# Patient Record
Sex: Female | Born: 1945 | Race: White | Hispanic: No | Marital: Married | Smoking: Never smoker
Health system: Southern US, Community
[De-identification: ages and names within clinical notes are randomized; demographics above are authoritative.]

## PROBLEM LIST (undated history)

## (undated) DIAGNOSIS — M51369 Other intervertebral disc degeneration, lumbar region without mention of lumbar back pain or lower extremity pain: Secondary | ICD-10-CM

## (undated) DIAGNOSIS — E7800 Pure hypercholesterolemia, unspecified: Secondary | ICD-10-CM

## (undated) DIAGNOSIS — Z973 Presence of spectacles and contact lenses: Secondary | ICD-10-CM

## (undated) DIAGNOSIS — E559 Vitamin D deficiency, unspecified: Secondary | ICD-10-CM

## (undated) DIAGNOSIS — K219 Gastro-esophageal reflux disease without esophagitis: Secondary | ICD-10-CM

## (undated) DIAGNOSIS — N819 Female genital prolapse, unspecified: Secondary | ICD-10-CM

## (undated) DIAGNOSIS — M5136 Other intervertebral disc degeneration, lumbar region: Secondary | ICD-10-CM

## (undated) DIAGNOSIS — I639 Cerebral infarction, unspecified: Secondary | ICD-10-CM

## (undated) DIAGNOSIS — J302 Other seasonal allergic rhinitis: Secondary | ICD-10-CM

## (undated) DIAGNOSIS — I10 Essential (primary) hypertension: Secondary | ICD-10-CM

## (undated) DIAGNOSIS — I499 Cardiac arrhythmia, unspecified: Secondary | ICD-10-CM

## (undated) DIAGNOSIS — Z7901 Long term (current) use of anticoagulants: Secondary | ICD-10-CM

## (undated) DIAGNOSIS — R0600 Dyspnea, unspecified: Secondary | ICD-10-CM

## (undated) DIAGNOSIS — M1990 Unspecified osteoarthritis, unspecified site: Secondary | ICD-10-CM

## (undated) DIAGNOSIS — M8580 Other specified disorders of bone density and structure, unspecified site: Secondary | ICD-10-CM

## (undated) DIAGNOSIS — I8390 Asymptomatic varicose veins of unspecified lower extremity: Secondary | ICD-10-CM

## (undated) DIAGNOSIS — E785 Hyperlipidemia, unspecified: Secondary | ICD-10-CM

## (undated) DIAGNOSIS — O039 Complete or unspecified spontaneous abortion without complication: Secondary | ICD-10-CM

## (undated) DIAGNOSIS — Z960 Presence of urogenital implants: Secondary | ICD-10-CM

## (undated) HISTORY — DX: Cardiac arrhythmia, unspecified: I49.9

## (undated) HISTORY — PX: DILATION AND CURETTAGE OF UTERUS: SHX78

## (undated) HISTORY — PX: HERNIA REPAIR: SHX51

---

## 1996-09-13 HISTORY — PX: DILATION AND CURETTAGE OF UTERUS: SHX78

## 2001-04-27 ENCOUNTER — Ambulatory Visit (HOSPITAL_COMMUNITY): Admission: RE | Admit: 2001-04-27 | Discharge: 2001-04-27 | Payer: Self-pay | Admitting: Family Medicine

## 2001-04-27 ENCOUNTER — Encounter: Payer: Self-pay | Admitting: Family Medicine

## 2006-11-02 ENCOUNTER — Inpatient Hospital Stay (HOSPITAL_COMMUNITY): Admission: EM | Admit: 2006-11-02 | Discharge: 2006-11-05 | Payer: Self-pay | Admitting: Pediatrics

## 2006-11-02 HISTORY — PX: FEMORAL HERNIA REPAIR: SHX632

## 2007-02-03 ENCOUNTER — Ambulatory Visit (HOSPITAL_BASED_OUTPATIENT_CLINIC_OR_DEPARTMENT_OTHER): Admission: RE | Admit: 2007-02-03 | Discharge: 2007-02-03 | Payer: Self-pay | Admitting: *Deleted

## 2007-02-03 HISTORY — PX: INGUINAL HERNIA REPAIR: SUR1180

## 2011-01-26 NOTE — Op Note (Signed)
Hannah Downs, Hannah Downs           ACCOUNT NO.:  [id number]   MEDICAL RECORD NO.:  [id number]          PATIENT TYPE:  AMB   LOCATION:  DSC                          FACILITY:  [facility]   PHYSICIAN:  Alfonse Ras, MD   DATE OF BIRTH:  11-10-1945   DATE OF PROCEDURE:  02/03/2007  DATE OF DISCHARGE:  02/03/2007                               OPERATIVE REPORT   PREOPERATIVE DIAGNOSIS:  Recurrent left inguinal hernia status post  primary repair.   POSTOPERATIVE DIAGNOSIS:  Recurrent left inguinal hernia status post  primary repair.   PROCEDURE:  Inguinal hernia repair with mesh.   ANESTHESIA:  General.   SURGEON:  Alfonse Ras, MD.   DESCRIPTION:  The patient was taken to the operating room, placed in a  supine position.  After adequate general anesthesia was induced using  endotracheal tube, the abdomen was prepped and draped in normal sterile  fashion.  Using an oblique incision over the left inguinal canal, I  dissected down onto a very very attenuated external oblique fascia.  This was opened along its fibers.  A direct inguinal hernia defect was  identified.  Transversalis fascia was also incredibly attenuated and was  difficult to do a primary repair but after a relaxing incision I was  able to primarily close this.  The hernia sac was not removed but the  contents were replaced in the abdominal cavity.  Primary repair was  accomplished with interrupted 0 Surgilon sutures.  A piece of 3 x 6  atrium mesh was then tacked with a running 2-0 Prolene suture.  Of  significant note, the tissues were very very thin.  This was run from  medially superiorly along the transversalis muscle and fascia and  brought out over the internal ring and along the inguinal ligament.  Adequate hemostasis was assured.  The external oblique fascia was closed  with a running 3-0 Vicryl suture which was quite poor purchases with the  suture because of the attenuation.  Skin was then closed with  staples.  Marcaine was injected in the tissues and muscle.  A sterile dressing was  applied.  The patient tolerated the procedure well and went to PACU in  good condition.      Alfonse Ras, MD  Electronically Signed     KRE/MEDQ  D:  02/09/2007  T:  02/09/2007  Job:  [id number]

## 2011-01-29 NOTE — H&P (Signed)
Hannah Downs, CRANMORE           ACCOUNT NO.:  [id number]   MEDICAL RECORD NO.:  [id number]          PATIENT TYPE:  INP   LOCATION:  5743                         FACILITY:  [facility]   PHYSICIAN:  Alfonse Ras, MD   DATE OF BIRTH:  09-06-1946   DATE OF ADMISSION:  11/02/2006  DATE OF DISCHARGE:                              HISTORY & PHYSICAL   ADMISSION DIAGNOSIS:  Small bowel obstruction secondary to incarcerated  left inguinal hernia.   CHIEF COMPLAINT:  The patient has had nausea and vomiting for about 5  days.  Has had a known left inguinal hernia, which has not been treated.   HOSPITAL COURSE:  The patient presented to the emergency room and was  found to have a white count of 18,000.  She underwent a CT scan, which  showed dilated loops of small bowel as well as left inguinal hernia.  Under conscious sedation, I tried to reduce this and was unsuccessful.  For this reason, the patient is being admitted for operative  intervention, left inguinal hernia repair, and possible small bowel  resection.   PAST MEDICAL HISTORY:  Significant only for hypertension and the birth  of 40 pregnancy and the birth of 81 children.   PAST SURGICAL HISTORY:  None.   MEDICATIONS:  Include only lisinopril at an unknown dose.   PHYSICAL EXAMINATION:  GENERAL:  She is age appropriate, white female in  minimal distress.  ABDOMEN:  Her abdomen is minimally distended and nontender.  She does  have a left inguinal hernia, which is easily seen.  HEENT:  Benign.  Normocephalic, atraumatic.  Pupils equal, round, and  reactive to light.  NECK:  Supple, soft.  No thyromegaly or cervical adenopathy.  LUNGS:  Clear to auscultation and percussion.  HEART:  Regular rate and rhythm without murmurs, rubs, or gallops.  EXTREMITIES:  Show no clubbing, cyanosis, or edema.  VITAL SIGNS:  Temperature is 98.7, heart rate was 105, respiratory rate  was 16, and blood pressure was 142/70.   IMPRESSION:   Incarcerated left inguinal hernia, unable to reduce.   PLAN:  Left inguinal hernia repair with possible small bowel resection.      Alfonse Ras, MD  Electronically Signed     KRE/MEDQ  D:  11/02/2006  T:  11/03/2006  Job:  [phone]

## 2011-01-29 NOTE — Op Note (Signed)
Hannah Downs, DENARDO           ACCOUNT NO.:  [id number]   MEDICAL RECORD NO.:  [id number]          PATIENT TYPE:  EMS   LOCATION:  MAJO                         FACILITY:  [facility]   PHYSICIAN:  Alfonse Ras, MD   DATE OF BIRTH:  September 19, 1945   DATE OF PROCEDURE:  11/02/2006  DATE OF DISCHARGE:                               OPERATIVE REPORT   PREOPERATIVE DIAGNOSIS:  Incarcerated inguinal hernia.   POSTOPERATIVE DIAGNOSIS:  Incarcerated left femoral hernia.   PROCEDURE:  Inguinal exploration and the primary repair of femoral  hernia, reduction of small bowel obstruction.  R.   ANESTHESIA:  General.   DESCRIPTION:  The patient was taken to the operating room, placed in the  supine position.  After adequate general anesthesia was induced using  endotracheal tube the abdomen was prepped and draped in normal sterile  fashion including the left inguinal region.  An oblique incision was  made over the inguinal canal.  I dissected down to the external oblique  fascia which was opened.  A fairly large incarcerated hernia sac was  identified.  On further dissection, the defect was very small only  allowing about a fingerbreadth and it was just medial to the femoral  vein consistent with a femoral hernia.  After extending the fascial  incision laterally and superiorly, I was able to inspect the small bowel  after removing the hernia sac and it was viable.  I reduced this into  the abdominal cavity and it was held in place with a sponge stick.  Fascia was then reapproximated down to Cooper's ligament starting at the  femoral vein and going towards the pubic tubercle.  This closed the  space well.  Because of the cloudy fluid within the hernia sac, I opted  not to place mesh.  The subcutaneous tissue was closed with a running 3-  0 Vicryl.  Skin incision was closed with staples.  The patient tolerated  the procedure well, went to PACU in good condition.      Alfonse Ras, MD  Electronically Signed     KRE/MEDQ  D:  11/02/2006  T:  11/02/2006  Job:  [id number]

## 2011-01-29 NOTE — Discharge Summary (Signed)
Hannah Downs, Hannah Downs           ACCOUNT NO.:  [id number]   MEDICAL RECORD NO.:  [id number]          PATIENT TYPE:  INP   LOCATION:  5743                         FACILITY:  [facility]   PHYSICIAN:  Alfonse Ras, MD   DATE OF BIRTH:  02-22-1946   DATE OF ADMISSION:  11/01/2006  DATE OF DISCHARGE:  11/05/2006                               DISCHARGE SUMMARY   CHIEF COMPLAINT/REASON FOR ADMISSION:  Mrs. Sferrazza is a 61-year female  patient with nausea and vomiting for 5 days, known left inguinal hernia.  She presented to the ER because of pain, nausea and vomiting and was  found to have a white count of 18,000.  CT scan was done which  demonstrated dilated loops of small bowel and a left inguinal hernia.  There was an attempt made to reduce this hernia under conscious sedation  in the ER but was unsuccessful.  The patient subsequently was admitted  for incarcerated inguinal hernia with plans to take to the OR on day of  admission for repair.   HOSPITAL COURSE:  The patient was taken from the ER to the OR by Dr.  Colin Benton for small bowel obstruction secondary to incarcerated inguinal  hernia.  Postoperative diagnosis was the same but she was actually found  to have a femoral hernia.  She underwent primary repair.  Bowel was  viable.  No bowel resection anticipated.   Postoperative day #1, the patient had an NG tube in place.  White count  had normalized from 18,500 now to 8400, hemoglobin was stable.  BUN and  creatinine was stable.  The patient was hungry and wanted her NG tube  out.  Her abdomen was soft.  Bowel sounds were present.  Left groin  dressing was clean, dry and intact.  NG tube with bilious drainage.  Her  NG tube was discontinued.  Her Foley was discontinued.  The Rocephin was  discontinued.  She had also been placed on empiric Unasyn.  She was  started on clear liquid diet to advance as tolerated with plans to begin  changing her over from PCA to oral pain medicines when  tolerating a  diet.   By postoperative day #2, the patient was stable.  She was passing a lot  of flatus but no BMs.  She tolerated a clear liquid diet.  Incision was  unremarkable.  A urine culture had been obtained at admission and this  was positive for greater than 100,000 colonies of Proteus mirabilis  sensitive to ampicillin, Rocephin and Cipro.  This patient was continued  on Unasyn and Cipro was added for UTI.  Her diet was advanced and plans  were to probably discharge the patient home on November 05, 2006.  On  that day, the patient was stable.  Wound was clean, dry and intact.  She  was tolerating a diet and deemed appropriate for discharge home.   FINAL DISCHARGE DIAGNOSES:  1. Small bowel obstruction secondary to incarcerated femoral hernia.  2. Primary repair of hernia not involving bowel resection.  3. Proteus urinary tract infection, pansensitive.   DISCHARGE MEDICATIONS:  1. Vicodin 5/325  1-2 tablets every 4 hours as needed for pain.  2. Cipro 500 mg b.i.d. for 6 days.  3. Resume home medications lisinopril 20/12.5 daily.   DIET:  Heart-healthy.   WOUND CARE:  Pat staples dry.   ACTIVITY:  Increase activity slowly.  May walk up steps.  May shower.  No lifting for 6 weeks greater than 15 pounds.  No driving while taking  Vicodin.   FOLLOW-UP APPOINTMENT:  Need to follow up with Dr. Colin Benton on Thursday,  November 10, 2006 at 10:30 a.m. for staple removal on postoperative  visit.      Allison L. Rolene Course      Alfonse Ras, MD  Electronically Signed    ALE/MEDQ  D:  12/16/2006  T:  12/17/2006  Job:  [id number]

## 2012-01-31 DIAGNOSIS — I10 Essential (primary) hypertension: Secondary | ICD-10-CM | POA: Diagnosis not present

## 2012-01-31 DIAGNOSIS — Z1331 Encounter for screening for depression: Secondary | ICD-10-CM | POA: Diagnosis not present

## 2012-01-31 DIAGNOSIS — K219 Gastro-esophageal reflux disease without esophagitis: Secondary | ICD-10-CM | POA: Diagnosis not present

## 2012-01-31 DIAGNOSIS — J309 Allergic rhinitis, unspecified: Secondary | ICD-10-CM | POA: Diagnosis not present

## 2012-10-03 DIAGNOSIS — K219 Gastro-esophageal reflux disease without esophagitis: Secondary | ICD-10-CM | POA: Diagnosis not present

## 2012-10-03 DIAGNOSIS — E785 Hyperlipidemia, unspecified: Secondary | ICD-10-CM | POA: Diagnosis not present

## 2012-10-03 DIAGNOSIS — I10 Essential (primary) hypertension: Secondary | ICD-10-CM | POA: Diagnosis not present

## 2012-10-03 DIAGNOSIS — J309 Allergic rhinitis, unspecified: Secondary | ICD-10-CM | POA: Diagnosis not present

## 2012-10-03 DIAGNOSIS — R7301 Impaired fasting glucose: Secondary | ICD-10-CM | POA: Diagnosis not present

## 2013-03-15 DIAGNOSIS — IMO0002 Reserved for concepts with insufficient information to code with codable children: Secondary | ICD-10-CM | POA: Diagnosis not present

## 2013-03-15 DIAGNOSIS — K5289 Other specified noninfective gastroenteritis and colitis: Secondary | ICD-10-CM | POA: Diagnosis not present

## 2013-04-16 DIAGNOSIS — K219 Gastro-esophageal reflux disease without esophagitis: Secondary | ICD-10-CM | POA: Diagnosis not present

## 2013-04-16 DIAGNOSIS — I10 Essential (primary) hypertension: Secondary | ICD-10-CM | POA: Diagnosis not present

## 2013-04-16 DIAGNOSIS — R7301 Impaired fasting glucose: Secondary | ICD-10-CM | POA: Diagnosis not present

## 2013-04-16 DIAGNOSIS — Z1331 Encounter for screening for depression: Secondary | ICD-10-CM | POA: Diagnosis not present

## 2013-11-27 DIAGNOSIS — K219 Gastro-esophageal reflux disease without esophagitis: Secondary | ICD-10-CM | POA: Diagnosis not present

## 2013-11-27 DIAGNOSIS — R7309 Other abnormal glucose: Secondary | ICD-10-CM | POA: Diagnosis not present

## 2013-11-27 DIAGNOSIS — M5137 Other intervertebral disc degeneration, lumbosacral region: Secondary | ICD-10-CM | POA: Diagnosis not present

## 2013-11-27 DIAGNOSIS — M25559 Pain in unspecified hip: Secondary | ICD-10-CM | POA: Diagnosis not present

## 2013-11-27 DIAGNOSIS — J309 Allergic rhinitis, unspecified: Secondary | ICD-10-CM | POA: Diagnosis not present

## 2013-11-27 DIAGNOSIS — I10 Essential (primary) hypertension: Secondary | ICD-10-CM | POA: Diagnosis not present

## 2013-11-28 ENCOUNTER — Other Ambulatory Visit: Payer: Self-pay | Admitting: Family Medicine

## 2013-11-28 ENCOUNTER — Ambulatory Visit
Admission: RE | Admit: 2013-11-28 | Discharge: 2013-11-28 | Disposition: A | Discharge status: Home / Self Care | Payer: Medicare Other | Source: Ambulatory Visit | Attending: Family Medicine | Admitting: Family Medicine

## 2013-11-28 DIAGNOSIS — M25559 Pain in unspecified hip: Secondary | ICD-10-CM

## 2013-11-28 DIAGNOSIS — M1610 Unilateral primary osteoarthritis, unspecified hip: Secondary | ICD-10-CM | POA: Diagnosis not present

## 2013-11-28 DIAGNOSIS — M169 Osteoarthritis of hip, unspecified: Secondary | ICD-10-CM | POA: Diagnosis not present

## 2014-01-03 DIAGNOSIS — M169 Osteoarthritis of hip, unspecified: Secondary | ICD-10-CM | POA: Diagnosis not present

## 2014-01-03 DIAGNOSIS — M1610 Unilateral primary osteoarthritis, unspecified hip: Secondary | ICD-10-CM | POA: Diagnosis not present

## 2014-03-07 DIAGNOSIS — M1610 Unilateral primary osteoarthritis, unspecified hip: Secondary | ICD-10-CM | POA: Diagnosis not present

## 2014-05-02 DIAGNOSIS — E559 Vitamin D deficiency, unspecified: Secondary | ICD-10-CM | POA: Diagnosis not present

## 2014-05-02 DIAGNOSIS — Z79899 Other long term (current) drug therapy: Secondary | ICD-10-CM | POA: Diagnosis not present

## 2014-05-02 DIAGNOSIS — Z1331 Encounter for screening for depression: Secondary | ICD-10-CM | POA: Diagnosis not present

## 2014-05-02 DIAGNOSIS — Z01810 Encounter for preprocedural cardiovascular examination: Secondary | ICD-10-CM | POA: Diagnosis not present

## 2014-05-02 DIAGNOSIS — K219 Gastro-esophageal reflux disease without esophagitis: Secondary | ICD-10-CM | POA: Diagnosis not present

## 2014-05-02 DIAGNOSIS — Z23 Encounter for immunization: Secondary | ICD-10-CM | POA: Diagnosis not present

## 2014-05-02 DIAGNOSIS — M1610 Unilateral primary osteoarthritis, unspecified hip: Secondary | ICD-10-CM | POA: Diagnosis not present

## 2014-05-02 DIAGNOSIS — Z01818 Encounter for other preprocedural examination: Secondary | ICD-10-CM | POA: Diagnosis not present

## 2014-05-02 DIAGNOSIS — R7309 Other abnormal glucose: Secondary | ICD-10-CM | POA: Diagnosis not present

## 2014-05-02 DIAGNOSIS — M169 Osteoarthritis of hip, unspecified: Secondary | ICD-10-CM | POA: Diagnosis not present

## 2014-05-02 DIAGNOSIS — I10 Essential (primary) hypertension: Secondary | ICD-10-CM | POA: Diagnosis not present

## 2014-05-30 ENCOUNTER — Other Ambulatory Visit: Payer: Self-pay | Admitting: Orthopedic Surgery

## 2014-05-30 NOTE — Progress Notes (Signed)
Preoperative surgical orders have been place into the Epic hospital system for Hannah Downs on 05/30/2014, 1:49 PM  by Patrica Duel for surgery on 06/26/2014.  Preop Total Hip - Anterior Approach orders including Experel Injecion, IV Tylenol, and IV Decadron as long as there are no contraindications to the above medications. Avel Peace, PA-C

## 2014-06-13 ENCOUNTER — Encounter (HOSPITAL_COMMUNITY): Payer: Self-pay | Admitting: Pharmacy Technician

## 2014-06-18 ENCOUNTER — Ambulatory Visit (HOSPITAL_COMMUNITY)
Admission: RE | Admit: 2014-06-18 | Discharge: 2014-06-18 | Disposition: A | Discharge status: Home / Self Care | Payer: Medicare Other | Source: Ambulatory Visit | Attending: Orthopedic Surgery | Admitting: Orthopedic Surgery

## 2014-06-18 ENCOUNTER — Encounter (HOSPITAL_COMMUNITY): Payer: Self-pay

## 2014-06-18 ENCOUNTER — Encounter (HOSPITAL_COMMUNITY)
Admission: RE | Admit: 2014-06-18 | Discharge: 2014-06-18 | Disposition: A | Discharge status: Home / Self Care | Payer: Medicare Other | Source: Ambulatory Visit | Attending: Orthopedic Surgery | Admitting: Orthopedic Surgery

## 2014-06-18 DIAGNOSIS — M160 Bilateral primary osteoarthritis of hip: Secondary | ICD-10-CM | POA: Diagnosis not present

## 2014-06-18 DIAGNOSIS — M1611 Unilateral primary osteoarthritis, right hip: Secondary | ICD-10-CM | POA: Diagnosis not present

## 2014-06-18 DIAGNOSIS — Z01818 Encounter for other preprocedural examination: Secondary | ICD-10-CM | POA: Insufficient documentation

## 2014-06-18 DIAGNOSIS — I10 Essential (primary) hypertension: Secondary | ICD-10-CM | POA: Diagnosis not present

## 2014-06-18 HISTORY — DX: Asymptomatic varicose veins of unspecified lower extremity: I83.90

## 2014-06-18 HISTORY — DX: Unspecified osteoarthritis, unspecified site: M19.90

## 2014-06-18 HISTORY — DX: Other intervertebral disc degeneration, lumbar region without mention of lumbar back pain or lower extremity pain: M51.369

## 2014-06-18 HISTORY — DX: Other seasonal allergic rhinitis: J30.2

## 2014-06-18 HISTORY — DX: Pure hypercholesterolemia, unspecified: E78.00

## 2014-06-18 HISTORY — DX: Other specified disorders of bone density and structure, unspecified site: M85.80

## 2014-06-18 HISTORY — DX: Other intervertebral disc degeneration, lumbar region: M51.36

## 2014-06-18 HISTORY — DX: Gastro-esophageal reflux disease without esophagitis: K21.9

## 2014-06-18 HISTORY — DX: Essential (primary) hypertension: I10

## 2014-06-18 HISTORY — DX: Vitamin D deficiency, unspecified: E55.9

## 2014-06-18 HISTORY — DX: Complete or unspecified spontaneous abortion without complication: O03.9

## 2014-06-18 LAB — COMPREHENSIVE METABOLIC PANEL
ALK PHOS: 59 U/L (ref 39–117)
ALT: 12 U/L (ref 0–35)
ANION GAP: 14 (ref 5–15)
AST: 20 U/L (ref 0–37)
Albumin: 4.6 g/dL (ref 3.5–5.2)
BILIRUBIN TOTAL: 1.5 mg/dL — AB (ref 0.3–1.2)
BUN: 16 mg/dL (ref 6–23)
CHLORIDE: 91 meq/L — AB (ref 96–112)
CO2: 28 mEq/L (ref 19–32)
Calcium: 10.2 mg/dL (ref 8.4–10.5)
Creatinine, Ser: 0.5 mg/dL (ref 0.50–1.10)
GFR calc Af Amer: >90 mL/min (ref >90)
GFR calc non Af Amer: >90 mL/min (ref >90)
GLUCOSE: 101 mg/dL — AB (ref 70–99)
POTASSIUM: 3.8 meq/L (ref 3.7–5.3)
Sodium: 133 mEq/L — ABNORMAL LOW (ref 137–147)
Total Protein: 7.8 g/dL (ref 6.0–8.3)

## 2014-06-18 LAB — CBC
HCT: 39 % (ref 36.0–46.0)
HEMOGLOBIN: 13.5 g/dL (ref 12.0–15.0)
MCH: 30.3 pg (ref 26.0–34.0)
MCHC: 34.6 g/dL (ref 30.0–36.0)
MCV: 87.4 fL (ref 78.0–100.0)
Platelets: 303 10*3/uL (ref 150–400)
RBC: 4.46 MIL/uL (ref 3.87–5.11)
RDW: 12.1 % (ref 11.5–15.5)
WBC: 7.5 10*3/uL (ref 4.0–10.5)

## 2014-06-18 LAB — URINALYSIS, ROUTINE W REFLEX MICROSCOPIC
Bilirubin Urine: NEGATIVE
Glucose, UA: NEGATIVE mg/dL
HGB URINE DIPSTICK: NEGATIVE
Ketones, ur: 15 mg/dL — AB
Nitrite: POSITIVE — AB
Protein, ur: NEGATIVE mg/dL
SPECIFIC GRAVITY, URINE: 1.021 (ref 1.005–1.030)
UROBILINOGEN UA: 1 mg/dL (ref 0.0–1.0)
pH: 6 (ref 5.0–8.0)

## 2014-06-18 LAB — URINE MICROSCOPIC-ADD ON

## 2014-06-18 LAB — SURGICAL PCR SCREEN
MRSA, PCR: NEGATIVE
STAPHYLOCOCCUS AUREUS: POSITIVE — AB

## 2014-06-18 LAB — PROTIME-INR
INR: 1.07 (ref 0.00–1.49)
Prothrombin Time: 14 seconds (ref 11.6–15.2)

## 2014-06-18 LAB — APTT: aPTT: 32 seconds (ref 24–37)

## 2014-06-18 LAB — ABO/RH: ABO/RH(D): O POS

## 2014-06-18 NOTE — Progress Notes (Signed)
EKG on chart from 05/02/2014  Clearance note on chart 05/02/2014 per Dr Cliffton AstersWhite

## 2014-06-18 NOTE — Progress Notes (Signed)
CMP results per PAT visit on 06/18/2014 / epic sent to Dr Lequita HaltAluisio

## 2014-06-18 NOTE — Patient Instructions (Addendum)
20 Hannah Downs  06/18/2014   Your procedure is scheduled on:      06/26/2014  Report to [facility] Main Entrance and follow signs to  Short Stay Center arrive at 11:00 AM.  Call this number if you have problems the morning of surgery [phone] or Presurgical Testing [phone].   Remember:  Do not eat food After Midnight but may have clear liquids till 0730 am day of surgery then nothing by mouth.    Take these medicines the morning of surgery with A SIP OF WATER: Pantoprazole                               You may not have any metal on your body including hair pins and piercings  Do not wear jewelry, make-up, lotions, powders, or deodorant.  Do not shave body hair  48 hours(2 days) of CHG soap use.               Do not bring valuables to the hospital. [facility] IS NOT RESPONSIBLE FOR VALUABLES.  Contacts, dentures or bridgework may not be worn into surgery.  Leave suitcase in the car. After surgery it may be brought to your room.  For patients admitted to the hospital, checkout time is 11:00 AM the day of discharge.     Special Instructions: review fact sheets for MRSA information, Blood Transfusion fact sheet, Incentive Spirometry.  Remember: Type/Screen "Blue armsbands"- may not be removed once applied(would result in being retested AM of surgery, if removed). ________________________________________________________________________  [facility] - Preparing for Surgery Before surgery, you can play an important role.  Because skin is not sterile, your skin needs to be as free of germs as possible.  You can reduce the number of germs on your skin by washing with CHG (chlorahexidine gluconate) soap before surgery.  CHG is an antiseptic cleaner which kills germs and bonds with the skin to continue killing germs even after washing. Please DO NOT use if you have an allergy to CHG or antibacterial soaps.  If your skin becomes reddened/irritated stop using the CHG and  inform your nurse when you arrive at Short Stay. Do not shave (including legs and underarms) for at least 48 hours prior to the first CHG shower.  You may shave your face/neck. Please follow these instructions carefully:  1.  Shower with CHG Soap the night before surgery and the  morning of Surgery.  2.  If you choose to wash your hair, wash your hair first as usual with your  normal  shampoo.  3.  After you shampoo, rinse your hair and body thoroughly to remove the  shampoo.                           4.  Use CHG as you would any other liquid soap.  You can apply chg directly  to the skin and wash                       Gently with a scrungie or clean washcloth.  5.  Apply the CHG Soap to your body ONLY FROM THE NECK DOWN.   Do not use on face/ open                           Wound or open sores. Avoid contact  with eyes, ears mouth and genitals (private parts).                       Wash face,  Genitals (private parts) with your normal soap.             6.  Wash thoroughly, paying special attention to the area where your surgery  will be performed.  7.  Thoroughly rinse your body with warm water from the neck down.  8.  DO NOT shower/wash with your normal soap after using and rinsing off  the CHG Soap.                9.  Pat yourself dry with a clean towel.            10.  Wear clean pajamas.            11.  Place clean sheets on your bed the night of your first shower and do not  sleep with pets. Day of Surgery : Do not apply any lotions/deodorants the morning of surgery.  Please wear clean clothes to the hospital/surgery center.  FAILURE TO FOLLOW THESE INSTRUCTIONS MAY RESULT IN THE CANCELLATION OF YOUR SURGERY PATIENT SIGNATURE_________________________________  NURSE SIGNATURE__________________________________  ________________________________________________________________________    CLEAR LIQUID DIET   Foods Allowed                                                                      Foods Excluded  Coffee and tea, regular and decaf                             liquids that you cannot  Plain Jell-O in any flavor                                             see through such as: Fruit ices (not with fruit pulp)                                     milk, soups, orange juice  Iced Popsicles                                    All solid food Carbonated beverages, regular and diet                                    Cranberry, grape and apple juices Sports drinks like Gatorade Lightly seasoned clear broth or consume(fat free) Sugar, honey syrup  Sample Menu Breakfast                                Lunch  Supper Cranberry juice                    Beef broth                            Chicken broth Jell-O                                     Grape juice                           Apple juice Coffee or tea                        Jell-O                                      Popsicle                                                Coffee or tea                        Coffee or tea  _____________________________________________________________________    Incentive Spirometer  An incentive spirometer is a tool that can help keep your lungs clear and active. This tool measures how well you are filling your lungs with each breath. Taking long deep breaths may help reverse or decrease the chance of developing breathing (pulmonary) problems (especially infection) following:  A long period of time when you are unable to move or be active. BEFORE THE PROCEDURE   If the spirometer includes an indicator to show your best effort, your nurse or respiratory therapist will set it to a desired goal.  If possible, sit up straight or lean slightly forward. Try not to slouch.  Hold the incentive spirometer in an upright position. INSTRUCTIONS FOR USE  1. Sit on the edge of your bed if possible, or sit up as far as you can in bed or on a chair. 2. Hold the  incentive spirometer in an upright position. 3. Breathe out normally. 4. Place the mouthpiece in your mouth and seal your lips tightly around it. 5. Breathe in slowly and as deeply as possible, raising the piston or the ball toward the top of the column. 6. Hold your breath for 3-5 seconds or for as long as possible. Allow the piston or ball to fall to the bottom of the column. 7. Remove the mouthpiece from your mouth and breathe out normally. 8. Rest for a few seconds and repeat Steps 1 through 7 at least 10 times every 1-2 hours when you are awake. Take your time and take a few normal breaths between deep breaths. 9. The spirometer may include an indicator to show your best effort. Use the indicator as a goal to work toward during each repetition. 10. After each set of 10 deep breaths, practice coughing to be sure your lungs are clear. If you have an incision (the cut made at the time of surgery), support your incision when coughing by placing a pillow or rolled up towels firmly against  it. Once you are able to get out of bed, walk around indoors and cough well. You may stop using the incentive spirometer when instructed by your caregiver.  RISKS AND COMPLICATIONS  Take your time so you do not get dizzy or light-headed.  If you are in pain, you may need to take or ask for pain medication before doing incentive spirometry. It is harder to take a deep breath if you are having pain. AFTER USE  Rest and breathe slowly and easily.  It can be helpful to keep track of a log of your progress. Your caregiver can provide you with a simple table to help with this. If you are using the spirometer at home, follow these instructions: SEEK MEDICAL CARE IF:   You are having difficultly using the spirometer.  You have trouble using the spirometer as often as instructed.  Your pain medication is not giving enough relief while using the spirometer.  You develop fever of 100.5 F (38.1 C) or  higher. SEEK IMMEDIATE MEDICAL CARE IF:   You cough up bloody sputum that had not been present before.  You develop fever of 102 F (38.9 C) or greater.  You develop worsening pain at or near the incision site. MAKE SURE YOU:   Understand these instructions.  Will watch your condition.  Will get help right away if you are not doing well or get worse. Document Released: 01/10/2007 Document Revised: 11/22/2011 Document Reviewed: 03/13/2007 ExitCare Patient Information 2014 ExitCare, [place].   ________________________________________________________________________  WHAT IS A BLOOD TRANSFUSION? Blood Transfusion Information  A transfusion is the replacement of blood or some of its parts. Blood is made up of multiple cells which provide different functions.  Red blood cells carry oxygen and are used for blood loss replacement.  White blood cells fight against infection.  Platelets control bleeding.  Plasma helps clot blood.  Other blood products are available for specialized needs, such as hemophilia or other clotting disorders. BEFORE THE TRANSFUSION  Who gives blood for transfusions?   Healthy volunteers who are fully evaluated to make sure their blood is safe. This is blood bank blood. Transfusion therapy is the safest it has ever been in the practice of medicine. Before blood is taken from a donor, a complete history is taken to make sure that person has no history of diseases nor engages in risky social behavior (examples are intravenous drug use or sexual activity with multiple partners). The donor's travel history is screened to minimize risk of transmitting infections, such as malaria. The donated blood is tested for signs of infectious diseases, such as HIV and hepatitis. The blood is then tested to be sure it is compatible with you in order to minimize the chance of a transfusion reaction. If you or a relative donates blood, this is often done in anticipation of surgery  and is not appropriate for emergency situations. It takes many days to process the donated blood. RISKS AND COMPLICATIONS Although transfusion therapy is very safe and saves many lives, the main dangers of transfusion include:   Getting an infectious disease.  Developing a transfusion reaction. This is an allergic reaction to something in the blood you were given. Every precaution is taken to prevent this. The decision to have a blood transfusion has been considered carefully by your caregiver before blood is given. Blood is not given unless the benefits outweigh the risks. AFTER THE TRANSFUSION  Right after receiving a blood transfusion, you will usually feel much better and more  energetic. This is especially true if your red blood cells have gotten low (anemic). The transfusion raises the level of the red blood cells which carry oxygen, and this usually causes an energy increase.  The nurse administering the transfusion will monitor you carefully for complications. HOME CARE INSTRUCTIONS  No special instructions are needed after a transfusion. You may find your energy is better. Speak with your caregiver about any limitations on activity for underlying diseases you may have. SEEK MEDICAL CARE IF:   Your condition is not improving after your transfusion.  You develop redness or irritation at the intravenous (IV) site. SEEK IMMEDIATE MEDICAL CARE IF:  Any of the following symptoms occur over the next 12 hours:  Shaking chills.  You have a temperature by mouth above 102 F (38.9 C), not controlled by medicine.  Chest, back, or muscle pain.  People around you feel you are not acting correctly or are confused.  Shortness of breath or difficulty breathing.  Dizziness and fainting.  You get a rash or develop hives.  You have a decrease in urine output.  Your urine turns a dark color or changes to pink, red, or brown. Any of the following symptoms occur over the next 10  days:  You have a temperature by mouth above 102 F (38.9 C), not controlled by medicine.  Shortness of breath.  Weakness after normal activity.  The white part of the eye turns yellow (jaundice).  You have a decrease in the amount of urine or are urinating less often.  Your urine turns a dark color or changes to pink, red, or brown. Document Released: 08/27/2000 Document Revised: 11/22/2011 Document Reviewed: 04/15/2008 [facility] Patient Information 2014 [place], [place].  _______________________________________________________________________

## 2014-06-18 NOTE — Progress Notes (Signed)
Urinalysis and micro results per PAT visit on 06/18/2014 / epic sent to Dr Lequita HaltAluisio

## 2014-06-25 ENCOUNTER — Other Ambulatory Visit: Payer: Self-pay | Admitting: Surgical

## 2014-06-25 NOTE — H&P (Signed)
TOTAL HIP ADMISSION H&P  Patient is admitted for right total hip arthroplasty.  Subjective:  Chief Complaint: right hip pain  HPI: Hannah Downs, 68 y.o. female, has a history of pain and functional disability in the right hip(s) due to arthritis and patient has failed non-surgical conservative treatments for greater than 12 weeks to include NSAID's and/or analgesics, corticosteriod injections, flexibility and strengthening excercises, use of assistive devices and activity modification.  Onset of symptoms was gradual starting 2 years ago with gradually worsening course since that time.The patient noted no past surgery on the right hip(s).  Patient currently rates pain in the right hip at 8 out of 10 with activity. Patient has night pain, worsening of pain with activity and weight bearing, pain that interfers with activities of daily living, pain with passive range of motion and crepitus. 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 by imaging studies. This condition presents safety issues increasing the risk of falls.  There is no current active infection.   Past Medical History  Diagnosis Date  . Hypertension   . GERD (gastroesophageal reflux disease)   . Arthritis   . Varicose vein     right leg  . Miscarriage     times 3  . Osteopenia   . Vitamin D deficiency   . Lumbar degenerative disc disease   . Hypercholesterolemia   . Seasonal allergies     Past Surgical History  Procedure Laterality Date  . Dilation and curettage of uterus      hx of approx 26 years ago  . Hernia repair      hx of in left groin times 2    Current outpatient prescriptions: atenolol-chlorthalidone (TENORETIC) 50-25 MG per tablet, Take 1 tablet by mouth every morning., Disp: , Rfl: ;   cetirizine (ZYRTEC) 10 MG tablet, Take 10 mg by mouth at bedtime., Disp: , Rfl: ;   cholecalciferol (VITAMIN D) 1000  UNITS tablet, Take 2,000 Units by mouth 2 (two) times daily., Disp: , Rfl: ;   ibuprofen (ADVIL,MOTRIN) 200 MG tablet, Take 600 mg by mouth 3 (three) times daily., Disp: , Rfl:  montelukast (SINGULAIR) 10 MG tablet, Take 10 mg by mouth at bedtime., Disp: , Rfl: ;   pantoprazole (PROTONIX) 40 MG tablet, Take 40 mg by mouth 2 (two) times daily., Disp: , Rfl:   No Known Allergies  History  Substance Use Topics  . Smoking status: Never Smoker   . Smokeless tobacco: Never Used  . Alcohol Use: No      Review of Systems  Constitutional: Negative.   HENT: Negative.   Eyes: Negative.   Respiratory: Negative.   Cardiovascular: Negative.   Gastrointestinal: Negative.   Genitourinary: Negative.   Musculoskeletal: Positive for back pain, joint pain and myalgias. Negative for falls and neck pain.       Right hip pain  Skin: Negative.   Neurological: Negative.   Endo/Heme/Allergies: Negative.   Psychiatric/Behavioral: Negative.     Objective:  Physical Exam  Constitutional: She is oriented to person, place, and time. She appears well-developed and well-nourished. No distress.  HENT:  Head: Normocephalic and atraumatic.  Right Ear: External ear normal.  Left Ear: External ear normal.  Nose: Nose normal.  Mouth/Throat: Oropharynx is clear and moist.  Eyes: Conjunctivae and EOM are normal.  Neck: Normal range of motion. Neck supple.  Cardiovascular: Normal rate, regular rhythm, normal heart sounds and intact  distal pulses.   No murmur heard. Respiratory: Effort normal and breath sounds normal. No respiratory distress. She has no wheezes.  GI: Soft. Bowel sounds are normal.  Musculoskeletal:       Right hip: She exhibits decreased range of motion, decreased strength and crepitus.       Left hip: Normal.       Right knee: Normal.       Left knee: Normal.       Right lower leg: She exhibits no tenderness and no swelling.       Left lower leg: She exhibits no tenderness and no swelling.   Her right hip can be flexed to about 90 minimal internal rotation, to about 20 external rotation, and 20 abduction. She has no tenderness about the hip. Her left hip has normal range of motion. Knee exam is unremarkable. Significant antalgic gait.  Neurological: She is alert and oriented to person, place, and time. She has normal strength and normal reflexes. No sensory deficit.  Skin: No rash noted. She is not diaphoretic. No erythema.  Psychiatric: She has a normal mood and affect. Her behavior is normal.    Vitals  Weight: 125 lb Height: 62.5in Body Surface Area: 1.58 m Body Mass Index: 22.5 kg/m Pulse: 64 (Regular)  BP: 132/80 (Sitting, Left Arm, Standard)  Imaging Review Plain radiographs demonstrate severe degenerative joint disease of the right hip(s). The bone quality appears to be good for age and reported activity level.  Assessment/Plan:  End stage arthritis, right hip(s)  The patient history, physical examination, clinical judgement of the provider and imaging studies are consistent with end stage degenerative joint disease of the right hip(s) and total hip arthroplasty is deemed medically necessary. The treatment options including medical management, injection therapy, arthroscopy and arthroplasty were discussed at length. The risks and benefits of total hip arthroplasty were presented and reviewed. The risks due to aseptic loosening, infection, stiffness, dislocation/subluxation,  thromboembolic complications and other imponderables were discussed.  The patient acknowledged the explanation, agreed to proceed with the plan and consent was signed. Patient is being admitted for inpatient treatment for surgery, pain control, PT, OT, prophylactic antibiotics, VTE prophylaxis, progressive ambulation and ADL's and discharge planning.The patient is planning to be discharged home with home health services   PCP: Dr. Laurann Montanaynthia White  TXA IV  Patient request Algernon HuxleyGlen as  CRNA     Dimitri PedAmber Jowell Bossi, PA-C

## 2014-06-26 ENCOUNTER — Encounter (HOSPITAL_COMMUNITY)
Admission: RE | Disposition: A | Discharge status: Home Health | Payer: Self-pay | Source: Ambulatory Visit | Attending: Orthopedic Surgery

## 2014-06-26 ENCOUNTER — Encounter (HOSPITAL_COMMUNITY): Payer: Self-pay | Admitting: *Deleted

## 2014-06-26 ENCOUNTER — Inpatient Hospital Stay (HOSPITAL_COMMUNITY): Payer: Medicare Other

## 2014-06-26 ENCOUNTER — Inpatient Hospital Stay (HOSPITAL_COMMUNITY)
Admission: RE | Admit: 2014-06-26 | Discharge: 2014-06-27 | DRG: 470 | Disposition: A | Discharge status: Home Health | Payer: Medicare Other | Source: Ambulatory Visit | Attending: Orthopedic Surgery | Admitting: Orthopedic Surgery

## 2014-06-26 ENCOUNTER — Inpatient Hospital Stay (HOSPITAL_COMMUNITY): Payer: Medicare Other | Admitting: *Deleted

## 2014-06-26 ENCOUNTER — Encounter (HOSPITAL_COMMUNITY): Payer: Medicare Other | Admitting: *Deleted

## 2014-06-26 DIAGNOSIS — Z96641 Presence of right artificial hip joint: Secondary | ICD-10-CM | POA: Diagnosis not present

## 2014-06-26 DIAGNOSIS — E559 Vitamin D deficiency, unspecified: Secondary | ICD-10-CM | POA: Diagnosis present

## 2014-06-26 DIAGNOSIS — E780 Pure hypercholesterolemia: Secondary | ICD-10-CM | POA: Diagnosis present

## 2014-06-26 DIAGNOSIS — Z6822 Body mass index (BMI) 22.0-22.9, adult: Secondary | ICD-10-CM | POA: Diagnosis not present

## 2014-06-26 DIAGNOSIS — Z9889 Other specified postprocedural states: Secondary | ICD-10-CM | POA: Diagnosis not present

## 2014-06-26 DIAGNOSIS — I10 Essential (primary) hypertension: Secondary | ICD-10-CM | POA: Diagnosis present

## 2014-06-26 DIAGNOSIS — M1611 Unilateral primary osteoarthritis, right hip: Secondary | ICD-10-CM | POA: Diagnosis not present

## 2014-06-26 DIAGNOSIS — M247 Protrusio acetabuli: Secondary | ICD-10-CM | POA: Diagnosis present

## 2014-06-26 DIAGNOSIS — K219 Gastro-esophageal reflux disease without esophagitis: Secondary | ICD-10-CM | POA: Diagnosis present

## 2014-06-26 DIAGNOSIS — M1990 Unspecified osteoarthritis, unspecified site: Secondary | ICD-10-CM | POA: Diagnosis not present

## 2014-06-26 DIAGNOSIS — M25551 Pain in right hip: Secondary | ICD-10-CM | POA: Diagnosis not present

## 2014-06-26 DIAGNOSIS — M169 Osteoarthritis of hip, unspecified: Secondary | ICD-10-CM | POA: Diagnosis present

## 2014-06-26 HISTORY — PX: TOTAL HIP ARTHROPLASTY: SHX124

## 2014-06-26 LAB — TYPE AND SCREEN
ABO/RH(D): O POS
ANTIBODY SCREEN: NEGATIVE

## 2014-06-26 SURGERY — ARTHROPLASTY, HIP, TOTAL, ANTERIOR APPROACH
Anesthesia: Spinal | Site: Hip | Laterality: Right

## 2014-06-26 MED ORDER — PROPOFOL 10 MG/ML IV BOLUS
INTRAVENOUS | Status: DC | PRN
  Administered 2014-06-26: 10 mg via INTRAVENOUS
  Administered 2014-06-26: 20 mg via INTRAVENOUS
  Administered 2014-06-26: 40 mg via INTRAVENOUS
  Administered 2014-06-26 (×4): 20 mg via INTRAVENOUS

## 2014-06-26 MED ORDER — PROPOFOL 10 MG/ML IV BOLUS
INTRAVENOUS | Status: AC
  Filled 2014-06-26: qty 20

## 2014-06-26 MED ORDER — HYDROMORPHONE HCL 1 MG/ML IJ SOLN: 0.2500 mg | INTRAMUSCULAR | Status: DC | PRN

## 2014-06-26 MED ORDER — LACTATED RINGERS IV SOLN: INTRAVENOUS | Status: DC

## 2014-06-26 MED ORDER — ONDANSETRON HCL 4 MG/2ML IJ SOLN: 4.0000 mg | Freq: Four times a day (QID) | INTRAMUSCULAR | Status: DC | PRN

## 2014-06-26 MED ORDER — CEFAZOLIN SODIUM-DEXTROSE 2-3 GM-% IV SOLR
INTRAVENOUS | Status: AC
  Filled 2014-06-26: qty 50

## 2014-06-26 MED ORDER — SODIUM CHLORIDE 0.9 % IJ SOLN
INTRAMUSCULAR | Status: AC
  Filled 2014-06-26: qty 50

## 2014-06-26 MED ORDER — OXYCODONE HCL 5 MG PO TABS
5.0000 mg | ORAL_TABLET | ORAL | Status: DC | PRN
  Administered 2014-06-26: 5 mg via ORAL
  Filled 2014-06-26: qty 1

## 2014-06-26 MED ORDER — BISACODYL 10 MG RE SUPP: 10.0000 mg | Freq: Every day | RECTAL | Status: DC | PRN

## 2014-06-26 MED ORDER — BUPIVACAINE LIPOSOME 1.3 % IJ SUSP
20.0000 mL | Freq: Once | INTRAMUSCULAR | Status: DC
  Filled 2014-06-26: qty 20

## 2014-06-26 MED ORDER — ACETAMINOPHEN 650 MG RE SUPP: 650.0000 mg | Freq: Four times a day (QID) | RECTAL | Status: DC | PRN

## 2014-06-26 MED ORDER — POLYETHYLENE GLYCOL 3350 17 G PO PACK: 17.0000 g | PACK | Freq: Every day | ORAL | Status: DC | PRN

## 2014-06-26 MED ORDER — ATENOLOL 50 MG PO TABS
50.0000 mg | ORAL_TABLET | Freq: Every day | ORAL | Status: DC
  Administered 2014-06-27: 50 mg via ORAL
  Filled 2014-06-26 (×2): qty 1

## 2014-06-26 MED ORDER — LIDOCAINE HCL (CARDIAC) 20 MG/ML IV SOLN
INTRAVENOUS | Status: AC
  Filled 2014-06-26: qty 5

## 2014-06-26 MED ORDER — ACETAMINOPHEN 325 MG PO TABS: 650.0000 mg | ORAL_TABLET | Freq: Four times a day (QID) | ORAL | Status: DC | PRN

## 2014-06-26 MED ORDER — DEXAMETHASONE SODIUM PHOSPHATE 10 MG/ML IJ SOLN
INTRAMUSCULAR | Status: DC | PRN
  Administered 2014-06-26: 10 mg via INTRAVENOUS

## 2014-06-26 MED ORDER — METHOCARBAMOL 500 MG PO TABS
500.0000 mg | ORAL_TABLET | Freq: Four times a day (QID) | ORAL | Status: DC | PRN
  Administered 2014-06-27: 500 mg via ORAL
  Filled 2014-06-26: qty 1

## 2014-06-26 MED ORDER — DEXAMETHASONE SODIUM PHOSPHATE 10 MG/ML IJ SOLN
INTRAMUSCULAR | Status: AC
  Filled 2014-06-26: qty 1

## 2014-06-26 MED ORDER — CHLORTHALIDONE 25 MG PO TABS
25.0000 mg | ORAL_TABLET | Freq: Every day | ORAL | Status: DC
  Administered 2014-06-27: 25 mg via ORAL
  Filled 2014-06-26 (×2): qty 1

## 2014-06-26 MED ORDER — ACETAMINOPHEN 10 MG/ML IV SOLN
1000.0000 mg | Freq: Once | INTRAVENOUS | Status: AC
  Administered 2014-06-26: 1000 mg via INTRAVENOUS
  Filled 2014-06-26: qty 100

## 2014-06-26 MED ORDER — BUPIVACAINE LIPOSOME 1.3 % IJ SUSP
INTRAMUSCULAR | Status: DC | PRN
  Administered 2014-06-26: 20 mL

## 2014-06-26 MED ORDER — 0.9 % SODIUM CHLORIDE (POUR BTL) OPTIME
TOPICAL | Status: DC | PRN
  Administered 2014-06-26: 1000 mL

## 2014-06-26 MED ORDER — CEFAZOLIN SODIUM-DEXTROSE 2-3 GM-% IV SOLR
2.0000 g | INTRAVENOUS | Status: AC
  Administered 2014-06-26: 2 g via INTRAVENOUS

## 2014-06-26 MED ORDER — METOCLOPRAMIDE HCL 5 MG/ML IJ SOLN: 5.0000 mg | Freq: Three times a day (TID) | INTRAMUSCULAR | Status: DC | PRN

## 2014-06-26 MED ORDER — PROPOFOL INFUSION 10 MG/ML OPTIME
INTRAVENOUS | Status: DC | PRN
  Administered 2014-06-26: 75 ug/kg/min via INTRAVENOUS

## 2014-06-26 MED ORDER — PANTOPRAZOLE SODIUM 40 MG PO TBEC
40.0000 mg | DELAYED_RELEASE_TABLET | Freq: Two times a day (BID) | ORAL | Status: DC
  Administered 2014-06-26 – 2014-06-27 (×2): 40 mg via ORAL
  Filled 2014-06-26 (×3): qty 1

## 2014-06-26 MED ORDER — DEXAMETHASONE SODIUM PHOSPHATE 10 MG/ML IJ SOLN
10.0000 mg | Freq: Once | INTRAMUSCULAR | Status: AC
  Administered 2014-06-27: 10 mg via INTRAVENOUS
  Filled 2014-06-26: qty 1

## 2014-06-26 MED ORDER — LORATADINE 10 MG PO TABS
10.0000 mg | ORAL_TABLET | Freq: Every day | ORAL | Status: DC
  Administered 2014-06-26 – 2014-06-27 (×2): 10 mg via ORAL
  Filled 2014-06-26 (×2): qty 1

## 2014-06-26 MED ORDER — DEXAMETHASONE SODIUM PHOSPHATE 10 MG/ML IJ SOLN: 10.0000 mg | Freq: Once | INTRAMUSCULAR | Status: DC

## 2014-06-26 MED ORDER — ATENOLOL 50 MG PO TABS
50.0000 mg | ORAL_TABLET | Freq: Once | ORAL | Status: AC
  Administered 2014-06-26: 50 mg via ORAL
  Filled 2014-06-26: qty 1

## 2014-06-26 MED ORDER — METOCLOPRAMIDE HCL 10 MG PO TABS: 5.0000 mg | ORAL_TABLET | Freq: Three times a day (TID) | ORAL | Status: DC | PRN

## 2014-06-26 MED ORDER — BUPIVACAINE HCL (PF) 0.25 % IJ SOLN
INTRAMUSCULAR | Status: AC
  Filled 2014-06-26: qty 30

## 2014-06-26 MED ORDER — BUPIVACAINE HCL (PF) 0.5 % IJ SOLN
INTRAMUSCULAR | Status: DC | PRN
  Administered 2014-06-26: 3 mL

## 2014-06-26 MED ORDER — MEPERIDINE HCL 50 MG/ML IJ SOLN: 6.2500 mg | INTRAMUSCULAR | Status: DC | PRN

## 2014-06-26 MED ORDER — PROMETHAZINE HCL 25 MG/ML IJ SOLN: 6.2500 mg | INTRAMUSCULAR | Status: DC | PRN

## 2014-06-26 MED ORDER — TRANEXAMIC ACID 100 MG/ML IV SOLN
1000.0000 mg | INTRAVENOUS | Status: AC
  Administered 2014-06-26: 1000 mg via INTRAVENOUS
  Filled 2014-06-26: qty 10

## 2014-06-26 MED ORDER — FLEET ENEMA 7-19 GM/118ML RE ENEM: 1.0000 | ENEMA | Freq: Once | RECTAL | Status: AC | PRN

## 2014-06-26 MED ORDER — PHENOL 1.4 % MT LIQD: 1.0000 | OROMUCOSAL | Status: DC | PRN

## 2014-06-26 MED ORDER — ONDANSETRON HCL 4 MG PO TABS: 4.0000 mg | ORAL_TABLET | Freq: Four times a day (QID) | ORAL | Status: DC | PRN

## 2014-06-26 MED ORDER — ACETAMINOPHEN 500 MG PO TABS
1000.0000 mg | ORAL_TABLET | Freq: Four times a day (QID) | ORAL | Status: AC
  Administered 2014-06-26 – 2014-06-27 (×4): 1000 mg via ORAL
  Filled 2014-06-26 (×4): qty 2

## 2014-06-26 MED ORDER — DIPHENHYDRAMINE HCL 12.5 MG/5ML PO ELIX: 12.5000 mg | ORAL_SOLUTION | ORAL | Status: DC | PRN

## 2014-06-26 MED ORDER — DOCUSATE SODIUM 100 MG PO CAPS
100.0000 mg | ORAL_CAPSULE | Freq: Two times a day (BID) | ORAL | Status: DC
  Administered 2014-06-26: 100 mg via ORAL

## 2014-06-26 MED ORDER — ONDANSETRON HCL 4 MG/2ML IJ SOLN
INTRAMUSCULAR | Status: DC | PRN
  Administered 2014-06-26: 4 mg via INTRAVENOUS

## 2014-06-26 MED ORDER — POTASSIUM CHLORIDE IN NACL 20-0.9 MEQ/L-% IV SOLN
INTRAVENOUS | Status: DC
  Administered 2014-06-26: 19:00:00 via INTRAVENOUS
  Filled 2014-06-26 (×4): qty 1000

## 2014-06-26 MED ORDER — ATENOLOL-CHLORTHALIDONE 50-25 MG PO TABS: 1.0000 | ORAL_TABLET | ORAL | Status: DC

## 2014-06-26 MED ORDER — OXYCODONE HCL 5 MG PO TABS: 5.0000 mg | ORAL_TABLET | Freq: Once | ORAL | Status: DC | PRN

## 2014-06-26 MED ORDER — BUPIVACAINE HCL (PF) 0.25 % IJ SOLN
INTRAMUSCULAR | Status: DC | PRN
  Administered 2014-06-26: 20 mL

## 2014-06-26 MED ORDER — MORPHINE SULFATE 2 MG/ML IJ SOLN: 1.0000 mg | INTRAMUSCULAR | Status: DC | PRN

## 2014-06-26 MED ORDER — SODIUM CHLORIDE 0.9 % IJ SOLN
INTRAMUSCULAR | Status: DC | PRN
  Administered 2014-06-26: 30 mL

## 2014-06-26 MED ORDER — OXYCODONE HCL 5 MG/5ML PO SOLN: 5.0000 mg | Freq: Once | ORAL | Status: DC | PRN

## 2014-06-26 MED ORDER — KETOROLAC TROMETHAMINE 15 MG/ML IJ SOLN: 7.5000 mg | Freq: Four times a day (QID) | INTRAMUSCULAR | Status: AC | PRN

## 2014-06-26 MED ORDER — PHENYLEPHRINE HCL 10 MG/ML IJ SOLN
INTRAMUSCULAR | Status: DC | PRN
  Administered 2014-06-26 (×3): 40 ug via INTRAVENOUS

## 2014-06-26 MED ORDER — MONTELUKAST SODIUM 10 MG PO TABS
10.0000 mg | ORAL_TABLET | Freq: Every day | ORAL | Status: DC
  Administered 2014-06-26: 10 mg via ORAL
  Filled 2014-06-26 (×2): qty 1

## 2014-06-26 MED ORDER — PHENYLEPHRINE HCL 10 MG/ML IJ SOLN
10.0000 mg | INTRAMUSCULAR | Status: DC | PRN
  Administered 2014-06-26: 40 ug/min via INTRAVENOUS

## 2014-06-26 MED ORDER — CEFAZOLIN SODIUM-DEXTROSE 2-3 GM-% IV SOLR
2.0000 g | Freq: Four times a day (QID) | INTRAVENOUS | Status: AC
  Administered 2014-06-26 – 2014-06-27 (×2): 2 g via INTRAVENOUS
  Filled 2014-06-26 (×2): qty 50

## 2014-06-26 MED ORDER — METHOCARBAMOL 1000 MG/10ML IJ SOLN
500.0000 mg | Freq: Four times a day (QID) | INTRAVENOUS | Status: DC | PRN
  Filled 2014-06-26: qty 5

## 2014-06-26 MED ORDER — SODIUM CHLORIDE 0.9 % IV SOLN: INTRAVENOUS | Status: DC

## 2014-06-26 MED ORDER — TRAMADOL HCL 50 MG PO TABS: 50.0000 mg | ORAL_TABLET | Freq: Four times a day (QID) | ORAL | Status: DC | PRN

## 2014-06-26 MED ORDER — LACTATED RINGERS IV SOLN
INTRAVENOUS | Status: DC | PRN
  Administered 2014-06-26 (×2): via INTRAVENOUS

## 2014-06-26 MED ORDER — MIDAZOLAM HCL 2 MG/2ML IJ SOLN
INTRAMUSCULAR | Status: AC
  Filled 2014-06-26: qty 2

## 2014-06-26 MED ORDER — MIDAZOLAM HCL 5 MG/5ML IJ SOLN
INTRAMUSCULAR | Status: DC | PRN
  Administered 2014-06-26: 50 mg via INTRAVENOUS
  Administered 2014-06-26: 2 mg via INTRAVENOUS

## 2014-06-26 MED ORDER — FENTANYL CITRATE 0.05 MG/ML IJ SOLN
INTRAMUSCULAR | Status: AC
  Filled 2014-06-26: qty 2

## 2014-06-26 MED ORDER — MENTHOL 3 MG MT LOZG: 1.0000 | LOZENGE | OROMUCOSAL | Status: DC | PRN

## 2014-06-26 MED ORDER — CHLORHEXIDINE GLUCONATE 4 % EX LIQD: 60.0000 mL | Freq: Once | CUTANEOUS | Status: DC

## 2014-06-26 MED ORDER — RIVAROXABAN 10 MG PO TABS
10.0000 mg | ORAL_TABLET | Freq: Every day | ORAL | Status: DC
  Administered 2014-06-27: 10 mg via ORAL
  Filled 2014-06-26 (×2): qty 1

## 2014-06-26 SURGICAL SUPPLY — 40 items
BAG SPEC THK2 15X12 ZIP CLS (MISCELLANEOUS)
BAG ZIPLOCK 12X15 (MISCELLANEOUS) IMPLANT
BLADE EXTENDED COATED 6.5IN (ELECTRODE) ×3 IMPLANT
BLADE SAG 18X100X1.27 (BLADE) ×3 IMPLANT
CAPT HIP PF COP ×2 IMPLANT
CLOSURE WOUND 1/2 X4 (GAUZE/BANDAGES/DRESSINGS) ×1
COVER PERINEAL POST (MISCELLANEOUS) ×3 IMPLANT
DECANTER SPIKE VIAL GLASS SM (MISCELLANEOUS) ×3 IMPLANT
DRAPE C-ARM 42X120 X-RAY (DRAPES) ×3 IMPLANT
DRAPE STERI IOBAN 125X83 (DRAPES) ×3 IMPLANT
DRAPE U-SHAPE 47X51 STRL (DRAPES) ×9 IMPLANT
DRSG ADAPTIC 3X8 NADH LF (GAUZE/BANDAGES/DRESSINGS) ×3 IMPLANT
DRSG MEPILEX BORDER 4X4 (GAUZE/BANDAGES/DRESSINGS) ×3 IMPLANT
DRSG MEPILEX BORDER 4X8 (GAUZE/BANDAGES/DRESSINGS) ×3 IMPLANT
DURAPREP 26ML APPLICATOR (WOUND CARE) ×3 IMPLANT
ELECT REM PT RETURN 9FT ADLT (ELECTROSURGICAL) ×3
ELECTRODE REM PT RTRN 9FT ADLT (ELECTROSURGICAL) ×1 IMPLANT
EVACUATOR 1/8 PVC DRAIN (DRAIN) ×3 IMPLANT
FACESHIELD WRAPAROUND (MASK) ×12 IMPLANT
FACESHIELD WRAPAROUND OR TEAM (MASK) ×4 IMPLANT
GLOVE BIO SURGEON STRL SZ7.5 (GLOVE) ×3 IMPLANT
GLOVE BIO SURGEON STRL SZ8 (GLOVE) ×6 IMPLANT
GLOVE BIOGEL PI IND STRL 8 (GLOVE) ×2 IMPLANT
GLOVE BIOGEL PI INDICATOR 8 (GLOVE) ×4
GOWN STRL REUS W/TWL LRG LVL3 (GOWN DISPOSABLE) ×3 IMPLANT
GOWN STRL REUS W/TWL XL LVL3 (GOWN DISPOSABLE) ×3 IMPLANT
KIT BASIN OR (CUSTOM PROCEDURE TRAY) ×3 IMPLANT
NDL SAFETY ECLIPSE 18X1.5 (NEEDLE) ×2 IMPLANT
NEEDLE HYPO 18GX1.5 SHARP (NEEDLE) ×6
PACK TOTAL JOINT (CUSTOM PROCEDURE TRAY) ×3 IMPLANT
STRIP CLOSURE SKIN 1/2X4 (GAUZE/BANDAGES/DRESSINGS) ×2 IMPLANT
SUT ETHIBOND NAB CT1 #1 30IN (SUTURE) ×3 IMPLANT
SUT MNCRL AB 4-0 PS2 18 (SUTURE) ×3 IMPLANT
SUT VIC AB 2-0 CT1 27 (SUTURE) ×6
SUT VIC AB 2-0 CT1 TAPERPNT 27 (SUTURE) ×2 IMPLANT
SUT VLOC 180 0 24IN GS25 (SUTURE) ×3 IMPLANT
SYR 20CC LL (SYRINGE) ×3 IMPLANT
SYR 50ML LL SCALE MARK (SYRINGE) ×3 IMPLANT
TOWEL OR 17X26 10 PK STRL BLUE (TOWEL DISPOSABLE) ×3 IMPLANT
TRAY FOLEY CATH 14FRSI W/METER (CATHETERS) ×3 IMPLANT

## 2014-06-26 NOTE — Progress Notes (Signed)
Utilization review completed.  

## 2014-06-26 NOTE — Anesthesia Procedure Notes (Signed)
Spinal  Patient location during procedure: OR Start time: 06/26/2014 1:37 PM End time: 06/26/2014 1:40 PM Staffing CRNA/Resident: Anne Fu Performed by: resident/CRNA  Preanesthetic Checklist Completed: patient identified, site marked, surgical consent, pre-op evaluation, timeout performed, IV checked, risks and benefits discussed and monitors and equipment checked Spinal Block Patient position: sitting Prep: Betadine Patient monitoring: heart rate, continuous pulse ox and blood pressure Approach: right paramedian Location: L3-4 Injection technique: single-shot Needle Needle type: Spinocan  Needle gauge: 22 G Needle length: 9 cm Assessment Sensory level: T6 Additional Notes Expiration date of kit checked and confirmed. Patient tolerated procedure well, without complications. X 1 attempt with noted clear CSF return easy aspiration and administration.  Noted loss of motor and sensory on exam. Patient tolerated procedure well.

## 2014-06-26 NOTE — Transfer of Care (Signed)
Immediate Anesthesia Transfer of Care Note  Patient: Hannah Downs  Procedure(s) Performed: Procedure(s) (LRB): RIGHT TOTAL HIP ARTHROPLASTY ANTERIOR APPROACH WITH ACETABULAR AUTOGRAFT (Right)  Patient Location: PACU  Anesthesia Type: Spinal  Level of Consciousness: sedated, patient cooperative and responds to stimulation  Airway & Oxygen Therapy: Patient Spontanous Breathing and Patient connected to face mask oxgen  Post-op Assessment: Report given to PACU RN and Post -op Vital signs reviewed and stable  Post vital signs: Reviewed and stable T-11 ;evel on exam denied pain on assessment. Released stable V/S report given.   Complications: No apparent anesthesia complications 2

## 2014-06-26 NOTE — Op Note (Signed)
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   PROCEDURE: Right total hip arthroplasty, anterior approach with acetabular autograft.   SURGEON: Hannah GrossFrank Tagen Milby, MD   ASSISTANT: Hannah Peacerew Perkins, PA-C  ANESTHESIA:  Spinal  ESTIMATED BLOOD LOSS:-400 ml    DRAINS: Hemovac x1.   COMPLICATIONS: None   CONDITION: PACU - hemodynamically stable.   BRIEF CLINICAL NOTE: Hannah Downs is a 68 y.o. female who has advanced end-  stage arthritis of her Right  hip with progressively worsening pain and  dysfunction.The patient has failed nonoperative management and presents for  total hip arthroplasty.   PROCEDURE IN DETAIL: After successful administration of spinal  anesthetic, the traction boots for the [facility] bed were placed on both  feet and the [facility], boots placed into the leg  holders. The Right hip was then isolated from the perineum with plastic  drapes and prepped and draped in the usual sterile fashion. ASIS and  greater trochanter were marked and a oblique incision was made, starting  at about 1 cm lateral and 2 cm distal to the ASIS and coursing towards  the anterior cortex of the femur. The skin was cut with a 10 blade  through subcutaneous tissue to the level of the fascia overlying the  tensor fascia lata muscle. The fascia was then incised in line with the  incision at the junction of the anterior third and posterior 2/3rd. The  muscle was teased off the fascia and then the interval between the TFL  and the rectus was developed. The Hohmann retractor was then placed at  the top of the femoral neck over the capsule. The vessels overlying the  capsule were cauterized and the fat on top of the capsule was removed.  A Hohmann retractor was then placed anterior underneath the rectus  femoris to give  exposure to the entire anterior capsule. A T-shaped  capsulotomy was performed. The edges were tagged and the femoral head  was identified.       Osteophytes are removed off the superior acetabulum.  The femoral neck was then cut in situ with an oscillating saw. Traction  was then applied to the left lower extremity utilizing the [facility]  traction. The femoral head was then removed. Retractors were placed  around the acetabulum and then circumferential removal of the labrum was  performed. Osteophytes were also removed. Reaming starts at 45 mm to  medialize and  Increased in 2 mm increments to 55 mm. We reamed in  approximately 40 degrees of abduction, 20 degrees anteversion. I then used cancellous allograft from her femoral head to graft the protrusio defect A 56 mm  pinnacle acetabular shell was then impacted in anatomic position under  fluoroscopic guidance with excellent purchase. I placed 2  additional dome screws. A 36 mm neutral + 4 marathon liner was then  placed into the acetabular shell.       The femoral lift was then placed along the lateral aspect of the femur  just distal to the vastus ridge. The leg was  externally rotated and capsule  was stripped off the inferior aspect of the femoral neck down to the  level of the lesser trochanter, this was done with electrocautery. The femur was lifted after this was performed. The  leg was then placed and extended in  adducted position to essentially delivering the femur. We also removed the capsule superiorly and the  piriformis from the piriformis fossa to gain excellent exposure of the  proximal femur. Rongeur was used to remove some cancellous bone to get  into the lateral portion of the proximal femur for placement of the  initial starter reamer. The starter broaches was placed  the starter broach  and was shown to go down the center of the canal. Broaching  with the  Corail system was then performed starting at size 8, coursing  Up to  size 10. A size 10 had excellent torsional and rotational  and axial stability. The trial high offset neck was then placed  with a 36 + 1.5 trial head. The hip was then reduced. We confirmed that  the stem was in the canal both on AP and lateral x-rays. It also has excellent sizing. The hip was reduced with outstanding stability through full extension, full external rotation,  and then flexion in adduction internal rotation. AP pelvis was taken  and the leg lengths were measured and found to be exactly equal. Hip  was then dislocated again and the femoral head and neck removed. The  femoral broach was removed. Size 10 Corail stem with a high offset  neck was then impacted into the femur following native anteversion. Has  excellent purchase in the canal. Excellent torsional and rotational and  axial stability. It is confirmed to be in the canal on AP and lateral  fluoroscopic views. The 36 + 1.5 ceramic head was placed and the hip  reduced with outstanding stability. Again AP pelvis was taken and it  confirmed that the leg lengths were equal. The wound was then copiously  irrigated with saline solution and the capsule reattached and repaired  with Ethibond suture.  20 mL of Exparel mixed with 50 mL of saline then additional 20 ml of .25% Bupivicaine injected into the capsule and into the edge of the tensor fascia lata as well as subcutaneous tissue. The fascia overlying the tensor fascia lata was  then closed with a running #1 V-Loc. Subcu was closed with interrupted  2-0 Vicryl and subcuticular running 4-0 Monocryl. Incision was cleaned  and dried. Steri-Strips and a bulky sterile dressing applied. Hemovac  drain was hooked to suction and then he was awakened and transported to  recovery in stable condition.        Please note that a surgical assistant was a medical necessity for this procedure to perform it in a safe and expeditious manner. Assistant was necessary to provide appropriate  retraction of vital neurovascular structures and to prevent femoral fracture and allow for anatomic placement of the prosthesis.  Hannah Downs, M.D.

## 2014-06-26 NOTE — Anesthesia Preprocedure Evaluation (Signed)
Anesthesia Evaluation  Patient identified by MRN, date of birth, ID band Patient awake    Reviewed: Allergy & Precautions, H&P , NPO status , Patient's Chart, lab work & pertinent test results, reviewed documented beta blocker date and time   Airway Mallampati: II TM Distance: >3 FB Neck ROM: Full    Dental no notable dental hx.    Pulmonary neg pulmonary ROS,  breath sounds clear to auscultation  Pulmonary exam normal       Cardiovascular hypertension, Pt. on medications and Pt. on home beta blockers Rhythm:Regular Rate:Normal     Neuro/Psych negative neurological ROS  negative psych ROS   GI/Hepatic Neg liver ROS, GERD-  Medicated,  Endo/Other  negative endocrine ROS  Renal/GU negative Renal ROS     Musculoskeletal  (+) Arthritis -,   Abdominal   Peds  Hematology negative hematology ROS (+)   Anesthesia Other Findings   Reproductive/Obstetrics negative OB ROS                           Anesthesia Physical Anesthesia Plan  ASA: II  Anesthesia Plan:    Post-op Pain Management:    Induction:   Airway Management Planned:   Additional Equipment:   Intra-op Plan:   Post-operative Plan:   Informed Consent: I have reviewed the patients History and Physical, chart, labs and discussed the procedure including the risks, benefits and alternatives for the proposed anesthesia with the patient or authorized representative who has indicated his/her understanding and acceptance.   Dental advisory given  Plan Discussed with: CRNA  Anesthesia Plan Comments:         Anesthesia Quick Evaluation

## 2014-06-26 NOTE — Anesthesia Postprocedure Evaluation (Signed)
Anesthesia Post Note  Patient: Hannah Downs  Procedure(s) Performed: Procedure(s) (LRB): RIGHT TOTAL HIP ARTHROPLASTY ANTERIOR APPROACH WITH ACETABULAR AUTOGRAFT (Right)  Anesthesia type: Spinal  Patient location: PACU  Post pain: Pain level controlled  Post assessment: Post-op Vital signs reviewed  Last Vitals: BP 133/69  Pulse 75  Temp(Src) 36.4 C (Oral)  Resp 15  Ht 5\' 1"  (1.549 m)  Wt 124 lb 8 oz (56.473 kg)  BMI 23.54 kg/m2  SpO2 99%  Post vital signs: Reviewed  Level of consciousness: sedated  Complications: No apparent anesthesia complications \

## 2014-06-26 NOTE — Interval H&P Note (Signed)
History and Physical Interval Note:  06/26/2014 1:27 PM  Hannah Downs  has presented today for surgery, with the diagnosis of OA RIGHT HIP WITH PROTRUSION DEFORMITY   The various methods of treatment have been discussed with the patient and family. After consideration of risks, benefits and other options for treatment, the patient has consented to  Procedure(s): RIGHT TOTAL HIP ARTHROPLASTY ANTERIOR APPROACH WITH ACETABULAR GRAFTING  (Right) as a surgical intervention .  The patient's history has been reviewed, patient examined, no change in status, stable for surgery.  I have reviewed the patient's chart and labs.  Questions were answered to the patient's satisfaction.     Loanne DrillingALUISIO,Lemma Tetro V

## 2014-06-26 NOTE — Plan of Care (Signed)
Problem: Consults Goal: Diagnosis- Total Joint Replacement Primary Total Hip     

## 2014-06-27 ENCOUNTER — Encounter (HOSPITAL_COMMUNITY): Payer: Self-pay | Admitting: Orthopedic Surgery

## 2014-06-27 LAB — BASIC METABOLIC PANEL
ANION GAP: 10 (ref 5–15)
BUN: 12 mg/dL (ref 6–23)
CALCIUM: 8.8 mg/dL (ref 8.4–10.5)
CO2: 27 meq/L (ref 19–32)
Chloride: 96 mEq/L (ref 96–112)
Creatinine, Ser: 0.48 mg/dL — ABNORMAL LOW (ref 0.50–1.10)
GFR calc Af Amer: >90 mL/min (ref >90)
Glucose, Bld: 138 mg/dL — ABNORMAL HIGH (ref 70–99)
Potassium: 4.1 mEq/L (ref 3.7–5.3)
SODIUM: 133 meq/L — AB (ref 137–147)

## 2014-06-27 LAB — CBC
HCT: 31.4 % — ABNORMAL LOW (ref 36.0–46.0)
Hemoglobin: 10.8 g/dL — ABNORMAL LOW (ref 12.0–15.0)
MCH: 30.3 pg (ref 26.0–34.0)
MCHC: 34.4 g/dL (ref 30.0–36.0)
MCV: 88 fL (ref 78.0–100.0)
Platelets: 248 10*3/uL (ref 150–400)
RBC: 3.57 MIL/uL — ABNORMAL LOW (ref 3.87–5.11)
RDW: 12.1 % (ref 11.5–15.5)
WBC: 9.7 10*3/uL (ref 4.0–10.5)

## 2014-06-27 MED ORDER — TRAMADOL HCL 50 MG PO TABS: 50.0000 mg | ORAL_TABLET | Freq: Four times a day (QID) | ORAL | Status: DC | PRN

## 2014-06-27 MED ORDER — OXYCODONE HCL 5 MG PO TABS: 5.0000 mg | ORAL_TABLET | ORAL | Status: DC | PRN

## 2014-06-27 MED ORDER — RIVAROXABAN 10 MG PO TABS: 10.0000 mg | ORAL_TABLET | Freq: Every day | ORAL | Status: DC

## 2014-06-27 MED ORDER — METHOCARBAMOL 500 MG PO TABS: 500.0000 mg | ORAL_TABLET | Freq: Four times a day (QID) | ORAL | Status: DC | PRN

## 2014-06-27 NOTE — Progress Notes (Signed)
   Subjective: 1 Day Post-Op Procedure(s) (LRB): RIGHT TOTAL HIP ARTHROPLASTY ANTERIOR APPROACH WITH ACETABULAR AUTOGRAFT (Right) Patient reports pain as mild.   Patient seen in rounds with Dr. Lequita HaltAluisio.  Sitting up in bed and doing well this morning. Patient is well, and has had no acute complaints or problems Patient is ready to go home today after therapy if meets all goals.  Objective: Vital signs in last 24 hours: Temp:  [97.6 F (36.4 C)-98.3 F (36.8 C)] 97.9 F (36.6 C) (10/15 0542) Pulse Rate:  [55-122] 78 (10/15 0542) Resp:  [12-16] 16 (10/15 0542) BP: (107-149)/(53-92) 114/65 mmHg (10/15 0542) SpO2:  [99 %-100 %] 100 % (10/15 0542) Weight:  [56.473 kg (124 lb 8 oz)] 56.473 kg (124 lb 8 oz) (10/14 1108)  Intake/Output from previous day:  Intake/Output Summary (Last 24 hours) at 06/27/14 1022 Last data filed at 06/27/14 0936  Gross per 24 hour  Intake 4672.5 ml  Output   2840 ml  Net 1832.5 ml    Intake/Output this shift: Total I/O In: 240 [P.O.:240] Out: 200 [Urine:200]  Labs:  Recent Labs  06/27/14 0425  HGB 10.8*    Recent Labs  06/27/14 0425  WBC 9.7  RBC 3.57*  HCT 31.4*  PLT 248    Recent Labs  06/27/14 0425  NA 133*  K 4.1  CL 96  CO2 27  BUN 12  CREATININE 0.48*  GLUCOSE 138*  CALCIUM 8.8   No results found for this basename: LABPT, INR,  in the last 72 hours  EXAM: General - Patient is Alert, Appropriate and Oriented Extremity - Neurovascular intact Sensation intact distally Dorsiflexion/Plantar flexion intact Dressing - clean, dry Motor Function - intact, moving foot and toes well on exam.   Assessment/Plan: 1 Day Post-Op Procedure(s) (LRB): RIGHT TOTAL HIP ARTHROPLASTY ANTERIOR APPROACH WITH ACETABULAR AUTOGRAFT (Right) Procedure(s) (LRB): RIGHT TOTAL HIP ARTHROPLASTY ANTERIOR APPROACH WITH ACETABULAR AUTOGRAFT (Right) Past Medical History  Diagnosis Date  . Hypertension   . GERD (gastroesophageal reflux disease)     . Arthritis   . Varicose vein     right leg  . Miscarriage     times 3  . Osteopenia   . Vitamin D deficiency   . Lumbar degenerative disc disease   . Hypercholesterolemia   . Seasonal allergies    Principal Problem:   OA (osteoarthritis) of hip  Estimated body mass index is 23.54 kg/(m^2) as calculated from the following:   Height as of this encounter: 5\' 1"  (1.549 m).   Weight as of this encounter: 56.473 kg (124 lb 8 oz). Up with therapy Discharge home with home health if meets all goals Diet - Cardiac diet Follow up - in 2 weeks Activity - WBAT Disposition - Home Condition Upon Discharge - Good D/C Meds - See DC Summary DVT Prophylaxis - Xarelto  Avel Peacerew Perkins, PA-C Orthopaedic Surgery 06/27/2014, 10:22 AM

## 2014-06-27 NOTE — Discharge Summary (Signed)
Physician Discharge Summary   Patient ID: Hannah Downs MRN: [id number] DOB/AGE: 1946/01/25 68 y.o.  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    Admission Diagnoses:  Past Medical History  Diagnosis Date  . Hypertension   . GERD (gastroesophageal reflux disease)   . Arthritis   . Varicose vein     right leg  . Miscarriage     times 3  . Osteopenia   . Vitamin D deficiency   . Lumbar degenerative disc disease   . Hypercholesterolemia   . Seasonal allergies    Discharge Diagnoses:   Principal Problem:   OA (osteoarthritis) of hip  Estimated body mass index is 23.54 kg/(m^2) as calculated from the following:   Height as of this encounter: 5\' 1"  (1.549 m).   Weight as of this encounter: 56.473 kg (124 lb 8 oz).  Procedure(s) (LRB): RIGHT TOTAL HIP ARTHROPLASTY ANTERIOR APPROACH WITH ACETABULAR AUTOGRAFT (Right)   Consults: None  HPI: Hannah Downs is a 68 y.o. female who has advanced end-  stage arthritis of her Right hip with progressively worsening pain and  dysfunction.The patient has failed nonoperative management and presents for  total hip arthroplasty.   Laboratory Data: Admission on 06/26/2014  Component Date Value Ref Range Status  . WBC 06/27/2014 9.7  4.0 - 10.5 K/uL Final  . RBC 06/27/2014 3.57* 3.87 - 5.11 MIL/uL Final  . Hemoglobin 06/27/2014 10.8* 12.0 - 15.0 g/dL Final  . HCT 40/98/1191 31.4* 36.0 - 46.0 % Final  . MCV 06/27/2014 88.0  78.0 - 100.0 fL Final  . MCH 06/27/2014 30.3  26.0 - 34.0 pg Final  . MCHC 06/27/2014 34.4  30.0 - 36.0 g/dL Final  . RDW 47/82/9562 12.1  11.5 - 15.5 % Final  . Platelets 06/27/2014 248  150 - 400 K/uL Final  . Sodium 06/27/2014 133* 137 - 147 mEq/L Final  . Potassium 06/27/2014 4.1  3.7 - 5.3 mEq/L Final  . Chloride 06/27/2014 96  96 - 112 mEq/L Final  . CO2 06/27/2014 27  19 - 32 mEq/L Final  . Glucose, Bld  06/27/2014 138* 70 - 99 mg/dL Final  . BUN 13/04/6577 12  6 - 23 mg/dL Final  . Creatinine, Ser 06/27/2014 0.48* 0.50 - 1.10 mg/dL Final  . Calcium 46/96/2952 8.8  8.4 - 10.5 mg/dL Final  . GFR calc non Af Amer 06/27/2014 >90  >90 mL/min Final  . GFR calc Af Amer 06/27/2014 >90  >90 mL/min Final   Comment: (NOTE)                          The eGFR has been calculated using the CKD EPI equation.                          This calculation has not been validated in all clinical situations.                          eGFR's persistently <90 mL/min signify possible Chronic Kidney                          Disease.  Eustaquio Boyden gap 06/27/2014 10  5 - 15 Final  Hospital Outpatient Visit on 06/18/2014  Component Date Value Ref Range Status  . ABO/RH(D) 06/18/2014 O POS  Final  Hospital Outpatient Visit on 06/18/2014  Component Date Value Ref Range Status  . aPTT 06/18/2014 32  24 - 37 seconds Final  . WBC 06/18/2014 7.5  4.0 - 10.5 K/uL Final  . RBC 06/18/2014 4.46  3.87 - 5.11 MIL/uL Final  . Hemoglobin 06/18/2014 13.5  12.0 - 15.0 g/dL Final  . HCT 62/83/1517 39.0  36.0 - 46.0 % Final  . MCV 06/18/2014 87.4  78.0 - 100.0 fL Final  . MCH 06/18/2014 30.3  26.0 - 34.0 pg Final  . MCHC 06/18/2014 34.6  30.0 - 36.0 g/dL Final  . RDW 61/60/7371 12.1  11.5 - 15.5 % Final  . Platelets 06/18/2014 303  150 - 400 K/uL Final  . Sodium 06/18/2014 133* 137 - 147 mEq/L Final  . Potassium 06/18/2014 3.8  3.7 - 5.3 mEq/L Final  . Chloride 06/18/2014 91* 96 - 112 mEq/L Final  . CO2 06/18/2014 28  19 - 32 mEq/L Final  . Glucose, Bld 06/18/2014 101* 70 - 99 mg/dL Final  . BUN 03/08/9484 16  6 - 23 mg/dL Final  . Creatinine, Ser 06/18/2014 0.50  0.50 - 1.10 mg/dL Final  . Calcium 46/27/0350 10.2  8.4 - 10.5 mg/dL Final  . Total Protein 06/18/2014 7.8  6.0 - 8.3 g/dL Final  . Albumin 09/38/1829 4.6  3.5 - 5.2 g/dL Final  . AST 93/71/6967 20  0 - 37 U/L Final  . ALT 06/18/2014 12  0 - 35 U/L Final  . Alkaline  Phosphatase 06/18/2014 59  39 - 117 U/L Final  . Total Bilirubin 06/18/2014 1.5* 0.3 - 1.2 mg/dL Final  . GFR calc non Af Amer 06/18/2014 >90  >90 mL/min Final  . GFR calc Af Amer 06/18/2014 >90  >90 mL/min Final   Comment: (NOTE)                          The eGFR has been calculated using the CKD EPI equation.                          This calculation has not been validated in all clinical situations.                          eGFR's persistently <90 mL/min signify possible Chronic Kidney                          Disease.  . Anion gap 06/18/2014 14  5 - 15 Final  . Prothrombin Time 06/18/2014 14.0  11.6 - 15.2 seconds Final  . INR 06/18/2014 1.07  0.00 - 1.49 Final  . ABO/RH(D) 06/18/2014 O POS   Final  . Antibody Screen 06/18/2014 NEG   Final  . Sample Expiration 06/18/2014 06/29/2014   Final  . Color, Urine 06/18/2014 YELLOW  YELLOW Final  . APPearance 06/18/2014 CLEAR  CLEAR Final  . Specific Gravity, Urine 06/18/2014 1.021  1.005 - 1.030 Final  . pH 06/18/2014 6.0  5.0 - 8.0 Final  . Glucose, UA 06/18/2014 NEGATIVE  NEGATIVE mg/dL Final  . Hgb urine dipstick 06/18/2014 NEGATIVE  NEGATIVE Final  . Bilirubin Urine 06/18/2014 NEGATIVE  NEGATIVE Final  . Ketones, ur 06/18/2014 15* NEGATIVE mg/dL Final  . Protein, ur 89/38/1017 NEGATIVE  NEGATIVE mg/dL Final  . Urobilinogen, UA 06/18/2014 1.0  0.0 - 1.0 mg/dL Final  . Nitrite  06/18/2014 POSITIVE* NEGATIVE Final  . Leukocytes, UA 06/18/2014 TRACE* NEGATIVE Final  . MRSA, PCR 06/18/2014 NEGATIVE  NEGATIVE Final  . Staphylococcus aureus 06/18/2014 POSITIVE* NEGATIVE Final   Comment:                                 The Xpert SA Assay (FDA                          approved for NASAL specimens                          in patients over 51 years of age),                          is one component of                          a comprehensive surveillance                          program.  Test performance has                          been  validated by [employer] for patients greater                          than or equal to 103 year old.                          It is not intended                          to diagnose infection nor to                          guide or monitor treatment.  . Squamous Epithelial / LPF 06/18/2014 RARE  RARE Final  . WBC, UA 06/18/2014 0-2  <3 WBC/hpf Final  . Bacteria, UA 06/18/2014 MANY* RARE Final     X-Rays:Dg Chest 2 View  06/18/2014   CLINICAL DATA:  Hypertension  EXAM: CHEST  2 VIEW  COMPARISON:  None.  FINDINGS: No active infiltrate or effusion is seen. The lungs are hyperaerated which may indicate emphysema with somewhat flattened hemidiaphragms. Mediastinal and hilar contours are unremarkable. The heart is within upper limits of normal. There is mild thoracolumbar scoliosis present.  IMPRESSION: No active lung disease. Slight hyper aeration. Thoracolumbar scoliosis.   Electronically Signed   By: Dwyane Dee M.D.   On: 06/18/2014 16:58   Dg Hip Complete Right  06/18/2014   CLINICAL DATA:  Osteoarthritis of the right hip.  EXAM: RIGHT HIP - COMPLETE 2+ VIEW  COMPARISON:  November 28, 2013.  FINDINGS: Severe degenerative joint disease of the right hip joint is again noted, with associated sclerosis and joint space narrowing. Mild acetabuli protrusio is noted. No fracture or dislocation is noted. Moderate to severe degenerative joint disease of the left hip is noted as well.  IMPRESSION: Bilateral  degenerative joint disease of the hips is noted as described above, worse on the right.   Electronically Signed   By: Roque Lias M.D.   On: 06/18/2014 16:58   Dg Pelvis Portable  06/26/2014   CLINICAL DATA:  Postoperative radiographs after RIGHT hip arthroplasty.  EXAM: DG C-ARM 1-60 MIN - NRPT [facility]; PORTABLE PELVIS 1-2 VIEWS  COMPARISON:  11/28/2013.  FINDINGS: New uncomplicated RIGHT total hip arthroplasty. Surgical drain is present in the soft tissues with associated and  expected postsurgical gas in the soft tissues. No complicating features. Severe LEFT hip osteoarthritis.  IMPRESSION: Uncomplicated new RIGHT total hip arthroplasty.   Electronically Signed   By: Andreas Newport M.D.   On: 06/26/2014 16:26   Dg C-arm 1-60 Min-no Report  06/26/2014   CLINICAL DATA:  Postoperative radiographs after RIGHT hip arthroplasty.  EXAM: DG C-ARM 1-60 MIN - [facility]PT [facility]; PORTABLE PELVIS 1-2 VIEWS  COMPARISON:  11/28/2013.  FINDINGS: New uncomplicated RIGHT total hip arthroplasty. Surgical drain is present in the soft tissues with associated and expected postsurgical gas in the soft tissues. No complicating features. Severe LEFT hip osteoarthritis.  IMPRESSION: Uncomplicated new RIGHT total hip arthroplasty.   Electronically Signed   By: Andreas Newport M.D.   On: 06/26/2014 16:26    EKG:No orders found for this or any previous visit.   Hospital Course: Patient was admitted to [facility] and taken to the OR and underwent the above state procedure without complications.  Patient tolerated the procedure well and was later transferred to the recovery room and then to the orthopaedic floor for postoperative care.  They were given PO and IV analgesics for pain control following their surgery.  They were given 24 hours of postoperative antibiotics of  Anti-infectives   Start     Dose/Rate Route Frequency Ordered Stop   06/26/14 2000  ceFAZolin (ANCEF) IVPB 2 g/50 mL premix     2 g 100 mL/hr over 30 Minutes Intravenous Every 6 hours 06/26/14 1654 06/27/14 0226   06/26/14 1102  ceFAZolin (ANCEF) IVPB 2 g/50 mL premix     2 g 100 mL/hr over 30 Minutes Intravenous On call to O.R. 06/26/14 1102 06/26/14 1345     and started on DVT prophylaxis in the form of Xarelto.   PT and OT were ordered for total hip protocol.  The patient was allowed to be WBAT with therapy. Discharge planning was consulted to help with postop disposition and equipment needs.  Patient had a good night on the  evening of surgery.  They started to get up OOB with therapy on day one.  Hemovac drain was pulled without difficulty.   Patient was seen in rounds on day one and felt was ready to go home as long as she did well with therapy that afternoon. Set up for discharge.   Discharge home with home health if meets all goals Diet - Cardiac diet Follow up - in 2 weeks Activity - WBAT Disposition - Home Condition Upon Discharge - Good D/C Meds - See DC Summary DVT Prophylaxis - Xarelto  Discharge Instructions   Call MD / Call 911    Complete by:  As directed   If you experience chest pain or shortness of breath, CALL 911 and be transported to the hospital emergency room.  If you develope a fever above 101 F, pus (white drainage) or increased drainage or redness at the wound, or calf pain, call your surgeon's office.     Change  dressing    Complete by:  As directed   You may change your dressing dressing daily with sterile 4 x 4 inch gauze dressing and paper tape.  Do not submerge the incision under water.     Constipation Prevention    Complete by:  As directed   Drink plenty of fluids.  Prune juice may be helpful.  You may use a stool softener, such as Colace (over the counter) 100 mg twice a day.  Use MiraLax (over the counter) for constipation as needed.     Diet - low sodium heart healthy    Complete by:  As directed      Discharge instructions    Complete by:  As directed   Pick up stool softner and laxative for home. Do not submerge incision under water. May shower. Continue to use ice for pain and swelling from surgery.  Total Hip Protocol.  Take Xarelto for two and a half more weeks, then discontinue Xarelto. Once the patient has completed the blood thinner regimen, then take a Baby 81 mg Aspirin daily for three more weeks.     Do not sit on low chairs, stoools or toilet seats, as it may be difficult to get up from low surfaces    Complete by:  As directed      Driving restrictions     Complete by:  As directed   No driving until released by the physician.     Increase activity slowly as tolerated    Complete by:  As directed      Lifting restrictions    Complete by:  As directed   No lifting until released by the physician.     Patient may shower    Complete by:  As directed   You may shower without a dressing once there is no drainage.  Do not wash over the wound.  If drainage remains, do not shower until drainage stops.     TED hose    Complete by:  As directed   Use stockings (TED hose) for 3 weeks on both leg(s).  You may remove them at night for sleeping.     Weight bearing as tolerated    Complete by:  As directed   Laterality:  right  Extremity:  Lower            Medication List    STOP taking these medications       cholecalciferol 1000 UNITS tablet  Commonly known as:  VITAMIN D     ibuprofen 200 MG tablet  Commonly known as:  ADVIL,MOTRIN      TAKE these medications       atenolol-chlorthalidone 50-25 MG per tablet  Commonly known as:  TENORETIC  Take 1 tablet by mouth every morning.     cetirizine 10 MG tablet  Commonly known as:  ZYRTEC  Take 10 mg by mouth at bedtime.     methocarbamol 500 MG tablet  Commonly known as:  ROBAXIN  Take 1 tablet (500 mg total) by mouth every 6 (six) hours as needed for muscle spasms.     montelukast 10 MG tablet  Commonly known as:  SINGULAIR  Take 10 mg by mouth at bedtime.     oxyCODONE 5 MG immediate release tablet  Commonly known as:  Oxy IR/ROXICODONE  Take 1-2 tablets (5-10 mg total) by mouth every 3 (three) hours as needed for moderate pain, severe pain or breakthrough pain.     pantoprazole 40  MG tablet  Commonly known as:  PROTONIX  Take 40 mg by mouth 2 (two) times daily.     rivaroxaban 10 MG Tabs tablet  Commonly known as:  XARELTO  - Take 1 tablet (10 mg total) by mouth daily with breakfast. Take Xarelto for two and a half more weeks, then discontinue Xarelto.  - Once the  patient has completed the blood thinner regimen, then take a Baby 81 mg Aspirin daily for three more weeks.     traMADol 50 MG tablet  Commonly known as:  ULTRAM  Take 1-2 tablets (50-100 mg total) by mouth every 6 (six) hours as needed (mild pain).           Follow-up Information   Follow up with [facility] (home health physical therapy)    Contact information:   [address] [place] [address] [phone]       Follow up with Loanne Drilling, MD. Schedule an appointment as soon as possible for a visit on 07/09/2014. (Call office at [phone] to set up appointment of Tuesday October 27th.)    Specialty:  Orthopedic Surgery   Contact information:   [address] [place] [address] [phone]       Signed: Avel Peace, PA-C Orthopaedic Surgery 06/27/2014, 10:27 AM

## 2014-06-27 NOTE — Evaluation (Signed)
Occupational Therapy Evaluation Patient Details Name: Hannah Downs MRN: [id number] DOB: [birth year] Today's Date: 06/27/2014    History of Present Illness R THR, DA with acetabulum autograft   Clinical Impression   This 68 year old female was admitted for the above surgery.  All education was completed and pt will have 24/7 help as needed at home.  Pt plans to get a shower chair for increased safety.  No further OT needs at this time    Follow Up Recommendations  No OT follow up    Equipment Recommendations   (family plans to get shower chair)    Recommendations for Other Services       Precautions / Restrictions Precautions Precautions: Fall Restrictions Weight Bearing Restrictions: No Other Position/Activity Restrictions: WBAT      Mobility Bed Mobility Overal bed mobility: Needs Assistance Bed Mobility: Supine to Sit          General bed mobility comments: not tested in OT  Transfers Overall transfer level: Needs assistance Equipment used: Rolling walker (2 wheeled) Transfers: Sit to/from Stand Sit to Stand: Min guard         General transfer comment: for safety:  cues for UE placement    Balance                                            ADL Overall ADL's : Needs assistance/impaired             Lower Body Bathing: Minimal assistance;Sit to/from stand       Lower Body Dressing: Minimal assistance;Sit to/from stand           Tub/ Shower Transfer: Min guard;Walk-in shower (simulated 2 inch ledge)     General ADL Comments: Pt is able to complete UB adls with set up.  She does not have reacher, but husband will assist her.  Pt also does not have a shower seat in her bathroom--recommend this, 3:1 if she can borrow or even resin chair. Pt usually wears flip flops--recommended closed back shoe.  She has crocs which she can use without socks.  Pt is anxious to get back to being active and independent.  reinforced need to  balance activity and especially not push through pain  Pt has questions about chairs to sit in--recommended she have HHPT look at them with her when they arrive (i.e. Computer chair is on wheels on carpet)     Vision                     Perception     Praxis      Pertinent Vitals/Pain Pain Assessment: 0-10 Pain Score: 2  Pain Location: R thigh Pain Descriptors / Indicators: Sore Pain Intervention(s): Limited activity within patient's tolerance;Monitored during session;Repositioned     Hand Dominance Right   Extremity/Trunk Assessment Upper Extremity Assessment Upper Extremity Assessment: Overall WFL for tasks assessed      Cervical / Trunk Assessment Cervical / Trunk Assessment: Normal   Communication Communication Communication: No difficulties   Cognition Arousal/Alertness: Awake/alert Behavior During Therapy: WFL for tasks assessed/performed Overall Cognitive Status: Within Functional Limits for tasks assessed                     General Comments       Exercises      Shoulder Instructions  Home Living Family/patient expects to be discharged to:: Private residence Living Arrangements: Spouse/significant other;Children (has 12 children) Available Help at Discharge: Family Type of Home: House Home Access: Stairs to enter Secretary/administrator of Steps: 4 Entrance Stairs-Rails: Right Home Layout: One level     Bathroom Shower/Tub: Producer, television/film/video: Handicapped height     Home Equipment: Environmental consultant - 2 wheels;Cane - quad   Additional Comments: cabinet next to high commode      Prior Functioning/Environment Level of Independence: Independent with assistive device(s)             OT Diagnosis: Generalized weakness   OT Problem List:     OT Treatment/Interventions:      OT Goals(Current goals can be found in the care plan section) Acute Rehab OT Goals Patient Stated Goal: Resume previous lifestyle with  decreased pain  OT Frequency:     Barriers to D/C:            Co-evaluation              End of Session    Activity Tolerance: Patient tolerated treatment well Patient left: in chair;with call bell/phone within reach;with family/visitor present   Time: 1208-1228 OT Time Calculation (min): 20 min Charges:  OT General Charges $OT Visit: 1 Procedure OT Evaluation $Initial OT Evaluation Tier I: 1 Procedure OT Treatments $Self Care/Home Management : 8-22 mins G-Codes:    Tehilla Coffel 03-Jul-2014, 12:45 PM Marica Otter, OTR/L [phone] 07/03/2014

## 2014-06-27 NOTE — Evaluation (Signed)
Physical Therapy Evaluation Patient Details Name: Hannah Downs MRN: [id number] DOB: 15-Oct-1945 Today's Date: 06/27/2014   History of Present Illness  R THR  Clinical Impression  Pt s/p R THR presents with decreased R LE strength/ROM and post op pain limiting functional mobility.  Pt should progress well to d/c home with family assist and HHPT follow up.    Follow Up Recommendations Home health PT    Equipment Recommendations  None recommended by PT    Recommendations for Other Services OT consult     Precautions / Restrictions Precautions Precautions: Fall Restrictions Weight Bearing Restrictions: No Other Position/Activity Restrictions: WBAT      Mobility  Bed Mobility Overal bed mobility: Needs Assistance Bed Mobility: Supine to Sit     Supine to sit: Min guard     General bed mobility comments: cues for sequence and use of L LE to self assist  Transfers Overall transfer level: Needs assistance Equipment used: Rolling walker (2 wheeled) Transfers: Sit to/from Stand Sit to Stand: Min assist         General transfer comment: cues for LE management and use of UEs to self assist  Ambulation/Gait Ambulation/Gait assistance: Min assist Ambulation Distance (Feet): 200 Feet Assistive device: Rolling walker (2 wheeled) Gait Pattern/deviations: Step-to pattern;Step-through pattern;Decreased step length - right;Decreased step length - left;Shuffle;Antalgic     General Gait Details: cues for posture, position from RW, ER on R and initial sequence  Stairs            Wheelchair Mobility    Modified Rankin (Stroke Patients Only)       Balance                                             Pertinent Vitals/Pain Pain Assessment: 0-10 Pain Score: 4  Pain Location:  R hip and buttocks Pain Descriptors / Indicators: Aching;Sore Pain Intervention(s): Limited activity within patient's tolerance;Monitored during session;Premedicated  before session;Ice applied    Home Living Family/patient expects to be discharged to:: Private residence Living Arrangements: Children Available Help at Discharge: Family Type of Home: House Home Access: Stairs to enter Entrance Stairs-Rails: Right Entrance Stairs-Number of Steps: 4 Home Layout: One level Home Equipment: Environmental consultant - 2 wheels;Cane - quad      Prior Function Level of Independence: Independent with assistive device(s)               Hand Dominance   Dominant Hand: Right    Extremity/Trunk Assessment   Upper Extremity Assessment: Overall WFL for tasks assessed           Lower Extremity Assessment: RLE deficits/detail RLE Deficits / Details: 2+/5 hip strength with AAROM at hip to 95 flex and 20 abd    Cervical / Trunk Assessment: Normal  Communication   Communication: No difficulties  Cognition Arousal/Alertness: Awake/alert Behavior During Therapy: WFL for tasks assessed/performed Overall Cognitive Status: Within Functional Limits for tasks assessed                      General Comments      Exercises Total Joint Exercises Ankle Circles/Pumps: AROM;Both;15 reps;Supine Quad Sets: AROM;Both;10 reps;Supine Heel Slides: AAROM;20 reps;Supine;Right Hip ABduction/ADduction: AAROM;Right;15 reps;Supine      Assessment/Plan    PT Assessment Patient needs continued PT services  PT Diagnosis Difficulty walking   PT Problem List Decreased strength;Decreased range  of motion;Decreased activity tolerance;Decreased mobility;Decreased knowledge of use of DME;Pain  PT Treatment Interventions DME instruction;Gait training;Stair training;Functional mobility training;Therapeutic activities;Therapeutic exercise;Patient/family education   PT Goals (Current goals can be found in the Care Plan section) Acute Rehab PT Goals Patient Stated Goal: Resume previous lifestyle with decreased pain PT Goal Formulation: With patient Potential to Achieve Goals:  Good    Frequency 7X/week   Barriers to discharge        Co-evaluation               End of Session Equipment Utilized During Treatment: Gait belt Activity Tolerance: Patient tolerated treatment well Patient left: in chair;with call bell/phone within reach;with family/visitor present Nurse Communication: Mobility status         Time: 1020-1050 PT Time Calculation (min): 30 min   Charges:   PT Evaluation $Initial PT Evaluation Tier I: 1 Procedure PT Treatments $Gait Training: 8-22 mins $Therapeutic Exercise: 8-22 mins   PT G Codes:          [name] 06/27/2014, 11:33 AM

## 2014-06-27 NOTE — Discharge Instructions (Addendum)
°Dr. Frank Aluisio °Total Joint Specialist °[facility] °[address] °, [place] [address] °[phone] ° ° ° °ANTERIOR APPROACH TOTAL HIP REPLACEMENT POSTOPERATIVE DIRECTIONS ° ° °Hip Rehabilitation, Guidelines Following Surgery  °The results of a hip operation are greatly improved after range of motion and muscle strengthening exercises. Follow all safety measures which are given to protect your hip. If any of these exercises cause increased pain or swelling in your joint, decrease the amount until you are comfortable again. Then slowly increase the exercises. Call your caregiver if you have problems or questions.  °HOME CARE INSTRUCTIONS  °Most of the following instructions are designed to prevent the dislocation of your new hip.  °Remove items at home which could result in a fall. This includes throw rugs or furniture in walking pathways.  °Continue medications as instructed at time of discharge. °· You may have some home medications which will be placed on hold until you complete the course of blood thinner medication. °· You may start showering once you are discharged home but do not submerge the incision under water. Just pat the incision dry and apply a dry gauze dressing on daily. °Do not put on socks or shoes without following the instructions of your caregivers.  °Sit on high chairs which makes it easier to stand.  °Sit on chairs with arms. Use the chair arms to help push yourself up when arising.  °Keep your leg on the side of the operation out in front of you when standing up.  °Arrange for the use of a toilet seat elevator so you are not sitting low.   °· Walk with walker as instructed.  °You may resume a sexual relationship in one month or when given the OK by your caregiver.  °Use walker as long as suggested by your caregivers.  °You may put full weight on your legs and walk as much as is comfortable. °Avoid periods of inactivity such as sitting longer than an hour  when not asleep. This helps prevent blood clots.  °You may return to work once you are cleared by your surgeon.  °Do not drive a car for 6 weeks or until released by your surgeon.  °Do not drive while taking narcotics.  °Wear elastic stockings for three weeks following surgery during the day but you may remove then at night.  °Make sure you keep all of your appointments after your operation with all of your doctors and caregivers. You should call the office at the above phone number and make an appointment for approximately two weeks after the date of your surgery. °Change the dressing daily and reapply a dry dressing each time. °Please pick up a stool softener and laxative for home use as long as you are requiring pain medications. °· Continue to use ice on the hip for pain and swelling from surgery. You may notice swelling that will progress down to the foot and ankle.  This is normal after  surgery.  Elevate the leg when you are not up walking on it.   °It is important for you to complete the blood thinner medication as prescribed by your doctor. °· Continue to use the breathing machine which will help keep your temperature down.  It is common for your temperature to cycle up and down following surgery, especially at night when you are not up moving around and exerting yourself.  The breathing machine keeps your lungs expanded and your temperature down. ° °RANGE OF MOTION AND STRENGTHENING EXERCISES  °  These exercises are designed to help you keep full movement of your hip joint. Follow your caregiver's or physical therapist's instructions. Perform all exercises about fifteen times, three times per day or as directed. Exercise both hips, even if you have had only one joint replacement. These exercises can be done on a training (exercise) mat, on the floor, on a table or on a bed. Use whatever works the best and is most comfortable for you. Use music or television while you are exercising so that the exercises are  a pleasant break in your day. This will make your life better with the exercises acting as a break in routine you can look forward to.  °Lying on your back, slowly slide your foot toward your buttocks, raising your knee up off the floor. Then slowly slide your foot back down until your leg is straight again.  °Lying on your back spread your legs as far apart as you can without causing discomfort.  °Lying on your side, raise your upper leg and foot straight up from the floor as far as is comfortable. Slowly lower the leg and repeat.  °Lying on your back, tighten up the muscle in the front of your thigh (quadriceps muscles). You can do this by keeping your leg straight and trying to raise your heel off the floor. This helps strengthen the largest muscle supporting your knee.  °Lying on your back, tighten up the muscles of your buttocks both with the legs straight and with the knee bent at a comfortable angle while keeping your heel on the floor.  ° °SKILLED REHAB INSTRUCTIONS: °If the patient is transferred to a skilled rehab facility following release from the hospital, a list of the current medications will be sent to the facility for the patient to continue.  When discharged from the skilled rehab facility, please have the facility set up the patient's Home Health Physical Therapy prior to being released. Also, the skilled facility will be responsible for providing the patient with their medications at time of release from the facility to include their pain medication, the muscle relaxants, and their blood thinner medication. If the patient is still at the rehab facility at time of the two week follow up appointment, the skilled rehab facility will also need to assist the patient in arranging follow up appointment in our office and any transportation needs. ° °MAKE SURE YOU:  °Understand these instructions.  °Will watch your condition.  °Will get help right away if you are not doing well or get worse. ° °Pick up  stool softner and laxative for home. °Do not submerge incision under water. °May shower. °Continue to use ice for pain and swelling from surgery. °Total Hip Protocol. ° °Take Xarelto for two and a half more weeks, then discontinue Xarelto. °Once the patient has completed the blood thinner regimen, then take a Baby 81 mg Aspirin daily for three more weeks. °Information on my medicine - XARELTO® (Rivaroxaban) ° °This medication education was reviewed with me or my healthcare representative as part of my discharge preparation.  The pharmacist that spoke with me during my hospital stay was:  Sheena Donegan L, RPH ° °Why was Xarelto® prescribed for you? °Xarelto® was prescribed for you to reduce the risk of blood clots forming after orthopedic surgery. The medical term for these abnormal blood clots is venous thromboembolism (VTE). ° °What do you need to know about xarelto® ? °Take your Xarelto® ONCE DAILY at the same time every day. °You may take   it either with or without food. ° °If you have difficulty swallowing the tablet whole, you may crush it and mix in applesauce just prior to taking your dose. ° °Take Xarelto® exactly as prescribed by your doctor and DO NOT stop taking Xarelto® without talking to the doctor who prescribed the medication.  Stopping without other VTE prevention medication to take the place of Xarelto® may increase your risk of developing a clot. ° °After discharge, you should have regular check-up appointments with your healthcare provider that is prescribing your Xarelto®.   ° °What do you do if you miss a dose? °If you miss a dose, take it as soon as you remember on the same day then continue your regularly scheduled once daily regimen the next day. Do not take two doses of Xarelto® on the same day.  ° °Important Safety Information °A possible side effect of Xarelto® is bleeding. You should call your healthcare provider right away if you experience any of the following: °  Bleeding from an injury  or your nose that does not stop. °  Unusual colored urine (red or dark brown) or unusual colored stools (red or black). °  Unusual bruising for unknown reasons. °  A serious fall or if you hit your head (even if there is no bleeding). ° °Some medicines may interact with Xarelto® and might increase your risk of bleeding while on Xarelto®. To help avoid this, consult your healthcare provider or pharmacist prior to using any new prescription or non-prescription medications, including herbals, vitamins, non-steroidal anti-inflammatory drugs (NSAIDs) and supplements. ° °This website has more information on Xarelto®: www.xarelto.com. ° ° ° °

## 2014-06-27 NOTE — Care Management Note (Signed)
    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   (If response is "NO", the following Medicare IM given date fields will be blank) Date Medicare IM given:   Medicare IM given by:   Date Additional Medicare IM given:   Additional Medicare IM given by:    Discharge Disposition:  [place]  Per UR Regulation:    If discussed at Long Length of Stay Meetings, dates discussed:    Comments:  06/27/14 07:30 CM met with pt in room to confirm choice of home health agency.  Pt confirms her choice of  [facility] to render HHPT.  Address and contact information verified with pt.  No DMe is needed.  Referral called to Shaune [facility]ks.  No other CM needs were communicated.  Mariane Masters, BSN, CM [phone].

## 2014-06-27 NOTE — Progress Notes (Signed)
Physical Therapy Treatment Patient Details Name: Hannah Downs MRN: [id number] DOB: 1945/11/15 Today's Date: 06/27/2014    History of Present Illness R THR, DA with acetabulum autograft    PT Comments    Progressing well.  Reviewed stairs and car transfers with pt and family.  Follow Up Recommendations  Home health PT     Equipment Recommendations  None recommended by PT    Recommendations for Other Services OT consult     Precautions / Restrictions Precautions Precautions: Fall Restrictions Weight Bearing Restrictions: No Other Position/Activity Restrictions: WBAT    Mobility  Bed Mobility               General bed mobility comments: not tested in OT  Transfers Overall transfer level: Needs assistance Equipment used: Rolling walker (2 wheeled) Transfers: Sit to/from Stand Sit to Stand: Min guard         General transfer comment: for safety:  cues for UE placement  Ambulation/Gait Ambulation/Gait assistance: Min guard;Supervision Ambulation Distance (Feet): 200 Feet Assistive device: Rolling walker (2 wheeled) Gait Pattern/deviations: Step-to pattern;Step-through pattern;Decreased step length - right;Decreased step length - left;Shuffle;Antalgic     General Gait Details: cues for posture, position from RW, ER on R and initial sequence   Stairs Stairs: Yes Stairs assistance: Min assist Stair Management: One rail Left;Step to pattern;Forwards;With cane Number of Stairs: 3 General stair comments: cues for sequence and foot/cane placement  Wheelchair Mobility    Modified Rankin (Stroke Patients Only)       Balance                                    Cognition Arousal/Alertness: Awake/alert Behavior During Therapy: WFL for tasks assessed/performed Overall Cognitive Status: Within Functional Limits for tasks assessed                      Exercises      General Comments        Pertinent Vitals/Pain Pain  Assessment: 0-10 Pain Score: 3  Pain Location: R thigh Pain Descriptors / Indicators: Burning;Sore Pain Intervention(s): Limited activity within patient's tolerance;Monitored during session;Ice applied    Home Living Family/patient expects to be discharged to:: Private residence Living Arrangements: Spouse/significant other;Children (has 12 children) Available Help at Discharge: Family           Additional Comments: cabinet next to high commode    Prior Function Level of Independence: Independent with assistive device(s)          PT Goals (current goals can now be found in the care plan section) Acute Rehab PT Goals Patient Stated Goal: Resume previous lifestyle with decreased pain PT Goal Formulation: With patient Potential to Achieve Goals: Good Progress towards PT goals: Progressing toward goals    Frequency  7X/week    PT Plan Current plan remains appropriate    Co-evaluation             End of Session Equipment Utilized During Treatment: Gait belt Activity Tolerance: Patient tolerated treatment well Patient left: in chair;with call bell/phone within reach;with family/visitor present     Time: [id number] PT Time Calculation (min): 19 min  Charges:  $Gait Training: 8-22 mins                    G Codes:      Hannah Downs 06/27/2014, 2:34 PM

## 2014-06-29 DIAGNOSIS — Z96641 Presence of right artificial hip joint: Secondary | ICD-10-CM | POA: Diagnosis not present

## 2014-06-29 DIAGNOSIS — R269 Unspecified abnormalities of gait and mobility: Secondary | ICD-10-CM | POA: Diagnosis not present

## 2014-06-29 DIAGNOSIS — I10 Essential (primary) hypertension: Secondary | ICD-10-CM | POA: Diagnosis not present

## 2014-06-29 DIAGNOSIS — Z471 Aftercare following joint replacement surgery: Secondary | ICD-10-CM | POA: Diagnosis not present

## 2014-06-29 DIAGNOSIS — M8580 Other specified disorders of bone density and structure, unspecified site: Secondary | ICD-10-CM | POA: Diagnosis not present

## 2014-07-01 DIAGNOSIS — I10 Essential (primary) hypertension: Secondary | ICD-10-CM | POA: Diagnosis not present

## 2014-07-01 DIAGNOSIS — Z96641 Presence of right artificial hip joint: Secondary | ICD-10-CM | POA: Diagnosis not present

## 2014-07-01 DIAGNOSIS — R269 Unspecified abnormalities of gait and mobility: Secondary | ICD-10-CM | POA: Diagnosis not present

## 2014-07-01 DIAGNOSIS — Z471 Aftercare following joint replacement surgery: Secondary | ICD-10-CM | POA: Diagnosis not present

## 2014-07-01 DIAGNOSIS — M8580 Other specified disorders of bone density and structure, unspecified site: Secondary | ICD-10-CM | POA: Diagnosis not present

## 2014-07-03 DIAGNOSIS — R269 Unspecified abnormalities of gait and mobility: Secondary | ICD-10-CM | POA: Diagnosis not present

## 2014-07-03 DIAGNOSIS — Z96641 Presence of right artificial hip joint: Secondary | ICD-10-CM | POA: Diagnosis not present

## 2014-07-03 DIAGNOSIS — M8580 Other specified disorders of bone density and structure, unspecified site: Secondary | ICD-10-CM | POA: Diagnosis not present

## 2014-07-03 DIAGNOSIS — I10 Essential (primary) hypertension: Secondary | ICD-10-CM | POA: Diagnosis not present

## 2014-07-03 DIAGNOSIS — Z471 Aftercare following joint replacement surgery: Secondary | ICD-10-CM | POA: Diagnosis not present

## 2014-07-05 DIAGNOSIS — Z471 Aftercare following joint replacement surgery: Secondary | ICD-10-CM | POA: Diagnosis not present

## 2014-07-05 DIAGNOSIS — M8580 Other specified disorders of bone density and structure, unspecified site: Secondary | ICD-10-CM | POA: Diagnosis not present

## 2014-07-05 DIAGNOSIS — I10 Essential (primary) hypertension: Secondary | ICD-10-CM | POA: Diagnosis not present

## 2014-07-05 DIAGNOSIS — Z96641 Presence of right artificial hip joint: Secondary | ICD-10-CM | POA: Diagnosis not present

## 2014-07-05 DIAGNOSIS — R269 Unspecified abnormalities of gait and mobility: Secondary | ICD-10-CM | POA: Diagnosis not present

## 2014-07-08 DIAGNOSIS — R269 Unspecified abnormalities of gait and mobility: Secondary | ICD-10-CM | POA: Diagnosis not present

## 2014-07-08 DIAGNOSIS — Z96641 Presence of right artificial hip joint: Secondary | ICD-10-CM | POA: Diagnosis not present

## 2014-07-08 DIAGNOSIS — M8580 Other specified disorders of bone density and structure, unspecified site: Secondary | ICD-10-CM | POA: Diagnosis not present

## 2014-07-08 DIAGNOSIS — I10 Essential (primary) hypertension: Secondary | ICD-10-CM | POA: Diagnosis not present

## 2014-07-08 DIAGNOSIS — Z471 Aftercare following joint replacement surgery: Secondary | ICD-10-CM | POA: Diagnosis not present

## 2014-07-10 DIAGNOSIS — Z96641 Presence of right artificial hip joint: Secondary | ICD-10-CM | POA: Diagnosis not present

## 2014-07-10 DIAGNOSIS — I10 Essential (primary) hypertension: Secondary | ICD-10-CM | POA: Diagnosis not present

## 2014-07-10 DIAGNOSIS — M8580 Other specified disorders of bone density and structure, unspecified site: Secondary | ICD-10-CM | POA: Diagnosis not present

## 2014-07-10 DIAGNOSIS — Z471 Aftercare following joint replacement surgery: Secondary | ICD-10-CM | POA: Diagnosis not present

## 2014-07-10 DIAGNOSIS — R269 Unspecified abnormalities of gait and mobility: Secondary | ICD-10-CM | POA: Diagnosis not present

## 2014-07-12 DIAGNOSIS — M8580 Other specified disorders of bone density and structure, unspecified site: Secondary | ICD-10-CM | POA: Diagnosis not present

## 2014-07-12 DIAGNOSIS — I10 Essential (primary) hypertension: Secondary | ICD-10-CM | POA: Diagnosis not present

## 2014-07-12 DIAGNOSIS — Z471 Aftercare following joint replacement surgery: Secondary | ICD-10-CM | POA: Diagnosis not present

## 2014-07-12 DIAGNOSIS — Z96641 Presence of right artificial hip joint: Secondary | ICD-10-CM | POA: Diagnosis not present

## 2014-07-12 DIAGNOSIS — R269 Unspecified abnormalities of gait and mobility: Secondary | ICD-10-CM | POA: Diagnosis not present

## 2014-07-16 DIAGNOSIS — M8580 Other specified disorders of bone density and structure, unspecified site: Secondary | ICD-10-CM | POA: Diagnosis not present

## 2014-07-16 DIAGNOSIS — Z471 Aftercare following joint replacement surgery: Secondary | ICD-10-CM | POA: Diagnosis not present

## 2014-07-16 DIAGNOSIS — R269 Unspecified abnormalities of gait and mobility: Secondary | ICD-10-CM | POA: Diagnosis not present

## 2014-07-16 DIAGNOSIS — I10 Essential (primary) hypertension: Secondary | ICD-10-CM | POA: Diagnosis not present

## 2014-07-16 DIAGNOSIS — Z96641 Presence of right artificial hip joint: Secondary | ICD-10-CM | POA: Diagnosis not present

## 2014-07-18 DIAGNOSIS — R269 Unspecified abnormalities of gait and mobility: Secondary | ICD-10-CM | POA: Diagnosis not present

## 2014-07-18 DIAGNOSIS — Z96641 Presence of right artificial hip joint: Secondary | ICD-10-CM | POA: Diagnosis not present

## 2014-07-18 DIAGNOSIS — I10 Essential (primary) hypertension: Secondary | ICD-10-CM | POA: Diagnosis not present

## 2014-07-18 DIAGNOSIS — M8580 Other specified disorders of bone density and structure, unspecified site: Secondary | ICD-10-CM | POA: Diagnosis not present

## 2014-07-18 DIAGNOSIS — Z471 Aftercare following joint replacement surgery: Secondary | ICD-10-CM | POA: Diagnosis not present

## 2014-07-19 DIAGNOSIS — Z96641 Presence of right artificial hip joint: Secondary | ICD-10-CM | POA: Diagnosis not present

## 2014-07-19 DIAGNOSIS — M8580 Other specified disorders of bone density and structure, unspecified site: Secondary | ICD-10-CM | POA: Diagnosis not present

## 2014-07-19 DIAGNOSIS — I10 Essential (primary) hypertension: Secondary | ICD-10-CM | POA: Diagnosis not present

## 2014-07-19 DIAGNOSIS — R269 Unspecified abnormalities of gait and mobility: Secondary | ICD-10-CM | POA: Diagnosis not present

## 2014-07-19 DIAGNOSIS — Z471 Aftercare following joint replacement surgery: Secondary | ICD-10-CM | POA: Diagnosis not present

## 2014-07-23 DIAGNOSIS — I10 Essential (primary) hypertension: Secondary | ICD-10-CM | POA: Diagnosis not present

## 2014-07-23 DIAGNOSIS — R269 Unspecified abnormalities of gait and mobility: Secondary | ICD-10-CM | POA: Diagnosis not present

## 2014-07-23 DIAGNOSIS — Z96641 Presence of right artificial hip joint: Secondary | ICD-10-CM | POA: Diagnosis not present

## 2014-07-23 DIAGNOSIS — M8580 Other specified disorders of bone density and structure, unspecified site: Secondary | ICD-10-CM | POA: Diagnosis not present

## 2014-07-23 DIAGNOSIS — Z471 Aftercare following joint replacement surgery: Secondary | ICD-10-CM | POA: Diagnosis not present

## 2014-07-25 DIAGNOSIS — I10 Essential (primary) hypertension: Secondary | ICD-10-CM | POA: Diagnosis not present

## 2014-07-25 DIAGNOSIS — Z96641 Presence of right artificial hip joint: Secondary | ICD-10-CM | POA: Diagnosis not present

## 2014-07-25 DIAGNOSIS — M8580 Other specified disorders of bone density and structure, unspecified site: Secondary | ICD-10-CM | POA: Diagnosis not present

## 2014-07-25 DIAGNOSIS — R269 Unspecified abnormalities of gait and mobility: Secondary | ICD-10-CM | POA: Diagnosis not present

## 2014-07-25 DIAGNOSIS — Z471 Aftercare following joint replacement surgery: Secondary | ICD-10-CM | POA: Diagnosis not present

## 2014-07-30 DIAGNOSIS — Z471 Aftercare following joint replacement surgery: Secondary | ICD-10-CM | POA: Diagnosis not present

## 2014-07-30 DIAGNOSIS — Z96641 Presence of right artificial hip joint: Secondary | ICD-10-CM | POA: Diagnosis not present

## 2014-08-09 DIAGNOSIS — L02212 Cutaneous abscess of back [any part, except buttock]: Secondary | ICD-10-CM | POA: Diagnosis not present

## 2015-05-20 DIAGNOSIS — E559 Vitamin D deficiency, unspecified: Secondary | ICD-10-CM | POA: Diagnosis not present

## 2015-05-20 DIAGNOSIS — I10 Essential (primary) hypertension: Secondary | ICD-10-CM | POA: Diagnosis not present

## 2015-05-20 DIAGNOSIS — E785 Hyperlipidemia, unspecified: Secondary | ICD-10-CM | POA: Diagnosis not present

## 2015-05-20 DIAGNOSIS — Z23 Encounter for immunization: Secondary | ICD-10-CM | POA: Diagnosis not present

## 2015-06-05 DIAGNOSIS — Z471 Aftercare following joint replacement surgery: Secondary | ICD-10-CM | POA: Diagnosis not present

## 2015-06-05 DIAGNOSIS — Z96641 Presence of right artificial hip joint: Secondary | ICD-10-CM | POA: Diagnosis not present

## 2016-03-08 DIAGNOSIS — K219 Gastro-esophageal reflux disease without esophagitis: Secondary | ICD-10-CM | POA: Diagnosis not present

## 2016-03-08 DIAGNOSIS — Z79899 Other long term (current) drug therapy: Secondary | ICD-10-CM | POA: Diagnosis not present

## 2016-03-08 DIAGNOSIS — I10 Essential (primary) hypertension: Secondary | ICD-10-CM | POA: Diagnosis not present

## 2016-03-08 DIAGNOSIS — E559 Vitamin D deficiency, unspecified: Secondary | ICD-10-CM | POA: Diagnosis not present

## 2016-03-08 DIAGNOSIS — E785 Hyperlipidemia, unspecified: Secondary | ICD-10-CM | POA: Diagnosis not present

## 2016-03-08 DIAGNOSIS — F439 Reaction to severe stress, unspecified: Secondary | ICD-10-CM | POA: Diagnosis not present

## 2016-08-31 DIAGNOSIS — R8290 Unspecified abnormal findings in urine: Secondary | ICD-10-CM | POA: Diagnosis not present

## 2016-08-31 DIAGNOSIS — I10 Essential (primary) hypertension: Secondary | ICD-10-CM | POA: Diagnosis not present

## 2017-03-01 DIAGNOSIS — E785 Hyperlipidemia, unspecified: Secondary | ICD-10-CM | POA: Diagnosis not present

## 2017-03-01 DIAGNOSIS — I10 Essential (primary) hypertension: Secondary | ICD-10-CM | POA: Diagnosis not present

## 2017-03-01 DIAGNOSIS — K219 Gastro-esophageal reflux disease without esophagitis: Secondary | ICD-10-CM | POA: Diagnosis not present

## 2017-03-01 DIAGNOSIS — E559 Vitamin D deficiency, unspecified: Secondary | ICD-10-CM | POA: Diagnosis not present

## 2017-03-01 DIAGNOSIS — Z79899 Other long term (current) drug therapy: Secondary | ICD-10-CM | POA: Diagnosis not present

## 2017-03-01 DIAGNOSIS — R7301 Impaired fasting glucose: Secondary | ICD-10-CM | POA: Diagnosis not present

## 2017-09-29 DIAGNOSIS — I10 Essential (primary) hypertension: Secondary | ICD-10-CM | POA: Diagnosis not present

## 2017-09-29 DIAGNOSIS — R7303 Prediabetes: Secondary | ICD-10-CM | POA: Diagnosis not present

## 2017-09-29 DIAGNOSIS — E785 Hyperlipidemia, unspecified: Secondary | ICD-10-CM | POA: Diagnosis not present

## 2018-03-21 DIAGNOSIS — R7303 Prediabetes: Secondary | ICD-10-CM | POA: Diagnosis not present

## 2018-03-21 DIAGNOSIS — I10 Essential (primary) hypertension: Secondary | ICD-10-CM | POA: Diagnosis not present

## 2018-03-21 DIAGNOSIS — E559 Vitamin D deficiency, unspecified: Secondary | ICD-10-CM | POA: Diagnosis not present

## 2018-03-21 DIAGNOSIS — E785 Hyperlipidemia, unspecified: Secondary | ICD-10-CM | POA: Diagnosis not present

## 2018-05-08 DIAGNOSIS — E785 Hyperlipidemia, unspecified: Secondary | ICD-10-CM | POA: Diagnosis not present

## 2018-05-08 DIAGNOSIS — Z79899 Other long term (current) drug therapy: Secondary | ICD-10-CM | POA: Diagnosis not present

## 2018-09-21 DIAGNOSIS — E785 Hyperlipidemia, unspecified: Secondary | ICD-10-CM | POA: Diagnosis not present

## 2018-09-21 DIAGNOSIS — I10 Essential (primary) hypertension: Secondary | ICD-10-CM | POA: Diagnosis not present

## 2019-03-22 DIAGNOSIS — I10 Essential (primary) hypertension: Secondary | ICD-10-CM | POA: Diagnosis not present

## 2019-03-22 DIAGNOSIS — Z1389 Encounter for screening for other disorder: Secondary | ICD-10-CM | POA: Diagnosis not present

## 2019-03-22 DIAGNOSIS — E785 Hyperlipidemia, unspecified: Secondary | ICD-10-CM | POA: Diagnosis not present

## 2019-09-14 DIAGNOSIS — I480 Paroxysmal atrial fibrillation: Secondary | ICD-10-CM

## 2019-09-14 HISTORY — DX: Paroxysmal atrial fibrillation: I48.0

## 2019-09-24 DIAGNOSIS — I10 Essential (primary) hypertension: Secondary | ICD-10-CM | POA: Diagnosis not present

## 2019-09-24 DIAGNOSIS — E785 Hyperlipidemia, unspecified: Secondary | ICD-10-CM | POA: Diagnosis not present

## 2019-09-24 DIAGNOSIS — E559 Vitamin D deficiency, unspecified: Secondary | ICD-10-CM | POA: Diagnosis not present

## 2019-09-24 DIAGNOSIS — Z7189 Other specified counseling: Secondary | ICD-10-CM | POA: Diagnosis not present

## 2020-02-21 DIAGNOSIS — I10 Essential (primary) hypertension: Secondary | ICD-10-CM | POA: Diagnosis not present

## 2020-02-21 DIAGNOSIS — E559 Vitamin D deficiency, unspecified: Secondary | ICD-10-CM | POA: Diagnosis not present

## 2020-02-21 DIAGNOSIS — E785 Hyperlipidemia, unspecified: Secondary | ICD-10-CM | POA: Diagnosis not present

## 2020-02-21 DIAGNOSIS — R002 Palpitations: Secondary | ICD-10-CM | POA: Diagnosis not present

## 2020-02-22 ENCOUNTER — Ambulatory Visit (INDEPENDENT_AMBULATORY_CARE_PROVIDER_SITE_OTHER): Payer: Medicare Other | Admitting: Cardiology

## 2020-02-22 ENCOUNTER — Encounter: Payer: Self-pay | Admitting: Cardiology

## 2020-02-22 ENCOUNTER — Other Ambulatory Visit: Payer: Self-pay

## 2020-02-22 ENCOUNTER — Ambulatory Visit (INDEPENDENT_AMBULATORY_CARE_PROVIDER_SITE_OTHER): Payer: Medicare Other

## 2020-02-22 VITALS — BP 129/86 | HR 80 | Ht 60.0 in | Wt 123.2 lb

## 2020-02-22 DIAGNOSIS — I10 Essential (primary) hypertension: Secondary | ICD-10-CM

## 2020-02-22 DIAGNOSIS — R002 Palpitations: Secondary | ICD-10-CM

## 2020-02-22 DIAGNOSIS — E7800 Pure hypercholesterolemia, unspecified: Secondary | ICD-10-CM

## 2020-02-22 NOTE — Progress Notes (Signed)
Cardiology Office Note:    Date:  02/22/2020   ID:  MARGHERITA HOSP, DOB 05/01/1946, MRN [id number]  PCP:  [name], MD  [facility] Cardiologist:  Debbe Odea, MD  [facility] Electrophysiologist:  None   Referring MD: [name], MD   Chief Complaint  Patient presents with  . New Patient (Initial Visit)    Patient reports syncopal episode about one week ago, dizziness, and palpitations; Meds verbally reviewed with patient.      History of Present Illness:    Hannah Downs is a 74 y.o. female with a hx of hypertension, hyperlipidemia, who presents due to palpitations.  Patient states having symptoms of palpitations for about a year now on and off.  Over the past week, she has noticed more severe symptoms with palpitations lasting longer and associated with dizziness and near syncope.  She states having severe episodes a week ago while she was at home.  She felt her heart racing fast for the entire day.  She also felt dizzy during this episode.  Symptoms subsided the next day and she was okay for the next 2 days.  Symptoms return on the third day but did not last as long.  She denies any history of heart disease, denies shortness of breath or chest pain at rest or with exertion.  Past Medical History:  Diagnosis Date  . Arthritis   . GERD (gastroesophageal reflux disease)   . Hypercholesterolemia   . Hypertension   . Lumbar degenerative disc disease   . Miscarriage    times 3  . Osteopenia   . Seasonal allergies   . Varicose vein    right leg  . Vitamin D deficiency     Past Surgical History:  Procedure Laterality Date  . DILATION AND CURETTAGE OF UTERUS     hx of approx 26 years ago  . HERNIA REPAIR     hx of in left groin times 2  . 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 atorvastatin (LIPITOR) 20 MG tablet Take 20 mg by mouth daily.  . Famotidine (PEPCID AC PO) Take 1 tablet by mouth at bedtime as needed.  . hydrochlorothiazide (HYDRODIURIL) 25 MG tablet Take 25 mg by mouth daily.  Marland Kitchen loratadine (CLARITIN) 10 MG tablet Take 10 mg by mouth daily.  . metoprolol succinate (TOPROL-XL) 100 MG 24 hr tablet Take 100 mg by mouth daily. Take with or immediately following a meal.     Allergies:   Patient has no known allergies.   Social History   Socioeconomic History  . Marital status: Married    Spouse name: Not on file  . Number of children: Not on file  . Years of education: Not on file  . Highest education level: Not on file  Occupational History  . Not on file  Tobacco Use  . Smoking status: Never Smoker  . Smokeless tobacco: Never Used  Substance and Sexual Activity  . Alcohol use: No  . Drug use: No  . Sexual activity: Yes    Birth control/protection: Patch  Other Topics Concern  . Not on file  Social History Narrative  . Not on file   Social Determinants of Health   Financial Resource Strain:   . Difficulty of Paying  Living Expenses:   Food Insecurity:   . Worried About Programme researcher, broadcasting/film/video in the Last Year:   . Barista in the Last Year:   Transportation Needs:   . Freight forwarder (Medical):   Marland Kitchen Lack of Transportation (Non-Medical):   Physical Activity:   . Days of Exercise per Week:   . Minutes of Exercise per Session:   Stress:   . Feeling of Stress :   Social Connections:   . Frequency of Communication with Friends and Family:   . Frequency of Social Gatherings with Friends and Family:   . Attends Religious Services:   . Active Member of Clubs or Organizations:   . Attends Banker Meetings:   Marland Kitchen Marital Status:      Family History: The patient's family history is not on file.  ROS:   Please see the history of present  illness.     All other systems reviewed and are negative.  EKGs/Labs/Other Studies Reviewed:    The following studies were reviewed today:   EKG:  EKG is  ordered today.  The ekg ordered today demonstrates sinus rhythm, occasional premature supraventricular complexes  Recent Labs: No results found for requested labs within last 8760 hours.  Recent Lipid Panel No results found for: CHOL, TRIG, HDL, CHOLHDL, VLDL, LDLCALC, LDLDIRECT  Physical Exam:    VS:  BP 129/86 (BP Location: Right Arm, Patient Position: Sitting, Cuff Size: Normal)   Pulse 80   Ht 5' (1.524 m)   Wt 123 lb 4 oz (55.9 kg)   SpO2 95%   BMI 24.07 kg/m     Wt Readings from Last 3 Encounters:  02/22/20 123 lb 4 oz (55.9 kg)  06/26/14 124 lb 8 oz (56.5 kg)  06/18/14 124 lb 8 oz (56.5 kg)     GEN:  Well nourished, well developed in no acute distress HEENT: Normal NECK: No JVD; No carotid bruits LYMPHATICS: No lymphadenopathy CARDIAC: RRR, no murmurs, rubs, gallops RESPIRATORY:  Clear to auscultation without rales, wheezing or rhonchi  ABDOMEN: Soft, non-tender, non-distended MUSCULOSKELETAL:  No edema; No deformity  SKIN: Warm and dry NEUROLOGIC:  Alert and oriented x 3 PSYCHIATRIC:  Normal affect   ASSESSMENT:    1. Palpitations   2. Essential hypertension   3. Pure hypercholesterolemia    PLAN:    In order of problems listed above:  1. Patient with history of palpitations associated with dizziness.  We will place a 2-week cardiac monitor to evaluate for any significant arrhythmias. 2. History of hypertension, blood pressure well controlled.  Continue HCTZ and metoprolol as prescribed. 3. History of hyperlipidemia, on statin.  Continue statin.  Follow-up after cardiac monitor  This note was generated in part or whole with voice recognition software. Voice recognition is usually quite accurate but there are transcription errors that can and very often do occur. I apologize for any typographical  errors that were not detected and corrected.  Medication Adjustments/Labs and Tests Ordered: Current medicines are reviewed at length with the patient today.  Concerns regarding medicines are outlined above.  Orders Placed This Encounter  Procedures  . LONG TERM MONITOR (3-14 DAYS)  . EKG 12-Lead   No orders of the defined types were placed in this encounter.   Patient Instructions  Medication Instructions:  No Changes  *If you need a refill on your cardiac medications before your next appointment, please call your pharmacy*   Lab Work: None  If you have  labs (blood work) drawn today and your tests are completely normal, you will receive your results only by: Marland Kitchen MyChart Message (if you have MyChart) OR . A paper copy in the mail If you have any lab test that is abnormal or we need to change your treatment, we will call you to review the results.   Testing/Procedures: Your physician has recommended that you wear a Zio monitor. This monitor is a medical device that records the heart's electrical activity. Doctors most often use these monitors to diagnose arrhythmias. Arrhythmias are problems with the speed or rhythm of the heartbeat. The monitor is a small device applied to your chest. You can wear one while you do your normal daily activities. While wearing this monitor if you have any symptoms to push the button and record what you felt. Once you have worn this monitor for the period of time provider prescribed (Usually 14 days), you will return the monitor device in the postage paid box. Once it is returned they will download the data collected and provide [place] with a report which the provider will then review and we will call you with those results. Important tips:  1. Avoid showering during the first 24 hours of wearing the monitor. 2. Avoid excessive sweating to help maximize wear time. 3. Do not submerge the device, no hot tubs, and no swimming pools. 4. Keep any lotions or oils  away from the patch. 5. After 24 hours you may shower with the patch on. Take brief showers with your back facing the shower head.  6. Do not remove patch once it has been placed because that will interrupt data and decrease adhesive wear time. 7. Push the button when you have any symptoms and write down what you were feeling. 8. Once you have completed wearing your monitor, remove and place into box which has postage paid and place in your outgoing mailbox.  9. If for some reason you have misplaced your box then call our office and we can provide another box and/or mail it off for you.         Follow-Up: At [facility], you and your health needs are our priority.  As part of our continuing mission to provide you with exceptional heart care, we have created designated Provider Care Teams.  These Care Teams include your primary Cardiologist (physician) and Advanced Practice Providers (APPs -  Physician Assistants and Nurse Practitioners) who all work together to provide you with the care you need, when you need it.    Your next appointment:   1 month(s)  The format for your next appointment:   In Person  Provider:    You may see Debbe Odea, MD or one of the following Advanced Practice Providers on your designated Care Team:    Nicolasa Ducking, NP  Eula Listen, PA-C  Marisue Ivan, PA-C     Signed, Debbe Odea, MD  02/22/2020 4:56 PM    [facility]

## 2020-02-22 NOTE — Patient Instructions (Signed)
Medication Instructions:  No Changes  *If you need a refill on your cardiac medications before your next appointment, please call your pharmacy*   Lab Work: None  If you have labs (blood work) drawn today and your tests are completely normal, you will receive your results only by: Marland Kitchen MyChart Message (if you have MyChart) OR . A paper copy in the mail If you have any lab test that is abnormal or we need to change your treatment, we will call you to review the results.   Testing/Procedures: Your physician has recommended that you wear a Zio monitor. This monitor is a medical device that records the heart's electrical activity. Doctors most often use these monitors to diagnose arrhythmias. Arrhythmias are problems with the speed or rhythm of the heartbeat. The monitor is a small device applied to your chest. You can wear one while you do your normal daily activities. While wearing this monitor if you have any symptoms to push the button and record what you felt. Once you have worn this monitor for the period of time provider prescribed (Usually 14 days), you will return the monitor device in the postage paid box. Once it is returned they will download the data collected and provide [place] with a report which the provider will then review and we will call you with those results. Important tips:  1. Avoid showering during the first 24 hours of wearing the monitor. 2. Avoid excessive sweating to help maximize wear time. 3. Do not submerge the device, no hot tubs, and no swimming pools. 4. Keep any lotions or oils away from the patch. 5. After 24 hours you may shower with the patch on. Take brief showers with your back facing the shower head.  6. Do not remove patch once it has been placed because that will interrupt data and decrease adhesive wear time. 7. Push the button when you have any symptoms and write down what you were feeling. 8. Once you have completed wearing your monitor, remove and place  into box which has postage paid and place in your outgoing mailbox.  9. If for some reason you have misplaced your box then call our office and we can provide another box and/or mail it off for you.         Follow-Up: At [facility], you and your health needs are our priority.  As part of our continuing mission to provide you with exceptional heart care, we have created designated Provider Care Teams.  These Care Teams include your primary Cardiologist (physician) and Advanced Practice Providers (APPs -  Physician Assistants and Nurse Practitioners) who all work together to provide you with the care you need, when you need it.    Your next appointment:   1 month(s)  The format for your next appointment:   In Person  Provider:    You may see Debbe Odea, MD or one of the following Advanced Practice Providers on your designated Care Team:    Nicolasa Ducking, NP  Eula Listen, PA-C  Marisue Ivan, PA-C

## 2020-03-24 ENCOUNTER — Encounter: Payer: Medicare Other | Admitting: Cardiology

## 2020-03-24 ENCOUNTER — Encounter: Payer: Self-pay | Admitting: Cardiology

## 2020-03-24 ENCOUNTER — Other Ambulatory Visit: Payer: Self-pay

## 2020-03-24 ENCOUNTER — Telehealth: Payer: Self-pay | Admitting: Cardiology

## 2020-03-24 NOTE — Telephone Encounter (Signed)
°  Patient Consent for Virtual Visit         Hannah Downs has provided verbal consent on 03/24/2020 for a virtual visit (video or telephone).   CONSENT FOR VIRTUAL VISIT FOR:  Hannah Downs  By participating in this virtual visit I agree to the following:  I hereby voluntarily request, consent and authorize [facility] and its employed or contracted physicians, Producer, television/film/video, nurse practitioners or other licensed health care professionals (the Practitioner), to provide me with telemedicine health care services (the Services") as deemed necessary by the treating Practitioner. I acknowledge and consent to receive the Services by the Practitioner via telemedicine. I understand that the telemedicine visit will involve communicating with the Practitioner through live audiovisual communication technology and the disclosure of certain medical information by electronic transmission. I acknowledge that I have been given the opportunity to request an in-person assessment or other available alternative prior to the telemedicine visit and am voluntarily participating in the telemedicine visit.  I understand that I have the right to withhold or withdraw my consent to the use of telemedicine in the course of my care at any time, without affecting my right to future care or treatment, and that the Practitioner or I may terminate the telemedicine visit at any time. I understand that I have the right to inspect all information obtained and/or recorded in the course of the telemedicine visit and may receive copies of available information for a reasonable fee.  I understand that some of the potential risks of receiving the Services via telemedicine include:   Delay or interruption in medical evaluation due to technological equipment failure or disruption;  Information transmitted may not be sufficient (e.g. poor resolution of images) to allow for appropriate medical decision making by the  Practitioner; and/or   In rare instances, security protocols could fail, causing a breach of personal health information.  Furthermore, I acknowledge that it is my responsibility to provide information about my medical history, conditions and care that is complete and accurate to the best of my ability. I acknowledge that Practitioner's advice, recommendations, and/or decision may be based on factors not within their control, such as incomplete or inaccurate data provided by me or distortions of diagnostic images or specimens that may result from electronic transmissions. I understand that the practice of medicine is not an exact science and that Practitioner makes no warranties or guarantees regarding treatment outcomes. I acknowledge that a copy of this consent can be made available to me via my patient portal [facility] MyChart), or I can request a printed copy by calling the office of [facility].    I understand that my insurance will be billed for this visit.   I have read or had this consent read to me.  I understand the contents of this consent, which adequately explains the benefits and risks of the Services being provided via telemedicine.   I have been provided ample opportunity to ask questions regarding this consent and the Services and have had my questions answered to my satisfaction.  I give my informed consent for the services to be provided through the use of telemedicine in my medical care

## 2020-03-26 DIAGNOSIS — R002 Palpitations: Secondary | ICD-10-CM | POA: Diagnosis not present

## 2020-03-26 NOTE — Progress Notes (Signed)
This encounter was created in error - please disregard.

## 2020-03-27 ENCOUNTER — Telehealth (HOSPITAL_COMMUNITY): Payer: Self-pay | Admitting: Radiology

## 2020-03-27 NOTE — Telephone Encounter (Signed)
-----   Message from Debbe Odea, MD sent at 03/26/2020  5:19 PM EDT ----- Atrial fibrillation noted on cardiac monitor. Please order echocardiogram, start Eliquis 5 mg twice daily. Schedule earlier follow-up appointment with myself if possible.  Thank you

## 2020-03-27 NOTE — Telephone Encounter (Signed)
Spoke with patient and discussed results of Zio monitor with her. She stated that her husband has had atrial fibrillation for some time now and she is very familiar with it. She knows her insurance will not cover the Eliquis so she prefers using Warfarin like her husband. I spoke to Dr. Azucena Cecil and he gave verbal orders to refer to coumadin clinic. Patient also lives in [place] so she would like to transfer her care to Dr. Elease Hashimoto, who her husband sees and use their Coumadin Clinic with her husband. She has already reached out to their clinic and I will forward this message to them as well. Her echo has been scheduled at [facility] [address] for [employer].7/19.

## 2020-03-27 NOTE — Telephone Encounter (Signed)
Patient needs to be seen in [place] at the [address] location due to transportation. Please call patient. Her husband in seen at the [facility] street location with Dr. Elease Hashimoto. She doesn't drive and relies on her daughter.

## 2020-03-27 NOTE — Telephone Encounter (Signed)
Called patient several times and her line is busy. Her home listed number, [phone] (H) is not working. Will continue to try.

## 2020-03-28 ENCOUNTER — Other Ambulatory Visit (HOSPITAL_COMMUNITY): Payer: Medicare Other

## 2020-03-28 MED ORDER — WARFARIN SODIUM 5 MG PO TABS: 5.0000 mg | ORAL_TABLET | Freq: Every day | ORAL | 0 refills | Status: DC

## 2020-03-28 NOTE — Telephone Encounter (Addendum)
Spoke with [employer] D. Will start pt on Warfarin 5 mg.   Called and spoke to pt. Instructed her to start taking Warfarin 5mg   (1 tablet) daily. Pt stated she will start taking Warfarin today (7/16). Pt will come in on 7/21 to the coumadin clinic to have INR checked.  Pt verbalized understanding. Pt confirmed she is not taking Xarelto and took off pt's med list.

## 2020-03-28 NOTE — Addendum Note (Signed)
Addended by: Mellody Dance B on: 03/28/2020 12:00 PM   Modules accepted: Orders

## 2020-03-31 ENCOUNTER — Telehealth: Payer: Self-pay | Admitting: Cardiology

## 2020-03-31 NOTE — Telephone Encounter (Signed)
New Message   Pt is requesting to switch provider from Dr. Azucena Cecil to Dr. Elease Hashimoto. She said her husband been seeing Dr. Elease Hashimoto for 15 years and would like to have same cards with him.

## 2020-04-01 ENCOUNTER — Other Ambulatory Visit (HOSPITAL_COMMUNITY): Payer: Self-pay | Admitting: Cardiology

## 2020-04-01 ENCOUNTER — Other Ambulatory Visit: Payer: Self-pay

## 2020-04-01 ENCOUNTER — Ambulatory Visit (HOSPITAL_COMMUNITY): Payer: Medicare Other | Attending: Cardiology

## 2020-04-01 DIAGNOSIS — I4891 Unspecified atrial fibrillation: Secondary | ICD-10-CM

## 2020-04-01 LAB — ECHOCARDIOGRAM COMPLETE
Area-P 1/2: 2.06 cm2
S' Lateral: 2.3 cm

## 2020-04-02 ENCOUNTER — Ambulatory Visit (INDEPENDENT_AMBULATORY_CARE_PROVIDER_SITE_OTHER): Payer: Medicare Other | Admitting: *Deleted

## 2020-04-02 DIAGNOSIS — Z5181 Encounter for therapeutic drug level monitoring: Secondary | ICD-10-CM | POA: Insufficient documentation

## 2020-04-02 DIAGNOSIS — I480 Paroxysmal atrial fibrillation: Secondary | ICD-10-CM | POA: Insufficient documentation

## 2020-04-02 DIAGNOSIS — I4891 Unspecified atrial fibrillation: Secondary | ICD-10-CM | POA: Diagnosis not present

## 2020-04-02 LAB — POCT INR: INR: 1.7 — AB (ref 2.0–3.0)

## 2020-04-02 NOTE — Patient Instructions (Addendum)
  Description   Take 1.5 tablets today and then start taking 1 tablet daily. Recheck INR in 1 week. Anticoagulation Clinic [phone] Main [phone]    A full discussion of the nature of anticoagulants has been carried out.  A benefit risk analysis has been presented to the patient, so that they understand the justification for choosing anticoagulation at this time. The need for frequent and regular monitoring, precise dosage adjustment and compliance is stressed.  Side effects of potential bleeding are discussed.  The patient should avoid any OTC items containing aspirin or ibuprofen, and should avoid great swings in general diet.  Avoid alcohol consumption.  Call if any signs of abnormal bleeding.

## 2020-04-04 ENCOUNTER — Telehealth: Payer: Medicare Other | Admitting: Cardiology

## 2020-04-07 NOTE — Telephone Encounter (Signed)
Patient was calling to follow up on the provider change request

## 2020-04-10 NOTE — Telephone Encounter (Signed)
Fine with me

## 2020-04-10 NOTE — Telephone Encounter (Signed)
Follow up   Both providers needs approval   Please advise 

## 2020-04-11 ENCOUNTER — Other Ambulatory Visit: Payer: Self-pay

## 2020-04-11 ENCOUNTER — Ambulatory Visit (INDEPENDENT_AMBULATORY_CARE_PROVIDER_SITE_OTHER): Payer: Medicare Other

## 2020-04-11 DIAGNOSIS — Z5181 Encounter for therapeutic drug level monitoring: Secondary | ICD-10-CM

## 2020-04-11 DIAGNOSIS — I4891 Unspecified atrial fibrillation: Secondary | ICD-10-CM | POA: Diagnosis not present

## 2020-04-11 LAB — POCT INR: INR: 2.2 (ref 2.0–3.0)

## 2020-04-11 NOTE — Patient Instructions (Signed)
Continue taking 1 tablet daily. Recheck INR in 1 week. Anticoagulation Clinic [phone] Main [phone]

## 2020-04-17 ENCOUNTER — Ambulatory Visit (INDEPENDENT_AMBULATORY_CARE_PROVIDER_SITE_OTHER): Payer: Medicare Other | Admitting: *Deleted

## 2020-04-17 ENCOUNTER — Other Ambulatory Visit: Payer: Self-pay

## 2020-04-17 DIAGNOSIS — I4891 Unspecified atrial fibrillation: Secondary | ICD-10-CM | POA: Diagnosis not present

## 2020-04-17 DIAGNOSIS — Z5181 Encounter for therapeutic drug level monitoring: Secondary | ICD-10-CM | POA: Diagnosis not present

## 2020-04-17 LAB — POCT INR: INR: 2.2 (ref 2.0–3.0)

## 2020-04-17 MED ORDER — WARFARIN SODIUM 5 MG PO TABS: 5.0000 mg | ORAL_TABLET | Freq: Every day | ORAL | 0 refills | Status: DC

## 2020-04-17 NOTE — Patient Instructions (Signed)
Description   Continue taking 1 tablet daily. Recheck INR in 1 week. Anticoagulation Clinic [phone] Main [phone]

## 2020-04-24 ENCOUNTER — Other Ambulatory Visit: Payer: Self-pay

## 2020-04-24 ENCOUNTER — Ambulatory Visit (INDEPENDENT_AMBULATORY_CARE_PROVIDER_SITE_OTHER): Payer: Medicare Other | Admitting: Pharmacist

## 2020-04-24 DIAGNOSIS — Z5181 Encounter for therapeutic drug level monitoring: Secondary | ICD-10-CM

## 2020-04-24 DIAGNOSIS — I4891 Unspecified atrial fibrillation: Secondary | ICD-10-CM

## 2020-04-24 LAB — POCT INR: INR: 2.1 (ref 2.0–3.0)

## 2020-04-24 NOTE — Patient Instructions (Addendum)
Description   Continue taking 1 tablet daily. Recheck INR in 2 weeks. Anticoagulation Clinic [phone] Main [phone]

## 2020-05-08 ENCOUNTER — Ambulatory Visit (INDEPENDENT_AMBULATORY_CARE_PROVIDER_SITE_OTHER): Payer: Medicare Other

## 2020-05-08 ENCOUNTER — Other Ambulatory Visit: Payer: Self-pay

## 2020-05-08 DIAGNOSIS — Z5181 Encounter for therapeutic drug level monitoring: Secondary | ICD-10-CM

## 2020-05-08 DIAGNOSIS — I4891 Unspecified atrial fibrillation: Secondary | ICD-10-CM

## 2020-05-08 LAB — POCT INR: INR: 2.2 (ref 2.0–3.0)

## 2020-05-08 NOTE — Patient Instructions (Signed)
Description   Continue taking 1 tablet daily. Recheck INR in 2-3 weeks. Anticoagulation Clinic [phone] Main [phone]

## 2020-05-19 ENCOUNTER — Encounter: Payer: Self-pay | Admitting: Cardiovascular Disease

## 2020-05-19 NOTE — Progress Notes (Signed)
Cardiology Office Note:    Date:  05/20/2020   ID:  Hannah Downs, DOB [birth year], MRN [id number]  PCP:  [name], MD  [facility] Cardiologist:  Adrien Dietzman  [facility] Electrophysiologist:  None   Referring MD: [name], MD   Chief Complaint  Patient presents with  . Atrial Fibrillation    Sept. 7, 2021:   Hannah Downs is a 74 y.o. female with a hx of HTN, HLD, PAF  She had been complaining of palpitations and an event monitor showed atrial fib .  She was originally prescribed Eliquis but is now on Coumadin due to cost   She was originally seen in [place] but would like to switch to see me since they live here and  I see her husband .  Has been having palpitations of the past year.  Has had some dizziness,   Fell on the bathroom floor.  Her HR seemed to be normal after she passed. Out   Was seen in the [place] office.  Orthostatic BP were checked, Zio monitor showed Afib   Has paroxysmal AFib Can last for 30 minutes.  Labs from Dr. Cliffton Asters were reviewed Potassium is 4.4.   Hb is 14. 5   Echo in July show normal LV function. Grade 2 diastolic dysfunction  She is feeling better now that she is on metoprolol    Past Medical History:  Diagnosis Date  . Arthritis   . GERD (gastroesophageal reflux disease)   . Hypercholesterolemia   . Hypertension   . Lumbar degenerative disc disease   . Miscarriage    times 3  . Osteopenia   . Seasonal allergies   . Varicose vein    right leg  . Vitamin D deficiency     Past Surgical History:  Procedure Laterality Date  . DILATION AND CURETTAGE OF UTERUS     hx of approx 26 years ago  . HERNIA REPAIR     hx of in left groin times 2  . 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 atorvastatin (LIPITOR) 20 MG tablet Take 1 tablet (20 mg total) by mouth daily.  . Famotidine (PEPCID AC PO) Take 1 tablet by mouth at bedtime as needed.  . hydrochlorothiazide (HYDRODIURIL) 25 MG tablet Take 1 tablet (25 mg total) by mouth daily.  . metoprolol succinate (TOPROL-XL) 100 MG 24 hr tablet Take 1 tablet (100 mg total) by mouth daily. Take with or immediately following a meal.  . warfarin (COUMADIN) 5 MG tablet Take 1 tablet (5 mg total) by mouth daily.  . [DISCONTINUED] atorvastatin (LIPITOR) 20 MG tablet Take 20 mg by mouth daily.  . [DISCONTINUED] hydrochlorothiazide (HYDRODIURIL) 25 MG tablet Take 25 mg by mouth daily.  . [DISCONTINUED] metoprolol succinate (TOPROL-XL) 100 MG 24 hr tablet Take 100 mg by mouth daily. Take with or immediately following a meal.     Allergies:   Patient has no known allergies.   Social History   Socioeconomic History  . Marital status: Married    Spouse name: Not on file  . Number of children: Not on file  . Years of education: Not on file  . Highest education level: Not on file  Occupational History  . Not on file  Tobacco Use  .  Smoking status: Never Smoker  . Smokeless tobacco: Never Used  Substance and Sexual Activity  . Alcohol use: No  . Drug use: No  . Sexual activity: Yes    Birth control/protection: Patch  Other Topics Concern  . Not on file  Social History Narrative  . Not on file   Social Determinants of Health   Financial Resource Strain:   . Difficulty of Paying Living Expenses: Not on file  Food Insecurity:   . Worried About Programme researcher, broadcasting/film/video in the Last Year: Not on file  . Ran Out of Food in the Last Year: Not on file  Transportation Needs:   . Lack of Transportation (Medical): Not on file  . Lack of Transportation (Non-Medical): Not on file  Physical Activity:   . Days of Exercise per Week: Not on file  . Minutes of Exercise per Session: Not on file  Stress:   . Feeling of Stress :  Not on file  Social Connections:   . Frequency of Communication with Friends and Family: Not on file  . Frequency of Social Gatherings with Friends and Family: Not on file  . Attends Religious Services: Not on file  . Active Member of Clubs or Organizations: Not on file  . Attends Banker Meetings: Not on file  . Marital Status: Not on file     Family History: The patient's family history includes Atrial fibrillation in her father; Cancer in her mother; Stroke in her father.  ROS:   Please see the history of present illness.     All other systems reviewed and are negative.  EKGs/Labs/Other Studies Reviewed:       EKG:    Recent Labs: No results found for requested labs within last 8760 hours.  Recent Lipid Panel No results found for: CHOL, TRIG, HDL, CHOLHDL, VLDL, LDLCALC, LDLDIRECT  Physical Exam:    VS:  BP (!) 142/88   Pulse 64   Ht 5' (1.524 m)   Wt 123 lb 3.2 oz (55.9 kg)   BMI 24.06 kg/m     Wt Readings from Last 3 Encounters:  05/20/20 123 lb 3.2 oz (55.9 kg)  02/22/20 123 lb 4 oz (55.9 kg)  06/26/14 124 lb 8 oz (56.5 kg)     GEN   Elderly female,  NAD  HEENT: Normal NECK: No JVD; No carotid bruits LYMPHATICS: No lymphadenopathy CARDIAC: RRR, ,  Occasional PACs  RESPIRATORY:  Clear to auscultation without rales, wheezing or rhonchi  ABDOMEN: Soft, non-tender, non-distended MUSCULOSKELETAL:  No edema; No deformity  SKIN: Warm and dry NEUROLOGIC:  Alert and oriented x 3 PSYCHIATRIC:  Normal affect   ASSESSMENT:    No diagnosis found. PLAN:       1. Paroxysmal atrial fib: Orlie Pollen presents with PAF.  She is now on warfarin.  INR levels have been theraputic  We discussed options to help suppress her episodes of AF - discussed low dose immediate release propranolol or diltiazem   She seems to be doing better on the toprol XL so she does not want to take any additional meds at this point  Will see her again in 6 months .      Medication Adjustments/Labs and Tests Ordered: Current medicines are reviewed at length with the patient today.  Concerns regarding medicines are outlined above.  No orders of the defined types were placed in this encounter.  Meds ordered this encounter  Medications  . atorvastatin (LIPITOR) 20 MG tablet  Sig: Take 1 tablet (20 mg total) by mouth daily.    Dispense:  90 tablet    Refill:  3  . hydrochlorothiazide (HYDRODIURIL) 25 MG tablet    Sig: Take 1 tablet (25 mg total) by mouth daily.    Dispense:  90 tablet    Refill:  3  . metoprolol succinate (TOPROL-XL) 100 MG 24 hr tablet    Sig: Take 1 tablet (100 mg total) by mouth daily. Take with or immediately following a meal.    Dispense:  90 tablet    Refill:  3    Patient Instructions  Medication Instructions:  Your physician recommends that you continue on your current medications as directed. Please refer to the Current Medication list given to you today.  *If you need a refill on your cardiac medications before your next appointment, please call your pharmacy*   Lab Work: None Ordered If you have labs (blood work) drawn today and your tests are completely normal, you will receive your results only by: Marland Kitchen MyChart Message (if you have MyChart) OR . A paper copy in the mail If you have any lab test that is abnormal or we need to change your treatment, we will call you to review the results.   Testing/Procedures: None Ordered   Follow-Up: At [facility], you and your health needs are our priority.  As part of our continuing mission to provide you with exceptional heart care, we have created designated Provider Care Teams.  These Care Teams include your primary Cardiologist (physician) and Advanced Practice Providers (APPs -  Physician Assistants and Nurse Practitioners) who all work together to provide you with the care you need, when you need it.   Your next appointment:   6 month(s) on Friday February  4  The format for your next appointment:   In Person  Provider:   Kristeen Miss, MD        Signed, Kristeen Miss, MD  05/20/2020 5:26 PM    [facility]

## 2020-05-20 ENCOUNTER — Encounter: Payer: Self-pay | Admitting: Cardiovascular Disease

## 2020-05-20 ENCOUNTER — Other Ambulatory Visit: Payer: Self-pay

## 2020-05-20 ENCOUNTER — Ambulatory Visit (INDEPENDENT_AMBULATORY_CARE_PROVIDER_SITE_OTHER): Payer: Medicare Other | Admitting: *Deleted

## 2020-05-20 ENCOUNTER — Ambulatory Visit (INDEPENDENT_AMBULATORY_CARE_PROVIDER_SITE_OTHER): Payer: Medicare Other | Admitting: Cardiovascular Disease

## 2020-05-20 VITALS — BP 142/88 | HR 64 | Ht 60.0 in | Wt 123.2 lb

## 2020-05-20 DIAGNOSIS — I4891 Unspecified atrial fibrillation: Secondary | ICD-10-CM | POA: Diagnosis not present

## 2020-05-20 DIAGNOSIS — I480 Paroxysmal atrial fibrillation: Secondary | ICD-10-CM

## 2020-05-20 DIAGNOSIS — Z5181 Encounter for therapeutic drug level monitoring: Secondary | ICD-10-CM | POA: Diagnosis not present

## 2020-05-20 LAB — POCT INR: INR: 2 (ref 2.0–3.0)

## 2020-05-20 MED ORDER — ATORVASTATIN CALCIUM 20 MG PO TABS: 20.0000 mg | ORAL_TABLET | Freq: Every day | ORAL | 3 refills | Status: DC

## 2020-05-20 MED ORDER — WARFARIN SODIUM 5 MG PO TABS: 5.0000 mg | ORAL_TABLET | Freq: Every day | ORAL | 1 refills | Status: DC

## 2020-05-20 MED ORDER — HYDROCHLOROTHIAZIDE 25 MG PO TABS: 25.0000 mg | ORAL_TABLET | Freq: Every day | ORAL | 3 refills | Status: DC

## 2020-05-20 MED ORDER — METOPROLOL SUCCINATE ER 100 MG PO TB24: 100.0000 mg | ORAL_TABLET | Freq: Every day | ORAL | 3 refills | Status: DC

## 2020-05-20 NOTE — Patient Instructions (Signed)
Medication Instructions:  Your physician recommends that you continue on your current medications as directed. Please refer to the Current Medication list given to you today.  *If you need a refill on your cardiac medications before your next appointment, please call your pharmacy*   Lab Work: None Ordered If you have labs (blood work) drawn today and your tests are completely normal, you will receive your results only by: Marland Kitchen MyChart Message (if you have MyChart) OR . A paper copy in the mail If you have any lab test that is abnormal or we need to change your treatment, we will call you to review the results.   Testing/Procedures: None Ordered   Follow-Up: At [facility], you and your health needs are our priority.  As part of our continuing mission to provide you with exceptional heart care, we have created designated Provider Care Teams.  These Care Teams include your primary Cardiologist (physician) and Advanced Practice Providers (APPs -  Physician Assistants and Nurse Practitioners) who all work together to provide you with the care you need, when you need it.   Your next appointment:   6 month(s) on Friday February 4  The format for your next appointment:   In Person  Provider:   Kristeen Miss, MD

## 2020-05-20 NOTE — Patient Instructions (Signed)
Description   Continue taking 1 tablet daily. Recheck INR in 4 weeks. Anticoagulation Clinic [phone] Main [phone]

## 2020-06-17 ENCOUNTER — Other Ambulatory Visit: Payer: Self-pay

## 2020-06-17 ENCOUNTER — Ambulatory Visit (INDEPENDENT_AMBULATORY_CARE_PROVIDER_SITE_OTHER): Payer: Medicare Other | Admitting: *Deleted

## 2020-06-17 DIAGNOSIS — I4891 Unspecified atrial fibrillation: Secondary | ICD-10-CM | POA: Diagnosis not present

## 2020-06-17 DIAGNOSIS — Z5181 Encounter for therapeutic drug level monitoring: Secondary | ICD-10-CM

## 2020-06-17 LAB — POCT INR: INR: 2.1 (ref 2.0–3.0)

## 2020-06-17 NOTE — Patient Instructions (Signed)
Description   Continue taking 1 tablet daily. Recheck INR in 5 weeks. Anticoagulation Clinic [phone] Main [phone]

## 2020-07-16 ENCOUNTER — Other Ambulatory Visit: Payer: Self-pay | Admitting: Cardiovascular Disease

## 2020-07-22 ENCOUNTER — Ambulatory Visit (INDEPENDENT_AMBULATORY_CARE_PROVIDER_SITE_OTHER): Payer: Medicare Other | Admitting: *Deleted

## 2020-07-22 ENCOUNTER — Other Ambulatory Visit: Payer: Self-pay

## 2020-07-22 DIAGNOSIS — I4891 Unspecified atrial fibrillation: Secondary | ICD-10-CM | POA: Diagnosis not present

## 2020-07-22 DIAGNOSIS — Z5181 Encounter for therapeutic drug level monitoring: Secondary | ICD-10-CM | POA: Diagnosis not present

## 2020-07-22 LAB — POCT INR: INR: 2.4 (ref 2.0–3.0)

## 2020-07-22 NOTE — Patient Instructions (Signed)
Description   Continue taking 1 tablet daily. Recheck INR in 6 weeks. Anticoagulation Clinic [phone] Main [phone]     

## 2020-09-02 ENCOUNTER — Ambulatory Visit (INDEPENDENT_AMBULATORY_CARE_PROVIDER_SITE_OTHER): Payer: Medicare Other | Admitting: Pharmacist

## 2020-09-02 ENCOUNTER — Other Ambulatory Visit: Payer: Self-pay

## 2020-09-02 DIAGNOSIS — I4891 Unspecified atrial fibrillation: Secondary | ICD-10-CM | POA: Diagnosis not present

## 2020-09-02 DIAGNOSIS — Z5181 Encounter for therapeutic drug level monitoring: Secondary | ICD-10-CM

## 2020-09-02 LAB — POCT INR: INR: 2.4 (ref 2.0–3.0)

## 2020-09-02 NOTE — Patient Instructions (Signed)
Description   Continue taking 1 tablet daily. Recheck INR in 6 weeks. Anticoagulation Clinic [phone] Main [phone]     

## 2020-10-16 ENCOUNTER — Encounter: Payer: Self-pay | Admitting: Cardiovascular Disease

## 2020-10-16 NOTE — Progress Notes (Signed)
Cardiology Office Note:    Date:  10/17/2020   ID:  Hannah Downs, DOB 07-Apr-1946, MRN [id number]  PCP:  [name], MD  [facility] Cardiologist:  Archibald Marchetta  [facility] Electrophysiologist:  None   Referring MD: [name], MD   Chief Complaint  Patient presents with  . Atrial Fibrillation    Sept. 7, 2021:   Hannah Downs is a 75 y.o. female with a hx of HTN, HLD, PAF  She had been complaining of palpitations and an event monitor showed atrial fib .  She was originally prescribed Eliquis but is now on Coumadin due to cost   She was originally seen in [place] but would like to switch to see me since they live here and  I see her husband .  Has been having palpitations of the past year.  Has had some dizziness,   Fell on the bathroom floor.  Her HR seemed to be normal after she passed. Out   Was seen in the [place] office.  Orthostatic BP were checked, Zio monitor showed Afib   Has paroxysmal AFib Can last for 30 minutes.  Labs from Dr. Cliffton Asters were reviewed Potassium is 4.4.   Hb is 14. 5   Echo in July show normal LV function. Grade 2 diastolic dysfunction  She is feeling better now that she is on metoprolol   Feb. 4, 2022:  Hannah Downs is seen today for follow up of her PAF She has grade 2 diastolic dysfunction She is feeling well    Past Medical History:  Diagnosis Date  . Arthritis   . GERD (gastroesophageal reflux disease)   . Hypercholesterolemia   . Hypertension   . Lumbar degenerative disc disease   . Miscarriage    times 3  . Osteopenia   . Seasonal allergies   . Varicose vein    right leg  . Vitamin D deficiency     Past Surgical History:  Procedure Laterality Date  . DILATION AND CURETTAGE OF UTERUS     hx of approx 26 years ago  . HERNIA REPAIR     hx of in left groin times 2  . 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 atorvastatin (LIPITOR) 20 MG tablet Take 1 tablet (20 mg total) by mouth daily.  . Famotidine (PEPCID AC PO) Take 1 tablet by mouth at bedtime as needed.  . hydrochlorothiazide (HYDRODIURIL) 25 MG tablet Take 1 tablet (25 mg total) by mouth daily.  . metoprolol succinate (TOPROL-XL) 100 MG 24 hr tablet Take 1.5 tablets (150 mg total) by mouth daily. Take with or immediately following a meal.  . warfarin (COUMADIN) 5 MG tablet Take 1 tablet by mouth daily as directed by the coumadin clinic.  . [DISCONTINUED] metoprolol succinate (TOPROL-XL) 100 MG 24 hr tablet Take 1 tablet (100 mg total) by mouth daily. Take with or immediately following a meal.     Allergies:   Patient has no known allergies.   Social History   Socioeconomic History  . Marital status: Married    Spouse name: Not on file  . Number of children: Not on file  . Years of education: Not on file  . Highest education level: Not on file  Occupational History  . Not on file  Tobacco Use  . Smoking status: Never Smoker  . Smokeless tobacco: Never Used  Substance and Sexual Activity  . Alcohol use: No  . Drug use: No  . Sexual activity: Yes    Birth control/protection: Patch  Other Topics Concern  . Not on file  Social History Narrative  . Not on file   Social Determinants of Health   Financial Resource Strain: Not on file  Food Insecurity: Not on file  Transportation Needs: Not on file  Physical Activity: Not on file  Stress: Not on file  Social Connections: Not on file     Family History: The patient's family history includes Atrial fibrillation in her father; Cancer in her mother; Stroke in her father.  ROS:   Please see the history of present illness.     All other systems reviewed and are negative.  EKGs/Labs/Other Studies Reviewed:       EKG:     Recent Labs: No results found for requested labs within last 8760 hours.  Recent Lipid Panel No results found for: CHOL, TRIG, HDL, CHOLHDL, VLDL, LDLCALC, LDLDIRECT  Physical Exam:    Physical Exam: Blood pressure 134/82, pulse 83, height 5' (1.524 m), weight 124 lb 12.8 oz (56.6 kg), SpO2 98 %.  GEN:  Well nourished, well developed in no acute distress HEENT: Normal NECK: No JVD; No carotid bruits LYMPHATICS: No lymphadenopathy CARDIAC: RRR  With episodes of nonsustained atrial tach vs. Atrial fib . , no murmurs, rubs, gallops RESPIRATORY:  Clear to auscultation without rales, wheezing or rhonchi  ABDOMEN: Soft, non-tender, non-distended MUSCULOSKELETAL:  No edema; No deformity  SKIN: Warm and dry NEUROLOGIC:  Alert and oriented x 3   ASSESSMENT:    1. Atrial fibrillation, unspecified type (HCC)    PLAN:       1. Paroxysmal atrial fib: Lamb continues to have episodes of palpitations that are likely paroxysmal atrial fibrillation.  She is on Toprol 100 mg a day.  We will try increasing her Toprol to 150 mg a day.  I will have her see an APP in 3 months for follow-up visit.  I will plan on seeing her again in 1 year.   .     Medication Adjustments/Labs and Tests Ordered: Current medicines are reviewed at length with the patient today.  Concerns regarding medicines are outlined above.  Orders Placed This Encounter  Procedures  . EKG 12-Lead   Meds ordered this encounter  Medications  . metoprolol succinate (TOPROL-XL) 100 MG 24 hr tablet    Sig: Take 1.5 tablets (150 mg total) by mouth daily. Take with or immediately following a meal.    Dispense:  135 tablet    Refill:  3     Patient Instructions  Medication Instructions:  Your physician has recommended you make the following change in your medication:   Increase Toprol to 150mg  daily  *If you need a refill on your cardiac medications before your next appointment, please call your pharmacy*   Lab  Work: none If you have labs (blood work) drawn today and your tests are completely normal, you will receive your results only by: Marland Kitchen MyChart Message (if you have MyChart) OR . A paper copy in the mail If you have any lab test that is abnormal or we need to change your treatment, we will call you to review the results.   Testing/Procedures: none   Follow-Up: At [facility], you and your health needs are our priority.  As part of  our continuing mission to provide you with exceptional heart care, we have created designated Provider Care Teams.  These Care Teams include your primary Cardiologist (physician) and Advanced Practice Providers (APPs -  Physician Assistants and Nurse Practitioners) who all work together to provide you with the care you need, when you need it.  We recommend signing up for the patient portal called "MyChart".  Sign up information is provided on this After Visit Summary.  MyChart is used to connect with patients for Virtual Visits (Telemedicine).  Patients are able to view lab/test results, encounter notes, upcoming appointments, etc.  Non-urgent messages can be sent to your provider as well.   To learn more about what you can do with MyChart, go to [email or web address].    Your next appointment:   3 month(s)  The format for your next appointment:   In Person  Provider:   You will see one of the following Advanced Practice Providers on your designated Care Team:    Tereso Newcomer, PA-C  Chelsea Aus, [place]      Signed, Kristeen Miss, MD  10/17/2020 3:16 PM     [facility]

## 2020-10-17 ENCOUNTER — Ambulatory Visit (INDEPENDENT_AMBULATORY_CARE_PROVIDER_SITE_OTHER): Payer: Medicare Other | Admitting: Cardiovascular Disease

## 2020-10-17 ENCOUNTER — Ambulatory Visit (INDEPENDENT_AMBULATORY_CARE_PROVIDER_SITE_OTHER): Payer: Medicare Other | Admitting: *Deleted

## 2020-10-17 ENCOUNTER — Encounter: Payer: Self-pay | Admitting: Cardiovascular Disease

## 2020-10-17 ENCOUNTER — Other Ambulatory Visit: Payer: Self-pay

## 2020-10-17 VITALS — BP 134/82 | HR 83 | Ht 60.0 in | Wt 124.8 lb

## 2020-10-17 DIAGNOSIS — Z5181 Encounter for therapeutic drug level monitoring: Secondary | ICD-10-CM | POA: Diagnosis not present

## 2020-10-17 DIAGNOSIS — I4891 Unspecified atrial fibrillation: Secondary | ICD-10-CM

## 2020-10-17 LAB — POCT INR: INR: 2.5 (ref 2.0–3.0)

## 2020-10-17 MED ORDER — METOPROLOL SUCCINATE ER 100 MG PO TB24: 150.0000 mg | ORAL_TABLET | Freq: Every day | ORAL | 3 refills | Status: DC

## 2020-10-17 NOTE — Patient Instructions (Signed)
Description   Continue taking 1 tablet daily. Recheck INR in 8 weeks. Anticoagulation Clinic [phone] Main [phone]

## 2020-10-17 NOTE — Patient Instructions (Signed)
Medication Instructions:  Your physician has recommended you make the following change in your medication:   Increase Toprol to 150mg  daily  *If you need a refill on your cardiac medications before your next appointment, please call your pharmacy*   Lab Work: none If you have labs (blood work) drawn today and your tests are completely normal, you will receive your results only by: MyChart Message (if you have MyChart) OR . A paper copy in the mail If you have any lab test that is abnormal or we need to change your treatment, we will call you to review the results.   Testing/Procedures: none   Follow-Up: At [facility], you and your health needs are our priority.  As part of our continuing mission to provide you with exceptional heart care, we have created designated Provider Care Teams.  These Care Teams include your primary Cardiologist (physician) and Advanced Practice Providers (APPs -  Physician Assistants and Nurse Practitioners) who all work together to provide you with the care you need, when you need it.  We recommend signing up for the patient portal called "MyChart".  Sign up information is provided on this After Visit Summary.  MyChart is used to connect with patients for Virtual Visits (Telemedicine).  Patients are able to view lab/test results, encounter notes, upcoming appointments, etc.  Non-urgent messages can be sent to your provider as well.   To learn more about what you can do with MyChart, go to [facility].    Your next appointment:   3 month(s)  The format for your next appointment:   In Person  Provider:   You will see one of the following Advanced Practice Providers on your designated Care Team:    [email or web address], PA-C  [place], [place]

## 2020-11-25 DIAGNOSIS — K4090 Unilateral inguinal hernia, without obstruction or gangrene, not specified as recurrent: Secondary | ICD-10-CM | POA: Diagnosis not present

## 2020-11-25 DIAGNOSIS — I480 Paroxysmal atrial fibrillation: Secondary | ICD-10-CM | POA: Diagnosis not present

## 2020-11-25 DIAGNOSIS — I10 Essential (primary) hypertension: Secondary | ICD-10-CM | POA: Diagnosis not present

## 2020-11-25 DIAGNOSIS — E785 Hyperlipidemia, unspecified: Secondary | ICD-10-CM | POA: Diagnosis not present

## 2020-11-25 DIAGNOSIS — D6869 Other thrombophilia: Secondary | ICD-10-CM | POA: Diagnosis not present

## 2020-11-29 ENCOUNTER — Other Ambulatory Visit: Payer: Self-pay | Admitting: Cardiovascular Disease

## 2020-12-19 ENCOUNTER — Other Ambulatory Visit: Payer: Self-pay

## 2020-12-19 ENCOUNTER — Ambulatory Visit (INDEPENDENT_AMBULATORY_CARE_PROVIDER_SITE_OTHER): Payer: Medicare Other | Admitting: Pharmacist

## 2020-12-19 DIAGNOSIS — Z5181 Encounter for therapeutic drug level monitoring: Secondary | ICD-10-CM | POA: Diagnosis not present

## 2020-12-19 DIAGNOSIS — I4891 Unspecified atrial fibrillation: Secondary | ICD-10-CM | POA: Diagnosis not present

## 2020-12-19 LAB — POCT INR: INR: 2.3 (ref 2.0–3.0)

## 2020-12-19 NOTE — Patient Instructions (Signed)
Description   Continue taking 1 tablet daily. Recheck INR in 6 weeks. Anticoagulation Clinic [phone] Main [phone]

## 2021-01-14 ENCOUNTER — Ambulatory Visit (INDEPENDENT_AMBULATORY_CARE_PROVIDER_SITE_OTHER): Payer: Medicare Other | Admitting: Physician Assistant

## 2021-01-14 ENCOUNTER — Encounter: Payer: Self-pay | Admitting: Physician Assistant

## 2021-01-14 ENCOUNTER — Other Ambulatory Visit: Payer: Self-pay

## 2021-01-14 VITALS — BP 150/60 | HR 75 | Ht 60.0 in | Wt 123.8 lb

## 2021-01-14 DIAGNOSIS — I10 Essential (primary) hypertension: Secondary | ICD-10-CM | POA: Diagnosis not present

## 2021-01-14 DIAGNOSIS — I480 Paroxysmal atrial fibrillation: Secondary | ICD-10-CM | POA: Diagnosis not present

## 2021-01-14 NOTE — Progress Notes (Signed)
Cardiology Office Note:    Date:  01/14/2021   ID:  Hannah Downs, DOB Jan 13, 1946, MRN [id number]  PCP:  [name], MD   [facility] Providers Cardiologist:  Kristeen Miss, MD     Referring MD: [name], MD   Chief Complaint:  Follow-up (AFib)    Patient Profile:    Hannah Downs is a 75 y.o. female with:   Paroxysmal atrial fibrillation  Echo 7/21: EF 55-60, Gr 2 DD, RVSP 38.3, mild BAE  Hypertension  Hyperlipidemia  GERD  Prior CV studies: Echocardiogram 04/01/2020 EF 55-60, no RWMA, mild LVH, GRII DD, normal RVSF, mildly elevated PASP, RVSP 38.3, mild BAE, trivial MR, mild-moderate TR  LONG TERM MONITOR (8-14 DAYS) HOOK UP AND INTERPRETATION 03/26/2020 Narrative Cardiac monitor showed paroxysmal atrial fibrillation ( less than 1% burden) with HR 92-142bpm. Atrial fibrillation was associated with patient triggered events.    History of Present Illness: Hannah Downs was last seen by Dr. Elease Hashimoto in 2/22.  She had episodes of palpitations and her metoprolol succinate dose was increased to 150 mg daily.  She returns for follow-up.  She is here today with her husband.  Since increasing the dose of her metoprolol succinate, she has felt well.  She has had very few palpitations.  She has not had significant shortness of breath, chest pain, orthopnea, leg edema, syncope.      Past Medical History:  Diagnosis Date  . Arthritis   . GERD (gastroesophageal reflux disease)   . Hypercholesterolemia   . Hypertension   . Lumbar degenerative disc disease   . Miscarriage    times 3  . Osteopenia   . Seasonal allergies   . Varicose vein    right leg  . Vitamin D deficiency     Current Medications: Current Meds  Medication Sig  . atorvastatin (LIPITOR) 20 MG tablet Take 1 tablet (20 mg total) by mouth daily.  . Famotidine (PEPCID AC PO) Take 1 tablet by mouth at bedtime as needed (for heartburn).  . hydrochlorothiazide (HYDRODIURIL) 25 MG tablet  Take 1 tablet (25 mg total) by mouth daily.  . metoprolol succinate (TOPROL-XL) 100 MG 24 hr tablet Take 1.5 tablets (150 mg total) by mouth daily. Take with or immediately following a meal.  . warfarin (COUMADIN) 5 MG tablet TAKE 1 TABLET BY MOUTH ONCE DAILY AS DIRECTED BY THE COUMADIN CLINIC     Allergies:   Lisinopril   Social History   Tobacco Use  . Smoking status: Never Smoker  . Smokeless tobacco: Never Used  Substance Use Topics  . Alcohol use: No  . Drug use: No     Family Hx: The patient's family history includes Atrial fibrillation in her father; Cancer in her mother; Stroke in her father.  Review of Systems  Gastrointestinal: Negative for hematochezia and melena.  Genitourinary: Negative for hematuria.     EKGs/Labs/Other Test Reviewed:    EKG:  EKG is notn ordered today.  The ekg ordered today demonstrates /a  Recent Labs: No results found for requested labs within last 8760 hours.   Recent Lipid Panel No results found for: CHOL, TRIG, HDL, CHOLHDL, LDLCALC, LDLDIRECT    Risk Assessment/Calculations:    CHA2DS2-VASc Score = 4  This indicates a 4.8% annual risk of stroke. The patient's score is based upon: CHF History: No HTN History: Yes Diabetes History: No Stroke History: No Vascular Disease History: No Age Score: 2 Gender Score: 1     Physical Exam:  VS:  BP (!) 150/60   Pulse 75   Ht 5' (1.524 m)   Wt 123 lb 12.8 oz (56.2 kg)   SpO2 94%   BMI 24.18 kg/m     Wt Readings from Last 3 Encounters:  01/14/21 123 lb 12.8 oz (56.2 kg)  10/17/20 124 lb 12.8 oz (56.6 kg)  05/20/20 123 lb 3.2 oz (55.9 kg)     Constitutional:      Appearance: Healthy appearance. Not in distress.  Neck:     Vascular: JVD normal.  Pulmonary:     Effort: Pulmonary effort is normal.     Breath sounds: No wheezing. No rales.  Cardiovascular:     Normal rate. Regular rhythm. Normal S1. Normal S2.     Murmurs: There is no murmur.  Edema:    Peripheral  edema absent.  Abdominal:     Palpations: Abdomen is soft.  Skin:    General: Skin is warm and dry.  Neurological:     Mental Status: Alert and oriented to person, place and time.     Cranial Nerves: Cranial nerves are intact.         ASSESSMENT & PLAN:    1. Paroxysmal atrial fibrillation (HCC) Maintaining NSR by exam.  She has had much less palpitations since increasing her metoprolol succinate.  Her palpitations are fairly short lived when they do occur.  Continue current dose of metoprolol succinate. She is tolerating anticoagulation.  Continue f/u in the Coumadin Clinic.   2. Essential hypertension BPs at home are optimal on her current regimen.  No changes are needed.     Dispo:  Return in about 9 months (around 10/17/2021) for Routine Follow Up, w/ Dr. Elease Hashimoto, in person.   Medication Adjustments/Labs and Tests Ordered: Current medicines are reviewed at length with the patient today.  Concerns regarding medicines are outlined above.  Tests Ordered: No orders of the defined types were placed in this encounter.  Medication Changes: No orders of the defined types were placed in this encounter.   Signed, Tereso Newcomer, PA-C  01/14/2021 2:12 PM    [facility] [address] [place], [place], [place]  [address] Phone: [phone]; Fax: [phone]

## 2021-01-14 NOTE — Patient Instructions (Signed)
Medication Instructions:  Your physician recommends that you continue on your current medications as directed. Please refer to the Current Medication list given to you today.   *If you need a refill on your cardiac medications before your next appointment, please call your pharmacy*   Lab Work: -None  If you have labs (blood work) drawn today and your tests are completely normal, you will receive your results only by: Marland Kitchen MyChart Message (if you have MyChart) OR . A paper copy in the mail If you have any lab test that is abnormal or we need to change your treatment, we will call you to review the results.   Testing/Procedures: -None    Follow-Up: At [facility], you and your health needs are our priority.  As part of our continuing mission to provide you with exceptional heart care, we have created designated Provider Care Teams.  These Care Teams include your primary Cardiologist (physician) and Advanced Practice Providers (APPs -  Physician Assistants and Nurse Practitioners) who all work together to provide you with the care you need, when you need it.  We recommend signing up for the patient portal called "MyChart".  Sign up information is provided on this After Visit Summary.  MyChart is used to connect with patients for Virtual Visits (Telemedicine).  Patients are able to view lab/test results, encounter notes, upcoming appointments, etc.  Non-urgent messages can be sent to your provider as well.   To learn more about what you can do with MyChart, go to [email or web address].    Your next appointment:   9 month(s)  The format for your next appointment:   In Person  Provider:   Kristeen Miss, MD   Other Instructions Your physician wants you to follow-up in: 9 month with Dr. Elease Hashimoto.  You will receive a reminder letter in the mail two months in advance. If you don't receive a letter, please call our office to schedule the follow-up appointment.

## 2021-02-13 ENCOUNTER — Ambulatory Visit (INDEPENDENT_AMBULATORY_CARE_PROVIDER_SITE_OTHER): Payer: Medicare Other | Admitting: Pharmacist

## 2021-02-13 ENCOUNTER — Other Ambulatory Visit: Payer: Self-pay

## 2021-02-13 DIAGNOSIS — Z5181 Encounter for therapeutic drug level monitoring: Secondary | ICD-10-CM | POA: Diagnosis not present

## 2021-02-13 DIAGNOSIS — I4891 Unspecified atrial fibrillation: Secondary | ICD-10-CM

## 2021-02-13 LAB — POCT INR: INR: 2.4 (ref 2.0–3.0)

## 2021-02-13 NOTE — Patient Instructions (Signed)
Continue taking 1 tablet daily. Recheck INR in 6 weeks. Anticoagulation Clinic [phone] Main [phone]

## 2021-02-27 DIAGNOSIS — K4091 Unilateral inguinal hernia, without obstruction or gangrene, recurrent: Secondary | ICD-10-CM | POA: Diagnosis not present

## 2021-03-05 ENCOUNTER — Other Ambulatory Visit: Payer: Self-pay | Admitting: Surgery

## 2021-03-05 DIAGNOSIS — K4091 Unilateral inguinal hernia, without obstruction or gangrene, recurrent: Secondary | ICD-10-CM

## 2021-03-26 ENCOUNTER — Other Ambulatory Visit: Payer: Self-pay | Admitting: Surgery

## 2021-03-26 ENCOUNTER — Ambulatory Visit
Admission: RE | Admit: 2021-03-26 | Discharge: 2021-03-26 | Disposition: A | Discharge status: Home / Self Care | Payer: Medicare Other | Source: Ambulatory Visit | Attending: Surgery | Admitting: Surgery

## 2021-03-26 ENCOUNTER — Other Ambulatory Visit: Payer: Self-pay

## 2021-03-26 DIAGNOSIS — K4091 Unilateral inguinal hernia, without obstruction or gangrene, recurrent: Secondary | ICD-10-CM

## 2021-03-26 DIAGNOSIS — K8020 Calculus of gallbladder without cholecystitis without obstruction: Secondary | ICD-10-CM | POA: Diagnosis not present

## 2021-03-26 DIAGNOSIS — R109 Unspecified abdominal pain: Secondary | ICD-10-CM | POA: Diagnosis not present

## 2021-03-26 DIAGNOSIS — R1032 Left lower quadrant pain: Secondary | ICD-10-CM | POA: Diagnosis not present

## 2021-03-26 DIAGNOSIS — K5730 Diverticulosis of large intestine without perforation or abscess without bleeding: Secondary | ICD-10-CM | POA: Diagnosis not present

## 2021-03-26 MED ORDER — IOPAMIDOL (ISOVUE-300) INJECTION 61%: 100.0000 mL | Freq: Once | INTRAVENOUS | Status: DC | PRN

## 2021-03-26 NOTE — Progress Notes (Signed)
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 The IV was already removed by CT staff, [place]. Site was reddened, swollen, and pt c/o site feeling "tight". Pt had no tingling, numbness, or tightness in hands and fingers below site. Dr. Karin Golden saw pt and assessed site. He ordered for pts arm to be elevated and ice applied for 30 minutes in the nursing area, then he would reassess.  After 30 minutes, site was assessed by Dr. Karin Golden and myself. Site was still red and swollen but appeared to look better. Dr. Karin Golden recommended the patient be d/ced home, her to apply ice to the site throughout the night and elevate the arm above her heart. Dr. Karin Golden also went into detail of when the patient should seek emergency care, pt and her husband verbalized understanding. Pt was sent home with two ice packs and has our direct contact information if needed. Our CT staff will call the patient tomorrow to check on her.

## 2021-04-16 ENCOUNTER — Other Ambulatory Visit: Payer: Self-pay

## 2021-04-16 ENCOUNTER — Ambulatory Visit (INDEPENDENT_AMBULATORY_CARE_PROVIDER_SITE_OTHER): Payer: Medicare Other

## 2021-04-16 DIAGNOSIS — I4891 Unspecified atrial fibrillation: Secondary | ICD-10-CM

## 2021-04-16 DIAGNOSIS — Z5181 Encounter for therapeutic drug level monitoring: Secondary | ICD-10-CM

## 2021-04-16 LAB — POCT INR: INR: 2.5 (ref 2.0–3.0)

## 2021-04-16 NOTE — Patient Instructions (Signed)
Description   Continue taking 1 tablet daily. Recheck INR in 6 weeks. Anticoagulation Clinic [phone] Main [phone]

## 2021-04-20 ENCOUNTER — Other Ambulatory Visit: Payer: Self-pay | Admitting: Cardiovascular Disease

## 2021-06-15 ENCOUNTER — Other Ambulatory Visit: Payer: Self-pay | Admitting: Cardiovascular Disease

## 2021-06-16 ENCOUNTER — Other Ambulatory Visit: Payer: Self-pay

## 2021-06-16 ENCOUNTER — Ambulatory Visit (INDEPENDENT_AMBULATORY_CARE_PROVIDER_SITE_OTHER): Payer: Medicare Other

## 2021-06-16 DIAGNOSIS — Z5181 Encounter for therapeutic drug level monitoring: Secondary | ICD-10-CM

## 2021-06-16 DIAGNOSIS — I4891 Unspecified atrial fibrillation: Secondary | ICD-10-CM

## 2021-06-16 LAB — POCT INR: INR: 2.1 (ref 2.0–3.0)

## 2021-06-16 NOTE — Patient Instructions (Signed)
Description   Continue taking 1 tablet daily. Recheck INR in 6 weeks. Anticoagulation Clinic [phone] Main [phone]     

## 2021-07-24 ENCOUNTER — Telehealth: Payer: Self-pay | Admitting: *Deleted

## 2021-07-24 DIAGNOSIS — N8110 Cystocele, unspecified: Secondary | ICD-10-CM | POA: Diagnosis not present

## 2021-07-24 DIAGNOSIS — I480 Paroxysmal atrial fibrillation: Secondary | ICD-10-CM | POA: Diagnosis not present

## 2021-07-24 DIAGNOSIS — K4091 Unilateral inguinal hernia, without obstruction or gangrene, recurrent: Secondary | ICD-10-CM | POA: Diagnosis not present

## 2021-07-24 DIAGNOSIS — N814 Uterovaginal prolapse, unspecified: Secondary | ICD-10-CM | POA: Diagnosis not present

## 2021-07-24 DIAGNOSIS — Z79899 Other long term (current) drug therapy: Secondary | ICD-10-CM

## 2021-07-24 DIAGNOSIS — K623 Rectal prolapse: Secondary | ICD-10-CM | POA: Diagnosis not present

## 2021-07-24 DIAGNOSIS — I700 Atherosclerosis of aorta: Secondary | ICD-10-CM

## 2021-07-24 NOTE — Telephone Encounter (Signed)
Pre-operative Risk Assessment    Patient Name: Hannah Downs  DOB: June 06, 1946 MRN: [id number]      Request for Surgical Clearance   Procedure:   HERNIA SURGERY  Date of Surgery: Clearance TBD                                 Surgeon:  DR. MATTHEW MARTIN Surgeon's Group or Practice Name:  [facility] Phone number:  [phone] Fax number:  [phone] ATTN: Doristine Devoid, CMA   Type of Clearance Requested: - Medical  - Pharmacy:  Hold Warfarin (Coumadin)     Type of Anesthesia:   General    Additional requests/questions:   [name]   07/24/2021, 4:30 PM

## 2021-07-28 NOTE — Telephone Encounter (Signed)
Patient has no labs on record since 02/2020 [facility]).  Will need BMET and CBC for determination of warfarin hold

## 2021-07-28 NOTE — Telephone Encounter (Signed)
Please arrange CBC, BMET Tereso Newcomer, PA-C    07/28/2021 5:37 PM

## 2021-07-29 NOTE — Telephone Encounter (Signed)
Pt will come in Monday, 08/03/21 for BMET & CBC.  Will route over to the requesting surgeon's office to make them aware that we will process clearance once labs are back.  Pt verbalized understanding.

## 2021-08-03 ENCOUNTER — Other Ambulatory Visit: Payer: Medicare Other | Admitting: *Deleted

## 2021-08-03 ENCOUNTER — Other Ambulatory Visit: Payer: Self-pay

## 2021-08-03 DIAGNOSIS — Z79899 Other long term (current) drug therapy: Secondary | ICD-10-CM | POA: Diagnosis not present

## 2021-08-04 DIAGNOSIS — I10 Essential (primary) hypertension: Secondary | ICD-10-CM | POA: Insufficient documentation

## 2021-08-04 DIAGNOSIS — I700 Atherosclerosis of aorta: Secondary | ICD-10-CM | POA: Insufficient documentation

## 2021-08-04 DIAGNOSIS — K219 Gastro-esophageal reflux disease without esophagitis: Secondary | ICD-10-CM | POA: Insufficient documentation

## 2021-08-04 DIAGNOSIS — E785 Hyperlipidemia, unspecified: Secondary | ICD-10-CM | POA: Insufficient documentation

## 2021-08-04 DIAGNOSIS — E559 Vitamin D deficiency, unspecified: Secondary | ICD-10-CM | POA: Insufficient documentation

## 2021-08-04 LAB — CBC
Hematocrit: 39.4 % (ref 34.0–46.6)
Hemoglobin: 13.8 g/dL (ref 11.1–15.9)
MCH: 30.8 pg (ref 26.6–33.0)
MCHC: 35 g/dL (ref 31.5–35.7)
MCV: 88 fL (ref 79–97)
Platelets: 301 10*3/uL (ref 150–450)
RBC: 4.48 x10E6/uL (ref 3.77–5.28)
RDW: 12.5 % (ref 11.7–15.4)
WBC: 7.5 10*3/uL (ref 3.4–10.8)

## 2021-08-04 LAB — BASIC METABOLIC PANEL
BUN/Creatinine Ratio: 22 (ref 12–28)
BUN: 14 mg/dL (ref 8–27)
CO2: 27 mmol/L (ref 20–29)
Calcium: 9.9 mg/dL (ref 8.7–10.3)
Chloride: 96 mmol/L (ref 96–106)
Creatinine, Ser: 0.64 mg/dL (ref 0.57–1.00)
Glucose: 100 mg/dL — ABNORMAL HIGH (ref 70–99)
Potassium: 3.6 mmol/L (ref 3.5–5.2)
Sodium: 137 mmol/L (ref 134–144)
eGFR: 92 mL/min/{1.73_m2} (ref >59)

## 2021-08-04 NOTE — Telephone Encounter (Signed)
Patient Name: Hannah Downs  DOB: 01/09/1946 MRN: [id number]  Primary Cardiologist: Kristeen Miss, MD  Chart reviewed as part of pre-operative protocol coverage. Patient was last seen by Tereso Newcomer, PA-C, in 01/2021 at which time she was doing well from a cardiac standpoint. Patient was contacted today for further pre-op evaluation and reported doing well since last visit. She notes occasional brief palpitations but nothing significant. No chest pain, shortness of breath, acute CHF symptoms, or recent syncope. She is able to complete >4.0 METS without any problems. Given past medical history and time since last visit, based on ACC/AHA guidelines, Hannah Downs would be at acceptable risk for the planned procedure without further cardiovascular testing.   Per Pharmacy and office protocol, "patient can hold Warfarin for 5 days prior to procedure. Patient will not need bridging with Lovenox (enoxaparin) around procedure." Please restart this as soon as possible postoperatively.   I will route this recommendation to the requesting party via Epic fax function and remove from pre-op pool.  Please call with questions.  Corrin Parker, PA-C 08/04/2021, 10:23 AM

## 2021-08-04 NOTE — Telephone Encounter (Signed)
Clearance notes faxed to requesting office

## 2021-08-04 NOTE — Telephone Encounter (Signed)
Pharmacy, looks like she came for labs yesterday. Can you please comment on how long Coumadin can be held for upcoming hernia surgery?  Thank you!

## 2021-08-04 NOTE — Telephone Encounter (Signed)
Patient with diagnosis of afib on warfarin for anticoagulation.    Procedure: hernia surgery Date of procedure: TBD  CHA2DS2-VASc Score = 5  This indicates a 7.2% annual risk of stroke. The patient's score is based upon: CHF History: 0 HTN History: 1 Diabetes History: 0 Stroke History: 0 Vascular Disease History: 1 Age Score: 2 Gender Score: 1  CrCl 77mL/min Platelet count 301K  Per office protocol, patient can hold warfarin for 5 days prior to procedure. Patient will not need bridging with Lovenox (enoxaparin) around procedure.

## 2021-08-24 ENCOUNTER — Other Ambulatory Visit: Payer: Self-pay

## 2021-08-24 ENCOUNTER — Ambulatory Visit (INDEPENDENT_AMBULATORY_CARE_PROVIDER_SITE_OTHER): Payer: Medicare Other | Admitting: *Deleted

## 2021-08-24 DIAGNOSIS — Z5181 Encounter for therapeutic drug level monitoring: Secondary | ICD-10-CM

## 2021-08-24 DIAGNOSIS — I4891 Unspecified atrial fibrillation: Secondary | ICD-10-CM | POA: Diagnosis not present

## 2021-08-24 LAB — POCT INR: INR: 2.1 (ref 2.0–3.0)

## 2021-08-24 NOTE — Patient Instructions (Addendum)
Description   Continue taking 1 tablet daily. Follow instructions for upcoming procedure. Recheck INR in 1 week post surgery. Anticoagulation Clinic [phone] Main [phone]    Take your last dose of Warfarin on 09/12/2021 then start holding Warfarin for upcoming procedure. When you resume your Warfarin take an extra 1/2 tablet for two days then resume normal dose. Report to Anticoagulation Appt on 09/25/20

## 2021-08-26 NOTE — Progress Notes (Signed)
Sent message, via epic in basket, requesting orders in epic from surgeon.  

## 2021-08-31 ENCOUNTER — Ambulatory Visit: Payer: Self-pay | Admitting: Surgery

## 2021-08-31 NOTE — Progress Notes (Addendum)
Anesthesia Review:  PCP: DR Aram Beecham white  Cardiologist :  DR Kristeen Miss Telephone encounter 07/24/2021  LOV - Tereso Newcomer, [facility] 01/14/21.  Monitor- 03/26/21  Chest x-ray : EKG :10/17/20.  Echo :04/02/20  Stress test: Cardiac Cath :  Activity level: can do a flgiht of stairs without difficulty  Sleep Study/ CPAP : none  Fasting Blood Sugar :      / Checks Blood Sugar -- times a day:   Blood Thinner/ Instructions /Last Dose: ASA / Instructions/ Last Dose :  Coumadin  - last dose on 09/12/21 per pt  BMP done 09/01/21 routed to DR Daphine Deutscher.  No covid test- ambulatory surgery

## 2021-08-31 NOTE — Progress Notes (Signed)
Your procedure is scheduled on:  09/18/2020   Report to [facility] Main  Entrance   Report to admitting at    [phone]     Call this number if you have problems the morning of surgery [phone]    REMEMBER: NO  SOLID FOOD CANDY OR GUM AFTER MIDNIGHT. CLEAR LIQUIDS UNTIL 0430am         . NOTHING BY MOUTH EXCEPT CLEAR LIQUIDS UNTIL  0430am   . PLEASE FINISH ENSURE DRINK PER SURGEON ORDER  WHICH NEEDS TO BE COMPLETED AT  0430am     .      CLEAR LIQUID DIET   Foods Allowed                                                                    Coffee and tea, regular and decaf                            Fruit ices (not with fruit pulp)                                      Iced Popsicles                                    Carbonated beverages, regular and diet                                    Cranberry, grape and apple juices Sports drinks like Gatorade Lightly seasoned clear broth or consume(fat free) Sugar, honey syrup ___________________________________________________________________      BRUSH YOUR TEETH MORNING OF SURGERY AND RINSE YOUR MOUTH OUT, NO CHEWING GUM CANDY OR MINTS.     Take these medicines the morning of surgery with A SIP OF WATER:  none   DO NOT TAKE ANY DIABETIC MEDICATIONS DAY OF YOUR SURGERY                               You may not have any metal on your body including hair pins and              piercings  Do not wear jewelry, make-up, lotions, powders or perfumes, deodorant             Do not wear nail polish on your fingernails.  Do not shave  48 hours prior to surgery.              Men may shave face and neck.   Do not bring valuables to the hospital. [place] IS NOT             RESPONSIBLE   FOR VALUABLES.  Contacts, dentures or bridgework may not be worn into surgery.  Leave suitcase in the car. After surgery it may be brought to your room.     Patients discharged the day of surgery will not be allowed to drive home. IF YOU  ARE  HAVING SURGERY AND GOING HOME THE SAME DAY, YOU MUST HAVE AN ADULT TO DRIVE YOU HOME AND BE WITH YOU FOR 24 HOURS. YOU MAY GO HOME BY TAXI OR UBER OR ORTHERWISE, BUT AN ADULT MUST ACCOMPANY YOU HOME AND STAY WITH YOU FOR 24 HOURS.  Name and phone number of your driver:  Special Instructions: N/A              Please read over the following fact sheets you were given: _____________________________________________________________________  [facility] - Preparing for Surgery Before surgery, you can play an important role.  Because skin is not sterile, your skin needs to be as free of germs as possible.  You can reduce the number of germs on your skin by washing with CHG (chlorahexidine gluconate) soap before surgery.  CHG is an antiseptic cleaner which kills germs and bonds with the skin to continue killing germs even after washing. Please DO NOT use if you have an allergy to CHG or antibacterial soaps.  If your skin becomes reddened/irritated stop using the CHG and inform your nurse when you arrive at Short Stay. Do not shave (including legs and underarms) for at least 48 hours prior to the first CHG shower.  You may shave your face/neck. Please follow these instructions carefully:  1.  Shower with CHG Soap the night before surgery and the  morning of Surgery.  2.  If you choose to wash your hair, wash your hair first as usual with your  normal  shampoo.  3.  After you shampoo, rinse your hair and body thoroughly to remove the  shampoo.                           4.  Use CHG as you would any other liquid soap.  You can apply chg directly  to the skin and wash                       Gently with a scrungie or clean washcloth.  5.  Apply the CHG Soap to your body ONLY FROM THE NECK DOWN.   Do not use on face/ open                           Wound or open sores. Avoid contact with eyes, ears mouth and genitals (private parts).                       Wash face,  Genitals (private parts) with your normal  soap.             6.  Wash thoroughly, paying special attention to the area where your surgery  will be performed.  7.  Thoroughly rinse your body with warm water from the neck down.  8.  DO NOT shower/wash with your normal soap after using and rinsing off  the CHG Soap.                9.  Pat yourself dry with a clean towel.            10.  Wear clean pajamas.            11.  Place clean sheets on your bed the night of your first shower and do not  sleep with pets. Day of Surgery : Do not apply any lotions/deodorants the morning of surgery.  Please wear clean clothes to the hospital/surgery center.  FAILURE TO FOLLOW THESE INSTRUCTIONS MAY RESULT IN THE CANCELLATION OF YOUR SURGERY PATIENT SIGNATURE_________________________________  NURSE SIGNATURE__________________________________  ________________________________________________________________________

## 2021-09-01 ENCOUNTER — Encounter (HOSPITAL_COMMUNITY): Payer: Self-pay

## 2021-09-01 ENCOUNTER — Encounter (HOSPITAL_COMMUNITY)
Admission: RE | Admit: 2021-09-01 | Discharge: 2021-09-01 | Disposition: A | Discharge status: Home / Self Care | Payer: Medicare Other | Source: Ambulatory Visit | Attending: Surgery | Admitting: Surgery

## 2021-09-01 ENCOUNTER — Other Ambulatory Visit: Payer: Self-pay

## 2021-09-01 VITALS — BP 140/97 | HR 61 | Temp 98.2°F | Resp 16 | Ht 61.0 in | Wt 119.0 lb

## 2021-09-01 DIAGNOSIS — K219 Gastro-esophageal reflux disease without esophagitis: Secondary | ICD-10-CM | POA: Insufficient documentation

## 2021-09-01 DIAGNOSIS — Z01818 Encounter for other preprocedural examination: Secondary | ICD-10-CM | POA: Insufficient documentation

## 2021-09-01 DIAGNOSIS — K4091 Unilateral inguinal hernia, without obstruction or gangrene, recurrent: Secondary | ICD-10-CM | POA: Diagnosis not present

## 2021-09-01 DIAGNOSIS — I480 Paroxysmal atrial fibrillation: Secondary | ICD-10-CM

## 2021-09-01 DIAGNOSIS — I4891 Unspecified atrial fibrillation: Secondary | ICD-10-CM | POA: Diagnosis not present

## 2021-09-01 DIAGNOSIS — I10 Essential (primary) hypertension: Secondary | ICD-10-CM | POA: Diagnosis not present

## 2021-09-01 LAB — BASIC METABOLIC PANEL
Anion gap: 10 (ref 5–15)
BUN: 16 mg/dL (ref 8–23)
CO2: 27 mmol/L (ref 22–32)
Calcium: 9.3 mg/dL (ref 8.9–10.3)
Chloride: 98 mmol/L (ref 98–111)
Creatinine, Ser: 0.71 mg/dL (ref 0.44–1.00)
GFR, Estimated: >60 mL/min (ref >60)
Glucose, Bld: 107 mg/dL — ABNORMAL HIGH (ref 70–99)
Potassium: 3.2 mmol/L — ABNORMAL LOW (ref 3.5–5.1)
Sodium: 135 mmol/L (ref 135–145)

## 2021-09-01 LAB — CBC
HCT: 40.7 % (ref 36.0–46.0)
Hemoglobin: 13.7 g/dL (ref 12.0–15.0)
MCH: 30.2 pg (ref 26.0–34.0)
MCHC: 33.7 g/dL (ref 30.0–36.0)
MCV: 89.6 fL (ref 80.0–100.0)
Platelets: 260 10*3/uL (ref 150–400)
RBC: 4.54 MIL/uL (ref 3.87–5.11)
RDW: 12.5 % (ref 11.5–15.5)
WBC: 7.2 10*3/uL (ref 4.0–10.5)
nRBC: 0 % (ref 0.0–0.2)

## 2021-09-04 NOTE — Anesthesia Preprocedure Evaluation (Addendum)
Anesthesia Evaluation  Patient identified by MRN, date of birth, ID band Patient awake    Reviewed: Allergy & Precautions, NPO status , Patient's Chart, lab work & pertinent test results  Airway Mallampati: II  TM Distance: >3 FB Neck ROM: Full    Dental  (+) Dental Advisory Given   Pulmonary neg pulmonary ROS,    breath sounds clear to auscultation       Cardiovascular hypertension, Pt. on medications + dysrhythmias Atrial Fibrillation  Rhythm:Regular Rate:Normal     Neuro/Psych negative neurological ROS     GI/Hepatic Neg liver ROS, GERD  ,  Endo/Other  negative endocrine ROS  Renal/GU negative Renal ROS     Musculoskeletal  (+) Arthritis ,   Abdominal   Peds  Hematology negative hematology ROS (+)   Anesthesia Other Findings   Reproductive/Obstetrics                            Lab Results  Component Value Date   WBC 7.2 09/01/2021   HGB 13.7 09/01/2021   HCT 40.7 09/01/2021   MCV 89.6 09/01/2021   PLT 260 09/01/2021   Lab Results  Component Value Date   CREATININE 0.71 09/01/2021   BUN 16 09/01/2021   NA 135 09/01/2021   K 3.2 (L) 09/01/2021   CL 98 09/01/2021   CO2 27 09/01/2021    Anesthesia Physical Anesthesia Plan  ASA: 2  Anesthesia Plan: General   Post-op Pain Management: Tylenol PO (pre-op) and Celebrex PO (pre-op)   Induction:   PONV Risk Score and Plan: 4 or greater and Dexamethasone, Ondansetron, Treatment may vary due to age or medical condition and Propofol infusion  Airway Management Planned: Oral ETT  Additional Equipment: None  Intra-op Plan:   Post-operative Plan: Extubation in OR  Informed Consent: I have reviewed the patients History and Physical, chart, labs and discussed the procedure including the risks, benefits and alternatives for the proposed anesthesia with the patient or authorized representative who has indicated his/her  understanding and acceptance.     Dental advisory given  Plan Discussed with: CRNA  Anesthesia Plan Comments:        Anesthesia Quick Evaluation

## 2021-09-04 NOTE — Progress Notes (Signed)
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 never smoker with h/o GERD, HTN, a-fib, pelvic floor prolapse, recurrent left inguinal hernia scheduled for above procedure 09/18/2021 with Dr. Luretha Downs.   Per cardiology preoperative evaluation 08/04/21, "Chart reviewed as part of pre-operative protocol coverage. Patient was last seen by Hannah Newcomer, PA-C, in 01/2021 at which time she was doing well from a cardiac standpoint. Patient was contacted today for further pre-op evaluation and reported doing well since last visit. She notes occasional brief palpitations but nothing significant. No chest pain, shortness of breath, acute CHF symptoms, or recent syncope. She is able to complete >4.0 METS without any problems. Given past medical history and time since last visit, based on ACC/AHA guidelines, Hannah Downs.    Per Pharmacy and office protocol, "patient can hold Warfarin for 5 days prior to procedure. Patient will not need bridging with Lovenox (enoxaparin) around procedure." Please restart this as soon as possible postoperatively."  Anticipate pt can proceed with planned procedure barring acute status change.   VS: BP (!) 140/97    Pulse 61    Temp 36.8 C (Oral)    Resp 16    Ht 5\' 1"  (1.549 m)    Wt 54 kg    SpO2 99%    BMI 22.48 kg/m   PROVIDERS: , MD is PCP   Hannah Montana, MD is Cardiologist  LABS: Labs reviewed: Acceptable for surgery. (all labs ordered are listed, but only abnormal results are displayed)  Labs Reviewed  BASIC METABOLIC PANEL - Abnormal; Notable  for the following components:      Result Value   Potassium 3.2 (*)    Glucose, Bld 107 (*)    All other components within normal limits  CBC     IMAGES:   EKG: 10/17/2020 Rate 83 bpm  NSR  CV: Echo 04/01/2020 1. Left ventricular ejection fraction, by estimation, is 55 to 60%. The  left ventricle has normal function. The left ventricle has no regional  wall motion abnormalities. There is mild left ventricular hypertrophy.  Left ventricular diastolic parameters  are consistent with Grade II diastolic dysfunction (pseudonormalization).  Elevated left atrial pressure.   2. Right ventricular systolic function is normal. The right ventricular  size is normal. There is mildly elevated pulmonary artery systolic  pressure. The estimated right ventricular systolic pressure is 38.3 mmHg.   3. Left atrial size was mildly dilated.   4. Right atrial size was mildly dilated.   5. The mitral valve is normal in structure. Trivial mitral valve  regurgitation.   6. Tricuspid valve regurgitation is mild to moderate.   7. The aortic valve is tricuspid. Aortic valve regurgitation is not  visualized. No aortic stenosis is present.   8. The inferior vena cava is normal in size with greater than 50%  respiratory variability, suggesting right atrial pressure of 3 mmHg.  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     Hannah Cipro Ward, PA-C [facility] [phone]

## 2021-09-07 ENCOUNTER — Other Ambulatory Visit: Payer: Self-pay | Admitting: Cardiovascular Disease

## 2021-09-17 DIAGNOSIS — K4090 Unilateral inguinal hernia, without obstruction or gangrene, not specified as recurrent: Secondary | ICD-10-CM

## 2021-09-17 NOTE — H&P (Signed)
Chief Complaint:  recurrent large left inguinal hernia  History of Present Illness:  Hannah Downs is an 76 y.o. female who presented with a large left inguinal hernia that has recurred at least twice.  .   Follow-up after her CT scan done back in July shows that she has some small layered gallstones but no cholecystitis, a large left inguinal hernia that thrice recurrent containing small bowel loops. She also has rectal vaginal and uterine prolapse. She had 12 children delivered vaginally. She has had a replacement of her right hip. Because she is in chronic atrial fib that is paroxysmal she does take Coumadin and sees a Coumadin clinic.  She had 2 repairs of this left inguinal hernia done back in 10/2006 and both failed pretty quickly because she has really no abdominal musculature to speak of. We discussed repair using the robot. I think that would be the best way to repair this with a large piece of mesh. I do know that a rectopexy is the right thing to do presently but perhaps we could do that at the same time although I would not do a resection.   Past Medical History:  Diagnosis Date   Arthritis    Dysrhythmia    afib   GERD (gastroesophageal reflux disease)    Hypercholesterolemia    Hypertension    Lumbar degenerative disc disease    Miscarriage    times 3   Osteopenia    Seasonal allergies    Varicose vein    right leg   Vitamin D deficiency     Past Surgical History:  Procedure Laterality Date   DILATION AND CURETTAGE OF UTERUS     hx of approx 26 years ago   HERNIA REPAIR     hx of in left groin times 2   TOTAL HIP ARTHROPLASTY Right 06/26/2014   Procedure: RIGHT TOTAL HIP ARTHROPLASTY ANTERIOR APPROACH WITH ACETABULAR AUTOGRAFT;  Surgeon: Gearlean Alf, MD;  Location: [facility];  Service: Orthopedics;  Laterality: Right;    No current facility-administered medications for this encounter.   Current Outpatient Medications  Medication Sig Dispense Refill    acetaminophen (TYLENOL) 500 MG tablet Take 1,000 mg by mouth at bedtime.     Cholecalciferol (VITAMIN D3) 50 MCG (2000 UT) TABS Take 2,000 Units by mouth in the morning.     metoprolol succinate (TOPROL-XL) 100 MG 24 hr tablet Take 1.5 tablets (150 mg total) by mouth daily. Take with or immediately following a meal. (Patient taking differently: Take 150 mg by mouth at bedtime. Take with or immediately following a meal.) 135 tablet 3   atorvastatin (LIPITOR) 20 MG tablet Take 1 tablet by mouth once daily 90 tablet 0   hydrochlorothiazide (HYDRODIURIL) 25 MG tablet Take 1 tablet by mouth once daily 90 tablet 0   warfarin (COUMADIN) 5 MG tablet TAKE 1 TABLET BY MOUTH ONCE DAILY AS  DIRECTED  BY  COUMADIN  CLINIC 35 tablet 1   Lisinopril Family History  Problem Relation Age of Onset   Cancer Mother    Atrial fibrillation Father    Stroke Father    Social History:   reports that she has never smoked. She has never used smokeless tobacco. She reports that she does not drink alcohol and does not use drugs.   REVIEW OF SYSTEMS : Negative except for see problem list  Physical Exam:   There were no vitals taken for this visit. There is no height or weight on file  to calculate BMI.  Gen:  WDWN WF NAD  Neurological: Alert and oriented to person, place, and time. Motor and sensory function is grossly intact  Head: Normocephalic and atraumatic.  Eyes: Conjunctivae are normal. Pupils are equal, round, and reactive to light. No scleral icterus.  Neck: Normal range of motion. Neck supple. No tracheal deviation or thyromegaly present.  Cardiovascular:  SR without murmurs or gallops.  No carotid bruits Breast:  not examined Respiratory: Effort normal.  No respiratory distress. No chest wall tenderness. Breath sounds normal.  No wheezes, rales or rhonchi.  Abdomen:  flat and nontender GU:  large left inguinal hernia Musculoskeletal: Normal range of motion. Extremities are nontender. No cyanosis, edema  or clubbing noted Lymphadenopathy: No cervical, preauricular, postauricular or axillary adenopathy is present Skin: Skin is warm and dry. No rash noted. No diaphoresis. No erythema. No pallor. Pscyh: Normal mood and affect. Behavior is normal. Judgment and thought content normal.   LABORATORY RESULTS: No results found for this or any previous visit (from the past 48 hour(s)).   RADIOLOGY RESULTS: No results found.  Problem List: Patient Active Problem List   Diagnosis Date Noted   Left inguinal hernia-multiple recurrence 09/17/2021   Aortic atherosclerosis ([place]) 08/04/2021   Essential hypertension 08/04/2021   Hyperlipidemia 08/04/2021   Gastroesophageal reflux disease without esophagitis 08/04/2021   Vitamin D deficiency 08/04/2021   Atrial fibrillation ([place]) 04/02/2020   Encounter for therapeutic drug monitoring 04/02/2020   OA (osteoarthritis) of hip 06/26/2014    Assessment & Plan: Thin WF with thrice recurrent left inguinal hernia for robotic repair    Matt B. Hassell Done, MD, [facility] [phone] beeper [phone]  09/17/2021 8:07 PM

## 2021-09-18 ENCOUNTER — Ambulatory Visit (HOSPITAL_COMMUNITY)
Admission: RE | Admit: 2021-09-18 | Discharge: 2021-09-18 | Disposition: A | Discharge status: Home / Self Care | Payer: Medicare Other | Source: Ambulatory Visit | Attending: Surgery | Admitting: Surgery

## 2021-09-18 ENCOUNTER — Ambulatory Visit (HOSPITAL_COMMUNITY): Payer: Medicare Other | Admitting: Certified Registered Nurse Anesthetist

## 2021-09-18 ENCOUNTER — Encounter (HOSPITAL_COMMUNITY): Payer: Self-pay | Admitting: Surgery

## 2021-09-18 ENCOUNTER — Encounter (HOSPITAL_COMMUNITY)
Admission: RE | Disposition: A | Discharge status: Home / Self Care | Payer: Self-pay | Source: Ambulatory Visit | Attending: Surgery

## 2021-09-18 ENCOUNTER — Ambulatory Visit (HOSPITAL_COMMUNITY): Payer: Medicare Other | Admitting: Physician Assistant

## 2021-09-18 DIAGNOSIS — K623 Rectal prolapse: Secondary | ICD-10-CM | POA: Insufficient documentation

## 2021-09-18 DIAGNOSIS — I10 Essential (primary) hypertension: Secondary | ICD-10-CM | POA: Insufficient documentation

## 2021-09-18 DIAGNOSIS — I480 Paroxysmal atrial fibrillation: Secondary | ICD-10-CM | POA: Insufficient documentation

## 2021-09-18 DIAGNOSIS — Z79899 Other long term (current) drug therapy: Secondary | ICD-10-CM | POA: Diagnosis not present

## 2021-09-18 DIAGNOSIS — Z7901 Long term (current) use of anticoagulants: Secondary | ICD-10-CM | POA: Insufficient documentation

## 2021-09-18 DIAGNOSIS — K4031 Unilateral inguinal hernia, with obstruction, without gangrene, recurrent: Secondary | ICD-10-CM | POA: Diagnosis not present

## 2021-09-18 DIAGNOSIS — K8020 Calculus of gallbladder without cholecystitis without obstruction: Secondary | ICD-10-CM | POA: Diagnosis not present

## 2021-09-18 DIAGNOSIS — N814 Uterovaginal prolapse, unspecified: Secondary | ICD-10-CM | POA: Diagnosis not present

## 2021-09-18 DIAGNOSIS — Z96641 Presence of right artificial hip joint: Secondary | ICD-10-CM | POA: Insufficient documentation

## 2021-09-18 DIAGNOSIS — K4091 Unilateral inguinal hernia, without obstruction or gangrene, recurrent: Secondary | ICD-10-CM | POA: Diagnosis present

## 2021-09-18 DIAGNOSIS — N736 Female pelvic peritoneal adhesions (postinfective): Secondary | ICD-10-CM | POA: Insufficient documentation

## 2021-09-18 DIAGNOSIS — K4090 Unilateral inguinal hernia, without obstruction or gangrene, not specified as recurrent: Secondary | ICD-10-CM

## 2021-09-18 HISTORY — PX: ROBOTIC ASSISTED DIAGNOSTIC LAPAROSCOPY: SHX6532

## 2021-09-18 LAB — PROTIME-INR
INR: 1 (ref 0.8–1.2)
Prothrombin Time: 13.4 seconds (ref 11.4–15.2)

## 2021-09-18 SURGERY — HERNIORRHAPHY, INGUINAL, ROBOT-ASSISTED, LAPAROSCOPIC
Anesthesia: General | Laterality: Left

## 2021-09-18 MED ORDER — CHLORHEXIDINE GLUCONATE 0.12 % MT SOLN: 15.0000 mL | Freq: Once | OROMUCOSAL | Status: AC

## 2021-09-18 MED ORDER — EPHEDRINE SULFATE-NACL 50-0.9 MG/10ML-% IV SOSY
PREFILLED_SYRINGE | INTRAVENOUS | Status: DC | PRN
  Administered 2021-09-18: 10 mg via INTRAVENOUS
  Administered 2021-09-18: 5 mg via INTRAVENOUS
  Administered 2021-09-18: 10 mg via INTRAVENOUS

## 2021-09-18 MED ORDER — PHENYLEPHRINE HCL-NACL 20-0.9 MG/250ML-% IV SOLN
INTRAVENOUS | Status: DC | PRN
  Administered 2021-09-18: 50 ug/min via INTRAVENOUS

## 2021-09-18 MED ORDER — PHENYLEPHRINE HCL (PRESSORS) 10 MG/ML IV SOLN
INTRAVENOUS | Status: AC
  Filled 2021-09-18: qty 1

## 2021-09-18 MED ORDER — PROPOFOL 10 MG/ML IV BOLUS
INTRAVENOUS | Status: DC | PRN
  Administered 2021-09-18: 120 mg via INTRAVENOUS

## 2021-09-18 MED ORDER — DEXAMETHASONE SODIUM PHOSPHATE 10 MG/ML IJ SOLN
INTRAMUSCULAR | Status: AC
  Filled 2021-09-18: qty 1

## 2021-09-18 MED ORDER — SODIUM CHLORIDE (PF) 0.9 % IJ SOLN
INTRAMUSCULAR | Status: AC
  Filled 2021-09-18: qty 20

## 2021-09-18 MED ORDER — CHLORHEXIDINE GLUCONATE CLOTH 2 % EX PADS: 6.0000 | MEDICATED_PAD | Freq: Once | CUTANEOUS | Status: DC

## 2021-09-18 MED ORDER — LACTATED RINGERS IV SOLN: INTRAVENOUS | Status: DC

## 2021-09-18 MED ORDER — FENTANYL CITRATE (PF) 250 MCG/5ML IJ SOLN
INTRAMUSCULAR | Status: DC | PRN
  Administered 2021-09-18 (×3): 50 ug via INTRAVENOUS

## 2021-09-18 MED ORDER — LIDOCAINE HCL (PF) 2 % IJ SOLN
INTRAMUSCULAR | Status: DC | PRN
  Administered 2021-09-18: 1.5 mg/kg/h via INTRADERMAL

## 2021-09-18 MED ORDER — LIDOCAINE 2% (20 MG/ML) 5 ML SYRINGE
INTRAMUSCULAR | Status: DC | PRN
  Administered 2021-09-18: 60 mg via INTRAVENOUS

## 2021-09-18 MED ORDER — LIDOCAINE HCL (PF) 2 % IJ SOLN
INTRAMUSCULAR | Status: AC
  Filled 2021-09-18: qty 10

## 2021-09-18 MED ORDER — BUPIVACAINE LIPOSOME 1.3 % IJ SUSP
INTRAMUSCULAR | Status: DC | PRN
  Administered 2021-09-18: 40 mL

## 2021-09-18 MED ORDER — PROPOFOL 10 MG/ML IV BOLUS
INTRAVENOUS | Status: AC
  Filled 2021-09-18: qty 20

## 2021-09-18 MED ORDER — SODIUM CHLORIDE (PF) 0.9 % IJ SOLN
INTRAMUSCULAR | Status: DC | PRN
Start: 2021-09-18 — End: 2021-09-18
  Administered 2021-09-18: 20 mL

## 2021-09-18 MED ORDER — GLYCOPYRROLATE 0.2 MG/ML IJ SOLN
INTRAMUSCULAR | Status: AC
  Filled 2021-09-18: qty 1

## 2021-09-18 MED ORDER — ROCURONIUM BROMIDE 10 MG/ML (PF) SYRINGE
PREFILLED_SYRINGE | INTRAVENOUS | Status: AC
  Filled 2021-09-18: qty 20

## 2021-09-18 MED ORDER — BUPIVACAINE LIPOSOME 1.3 % IJ SUSP: 20.0000 mL | Freq: Once | INTRAMUSCULAR | Status: DC

## 2021-09-18 MED ORDER — HYDROCODONE-ACETAMINOPHEN 5-325 MG PO TABS: 1.0000 | ORAL_TABLET | Freq: Four times a day (QID) | ORAL | 0 refills | Status: DC | PRN

## 2021-09-18 MED ORDER — KETAMINE HCL 50 MG/5ML IJ SOSY
PREFILLED_SYRINGE | INTRAMUSCULAR | Status: AC
  Filled 2021-09-18: qty 5

## 2021-09-18 MED ORDER — LIDOCAINE HCL (PF) 2 % IJ SOLN
INTRAMUSCULAR | Status: AC
  Filled 2021-09-18: qty 5

## 2021-09-18 MED ORDER — KETAMINE HCL 10 MG/ML IJ SOLN
INTRAMUSCULAR | Status: DC | PRN
  Administered 2021-09-18: 20 mg via INTRAVENOUS

## 2021-09-18 MED ORDER — AMISULPRIDE (ANTIEMETIC) 5 MG/2ML IV SOLN: 10.0000 mg | Freq: Once | INTRAVENOUS | Status: DC | PRN

## 2021-09-18 MED ORDER — ACETAMINOPHEN 500 MG PO TABS: 1000.0000 mg | ORAL_TABLET | Freq: Once | ORAL | Status: DC

## 2021-09-18 MED ORDER — ROCURONIUM BROMIDE 10 MG/ML (PF) SYRINGE
PREFILLED_SYRINGE | INTRAVENOUS | Status: AC
  Filled 2021-09-18: qty 10

## 2021-09-18 MED ORDER — ORAL CARE MOUTH RINSE
15.0000 mL | Freq: Once | OROMUCOSAL | Status: AC
  Administered 2021-09-18: 15 mL via OROMUCOSAL

## 2021-09-18 MED ORDER — BUPIVACAINE LIPOSOME 1.3 % IJ SUSP
INTRAMUSCULAR | Status: AC
  Filled 2021-09-18: qty 20

## 2021-09-18 MED ORDER — DEXAMETHASONE SODIUM PHOSPHATE 10 MG/ML IJ SOLN
INTRAMUSCULAR | Status: DC | PRN
  Administered 2021-09-18: 5 mg via INTRAVENOUS

## 2021-09-18 MED ORDER — ROCURONIUM BROMIDE 10 MG/ML (PF) SYRINGE
PREFILLED_SYRINGE | INTRAVENOUS | Status: DC | PRN
  Administered 2021-09-18: 40 mg via INTRAVENOUS
  Administered 2021-09-18 (×3): 20 mg via INTRAVENOUS

## 2021-09-18 MED ORDER — ONDANSETRON HCL 4 MG/2ML IJ SOLN
INTRAMUSCULAR | Status: DC | PRN
Start: 2021-09-18 — End: 2021-09-18
  Administered 2021-09-18: 4 mg via INTRAVENOUS

## 2021-09-18 MED ORDER — EPHEDRINE 5 MG/ML INJ
INTRAVENOUS | Status: AC
  Filled 2021-09-18: qty 5

## 2021-09-18 MED ORDER — FENTANYL CITRATE PF 50 MCG/ML IJ SOSY: 25.0000 ug | PREFILLED_SYRINGE | INTRAMUSCULAR | Status: DC | PRN

## 2021-09-18 MED ORDER — FENTANYL CITRATE (PF) 250 MCG/5ML IJ SOLN
INTRAMUSCULAR | Status: AC
  Filled 2021-09-18: qty 5

## 2021-09-18 MED ORDER — SCOPOLAMINE 1 MG/3DAYS TD PT72
1.0000 | MEDICATED_PATCH | TRANSDERMAL | Status: DC
  Administered 2021-09-18: 1.5 mg via TRANSDERMAL
  Filled 2021-09-18: qty 1

## 2021-09-18 MED ORDER — GLYCOPYRROLATE 0.2 MG/ML IJ SOLN
INTRAMUSCULAR | Status: DC | PRN
  Administered 2021-09-18: .2 mg via INTRAVENOUS

## 2021-09-18 MED ORDER — ACETAMINOPHEN 500 MG PO TABS
1000.0000 mg | ORAL_TABLET | ORAL | Status: AC
  Administered 2021-09-18: 1000 mg via ORAL
  Filled 2021-09-18: qty 2

## 2021-09-18 MED ORDER — CELECOXIB 200 MG PO CAPS
200.0000 mg | ORAL_CAPSULE | Freq: Once | ORAL | Status: AC
  Administered 2021-09-18: 200 mg via ORAL
  Filled 2021-09-18: qty 1

## 2021-09-18 MED ORDER — PHENYLEPHRINE 40 MCG/ML (10ML) SYRINGE FOR IV PUSH (FOR BLOOD PRESSURE SUPPORT)
PREFILLED_SYRINGE | INTRAVENOUS | Status: DC | PRN
  Administered 2021-09-18 (×3): 80 ug via INTRAVENOUS

## 2021-09-18 MED ORDER — 0.9 % SODIUM CHLORIDE (POUR BTL) OPTIME
TOPICAL | Status: DC | PRN
Start: 2021-09-18 — End: 2021-09-18
  Administered 2021-09-18: 1000 mL

## 2021-09-18 MED ORDER — CEFAZOLIN SODIUM-DEXTROSE 2-4 GM/100ML-% IV SOLN
2.0000 g | INTRAVENOUS | Status: AC
  Administered 2021-09-18: 2 g via INTRAVENOUS
  Filled 2021-09-18: qty 100

## 2021-09-18 SURGICAL SUPPLY — 58 items
APL PRP STRL LF DISP 70% ISPRP (MISCELLANEOUS) ×1
APL SWBSTK 6 STRL LF DISP (MISCELLANEOUS) ×2
APPLICATOR COTTON TIP 6 STRL (MISCELLANEOUS) ×2 IMPLANT
APPLICATOR COTTON TIP 6IN STRL (MISCELLANEOUS) ×4
BAG COUNTER SPONGE SURGICOUNT (BAG) IMPLANT
BAG SPNG CNTER NS LX DISP (BAG)
BLADE SURG 15 STRL LF DISP TIS (BLADE) ×1 IMPLANT
BLADE SURG 15 STRL SS (BLADE) ×2
CANNULA REDUC XI 12-8 STAPL (CANNULA) ×2
CANNULA REDUCER 12-8 DVNC XI (CANNULA) ×1 IMPLANT
CHLORAPREP W/TINT 26 (MISCELLANEOUS) ×2 IMPLANT
COVER MAYO STAND STRL (DRAPES) ×2 IMPLANT
COVER SURGICAL LIGHT HANDLE (MISCELLANEOUS) ×2 IMPLANT
COVER TIP SHEARS 8 DVNC (MISCELLANEOUS) ×1 IMPLANT
COVER TIP SHEARS 8MM DA VINCI (MISCELLANEOUS) ×2
DECANTER SPIKE VIAL GLASS SM (MISCELLANEOUS) ×2 IMPLANT
DRAPE ARM DVNC X/XI (DISPOSABLE) ×3 IMPLANT
DRAPE COLUMN DVNC XI (DISPOSABLE) ×1 IMPLANT
DRAPE DA VINCI XI ARM (DISPOSABLE) ×8
DRAPE DA VINCI XI COLUMN (DISPOSABLE) ×2
ELECT REM PT RETURN 15FT ADLT (MISCELLANEOUS) ×2 IMPLANT
GLOVE SURG ENC TEXT LTX SZ8 (GLOVE) ×4 IMPLANT
GOWN STRL REUS W/TWL XL LVL3 (GOWN DISPOSABLE) ×6 IMPLANT
GRASPER SUT TROCAR 14GX15 (MISCELLANEOUS) ×2 IMPLANT
IRRIG SUCT STRYKERFLOW 2 WTIP (MISCELLANEOUS) ×2
IRRIGATION SUCT STRKRFLW 2 WTP (MISCELLANEOUS) ×1 IMPLANT
KIT BASIN OR (CUSTOM PROCEDURE TRAY) ×2 IMPLANT
KIT TURNOVER KIT A (KITS) IMPLANT
MARKER SKIN DUAL TIP RULER LAB (MISCELLANEOUS) ×2 IMPLANT
MESH 3DMAX 4X6 LT LRG (Mesh General) ×1 IMPLANT
NEEDLE HYPO 22GX1.5 SAFETY (NEEDLE) ×2 IMPLANT
OBTURATOR OPTICAL STANDARD 8MM (TROCAR) ×2
OBTURATOR OPTICAL STND 8 DVNC (TROCAR) ×1
OBTURATOR OPTICALSTD 8 DVNC (TROCAR) ×1 IMPLANT
PACK CARDIOVASCULAR III (CUSTOM PROCEDURE TRAY) ×2 IMPLANT
PAD POSITIONING PINK XL (MISCELLANEOUS) ×2 IMPLANT
SCISSORS LAP 5X35 DISP (ENDOMECHANICALS) IMPLANT
SEAL CANN UNIV 5-8 DVNC XI (MISCELLANEOUS) ×2 IMPLANT
SEAL XI 5MM-8MM UNIVERSAL (MISCELLANEOUS) ×4
SOL ANTI FOG 6CC (MISCELLANEOUS) ×1 IMPLANT
SOLUTION ANTI FOG 6CC (MISCELLANEOUS) ×1
SOLUTION ELECTROLUBE (MISCELLANEOUS) ×2 IMPLANT
SPONGE T-LAP 18X18 ~~LOC~~+RFID (SPONGE) ×2 IMPLANT
STAPLER CANNULA SEAL DVNC XI (STAPLE) ×1 IMPLANT
STAPLER CANNULA SEAL XI (STAPLE) ×2
SUT MNCRL AB 4-0 PS2 18 (SUTURE) ×4 IMPLANT
SUT VIC AB 2-0 SH 27 (SUTURE) ×2
SUT VIC AB 2-0 SH 27X BRD (SUTURE) ×1 IMPLANT
SUT VICRYL 0 TIES 12 18 (SUTURE) ×2 IMPLANT
SUT VLOC 180 2-0 6IN GS21 (SUTURE) IMPLANT
SYR 10ML ECCENTRIC (SYRINGE) ×2 IMPLANT
SYR 20ML LL LF (SYRINGE) ×2 IMPLANT
TAPE STRIPS DRAPE STRL (GAUZE/BANDAGES/DRESSINGS) ×2 IMPLANT
TOWEL OR 17X26 10 PK STRL BLUE (TOWEL DISPOSABLE) ×2 IMPLANT
TOWEL OR NON WOVEN STRL DISP B (DISPOSABLE) ×2 IMPLANT
TROCAR ADV FIXATION 12X100MM (TROCAR) IMPLANT
TROCAR BLADELESS OPT 5 100 (ENDOMECHANICALS) ×2 IMPLANT
TUBING INSUFFLATION 10FT LAP (TUBING) ×2 IMPLANT

## 2021-09-18 NOTE — Transfer of Care (Signed)
Immediate Anesthesia Transfer of Care Note  Patient: Hannah Downs  Procedure(s) Performed: XI ROBOTIC ASSISTED, attempted left INGUINAL HERNIA (Left)  Patient Location: PACU  Anesthesia Type:General  Level of Consciousness: awake, alert , oriented and patient cooperative  Airway & Oxygen Therapy: Patient Spontanous Breathing and Patient connected to face mask oxygen  Post-op Assessment: Report given to RN, Post -op Vital signs reviewed and stable and Patient moving all extremities  Post vital signs: Reviewed and stable  Last Vitals:  Vitals Value Taken Time  BP 122/67 09/18/21 1115  Temp 36.5 C 09/18/21 1115  Pulse 72 09/18/21 1125  Resp 15 09/18/21 1123  SpO2 100 % 09/18/21 1125  Vitals shown include unvalidated device data.  Last Pain:  Vitals:   09/18/21 1115  TempSrc:   PainSc: Asleep         Complications: No notable events documented.

## 2021-09-18 NOTE — Anesthesia Procedure Notes (Signed)
Procedure Name: Intubation Date/Time: 09/18/2021 7:39 AM Performed by: Myna Bright, CRNA Pre-anesthesia Checklist: Patient identified, Emergency Drugs available, Patient being monitored and Suction available Patient Re-evaluated:Patient Re-evaluated prior to induction Oxygen Delivery Method: Circle system utilized Preoxygenation: Pre-oxygenation with 100% oxygen Induction Type: IV induction Ventilation: Mask ventilation without difficulty Laryngoscope Size: 3 Grade View: Grade I Tube type: Oral Tube size: 7.0 mm Number of attempts: 1 Airway Equipment and Method: Stylet Placement Confirmation: ETT inserted through vocal cords under direct vision, positive ETCO2 and breath sounds checked- equal and bilateral Secured at: 20 cm Tube secured with: Tape Dental Injury: Teeth and Oropharynx as per pre-operative assessment

## 2021-09-18 NOTE — Interval H&P Note (Signed)
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  The various methods of treatment have been discussed with the patient and family. After consideration of risks, benefits and other options for treatment, the patient has consented to  Procedure(s): XI ROBOTIC ASSISTED left INGUINAL HERNIA repair with possible rectopexy (Left) as a surgical intervention.  The patient's history has been reviewed, patient examined, no change in status, stable for surgery.  I have reviewed the patient's chart and labs.  Questions were answered to the patient's satisfaction.     Valarie Merino

## 2021-09-18 NOTE — Anesthesia Postprocedure Evaluation (Signed)
Anesthesia Post Note  Patient: Hannah Downs  Procedure(s) Performed: XI ROBOTIC ASSISTED, attempted left INGUINAL HERNIA (Left)     Patient location during evaluation: PACU Anesthesia Type: General Level of consciousness: awake and alert Pain management: pain level controlled Vital Signs Assessment: post-procedure vital signs reviewed and stable Respiratory status: spontaneous breathing, nonlabored ventilation, respiratory function stable and patient connected to nasal cannula oxygen Cardiovascular status: blood pressure returned to baseline and stable Postop Assessment: no apparent nausea or vomiting Anesthetic complications: no   No notable events documented.  Last Vitals:  Vitals:   09/18/21 1220 09/18/21 1300  BP: 125/77 128/80  Pulse: (!) 58 68  Resp: 16 16  Temp:    SpO2: 96% 98%    Last Pain:  Vitals:   09/18/21 1300  TempSrc:   PainSc: 0-No pain                 Tiajuana Amass

## 2021-09-18 NOTE — Op Note (Signed)
Hannah Downs  [birth year]   09/18/2021    PCP:  [name], MD   Surgeon: Wenda Low, MD, FACS  Asst:  none  Anes:  general  Preop Dx: Thrice recurrent left inguinal hernia Postop Dx: Same; large slider with sliding components of large bowel and probably medial bladder the extention into the left vaginal area with complete uterine prolapse.    Procedure: Robotic partial takedown of incarcerated LIH revealing nonreducible sliding hernia and unable to free the posterior border of the hernia Location Surgery: [facility] Complications:  None noted  EBL:   minimal cc  Drains: none  Description of Procedure:  The patient was taken to OR 2 .  After anesthesia was administered and the patient was prepped  with chloroprep  and a timeout was performed.  Access the abdomen was achieved through the left upper quadrant with a 5 mm Optiview and this site was used to expand for a 12 robotic trocar and then 2 more 8 trochars were placed medially and then over on the right side.  The hernia was incarcerated on the left side.  It was it was visible about the size of a grapefruit in the left groin.  Also was noted was that she had a complete prolapse of her uterus.  We docked the robot and then I created flaps and carried these down anteriorly and began dissecting the hernia sac out.  It appeared to be a very large direct defect.  As I came down into the around this it look like the bladder may be going down into it and being part of a sliding hernia medially and then posteriorly after reducing several loops of small bowel and if: It appeared that the colon was fixed to the wall and further attempts to get it reduced were unsuccessful.  I did not want to create any enterotomies.  I considered an anterior approach and went to the table and palpated while looking with the scope and medially the wall of the hernia seem to go very near the left lateral wall of the vagina which was everted by the  prolapsed uterus.  The defect itself appeared to be more complicated than it presented anteriorly and it seemed like there were defects going anteriorly into the sac but also more posteriorly and involves sliding components.  I did not feel that I could get around this safely and get it reduced and try to successfully do a hernia repair since it had been tried 3 times before.  There is a heavy average of mesh anteriorly which you can feel which formed the upper region of this recurrent hernia.  I elected to reperitonealized the left inguinal region having reduced quite a bit but and also reduced some adhesions were stuck in there but the hernia remains.  I explained this to her husband and any repair would likely need to be done in the form of a laparotomy that might involve a preop bowel prep but also to consider hysterectomy and anterior and posterior repair possibly rectopexy as well as herniorrhaphy.  This is something that would need to be discussed with her before pursuing.  The patient tolerated the procedure well and was taken to the PACU in stable condition.     Matt B. Daphine Deutscher, MD, [facility], [place] [phone]

## 2021-09-20 ENCOUNTER — Other Ambulatory Visit: Payer: Self-pay

## 2021-09-20 ENCOUNTER — Encounter (HOSPITAL_BASED_OUTPATIENT_CLINIC_OR_DEPARTMENT_OTHER): Payer: Self-pay | Admitting: Emergency Medicine

## 2021-09-20 ENCOUNTER — Emergency Department (HOSPITAL_BASED_OUTPATIENT_CLINIC_OR_DEPARTMENT_OTHER)
Admission: EM | Admit: 2021-09-20 | Discharge: 2021-09-20 | Disposition: A | Discharge status: Home / Self Care | Payer: Medicare Other | Attending: Emergency Medicine | Admitting: Emergency Medicine

## 2021-09-20 DIAGNOSIS — Z4889 Encounter for other specified surgical aftercare: Secondary | ICD-10-CM | POA: Insufficient documentation

## 2021-09-20 DIAGNOSIS — N814 Uterovaginal prolapse, unspecified: Secondary | ICD-10-CM | POA: Diagnosis not present

## 2021-09-20 DIAGNOSIS — R339 Retention of urine, unspecified: Secondary | ICD-10-CM | POA: Diagnosis not present

## 2021-09-20 DIAGNOSIS — N9989 Other postprocedural complications and disorders of genitourinary system: Secondary | ICD-10-CM | POA: Diagnosis not present

## 2021-09-20 DIAGNOSIS — N8110 Cystocele, unspecified: Secondary | ICD-10-CM

## 2021-09-20 DIAGNOSIS — Q794 Prune belly syndrome: Secondary | ICD-10-CM | POA: Insufficient documentation

## 2021-09-20 DIAGNOSIS — Z09 Encounter for follow-up examination after completed treatment for conditions other than malignant neoplasm: Secondary | ICD-10-CM

## 2021-09-20 LAB — URINALYSIS, ROUTINE W REFLEX MICROSCOPIC
Bilirubin Urine: NEGATIVE
Glucose, UA: NEGATIVE mg/dL
Leukocytes,Ua: NEGATIVE
Nitrite: NEGATIVE
Protein, ur: NEGATIVE mg/dL
Specific Gravity, Urine: 1.015 (ref 1.005–1.030)
pH: 6 (ref 5.0–8.0)

## 2021-09-20 MED ORDER — IBUPROFEN 400 MG PO TABS
600.0000 mg | ORAL_TABLET | Freq: Once | ORAL | Status: AC
  Administered 2021-09-20: 600 mg via ORAL
  Filled 2021-09-20: qty 1

## 2021-09-20 NOTE — ED Notes (Signed)
Attempted insertion of foley. Pt is extremely swollen, has prolapse uterus, unable to pass.

## 2021-09-20 NOTE — ED Notes (Signed)
Pt only able to void 10cc. PVR 600. Will consult with MD for further management.

## 2021-09-20 NOTE — ED Notes (Signed)
Dc instructions reviewed with patient. Patient voiced understanding. Dc with belongings. Reviewed foley care/how to use leg bag. Pts daughters voice understanding.

## 2021-09-20 NOTE — ED Triage Notes (Signed)
Pt had surgery attempt Friday that was unsuccessful due to prolapse uterus and rectum. Pt reports unable to urinate, last full urination was Thursday. Family reports patient has been disoriented as of yesterday.

## 2021-09-20 NOTE — ED Provider Notes (Signed)
[facility] Provider Note   CSN: [id number] Arrival date & time: 09/20/21  0856     History  Chief Complaint  Patient presents with   Urinary Retention    Hannah Downs is a 76 y.o. female.  Patient is a 76 yo female presenting for urinary retention. Patient states she went to OR 09/18/20 approx 2 days ago for hernia repair, had a foley cath, and was told they couldn't perform surgery due to uterine and bladder prolapse. States since discharge she has had difficulty urinating with new back pain. Denies emptying bladder since foley cath removal.   The history is provided by the patient. No language interpreter was used.      Home Medications Prior to Admission medications   Medication Sig Start Date End Date Taking? Authorizing Provider  acetaminophen (TYLENOL) 500 MG tablet Take 1,000 mg by mouth at bedtime.    [provider]  atorvastatin (LIPITOR) 20 MG tablet Take 1 tablet by mouth once daily 09/08/21   Nahser, Wonda Cheng, MD  Cholecalciferol (VITAMIN D3) 50 MCG (2000 UT) TABS Take 2,000 Units by mouth in the morning.    [provider]  hydrochlorothiazide (HYDRODIURIL) 25 MG tablet Take 1 tablet by mouth once daily 09/08/21   Nahser, Wonda Cheng, MD  HYDROcodone-acetaminophen (NORCO/VICODIN) 5-325 MG tablet Take 1 tablet by mouth every 6 (six) hours as needed for moderate pain. 09/18/21   Johnathan Hausen, MD  metoprolol succinate (TOPROL-XL) 100 MG 24 hr tablet Take 1.5 tablets (150 mg total) by mouth daily. Take with or immediately following a meal. Patient taking differently: Take 150 mg by mouth at bedtime. Take with or immediately following a meal. 10/17/20   Nahser, Wonda Cheng, MD  warfarin (COUMADIN) 5 MG tablet TAKE 1 TABLET BY MOUTH ONCE DAILY AS  DIRECTED  BY  [facility]  CLIN[facility] 09/08/21   Nahser, Wonda Cheng, MD      Allergies    Lisinopril    Review of Systems   Review of Systems  Constitutional:  Negative for chills and  fever.  HENT:  Negative for ear pain and sore throat.   Eyes:  Negative for pain and visual disturbance.  Respiratory:  Negative for cough and shortness of breath.   Cardiovascular:  Negative for chest pain and palpitations.  Gastrointestinal:  Negative for abdominal pain and vomiting.  Genitourinary:  Positive for difficulty urinating. Negative for dysuria and hematuria.  Musculoskeletal:  Positive for back pain. Negative for arthralgias.  Skin:  Negative for color change and rash.  Neurological:  Negative for seizures and syncope.  All other systems reviewed and are negative.  Physical Exam Updated Vital Signs BP (!) 171/96 (BP Location: Right Arm)    Pulse 61    Temp 98.6 F (37 C) (Oral)    Resp 20    Ht 5\' 1"  (1.549 m)    Wt 54 kg    SpO2 99%    BMI 22.48 kg/m  Physical Exam Vitals and nursing note reviewed.  Constitutional:      General: She is not in acute distress.    Appearance: She is well-developed.  HENT:     Head: Normocephalic and atraumatic.  Eyes:     Conjunctiva/sclera: Conjunctivae normal.  Cardiovascular:     Rate and Rhythm: Normal rate and regular rhythm.     Heart sounds: No murmur heard. Pulmonary:     Effort: Pulmonary effort is normal. No respiratory distress.     Breath sounds:  Normal breath sounds.  Abdominal:     Palpations: Abdomen is soft.     Tenderness: There is no abdominal tenderness.  Genitourinary:    Comments: Significant bladder and uterine prolapse resulting in abnormal location of urethra. Musculoskeletal:        General: No swelling.     Cervical back: Neck supple.  Skin:    General: Skin is warm and dry.     Capillary Refill: Capillary refill takes less than 2 seconds.  Neurological:     Mental Status: She is alert.  Psychiatric:        Mood and Affect: Mood normal.    ED Results / Procedures / Treatments   Labs (all labs ordered are listed, but only abnormal results are displayed) Labs Reviewed  URINALYSIS, ROUTINE W  REFLEX MICROSCOPIC    EKG None  Radiology No results found.  Procedures Procedures    Medications Ordered in ED Medications  ibuprofen (ADVIL) tablet 600 mg (has no administration in time range)    ED Course/ Medical Decision Making/ A&P                           Medical Decision Making  9:34 AM 76 yo female presenting for urinary retention. Post void residual demonstrates 600 ml retention. GU exam demonstrates significant bladder and uterine prolapse resulting in abnormal location of urethra. Able to insert foley catheter with MD help. Patient given close follow up with urology for foley cath and bladder prolaspse. UA demonstrates no UTI.   Patient in no distress and overall condition improved here in the ED. Detailed discussions were had with the patient regarding current findings, and need for close f/u with PCP or on call doctor. The patient has been instructed to return immediately if the symptoms worsen in any way for re-evaluation. Patient verbalized understanding and is in agreement with current care plan. All questions answered prior to discharge.          Final Clinical Impression(s) / ED Diagnoses Final diagnoses:  Uterine prolapse  Female bladder prolapse  Urinary retention  Postop check    Rx / DC Orders ED Discharge Orders     None         Lianne Cure, DO [id number]

## 2021-09-20 NOTE — ED Notes (Signed)
Dr. Pearline Cables placed 14 french foley per protocol. Immediate return of 800 cc clear yellow urine.

## 2021-09-20 NOTE — ED Notes (Signed)
Post void residual 667ml.

## 2021-09-22 DIAGNOSIS — R3914 Feeling of incomplete bladder emptying: Secondary | ICD-10-CM | POA: Diagnosis not present

## 2021-09-30 ENCOUNTER — Ambulatory Visit (INDEPENDENT_AMBULATORY_CARE_PROVIDER_SITE_OTHER): Payer: Medicare Other | Admitting: *Deleted

## 2021-09-30 ENCOUNTER — Other Ambulatory Visit: Payer: Self-pay

## 2021-09-30 DIAGNOSIS — I4891 Unspecified atrial fibrillation: Secondary | ICD-10-CM | POA: Diagnosis not present

## 2021-09-30 DIAGNOSIS — Z5181 Encounter for therapeutic drug level monitoring: Secondary | ICD-10-CM | POA: Diagnosis not present

## 2021-09-30 LAB — POCT INR: INR: 2.1 (ref 2.0–3.0)

## 2021-09-30 NOTE — Patient Instructions (Signed)
Description   Continue taking 1 tablet daily. Call for any changes in medications or up coming procedures. Recheck INR in 4 weeks.  [facility] [phone] Main [phone]

## 2021-10-14 DIAGNOSIS — Z8673 Personal history of transient ischemic attack (TIA), and cerebral infarction without residual deficits: Secondary | ICD-10-CM

## 2021-10-14 HISTORY — DX: Personal history of transient ischemic attack (TIA), and cerebral infarction without residual deficits: Z86.73

## 2021-10-20 DIAGNOSIS — R3914 Feeling of incomplete bladder emptying: Secondary | ICD-10-CM | POA: Diagnosis not present

## 2021-10-26 ENCOUNTER — Ambulatory Visit: Payer: Medicare Other | Admitting: *Deleted

## 2021-10-26 ENCOUNTER — Other Ambulatory Visit: Payer: Self-pay

## 2021-10-26 DIAGNOSIS — Z5181 Encounter for therapeutic drug level monitoring: Secondary | ICD-10-CM | POA: Diagnosis not present

## 2021-10-26 DIAGNOSIS — I4891 Unspecified atrial fibrillation: Secondary | ICD-10-CM

## 2021-10-26 LAB — POCT INR: INR: 1.9 — AB (ref 2.0–3.0)

## 2021-10-26 NOTE — Patient Instructions (Addendum)
Description   Take 1.5 tablets today, then continue taking 1 tablet daily. Call for any changes in medications or up coming procedures. Recheck INR in 3 weeks.  [facility] [phone] Main [phone]

## 2021-10-27 ENCOUNTER — Other Ambulatory Visit: Payer: Self-pay | Admitting: Cardiovascular Disease

## 2021-10-30 DIAGNOSIS — F4320 Adjustment disorder, unspecified: Secondary | ICD-10-CM | POA: Diagnosis present

## 2021-10-30 DIAGNOSIS — D6869 Other thrombophilia: Secondary | ICD-10-CM | POA: Insufficient documentation

## 2021-11-04 ENCOUNTER — Emergency Department (HOSPITAL_COMMUNITY): Payer: Medicare Other

## 2021-11-04 ENCOUNTER — Encounter (HOSPITAL_COMMUNITY): Admission: EM | Disposition: A | Discharge status: Home / Self Care | Payer: Self-pay | Source: Home / Self Care

## 2021-11-04 ENCOUNTER — Emergency Department (HOSPITAL_COMMUNITY): Payer: Medicare Other | Admitting: Anesthesiology

## 2021-11-04 ENCOUNTER — Inpatient Hospital Stay (HOSPITAL_COMMUNITY)
Admission: EM | Admit: 2021-11-04 | Discharge: 2021-11-09 | DRG: 350 | Disposition: A | Discharge status: Home / Self Care | Payer: Medicare Other | Attending: Internal Medicine | Admitting: Internal Medicine

## 2021-11-04 ENCOUNTER — Encounter (HOSPITAL_COMMUNITY): Payer: Self-pay

## 2021-11-04 DIAGNOSIS — R17 Unspecified jaundice: Secondary | ICD-10-CM

## 2021-11-04 DIAGNOSIS — E7800 Pure hypercholesterolemia, unspecified: Secondary | ICD-10-CM | POA: Diagnosis present

## 2021-11-04 DIAGNOSIS — I10 Essential (primary) hypertension: Secondary | ICD-10-CM | POA: Diagnosis present

## 2021-11-04 DIAGNOSIS — G8191 Hemiplegia, unspecified affecting right dominant side: Secondary | ICD-10-CM | POA: Diagnosis not present

## 2021-11-04 DIAGNOSIS — D72829 Elevated white blood cell count, unspecified: Secondary | ICD-10-CM

## 2021-11-04 DIAGNOSIS — K4031 Unilateral inguinal hernia, with obstruction, without gangrene, recurrent: Secondary | ICD-10-CM | POA: Diagnosis not present

## 2021-11-04 DIAGNOSIS — H7093 Unspecified mastoiditis, bilateral: Secondary | ICD-10-CM | POA: Diagnosis not present

## 2021-11-04 DIAGNOSIS — K450 Other specified abdominal hernia with obstruction, without gangrene: Secondary | ICD-10-CM | POA: Diagnosis not present

## 2021-11-04 DIAGNOSIS — I97821 Postprocedural cerebrovascular infarction during other surgery: Secondary | ICD-10-CM | POA: Diagnosis not present

## 2021-11-04 DIAGNOSIS — R200 Anesthesia of skin: Secondary | ICD-10-CM | POA: Diagnosis not present

## 2021-11-04 DIAGNOSIS — I6340 Cerebral infarction due to embolism of unspecified cerebral artery: Secondary | ICD-10-CM | POA: Diagnosis not present

## 2021-11-04 DIAGNOSIS — K4190 Unilateral femoral hernia, without obstruction or gangrene, not specified as recurrent: Secondary | ICD-10-CM

## 2021-11-04 DIAGNOSIS — J439 Emphysema, unspecified: Secondary | ICD-10-CM | POA: Diagnosis not present

## 2021-11-04 DIAGNOSIS — K565 Intestinal adhesions [bands], unspecified as to partial versus complete obstruction: Secondary | ICD-10-CM

## 2021-11-04 DIAGNOSIS — Z79899 Other long term (current) drug therapy: Secondary | ICD-10-CM | POA: Diagnosis not present

## 2021-11-04 DIAGNOSIS — K4030 Unilateral inguinal hernia, with obstruction, without gangrene, not specified as recurrent: Secondary | ICD-10-CM

## 2021-11-04 DIAGNOSIS — R29700 NIHSS score 0: Secondary | ICD-10-CM | POA: Diagnosis not present

## 2021-11-04 DIAGNOSIS — Z823 Family history of stroke: Secondary | ICD-10-CM

## 2021-11-04 DIAGNOSIS — Z96641 Presence of right artificial hip joint: Secondary | ICD-10-CM | POA: Diagnosis present

## 2021-11-04 DIAGNOSIS — J302 Other seasonal allergic rhinitis: Secondary | ICD-10-CM

## 2021-11-04 DIAGNOSIS — J9811 Atelectasis: Secondary | ICD-10-CM | POA: Diagnosis not present

## 2021-11-04 DIAGNOSIS — N179 Acute kidney failure, unspecified: Secondary | ICD-10-CM | POA: Diagnosis not present

## 2021-11-04 DIAGNOSIS — E871 Hypo-osmolality and hyponatremia: Secondary | ICD-10-CM | POA: Diagnosis not present

## 2021-11-04 DIAGNOSIS — U071 COVID-19: Secondary | ICD-10-CM | POA: Diagnosis present

## 2021-11-04 DIAGNOSIS — M5136 Other intervertebral disc degeneration, lumbar region: Secondary | ICD-10-CM | POA: Diagnosis present

## 2021-11-04 DIAGNOSIS — E861 Hypovolemia: Secondary | ICD-10-CM | POA: Diagnosis not present

## 2021-11-04 DIAGNOSIS — Z8616 Personal history of COVID-19: Secondary | ICD-10-CM

## 2021-11-04 DIAGNOSIS — K4131 Unilateral femoral hernia, with obstruction, without gangrene, recurrent: Secondary | ICD-10-CM | POA: Diagnosis present

## 2021-11-04 DIAGNOSIS — R55 Syncope and collapse: Secondary | ICD-10-CM | POA: Diagnosis not present

## 2021-11-04 DIAGNOSIS — E785 Hyperlipidemia, unspecified: Secondary | ICD-10-CM | POA: Diagnosis present

## 2021-11-04 DIAGNOSIS — R8271 Bacteriuria: Secondary | ICD-10-CM | POA: Diagnosis not present

## 2021-11-04 DIAGNOSIS — Z888 Allergy status to other drugs, medicaments and biological substances status: Secondary | ICD-10-CM | POA: Diagnosis not present

## 2021-11-04 DIAGNOSIS — R109 Unspecified abdominal pain: Secondary | ICD-10-CM | POA: Diagnosis not present

## 2021-11-04 DIAGNOSIS — N814 Uterovaginal prolapse, unspecified: Secondary | ICD-10-CM | POA: Diagnosis present

## 2021-11-04 DIAGNOSIS — K56609 Unspecified intestinal obstruction, unspecified as to partial versus complete obstruction: Secondary | ICD-10-CM

## 2021-11-04 DIAGNOSIS — R202 Paresthesia of skin: Secondary | ICD-10-CM | POA: Diagnosis not present

## 2021-11-04 DIAGNOSIS — E559 Vitamin D deficiency, unspecified: Secondary | ICD-10-CM | POA: Diagnosis present

## 2021-11-04 DIAGNOSIS — Z809 Family history of malignant neoplasm, unspecified: Secondary | ICD-10-CM | POA: Diagnosis not present

## 2021-11-04 DIAGNOSIS — M8580 Other specified disorders of bone density and structure, unspecified site: Secondary | ICD-10-CM | POA: Diagnosis not present

## 2021-11-04 DIAGNOSIS — R4781 Slurred speech: Secondary | ICD-10-CM | POA: Diagnosis not present

## 2021-11-04 DIAGNOSIS — Z7901 Long term (current) use of anticoagulants: Secondary | ICD-10-CM

## 2021-11-04 DIAGNOSIS — F4320 Adjustment disorder, unspecified: Secondary | ICD-10-CM | POA: Diagnosis present

## 2021-11-04 DIAGNOSIS — Z20822 Contact with and (suspected) exposure to covid-19: Secondary | ICD-10-CM | POA: Diagnosis present

## 2021-11-04 DIAGNOSIS — I4891 Unspecified atrial fibrillation: Secondary | ICD-10-CM | POA: Diagnosis present

## 2021-11-04 DIAGNOSIS — I480 Paroxysmal atrial fibrillation: Secondary | ICD-10-CM | POA: Diagnosis not present

## 2021-11-04 DIAGNOSIS — I639 Cerebral infarction, unspecified: Secondary | ICD-10-CM | POA: Diagnosis not present

## 2021-11-04 DIAGNOSIS — I4811 Longstanding persistent atrial fibrillation: Secondary | ICD-10-CM

## 2021-11-04 DIAGNOSIS — K219 Gastro-esophageal reflux disease without esophagitis: Secondary | ICD-10-CM | POA: Diagnosis not present

## 2021-11-04 DIAGNOSIS — R9389 Abnormal findings on diagnostic imaging of other specified body structures: Secondary | ICD-10-CM

## 2021-11-04 DIAGNOSIS — M1990 Unspecified osteoarthritis, unspecified site: Secondary | ICD-10-CM | POA: Diagnosis present

## 2021-11-04 DIAGNOSIS — K4121 Bilateral femoral hernia, without obstruction or gangrene, recurrent: Secondary | ICD-10-CM | POA: Diagnosis not present

## 2021-11-04 DIAGNOSIS — K458 Other specified abdominal hernia without obstruction or gangrene: Secondary | ICD-10-CM | POA: Diagnosis not present

## 2021-11-04 DIAGNOSIS — R9431 Abnormal electrocardiogram [ECG] [EKG]: Secondary | ICD-10-CM | POA: Diagnosis not present

## 2021-11-04 DIAGNOSIS — I6389 Other cerebral infarction: Secondary | ICD-10-CM | POA: Diagnosis not present

## 2021-11-04 DIAGNOSIS — K4130 Unilateral femoral hernia, with obstruction, without gangrene, not specified as recurrent: Secondary | ICD-10-CM | POA: Diagnosis present

## 2021-11-04 DIAGNOSIS — K4090 Unilateral inguinal hernia, without obstruction or gangrene, not specified as recurrent: Secondary | ICD-10-CM

## 2021-11-04 HISTORY — PX: INGUINAL HERNIA REPAIR: SHX194

## 2021-11-04 LAB — I-STAT CHEM 8, ED
BUN: 16 mg/dL (ref 8–23)
Calcium, Ion: 1.02 mmol/L — ABNORMAL LOW (ref 1.15–1.40)
Chloride: 94 mmol/L — ABNORMAL LOW (ref 98–111)
Creatinine, Ser: 0.6 mg/dL (ref 0.44–1.00)
Glucose, Bld: 131 mg/dL — ABNORMAL HIGH (ref 70–99)
HCT: 47 % — ABNORMAL HIGH (ref 36.0–46.0)
Hemoglobin: 16 g/dL — ABNORMAL HIGH (ref 12.0–15.0)
Potassium: 3.8 mmol/L (ref 3.5–5.1)
Sodium: 130 mmol/L — ABNORMAL LOW (ref 135–145)
TCO2: 28 mmol/L (ref 22–32)

## 2021-11-04 LAB — URINALYSIS, ROUTINE W REFLEX MICROSCOPIC
Bilirubin Urine: NEGATIVE
Glucose, UA: NEGATIVE mg/dL
Ketones, ur: 15 mg/dL — AB
Nitrite: NEGATIVE
Protein, ur: NEGATIVE mg/dL
Specific Gravity, Urine: <1.005 — ABNORMAL LOW (ref 1.005–1.030)
pH: 6.5 (ref 5.0–8.0)

## 2021-11-04 LAB — CBC WITH DIFFERENTIAL/PLATELET
Abs Immature Granulocytes: 0.04 10*3/uL (ref 0.00–0.07)
Basophils Absolute: 0.1 10*3/uL (ref 0.0–0.1)
Basophils Relative: 0 %
Eosinophils Absolute: 0 10*3/uL (ref 0.0–0.5)
Eosinophils Relative: 0 %
HCT: 44.6 % (ref 36.0–46.0)
Hemoglobin: 15.2 g/dL — ABNORMAL HIGH (ref 12.0–15.0)
Immature Granulocytes: 0 %
Lymphocytes Relative: 8 %
Lymphs Abs: 1 10*3/uL (ref 0.7–4.0)
MCH: 30.2 pg (ref 26.0–34.0)
MCHC: 34.1 g/dL (ref 30.0–36.0)
MCV: 88.7 fL (ref 80.0–100.0)
Monocytes Absolute: 0.4 10*3/uL (ref 0.1–1.0)
Monocytes Relative: 3 %
Neutro Abs: 11.3 10*3/uL — ABNORMAL HIGH (ref 1.7–7.7)
Neutrophils Relative %: 89 %
Platelets: 341 10*3/uL (ref 150–400)
RBC: 5.03 MIL/uL (ref 3.87–5.11)
RDW: 12.9 % (ref 11.5–15.5)
WBC: 12.9 10*3/uL — ABNORMAL HIGH (ref 4.0–10.5)
nRBC: 0 % (ref 0.0–0.2)

## 2021-11-04 LAB — TYPE AND SCREEN
ABO/RH(D): O POS
Antibody Screen: NEGATIVE

## 2021-11-04 LAB — BASIC METABOLIC PANEL
Anion gap: 12 (ref 5–15)
BUN: 18 mg/dL (ref 8–23)
CO2: 24 mmol/L (ref 22–32)
Calcium: 10 mg/dL (ref 8.9–10.3)
Chloride: 93 mmol/L — ABNORMAL LOW (ref 98–111)
Creatinine, Ser: 0.64 mg/dL (ref 0.44–1.00)
GFR, Estimated: >60 mL/min (ref >60)
Glucose, Bld: 129 mg/dL — ABNORMAL HIGH (ref 70–99)
Potassium: 3.7 mmol/L (ref 3.5–5.1)
Sodium: 129 mmol/L — ABNORMAL LOW (ref 135–145)

## 2021-11-04 LAB — RESP PANEL BY RT-PCR (FLU A&B, COVID) ARPGX2
Influenza A by PCR: NEGATIVE
Influenza B by PCR: NEGATIVE
SARS Coronavirus 2 by RT PCR: POSITIVE — AB

## 2021-11-04 LAB — PROTIME-INR
INR: 2.7 — ABNORMAL HIGH (ref 0.8–1.2)
Prothrombin Time: 28.8 seconds — ABNORMAL HIGH (ref 11.4–15.2)

## 2021-11-04 LAB — LACTIC ACID, PLASMA: Lactic Acid, Venous: 1.3 mmol/L (ref 0.5–1.9)

## 2021-11-04 SURGERY — REPAIR, HERNIA, INGUINAL, LAPAROSCOPIC
Anesthesia: General | Site: Inguinal

## 2021-11-04 MED ORDER — CLINDAMYCIN PHOSPHATE 900 MG/50ML IV SOLN
900.0000 mg | INTRAVENOUS | Status: AC
  Administered 2021-11-04: 900 mg via INTRAVENOUS
  Filled 2021-11-04: qty 50

## 2021-11-04 MED ORDER — BUPIVACAINE-EPINEPHRINE (PF) 0.25% -1:200000 IJ SOLN
INTRAMUSCULAR | Status: AC
  Filled 2021-11-04: qty 60

## 2021-11-04 MED ORDER — ROCURONIUM BROMIDE 10 MG/ML (PF) SYRINGE
PREFILLED_SYRINGE | INTRAVENOUS | Status: AC
  Filled 2021-11-04: qty 10

## 2021-11-04 MED ORDER — EPHEDRINE SULFATE-NACL 50-0.9 MG/10ML-% IV SOSY
PREFILLED_SYRINGE | INTRAVENOUS | Status: DC | PRN
  Administered 2021-11-04 – 2021-11-05 (×2): 5 mg via INTRAVENOUS

## 2021-11-04 MED ORDER — VITAMIN K1 10 MG/ML IJ SOLN
10.0000 mg | INTRAVENOUS | Status: AC
  Administered 2021-11-04: 10 mg via INTRAVENOUS
  Filled 2021-11-04: qty 1

## 2021-11-04 MED ORDER — GENTAMICIN SULFATE 40 MG/ML IJ SOLN
5.0000 mg/kg | INTRAVENOUS | Status: AC
  Administered 2021-11-04: 260 mg via INTRAVENOUS
  Filled 2021-11-04: qty 6.5

## 2021-11-04 MED ORDER — CHLORHEXIDINE GLUCONATE CLOTH 2 % EX PADS: 6.0000 | MEDICATED_PAD | Freq: Once | CUTANEOUS | Status: DC

## 2021-11-04 MED ORDER — ONDANSETRON HCL 4 MG/2ML IJ SOLN
4.0000 mg | Freq: Once | INTRAMUSCULAR | Status: AC
Start: 2021-11-04 — End: 2021-11-04
  Administered 2021-11-04: 4 mg via INTRAVENOUS
  Filled 2021-11-04: qty 2

## 2021-11-04 MED ORDER — BUPIVACAINE LIPOSOME 1.3 % IJ SUSP
20.0000 mL | Freq: Once | INTRAMUSCULAR | Status: DC
Start: 2021-11-04 — End: 2021-11-05

## 2021-11-04 MED ORDER — PHENYLEPHRINE HCL-NACL 20-0.9 MG/250ML-% IV SOLN
INTRAVENOUS | Status: DC | PRN
  Administered 2021-11-04: 40 ug/min via INTRAVENOUS

## 2021-11-04 MED ORDER — PHENYLEPHRINE HCL (PRESSORS) 10 MG/ML IV SOLN
INTRAVENOUS | Status: AC
  Filled 2021-11-04: qty 1

## 2021-11-04 MED ORDER — SUCCINYLCHOLINE CHLORIDE 200 MG/10ML IV SOSY
PREFILLED_SYRINGE | INTRAVENOUS | Status: DC | PRN
  Administered 2021-11-04: 100 mg via INTRAVENOUS

## 2021-11-04 MED ORDER — ROCURONIUM BROMIDE 10 MG/ML (PF) SYRINGE
PREFILLED_SYRINGE | INTRAVENOUS | Status: DC | PRN
  Administered 2021-11-04: 30 mg via INTRAVENOUS

## 2021-11-04 MED ORDER — LACTATED RINGERS IV SOLN
INTRAVENOUS | Status: AC | PRN
Start: 2021-11-04 — End: 2021-11-04
  Administered 2021-11-04: 1000 mL

## 2021-11-04 MED ORDER — FENTANYL CITRATE (PF) 250 MCG/5ML IJ SOLN
INTRAMUSCULAR | Status: AC
  Filled 2021-11-04: qty 5

## 2021-11-04 MED ORDER — PIPERACILLIN-TAZOBACTAM 3.375 G IVPB 30 MIN
3.3750 g | Freq: Once | INTRAVENOUS | Status: AC
  Administered 2021-11-04: 3.375 g via INTRAVENOUS
  Filled 2021-11-04: qty 50

## 2021-11-04 MED ORDER — LIDOCAINE HCL (CARDIAC) PF 100 MG/5ML IV SOSY
PREFILLED_SYRINGE | INTRAVENOUS | Status: DC | PRN
  Administered 2021-11-04: 100 mg via INTRAVENOUS

## 2021-11-04 MED ORDER — PROPOFOL 10 MG/ML IV BOLUS
INTRAVENOUS | Status: AC
  Filled 2021-11-04: qty 20

## 2021-11-04 MED ORDER — IOHEXOL 300 MG/ML  SOLN
100.0000 mL | Freq: Once | INTRAMUSCULAR | Status: AC | PRN
  Administered 2021-11-04: 100 mL via INTRAVENOUS

## 2021-11-04 MED ORDER — LACTATED RINGERS IV SOLN: INTRAVENOUS | Status: DC | PRN

## 2021-11-04 MED ORDER — LIDOCAINE HCL (PF) 2 % IJ SOLN
INTRAMUSCULAR | Status: AC
  Filled 2021-11-04: qty 5

## 2021-11-04 MED ORDER — PROTHROMBIN COMPLEX CONC HUMAN 500 UNITS IV KIT
1617.0000 [IU] | PACK | Status: AC
  Administered 2021-11-04: 1617 [IU] via INTRAVENOUS
  Filled 2021-11-04: qty 500

## 2021-11-04 MED ORDER — PROPOFOL 10 MG/ML IV BOLUS
INTRAVENOUS | Status: DC | PRN
  Administered 2021-11-04: 110 mg via INTRAVENOUS

## 2021-11-04 MED ORDER — DEXAMETHASONE SODIUM PHOSPHATE 10 MG/ML IJ SOLN
INTRAMUSCULAR | Status: DC | PRN
  Administered 2021-11-04: 5 mg via INTRAVENOUS

## 2021-11-04 MED ORDER — FENTANYL CITRATE (PF) 250 MCG/5ML IJ SOLN
INTRAMUSCULAR | Status: DC | PRN
  Administered 2021-11-04 – 2021-11-05 (×3): 50 ug via INTRAVENOUS

## 2021-11-04 MED ORDER — ACETAMINOPHEN 500 MG PO TABS
1000.0000 mg | ORAL_TABLET | ORAL | Status: AC
Start: 2021-11-05 — End: 2021-11-06

## 2021-11-04 MED ORDER — MORPHINE SULFATE (PF) 4 MG/ML IV SOLN
4.0000 mg | Freq: Once | INTRAVENOUS | Status: AC
  Administered 2021-11-04: 4 mg via INTRAVENOUS
  Filled 2021-11-04: qty 1

## 2021-11-04 MED ORDER — PHENYLEPHRINE 40 MCG/ML (10ML) SYRINGE FOR IV PUSH (FOR BLOOD PRESSURE SUPPORT)
PREFILLED_SYRINGE | INTRAVENOUS | Status: DC | PRN
  Administered 2021-11-04: 80 ug via INTRAVENOUS
  Administered 2021-11-04 (×2): 120 ug via INTRAVENOUS
  Administered 2021-11-04: 80 ug via INTRAVENOUS

## 2021-11-04 MED ORDER — SODIUM CHLORIDE 0.9 % IV BOLUS
1000.0000 mL | Freq: Once | INTRAVENOUS | Status: AC
  Administered 2021-11-04: 1000 mL via INTRAVENOUS

## 2021-11-04 MED ORDER — BUPIVACAINE LIPOSOME 1.3 % IJ SUSP
INTRAMUSCULAR | Status: AC
  Filled 2021-11-04: qty 20

## 2021-11-04 SURGICAL SUPPLY — 72 items
APL PRP STRL LF DISP 70% ISPRP (MISCELLANEOUS) ×2
BAG COUNTER SPONGE SURGICOUNT (BAG) IMPLANT
BAG SPNG CNTER NS LX DISP (BAG)
BLADE EXTENDED COATED 6.5IN (ELECTRODE) IMPLANT
BLADE HEX COATED 2.75 (ELECTRODE) IMPLANT
CABLE HIGH FREQUENCY MONO STRZ (ELECTRODE) ×3 IMPLANT
CHLORAPREP W/TINT 26 (MISCELLANEOUS) ×3 IMPLANT
COUNTER NEEDLE 20 DBL MAG RED (NEEDLE) ×3 IMPLANT
COVER MAYO STAND STRL (DRAPES) ×3 IMPLANT
COVER SURGICAL LIGHT HANDLE (MISCELLANEOUS) ×3 IMPLANT
DEVICE SECURE STRAP 25 ABSORB (INSTRUMENTS) ×4 IMPLANT
DRAIN CHANNEL 19F RND (DRAIN) IMPLANT
DRAPE LAPAROSCOPIC ABDOMINAL (DRAPES) ×3 IMPLANT
DRAPE SHEET LG 3/4 BI-LAMINATE (DRAPES) ×3 IMPLANT
DRAPE UTILITY XL STRL (DRAPES) ×6 IMPLANT
DRAPE WARM FLUID 44X44 (DRAPES) ×3 IMPLANT
DRSG OPSITE POSTOP 4X10 (GAUZE/BANDAGES/DRESSINGS) IMPLANT
DRSG OPSITE POSTOP 4X6 (GAUZE/BANDAGES/DRESSINGS) IMPLANT
DRSG OPSITE POSTOP 4X8 (GAUZE/BANDAGES/DRESSINGS) IMPLANT
DRSG TEGADERM 2-3/8X2-3/4 SM (GAUZE/BANDAGES/DRESSINGS) ×6 IMPLANT
DRSG TEGADERM 4X4.75 (GAUZE/BANDAGES/DRESSINGS) ×3 IMPLANT
ELECT REM PT RETURN 15FT ADLT (MISCELLANEOUS) ×3 IMPLANT
GAUZE SPONGE 2X2 8PLY STRL LF (GAUZE/BANDAGES/DRESSINGS) ×2 IMPLANT
GAUZE SPONGE 4X4 12PLY STRL (GAUZE/BANDAGES/DRESSINGS) ×3 IMPLANT
GLOVE SURG NEOPR MICRO LF SZ8 (GLOVE) ×6 IMPLANT
GLOVE SURG UNDER LTX SZ8 (GLOVE) ×6 IMPLANT
GOWN STRL REUS W/TWL XL LVL3 (GOWN DISPOSABLE) ×6 IMPLANT
HANDLE SUCTION POOLE (INSTRUMENTS) ×2 IMPLANT
IRRIG SUCT STRYKERFLOW 2 WTIP (MISCELLANEOUS) ×3
IRRIGATION SUCT STRKRFLW 2 WTP (MISCELLANEOUS) ×1 IMPLANT
KIT BASIN OR (CUSTOM PROCEDURE TRAY) ×3 IMPLANT
KIT TURNOVER KIT A (KITS) IMPLANT
LEGGING LITHOTOMY PAIR STRL (DRAPES) IMPLANT
MARKER SKIN DUAL TIP RULER LAB (MISCELLANEOUS) ×3 IMPLANT
MESH HERNIA 6X6 BARD (Mesh General) ×3 IMPLANT
MESH HERNIA BARD 6X6 (Mesh General) ×3 IMPLANT
NDL INSUFFLATION 14GA 120MM (NEEDLE) IMPLANT
NEEDLE INSUFFLATION 14GA 120MM (NEEDLE) IMPLANT
PACK GENERAL/GYN (CUSTOM PROCEDURE TRAY) ×3 IMPLANT
PAD POSITIONING PINK XL (MISCELLANEOUS) ×3 IMPLANT
SCISSORS LAP 5X35 DISP (ENDOMECHANICALS) ×3 IMPLANT
SET TUBE SMOKE EVAC HIGH FLOW (TUBING) ×3 IMPLANT
SLEEVE ADV FIXATION 5X100MM (TROCAR) ×3 IMPLANT
SPIKE FLUID TRANSFER (MISCELLANEOUS) ×3 IMPLANT
SPONGE GAUZE 2X2 STER 10/PKG (GAUZE/BANDAGES/DRESSINGS) ×1
STAPLER VISISTAT 35W (STAPLE) ×3 IMPLANT
SUCTION POOLE HANDLE (INSTRUMENTS) ×3
SUT MNCRL AB 4-0 PS2 18 (SUTURE) ×3 IMPLANT
SUT PDS AB 1 CT1 27 (SUTURE) ×6 IMPLANT
SUT PDS AB 1 CTX 36 (SUTURE) IMPLANT
SUT PDS AB 1 TP1 96 (SUTURE) IMPLANT
SUT PROLENE 2 0 SH DA (SUTURE) ×6 IMPLANT
SUT SILK 0 (SUTURE)
SUT SILK 0 30XBRD TIE 6 (SUTURE) IMPLANT
SUT SILK 2 0 (SUTURE) ×3
SUT SILK 2 0 SH CR/8 (SUTURE) ×3 IMPLANT
SUT SILK 2-0 18XBRD TIE 12 (SUTURE) ×2 IMPLANT
SUT SILK 3 0 (SUTURE) ×3
SUT SILK 3 0 SH CR/8 (SUTURE) ×3 IMPLANT
SUT SILK 3-0 18XBRD TIE 12 (SUTURE) ×2 IMPLANT
SUT VIC AB 2-0 SH 27 (SUTURE)
SUT VIC AB 2-0 SH 27X BRD (SUTURE) IMPLANT
SUT VICRYL 0 UR6 27IN ABS (SUTURE) ×3 IMPLANT
TACKER 5MM HERNIA 3.5CML NAB (ENDOMECHANICALS) IMPLANT
TAPE STRIPS DRAPE STRL (GAUZE/BANDAGES/DRESSINGS) ×2 IMPLANT
TAPE UMBILICAL 1/8 X36 TWILL (MISCELLANEOUS) ×3 IMPLANT
TOWEL OR 17X26 10 PK STRL BLUE (TOWEL DISPOSABLE) ×6 IMPLANT
TOWEL OR NON WOVEN STRL DISP B (DISPOSABLE) ×6 IMPLANT
TRAY FOLEY MTR SLVR 16FR STAT (SET/KITS/TRAYS/PACK) ×3 IMPLANT
TRAY LAPAROSCOPIC (CUSTOM PROCEDURE TRAY) ×3 IMPLANT
TROCAR ADV FIXATION 5X100MM (TROCAR) ×3 IMPLANT
TROCAR XCEL BLUNT TIP 100MML (ENDOMECHANICALS) ×3 IMPLANT

## 2021-11-04 NOTE — Consult Note (Addendum)
Hannah Downs  [birth year] [id number]  CARE TEAM:  PCP: [name], MD  Outpatient Care Team: Patient Care Team: [name], MD as PCP - General (Family Medicine) Nahser, Deloris Ping, MD as PCP - Cardiology (Cardiology)  Inpatient Treatment Team: Treatment Team: Attending Provider: Charlynne Pander, MD; Technician: Kela Millin, NT; Registered Nurse: Milly Jakob, RN; Consulting Physician: Montez Morita, Md, MD   This patient is a 76 y.o.female who presents today for surgical evaluation at the request of Dr Silverio Lay, [facility] ED.   Chief complaint / Reason for evaluation: Nausea vomiting with small bowel obstruction with transition zone and recurrent recurrent left inguinal hernia  76 year old former [place] Yorker who has required numerous surgeries for left inguinal hernia.  Required emergent reduction repair of an incarcerated hernia and then later follow-up open repair with mesh by Dr. Colin Benton in the 2000's.  She developed recurrence.  She was evaluated by Dr. Daphine Deutscher with our group for a large chronic inguinal hernia.  He attempted robotic reduction but was concerned with involvement of the bladder and possible vagina, so aborted repair and wished to hold off.  He recommended following with urogynecology to see if pelvic floor reconstruction could be done before reconsidering hernia repair.  Seen by him 4 days ago with this plan.  Patient notes worsening crampy abdominal pain.  Began to have nausea and vomiting today.  Chronic groin hernia sac more painful.  Patient and her daughter concerned.  Came to emergency room.  Obvious large hernia going down to left anterior thigh not reducible.  CT scan showing dilated small bowel loops getting and out of this concern for bowel obstruction.  Not reducible.  Urgent surgical consultation requested.  I was doing emergency surgery but came out to see her.  Patient is chronically anticoagulated on warfarin for atrial fibrillation.  She normally can  walk several miles without difficulty.  Usually moves her bowels once a day.  Occasionally irregular.  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  She had urinary tension issues and has had a Foley catheter since the last hernia attempt.  She is due to see urogynecology in March.   Assessment  Hannah Downs  76 y.o. female  Day of Surgery    Problem List:  Active Problems:   Atrial fibrillation (HCC)   Left inguinal hernia-multiple recurrence   Recurrent unilateral inguinal hernia with incarceration   SBO (small bowel obstruction) (HCC)   Chronic anticoagulation   Recurrent-recurrent left inguinal hernia chronically incarcerated but now causing complete bowel obstruction with nausea vomiting and reducible.  Possible femoral component as well.  Plan:  I think this requires emergent operative exploration.  Laparoscopic possible open approach.  Most likely will need to do a combination.  Agree with radiology there probably is a femoral component to this as well which means a laparoscopic approach would be helpful.  Dr. Daphine Deutscher had difficulty safely doing this so we will see how much we can do and may need to adjust and do a hybrid approach.  I do not see any colon involvement.  Looks like it is all small intestine.  We will have to get the small bowel reduced.  I cautioned that she is at risk for needing a bowel resection.  There is no frank gangrene or pneumatosis externally and her skin is very thin.  No evidence of perforation or necrosis or pneumatosis on the CAT scan but still  concerning.  I do not think this can wait.  I did caution complications are increased emergently.  If there is no evidence of any ischemia or necrosis or infection, we will try and use permanent mesh but most likely have to use a phasix type mesh.  Try and place it preperitoneal but may need a combination with an open approach.  Possible ostomy but hopefully not as  likely  The anatomy & physiology of the abdominal wall and pelvic floor was discussed.  The pathophysiology of hernias in the inguinal and pelvic region was discussed.  Natural history risks such as progressive enlargement, pain, incarceration, and strangulation was discussed.   Contributors to complications such as smoking, obesity, diabetes, prior surgery, etc were discussed.    I feel the risks of no intervention will lead to serious problems that outweigh the operative risks; therefore, I recommended surgery to reduce and repair the hernia.  I explained laparoscopic techniques with possible need for an open approach.  I noted usual use of mesh to patch and/or buttress hernia repair  Risks such as bleeding, infection, abscess, need for further treatment, injury to other organs, need for repair of tissues / organs, stroke, heart attack, death, and other risks were discussed.  I noted a good likelihood this will help address the problem.   Goals of post-operative recovery were discussed as well.  Possibility that this will not correct all symptoms was explained.  I stressed the importance of low-impact activity, aggressive pain control, avoiding constipation, & not pushing through pain to minimize risk of post-operative chronic pain or injury. Possibility of reherniation was discussed.  We will work to minimize complications.     An educational handout further explaining the pathology & treatment options was given as well.  Questions were answered.  The patient expresses understanding & wishes to proceed with surgery.  She is fully anticoagulated warfarin with INR 2.7.  This cannot wait.  We will try aggressive reversal protocol as possible.  Dr. Silverio Lay has Emergency Department ordering FFP as well.  Updated anesthesia team.  She came back COVID-positive.  We will do precautions.  Medicine consultation. Dr Cyndia Bent with [facility] to see postop    VTE prophylaxis- SCDs, etc  mobilize as tolerated to help  recovery  35 minutes spent in review, evaluation, examination, counseling, and coordination of care.  More than 50% of that time was spent in counseling.  Ardeth Sportsman, MD, FACS, MASCRS Esophageal, Gastrointestinal & Colorectal Surgery Robotic and Minimally Invasive Surgery  [facility], [facility]   [facility]  [address], [address] [place], [place] [address] [phone] Fax [phone] Main  CONTACT INFORMATION:  Weekday (9AM-5PM): Call [facility] main office at [phone]  Weeknight (5PM-9AM) or Weekend/Holiday: Check [email or web address] (password " [email or web address]") for General Surgery [facility] coverage  (Please, do not use SecureChat as it is not reliable communication to operating surgeons for immediate patient care)      11/04/2021      Past Medical History:  Diagnosis Date   Arthritis    Dysrhythmia    afib   GERD (gastroesophageal reflux disease)    Hypercholesterolemia    Hypertension    Lumbar degenerative disc disease    Miscarriage    times 3   Osteopenia    Seasonal allergies    Varicose vein    right leg   Vitamin D deficiency     Past Surgical History:  Procedure Laterality Date   DILATION AND  CURETTAGE OF UTERUS     hx of approx 26 years ago   HERNIA REPAIR     hx of in left groin times 2   TOTAL HIP ARTHROPLASTY Right 06/26/2014   Procedure: RIGHT TOTAL HIP ARTHROPLASTY ANTERIOR APPROACH WITH ACETABULAR AUTOGRAFT;  Surgeon: Loanne Drilling, MD;  Location: [facility];  Service: Orthopedics;  Laterality: Right;    Social History   Socioeconomic History   Marital status: Married    Spouse name: Not on file   Number of children: Not on file   Years of education: Not on file   Highest education level: Not on file  Occupational History   Not on file  Tobacco Use   Smoking status: Never   Smokeless tobacco: Never  Vaping Use   Vaping Use: Never used  Substance and Sexual Activity   Alcohol use: No   Drug use: No    Sexual activity: Yes    Birth control/protection: Patch  Other Topics Concern   Not on file  Social History Narrative   Not on file   Social Determinants of Health   Financial Resource Strain: Not on file  Food Insecurity: Not on file  Transportation Needs: Not on file  Physical Activity: Not on file  Stress: Not on file  Social Connections: Not on file  Intimate Partner Violence: Not on file    Family History  Problem Relation Age of Onset   Cancer Mother    Atrial fibrillation Father    Stroke Father     Current Facility-Administered Medications  Medication Dose Route Frequency Provider Last Rate Last Admin   [START ON 11/05/2021] acetaminophen (TYLENOL) tablet 1,000 mg  1,000 mg Oral On Call to OR Karie Soda, MD       bupivacaine liposome (EXPAREL) 1.3 % injection 266 mg  20 mL Infiltration Once Karie Soda, MD       Chlorhexidine Gluconate Cloth 2 % PADS 6 each  6 each Topical Once Rylee Nuzum, Viviann Spare, MD       clindamycin (CLEOCIN) IVPB 900 mg  900 mg Intravenous On Call to OR Karie Soda, MD       And   gentamicin (GARAMYCIN) 260 mg in dextrose 5 % 100 mL IVPB  5 mg/kg Intravenous On Call to OR Karie Soda, MD       phytonadione (VITAMIN K) 10 mg in dextrose 5 % 50 mL IVPB  10 mg Intravenous STAT Charlynne Pander, MD 150 mL/hr at 11/04/21 2047 10 mg at 11/04/21 2047   prothrombin complex conc human (KCENTRA) IVPB 1,617 Units  1,617 Units Intravenous STAT Charlynne Pander, MD       Current Outpatient Medications  Medication Sig Dispense Refill   acetaminophen (TYLENOL) 500 MG tablet Take 1,000 mg by mouth every 6 (six) hours as needed for moderate pain.     atorvastatin (LIPITOR) 20 MG tablet Take 1 tablet by mouth once daily (Patient taking differently: Take 20 mg by mouth daily.) 90 tablet 0   Cholecalciferol (VITAMIN D3) 50 MCG (2000 UT) TABS Take 2,000 Units by mouth in the morning.     hydrochlorothiazide (HYDRODIURIL) 25 MG tablet Take 1 tablet by mouth  once daily (Patient taking differently: Take 25 mg by mouth daily.) 90 tablet 0   metoprolol succinate (TOPROL-XL) 100 MG 24 hr tablet Take 1 tablet (100 mg total) by mouth daily. Pt. Need to make appt. With Cardiologist in order to receive further refills. Thank You. 1st Attempt. (Patient taking differently:  Take 150 mg by mouth at bedtime.) 45 tablet 0   warfarin (COUMADIN) 5 MG tablet TAKE 1 TABLET BY MOUTH ONCE DAILY AS  DIRECTED  BY  COUMADIN  CLINIC (Patient taking differently: Take 5 mg by mouth daily.) 35 tablet 1   HYDROcodone-acetaminophen (NORCO/VICODIN) 5-325 MG tablet Take 1 tablet by mouth every 6 (six) hours as needed for moderate pain. (Patient not taking: Reported on 11/04/2021) 15 tablet 0     Allergies  Allergen Reactions   Lisinopril Cough    ROS:   All other systems reviewed & are negative except per HPI or as noted below: Constitutional:  No fevers, chills, sweats.  Weight stable Eyes:  No vision changes, No discharge HENT:  No sore throats, nasal drainage Lymph: No neck swelling, No bruising easily Pulmonary:  No cough, productive sputum CV: No orthopnea, PND  Patient walks 60 minutes for about 2 miles without difficulty.  No exertional chest/neck/shoulder/arm pain. GI: No personal nor family history of GI/colon cancer, inflammatory bowel disease, irritable bowel syndrome, allergy such as Celiac Sprue, dietary/dairy problems, colitis, ulcers nor gastritis.  No recent sick contacts/gastroenteritis.  No travel outside the country.  No changes in diet. Renal: No UTIs, No hematuria Genital:  No drainage, bleeding, masses Musculoskeletal: No severe joint pain.  Good ROM major joints Skin:  No sores or lesions.  No rashes Heme/Lymph:  No easy bleeding.  No swollen lymph nodes Neuro: No focal weakness/numbness.  No seizures Psych: No suicidal ideation.  No hallucinations  BP (!) 123/94    Pulse 95    Temp (!) 97.4 F (36.3 C)    Resp 16    Ht 5\' 1"  (1.549 m)    Wt 52.6 kg     SpO2 93%    BMI 21.92 kg/m   Physical Exam:  Constitutional: Not cachectic.  Hygeine adequate.  Vitals signs as above.   Eyes: Pupils reactive, normal extraocular movements. Sclera nonicteric Neuro: CN II-XII intact.  No major focal sensory defects.  No major motor deficits. Lymph: No head/neck/groin lymphadenopathy Psych:  No severe agitation.  No severe anxiety.  Judgment & insight Adequate, Oriented x4, HENT: Normocephalic, Mucus membranes moist.  No thrush.   Neck: Supple, No tracheal deviation.  No obvious thyromegaly Chest: No pain to chest wall compression.  Good respiratory excursion.  No audible wheezing CV:  Pulses intact.   Regular rhythm.  No major extremity edema  Abdomen:  Flat Hernia: Not present. Diastasis recti: Not present. Soft.   Nondistended.  Nontender.  No hepatomegaly.  No splenomegaly  Gen:  Inguinal hernia: Large 10 x 8 cm thin-walled subcutaneous mass going from left groin onto left thigh consistent with hernia.  I can feel a smaller neck but I cannot get anything to reduce down.  There is some bluish tinge but no evidence of any gangrene or edema or necrosis.  She is uncomfortable but not in severe peritonitis type pain.  Inguinal lymph nodes: Mildly enlarged lymph nodes right greater than left but physiologic.  She has a Foley catheter in place with clear colorless urine.  Her vagina has tissue prolapsed out consistent with significant vaginal/uterine prolapse  Rectal: (Deferred)  Ext: No obvious deformity or contracture.  Edema: Not present.  No cyanosis Skin: No major subcutaneous nodules.  Warm and dry Musculoskeletal: Severe joint rigidity not present.  No obvious clubbing.  No digital petechiae.     Results:   Labs: Results for orders placed or performed during the hospital encounter  of 11/04/21 (from the past 48 hour(s))  CBC with Differential/Platelet     Status: Abnormal   Collection Time: 11/04/21  5:39 PM  Result Value Ref Range   WBC 12.9  (H) 4.0 - 10.5 K/uL   RBC 5.03 3.87 - 5.11 MIL/uL   Hemoglobin 15.2 (H) 12.0 - 15.0 g/dL   HCT 16.1 09.6 - 04.5 %   MCV 88.7 80.0 - 100.0 fL   MCH 30.2 26.0 - 34.0 pg   MCHC 34.1 30.0 - 36.0 g/dL   RDW 40.9 81.1 - 91.4 %   Platelets 341 150 - 400 K/uL   nRBC 0.0 0.0 - 0.2 %   Neutrophils Relative % 89 %   Neutro Abs 11.3 (H) 1.7 - 7.7 K/uL   Lymphocytes Relative 8 %   Lymphs Abs 1.0 0.7 - 4.0 K/uL   Monocytes Relative 3 %   Monocytes Absolute 0.4 0.1 - 1.0 K/uL   Eosinophils Relative 0 %   Eosinophils Absolute 0.0 0.0 - 0.5 K/uL   Basophils Relative 0 %   Basophils Absolute 0.1 0.0 - 0.1 K/uL   Immature Granulocytes 0 %   Abs Immature Granulocytes 0.04 0.00 - 0.07 K/uL    Comment: Performed at [facility], [address], [place], [place] [address]  Lactic acid, plasma     Status: None   Collection Time: 11/04/21  5:39 PM  Result Value Ref Range   Lactic Acid, Venous 1.3 0.5 - 1.9 mmol/L    Comment: Performed at [facility], [address] [place], [place] [address]  Protime-INR     Status: Abnormal   Collection Time: 11/04/21  5:39 PM  Result Value Ref Range   Prothrombin Time 28.8 (H) 11.4 - 15.2 seconds   INR 2.7 (H) 0.8 - 1.2    Comment: (NOTE) INR goal varies based on device and disease states. Performed at [facility], [address] [place], [place] [address]   I-stat chem 8, ED (not at [facility] or [facility])     Status: Abnormal   Collection Time: 11/04/21  5:46 PM  Result Value Ref Range   Sodium 130 (L) 135 - 145 mmol/L   Potassium 3.8 3.5 - 5.1 mmol/L   Chloride 94 (L) 98 - 111 mmol/L   BUN 16 8 - 23 mg/dL   Creatinine, Ser 4.69 0.44 - 1.00 mg/dL   Glucose, Bld 629 (H) 70 - 99 mg/dL    Comment: Glucose reference range applies only to samples taken after fasting for at least 8 hours.   Calcium, Ion 1.02 (L) 1.15 - 1.40 mmol/L   TCO2 28 22 - 32 mmol/L   Hemoglobin 16.0 (H) 12.0 - 15.0 g/dL   HCT 52.8 (H)  41.3 - 46.0 %  Urinalysis, Routine w reflex microscopic     Status: Abnormal   Collection Time: 11/04/21  6:47 PM  Result Value Ref Range   Color, Urine YELLOW (A) YELLOW   APPearance CLEAR (A) CLEAR   Specific Gravity, Urine <1.005 (L) 1.005 - 1.030   pH 6.5 5.0 - 8.0   Glucose, UA NEGATIVE NEGATIVE mg/dL   Hgb urine dipstick SMALL (A) NEGATIVE   Bilirubin Urine NEGATIVE NEGATIVE   Ketones, ur 15 (A) NEGATIVE mg/dL   Protein, ur NEGATIVE NEGATIVE mg/dL   Nitrite NEGATIVE NEGATIVE   Leukocytes,Ua MODERATE (A) NEGATIVE   RBC / HPF 0-5 0 - 5 RBC/hpf   WBC, UA 21-50 0 - 5 WBC/hpf  Bacteria, UA RARE (A) NONE SEEN    Comment: Performed at [facility], [address], [place], [place] [address]  Resp Panel by RT-PCR (Flu A&B, Covid)     Status: Abnormal   Collection Time: 11/04/21  6:51 PM   Specimen: Nasopharyngeal(NP) swabs in vial transport medium  Result Value Ref Range   SARS Coronavirus 2 by RT PCR POSITIVE (A) NEGATIVE    Comment: (NOTE) SARS-CoV-2 target nucleic acids are DETECTED.  The SARS-CoV-2 RNA is generally detectable in upper respiratory specimens during the acute phase of infection. Positive results are indicative of the presence of the identified virus, but do not rule out bacterial infection or co-infection with other pathogens not detected by the test. Clinical correlation with patient history and other diagnostic information is necessary to determine patient infection status. The expected result is Negative.  Fact Sheet for Patients: BloggerCourse.com  Fact Sheet for Healthcare Providers: SeriousBroker.it  This test is not yet approved or cleared by the [place] FDA and  has been authorized for detection and/or diagnosis of SARS-CoV-2 by FDA under an Emergency Use Authorization (EUA).  This EUA will remain in effect (meaning this test can be used) for the duration of  the COVID-19  declaration under Section 564(b)(1) of the A ct, 21 U.S.C. section 360bbb-3(b)(1), unless the authorization is terminated or revoked sooner.     Influenza A by PCR NEGATIVE NEGATIVE   Influenza B by PCR NEGATIVE NEGATIVE    Comment: (NOTE) The Xpert Xpress SARS-CoV-2/FLU/RSV plus assay is intended as an aid in the diagnosis of influenza from Nasopharyngeal swab specimens and should not be used as a sole basis for treatment. Nasal washings and aspirates are unacceptable for Xpert Xpress SARS-CoV-2/FLU/RSV testing.  Fact Sheet for Patients: BloggerCourse.com  Fact Sheet for Healthcare Providers: SeriousBroker.it  This test is not yet approved or cleared by the [place] FDA and has been authorized for detection and/or diagnosis of SARS-CoV-2 by FDA under an Emergency Use Authorization (EUA). This EUA will remain in effect (meaning this test can be used) for the duration of the COVID-19 declaration under Section 564(b)(1) of the Act, 21 U.S.C. section 360bbb-3(b)(1), unless the authorization is terminated or revoked.  Performed at [facility], [address], [place], [place] [address]     Imaging / Studies: CT ABDOMEN PELVIS W CONTRAST  Result Date: 11/04/2021 CLINICAL DATA: Abdominal pain with inguinal hernia. EXAM: CT ABDOMEN AND PELVIS WITH CONTRAST TECHNIQUE: Multidetector CT imaging of the abdomen and pelvis was performed using the standard protocol following bolus administration of intravenous contrast. RADIATION DOSE REDUCTION: This exam was performed according to the departmental dose-optimization program which includes automated exposure control, adjustment of the mA and/or kV according to patient size and/or use of iterative reconstruction technique. CONTRAST:  OMNIPAQUE IOHEXOL 300 MG/ML  SOLN COMPARISON:  Comparison is made with 03/26/2021. FINDINGS: Lower chest: Lung bases are clear. No  effusion, no consolidative changes. Hepatobiliary: Liver with smooth contours. The portal vein is patent. No focal, suspicious hepatic lesion. No pericholecystic stranding. No substantial biliary duct distension. Pancreas: Signs of pancreatic atrophy without signs of adjacent inflammation. Spleen: Normal. Adrenals/Urinary Tract: Adrenal glands are unremarkable. Symmetric renal enhancement. No sign of hydronephrosis. No suspicious renal lesion or perinephric stranding. Urinary bladder is grossly unremarkable. Stomach/Bowel: Small hiatal hernia. Large periampullary duodenal diverticulum. Marked small bowel distension with herniation of multiple loops of bowel at different levels into a large LEFT groin hernia. There hernia contains small  bowel loops which show obstruction within the hernia sac and substantial fluid within the hernia sac. Stranding in the mesentery. Aperture of the hernia is approximately 4.3 cm as before. The hernia compresses the LEFT femoral vein and passes lateral and inferior to the pubic tubercle. Upstream loops of bowel are markedly dilated in the abdomen. There is mesenteric distortion without frank signs of mesenteric twist. Small amounts of interloop fluid are developing in the abdomen. Within the abdomen bowel loops also show a closed loop type configuration with several in bound and out bound loops of bowel showing varying degrees of dilation. Most dilated loop of bowel at approximately 3.6 cm. Upstream loops are less dilated than the intra-abdominal closed loop which is located in the LEFT lower quadrant. Upstream proximal ileal and or jejunal loops can be tracked back to collapsed bowel in the upper abdomen though the transition is more gradual than the transition of the intra-abdominal closed loop obstruction. Vascular/Lymphatic: Calcified atheromatous plaque of the abdominal aorta tracking in the iliac vessels. There is no gastrohepatic or hepatoduodenal ligament lymphadenopathy. No  retroperitoneal or mesenteric lymphadenopathy. No pelvic sidewall lymphadenopathy. Compression of mesenteric veins can be seen abruptly at the level of the hernia sac with marked change in caliber. Reproductive: Pelvic organ prolapse, complex 3 compartment prolapse of pelvic viscera worse in the anterior middle compartments with inversion of the uterus. Urinary bladder is well below the symphysis pubis within prolapsed organs and the Foley catheter passes inferiorly from the urethral orifice into the urinary bladder. No Angla Delahunt adnexal mass. Streak artifact limits assessment of pelvic viscera including bowel loops in the pelvis. Other: No frank ascites within the abdomen but with signs of mesenteric edema and developing interloop fluid. Also with large amount of fluid in the hernia sac adjacent to dilated loops of bowel with signs of mesenteric edema. No pneumoperitoneum or pneumatosis at this time. Musculoskeletal: Spinal degenerative changes. No acute or destructive bone process. RIGHT hip arthroplasty with streak artifact that limits assessment of pelvic structures. IMPRESSION: 1. LEFT groin hernia with features that favor femoral hernia over inguinal hernia but with signs of closed loop obstruction both within the abdomen and within the hernia sac with multiple loops of bowel which are herniated into the hernia at multiple levels, involving proximal ileal or distal jejunal loops as well as distal and mid ileal loops with multiple sites of transition. 2. Based on appearance this is highly suspicious for incarcerated hernia. Surgical consultation is advised. 3. No current signs of pneumatosis but with developing interloop fluid and with moderately large amount of fluid in the hernia sac which is suspicious for at risk bowel in the setting of closed loop obstruction. 4. Compression of mesenteric veins further raises concern for potential for developing bowel ischemia. Bowel continues to enhance on the current exam.  5. Signs of complex pelvic organ prolapse as before. Aortic Atherosclerosis (ICD10-I70.0). Critical Value/emergent results were called by telephone at the time of interpretation on 11/04/2021 at 6:59 pm to provider DAVID YAO , who verbally acknowledged these results. Electronically Signed   By: Donzetta Kohut M.D.   On: 11/04/2021 19:01    Medications / Allergies: per chart  Antibiotics: Anti-infectives (From admission, onward)    Start     Dose/Rate Route Frequency Ordered Stop   11/04/21 2130  clindamycin (CLEOCIN) IVPB 900 mg       See Hyperspace for full Linked Orders Report.   900 mg 100 mL/hr over 30 Minutes Intravenous On call to O.R.  11/04/21 2027 11/05/21 0559   11/04/21 2115  gentamicin (GARAMYCIN) 260 mg in dextrose 5 % 100 mL IVPB       See Hyperspace for full Linked Orders Report.   5 mg/kg  52.6 kg 106.5 mL/hr over 60 Minutes Intravenous On call to O.R. 11/04/21 2027 11/05/21 0559   11/04/21 1945  piperacillin-tazobactam (ZOSYN) IVPB 3.375 g        3.375 g 100 mL/hr over 30 Minutes Intravenous  Once 11/04/21 1940 11/04/21 2050         Note: Portions of this report may have been transcribed using voice recognition software. Every effort was made to ensure accuracy; however, inadvertent computerized transcription errors may be present.   Any transcriptional errors that result from this process are unintentional.    Ardeth Sportsman, MD, FACS, MASCRS Esophageal, Gastrointestinal & Colorectal Surgery Robotic and Minimally Invasive Surgery  [facility], [facility]   [facility]  [address], [address] [place], [place] [address] [phone] Fax [phone] Main  CONTACT INFORMATION:  Weekday (9AM-5PM): Call [facility] main office at [phone]  Weeknight (5PM-9AM) or Weekend/Holiday: Check [email or web address] (password " [email or web address]") for General Surgery [facility] coverage  (Please, do not use SecureChat as it is not reliable communication to  operating surgeons for immediate patient care)       11/04/2021  8:53 PM

## 2021-11-04 NOTE — Anesthesia Preprocedure Evaluation (Addendum)
Anesthesia Evaluation  Patient identified by MRN, date of birth, ID band Patient awake    Reviewed: Allergy & Precautions, NPO status , Patient's Chart, lab work & pertinent test results  Airway Mallampati: II  TM Distance: >3 FB Neck ROM: Full    Dental no notable dental hx.    Pulmonary neg pulmonary ROS,    Pulmonary exam normal breath sounds clear to auscultation       Cardiovascular hypertension, Normal cardiovascular exam+ dysrhythmias Atrial Fibrillation  Rhythm:Regular Rate:Normal     Neuro/Psych negative neurological ROS  negative psych ROS   GI/Hepatic negative GI ROS, Neg liver ROS,   Endo/Other  negative endocrine ROS  Renal/GU negative Renal ROS  negative genitourinary   Musculoskeletal negative musculoskeletal ROS (+)   Abdominal   Peds negative pediatric ROS (+)  Hematology negative hematology ROS (+)   Anesthesia Other Findings   Reproductive/Obstetrics negative OB ROS                             Anesthesia Physical Anesthesia Plan  ASA: 3 and emergent  Anesthesia Plan: General   Post-op Pain Management: Dilaudid IV   Induction: Intravenous and Rapid sequence  PONV Risk Score and Plan: 3 and Ondansetron, Dexamethasone, Droperidol and Treatment may vary due to age or medical condition  Airway Management Planned: Oral ETT  Additional Equipment:   Intra-op Plan:   Post-operative Plan: Extubation in OR  Informed Consent: I have reviewed the patients History and Physical, chart, labs and discussed the procedure including the risks, benefits and alternatives for the proposed anesthesia with the patient or authorized representative who has indicated his/her understanding and acceptance.     Dental advisory given  Plan Discussed with: CRNA and Surgeon  Anesthesia Plan Comments: (INR 2.7 Being reversed with K Centra and FFP)       Anesthesia Quick  Evaluation

## 2021-11-04 NOTE — Anesthesia Procedure Notes (Signed)
Procedure Name: Intubation Date/Time: 11/04/2021 10:10 PM Performed by: Raenette Rover, CRNA Pre-anesthesia Checklist: Patient identified, Emergency Drugs available, Suction available and Patient being monitored Patient Re-evaluated:Patient Re-evaluated prior to induction Oxygen Delivery Method: Circle system utilized Preoxygenation: Pre-oxygenation with 100% oxygen Induction Type: IV induction, Cricoid Pressure applied and Rapid sequence Laryngoscope Size: Mac and 3 Grade View: Grade I Tube type: Oral Tube size: 7.0 mm Number of attempts: 1 Airway Equipment and Method: Stylet Placement Confirmation: ETT inserted through vocal cords under direct vision, positive ETCO2 and breath sounds checked- equal and bilateral Secured at: 21 cm Tube secured with: Tape Dental Injury: Teeth and Oropharynx as per pre-operative assessment

## 2021-11-04 NOTE — ED Provider Notes (Signed)
[facility]-EMERGENCY DEPT Provider Note   CSN: [id number] Arrival date & time: 11/04/21  1659     History  Chief Complaint  Patient presents with   Hernia    Hannah Downs is a 76 y.o. female here presenting with hernia.  Patient has recurrent inguinal hernia.  Patient had 3 surgeries already.  The last surgery was a month ago by Dr. Daphine DeutscherMartin.  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  Patient states that for the last 3 to 4 days, the hernia site is more painful.  She states that she is now vomiting.  Patient denies any fevers.  Patient states that she was supposed to see urogynecology in 3 weeks.  The history is provided by the patient.      Home Medications Prior to Admission medications   Medication Sig Start Date End Date Taking? Authorizing Provider  acetaminophen (TYLENOL) 500 MG tablet Take 1,000 mg by mouth at bedtime.    [provider]  atorvastatin (LIPITOR) 20 MG tablet Take 1 tablet by mouth once daily 09/08/21   Nahser, Deloris PingPhilip J, MD  Cholecalciferol (VITAMIN D3) 50 MCG (2000 UT) TABS Take 2,000 Units by mouth in the morning.    [provider]  hydrochlorothiazide (HYDRODIURIL) 25 MG tablet Take 1 tablet by mouth once daily 09/08/21   Nahser, Deloris PingPhilip J, MD  HYDROcodone-acetaminophen (NORCO/VICODIN) 5-325 MG tablet Take 1 tablet by mouth every 6 (six) hours as needed for moderate pain. 09/18/21   Luretha MurphyMartin, Matthew, MD  metoprolol succinate (TOPROL-XL) 100 MG 24 hr tablet Take 1 tablet (100 mg total) by mouth daily. Pt. Need to make appt. With Cardiologist in order to receive further refills. Thank You. 1st Attempt. 10/27/21   Nahser, Deloris PingPhilip J, MD  warfarin (COUMADIN) 5 MG tablet TAKE 1 TABLET BY MOUTH ONCE DAILY AS  DIRECTED  BY  COUMADIN  CLINIC 09/08/21   Nahser, Deloris PingPhilip J, MD      Allergies    Lisinopril    Review of Systems   Review of Systems  Gastrointestinal:  Positive for abdominal pain and  vomiting.  All other systems reviewed and are negative.  Physical Exam Updated Vital Signs BP 130/82    Pulse 82    Temp (!) 97.4 F (36.3 C)    Resp 16    Ht 5\' 1"  (1.549 m)    Wt 52.6 kg    SpO2 99%    BMI 21.92 kg/m  Physical Exam Vitals and nursing note reviewed.  HENT:     Head: Normocephalic.     Nose: Nose normal.     Mouth/Throat:     Mouth: Mucous membranes are moist.  Eyes:     Extraocular Movements: Extraocular movements intact.     Pupils: Pupils are equal, round, and reactive to light.  Cardiovascular:     Rate and Rhythm: Normal rate and regular rhythm.  Pulmonary:     Effort: Pulmonary effort is normal.     Breath sounds: Normal breath sounds.  Abdominal:     Comments: Distended, patient has left inguinal hernia that is very hard and not reducible  Musculoskeletal:        General: Normal range of motion.     Cervical back: Normal range of motion and neck supple.  Skin:    General: Skin is warm.     Capillary Refill: Capillary refill takes less than 2 seconds.  Neurological:     General: No focal deficit  present.     Mental Status: She is alert and oriented to person, place, and time.  Psychiatric:        Mood and Affect: Mood normal.        Behavior: Behavior normal.    ED Results / Procedures / Treatments   Labs (all labs ordered are listed, but only abnormal results are displayed) Labs Reviewed  CBC WITH DIFFERENTIAL/PLATELET - Abnormal; Notable for the following components:      Result Value   WBC 12.9 (*)    Hemoglobin 15.2 (*)    Neutro Abs 11.3 (*)    All other components within normal limits  URINALYSIS, ROUTINE W REFLEX MICROSCOPIC - Abnormal; Notable for the following components:   Color, Urine YELLOW (*)    APPearance CLEAR (*)    Specific Gravity, Urine <1.005 (*)    Hgb urine dipstick SMALL (*)    Ketones, ur 15 (*)    Leukocytes,Ua MODERATE (*)    Bacteria, UA RARE (*)    All other components within normal limits  PROTIME-INR -  Abnormal; Notable for the following components:   Prothrombin Time 28.8 (*)    INR 2.7 (*)    All other components within normal limits  I-STAT CHEM 8, ED - Abnormal; Notable for the following components:   Sodium 130 (*)    Chloride 94 (*)    Glucose, Bld 131 (*)    Calcium, Ion 1.02 (*)    Hemoglobin 16.0 (*)    HCT 47.0 (*)    All other components within normal limits  CULTURE, BLOOD (ROUTINE X 2)  CULTURE, BLOOD (ROUTINE X 2)  URINE CULTURE  RESP PANEL BY RT-PCR (FLU A&B, COVID) ARPGX2  LACTIC ACID, PLASMA  BASIC METABOLIC PANEL  LACTIC ACID, PLASMA    EKG None  Radiology CT ABDOMEN PELVIS W CONTRAST  Result Date: 11/04/2021 CLINICAL DATA: Abdominal pain with inguinal hernia. EXAM: CT ABDOMEN AND PELVIS WITH CONTRAST TECHNIQUE: Multidetector CT imaging of the abdomen and pelvis was performed using the standard protocol following bolus administration of intravenous contrast. RADIATION DOSE REDUCTION: This exam was performed according to the departmental dose-optimization program which includes automated exposure control, adjustment of the mA and/or kV according to patient size and/or use of iterative reconstruction technique. CONTRAST:  OMNIPAQUE IOHEXOL 300 MG/ML  SOLN COMPARISON:  Comparison is made with 03/26/2021. FINDINGS: Lower chest: Lung bases are clear. No effusion, no consolidative changes. Hepatobiliary: Liver with smooth contours. The portal vein is patent. No focal, suspicious hepatic lesion. No pericholecystic stranding. No substantial biliary duct distension. Pancreas: Signs of pancreatic atrophy without signs of adjacent inflammation. Spleen: Normal. Adrenals/Urinary Tract: Adrenal glands are unremarkable. Symmetric renal enhancement. No sign of hydronephrosis. No suspicious renal lesion or perinephric stranding. Urinary bladder is grossly unremarkable. Stomach/Bowel: Small hiatal hernia. Large periampullary duodenal diverticulum. Marked small bowel distension with  herniation of multiple loops of bowel at different levels into a large LEFT groin hernia. There hernia contains small bowel loops which show obstruction within the hernia sac and substantial fluid within the hernia sac. Stranding in the mesentery. Aperture of the hernia is approximately 4.3 cm as before. The hernia compresses the LEFT femoral vein and passes lateral and inferior to the pubic tubercle. Upstream loops of bowel are markedly dilated in the abdomen. There is mesenteric distortion without frank signs of mesenteric twist. Small amounts of interloop fluid are developing in the abdomen. Within the abdomen bowel loops also show a closed loop type configuration  with several in bound and out bound loops of bowel showing varying degrees of dilation. Most dilated loop of bowel at approximately 3.6 cm. Upstream loops are less dilated than the intra-abdominal closed loop which is located in the LEFT lower quadrant. Upstream proximal ileal and or jejunal loops can be tracked back to collapsed bowel in the upper abdomen though the transition is more gradual than the transition of the intra-abdominal closed loop obstruction. Vascular/Lymphatic: Calcified atheromatous plaque of the abdominal aorta tracking in the iliac vessels. There is no gastrohepatic or hepatoduodenal ligament lymphadenopathy. No retroperitoneal or mesenteric lymphadenopathy. No pelvic sidewall lymphadenopathy. Compression of mesenteric veins can be seen abruptly at the level of the hernia sac with marked change in caliber. Reproductive: Pelvic organ prolapse, complex 3 compartment prolapse of pelvic viscera worse in the anterior middle compartments with inversion of the uterus. Urinary bladder is well below the symphysis pubis within prolapsed organs and the Foley catheter passes inferiorly from the urethral orifice into the urinary bladder. No gross adnexal mass. Streak artifact limits assessment of pelvic viscera including bowel loops in the  pelvis. Other: No frank ascites within the abdomen but with signs of mesenteric edema and developing interloop fluid. Also with large amount of fluid in the hernia sac adjacent to dilated loops of bowel with signs of mesenteric edema. No pneumoperitoneum or pneumatosis at this time. Musculoskeletal: Spinal degenerative changes. No acute or destructive bone process. RIGHT hip arthroplasty with streak artifact that limits assessment of pelvic structures. IMPRESSION: 1. LEFT groin hernia with features that favor femoral hernia over inguinal hernia but with signs of closed loop obstruction both within the abdomen and within the hernia sac with multiple loops of bowel which are herniated into the hernia at multiple levels, involving proximal ileal or distal jejunal loops as well as distal and mid ileal loops with multiple sites of transition. 2. Based on appearance this is highly suspicious for incarcerated hernia. Surgical consultation is advised. 3. No current signs of pneumatosis but with developing interloop fluid and with moderately large amount of fluid in the hernia sac which is suspicious for at risk bowel in the setting of closed loop obstruction. 4. Compression of mesenteric veins further raises concern for potential for developing bowel ischemia. Bowel continues to enhance on the current exam. 5. Signs of complex pelvic organ prolapse as before. Aortic Atherosclerosis (ICD10-I70.0). Critical Value/emergent results were called by telephone at the time of interpretation on 11/04/2021 at 6:59 pm to provider Harveen Flesch , who verbally acknowledged these results. Electronically Signed   By: Donzetta Kohut M.D.   On: 11/04/2021 19:01    Procedures Procedures    CRITICAL CARE Performed by: Richardean Canal   Total critical care time: 30 minutes  Critical care time was exclusive of separately billable procedures and treating other patients.  Critical care was necessary to treat or prevent imminent or  life-threatening deterioration.  Critical care was time spent personally by me on the following activities: development of treatment plan with patient and/or surrogate as well as nursing, discussions with consultants, evaluation of patient's response to treatment, examination of patient, obtaining history from patient or surrogate, ordering and performing treatments and interventions, ordering and review of laboratory studies, ordering and review of radiographic studies, pulse oximetry and re-evaluation of patient's condition.   Medications Ordered in ED Medications  piperacillin-tazobactam (ZOSYN) IVPB 3.375 g (has no administration in time range)  morphine (PF) 4 MG/ML injection 4 mg (4 mg Intravenous Given 11/04/21 1745)  ondansetron [facility]) injection 4 mg (4 mg Intravenous Given 11/04/21 1745)  sodium chloride 0.9 % bolus 1,000 mL (1,000 mLs Intravenous New Bag/Given 11/04/21 1745)  iohexol (OMNIPAQUE) 300 MG/ML solution 100 mL (100 mLs Intravenous Contrast Given 11/04/21 1814)    ED Course/ Medical Decision Making/ A&P                           Medical Decision Making Tiffaney E Hirose is a 76 y.o. female here presenting with left inguinal hernia and now has vomiting and severe pain.  Patient had multiple hernia repairs that failed.  Patient states that now she has vomiting.  Concern for possible bowel obstruction or incarcerated hernia.  Plan to get CBC and CMP and lactate and CT abdomen pelvis  7 pm Patient CT showed a closed-loop obstruction.  I consulted Dr. Michaell Cowing from surgery.  He states that he is in surgery right now.  He recommend NG tube and he will see the patient after he gets out of surgery.  8:15 PM Nurse was unable to get NG tube in. Dr. Michaell Cowing at beside to see patient.  He will take patient emergently to the OR.  Request that I order reversal for Coumadin.  I ordered FFP and vitamin K and Kcentra.  Surgery to admit   Problems Addressed: Inguinal hernia of left side with  obstruction: acute illness or injury  Amount and/or Complexity of Data Reviewed External Data Reviewed: notes. Labs: ordered. Decision-making details documented in ED Course. Radiology: ordered and independent interpretation performed. Decision-making details documented in ED Course. ECG/medicine tests: independent interpretation performed. Decision-making details documented in ED Course.  Risk Prescription drug management. Decision regarding hospitalization. Emergency major surgery.  Final Clinical Impression(s) / ED Diagnoses Final diagnoses:  Inguinal hernia of left side with obstruction    Rx / DC Orders ED Discharge Orders     None         Charlynne Pander, MD 11/04/21 2036

## 2021-11-04 NOTE — ED Notes (Signed)
Attempted to place NG X2 without success. Doctor Silverio Lay made aware and was willing to attempt, but surgeon is ready for patient in the OR pending Coumadin reversal, so NG will be placed during surgery per conversation in the ER with surgeon.

## 2021-11-04 NOTE — ED Triage Notes (Signed)
Pt states she has had a hernia for years on her lower abdominal area. Today states the pain is different and a lot worse.

## 2021-11-04 NOTE — Progress Notes (Signed)
A consult was received from an ED physician for piperacillin/tazobactam per pharmacy dosing.  The patient's profile has been reviewed for ht/wt/allergies/indication/available labs.    A one time order has been placed for piperacillin/tazobactam 3.375 g IV once to be infused over 30 mins.    Further antibiotics/pharmacy consults should be ordered by admitting physician if indicated.                       Thank you, Cindi Carbon, PharmD 11/04/2021  7:41 PM

## 2021-11-05 ENCOUNTER — Other Ambulatory Visit: Payer: Self-pay

## 2021-11-05 ENCOUNTER — Encounter (HOSPITAL_COMMUNITY): Payer: Self-pay

## 2021-11-05 DIAGNOSIS — E871 Hypo-osmolality and hyponatremia: Secondary | ICD-10-CM

## 2021-11-05 DIAGNOSIS — K4030 Unilateral inguinal hernia, with obstruction, without gangrene, not specified as recurrent: Secondary | ICD-10-CM

## 2021-11-05 DIAGNOSIS — I480 Paroxysmal atrial fibrillation: Secondary | ICD-10-CM | POA: Diagnosis not present

## 2021-11-05 DIAGNOSIS — K4130 Unilateral femoral hernia, with obstruction, without gangrene, not specified as recurrent: Secondary | ICD-10-CM | POA: Diagnosis present

## 2021-11-05 DIAGNOSIS — K4131 Unilateral femoral hernia, with obstruction, without gangrene, recurrent: Secondary | ICD-10-CM

## 2021-11-05 DIAGNOSIS — U071 COVID-19: Secondary | ICD-10-CM | POA: Diagnosis not present

## 2021-11-05 DIAGNOSIS — K4190 Unilateral femoral hernia, without obstruction or gangrene, not specified as recurrent: Secondary | ICD-10-CM

## 2021-11-05 DIAGNOSIS — D72829 Elevated white blood cell count, unspecified: Secondary | ICD-10-CM

## 2021-11-05 LAB — CBC
HCT: 35.8 % — ABNORMAL LOW (ref 36.0–46.0)
HCT: 34.9 % — ABNORMAL LOW (ref 36.0–46.0)
Hemoglobin: 12.1 g/dL (ref 12.0–15.0)
Hemoglobin: 11.7 g/dL — ABNORMAL LOW (ref 12.0–15.0)
MCH: 30.8 pg (ref 26.0–34.0)
MCH: 30.6 pg (ref 26.0–34.0)
MCHC: 33.8 g/dL (ref 30.0–36.0)
MCHC: 33.5 g/dL (ref 30.0–36.0)
MCV: 91.1 fL (ref 80.0–100.0)
MCV: 91.4 fL (ref 80.0–100.0)
Platelets: 295 10*3/uL (ref 150–400)
Platelets: 309 10*3/uL (ref 150–400)
RBC: 3.93 MIL/uL (ref 3.87–5.11)
RBC: 3.82 MIL/uL — ABNORMAL LOW (ref 3.87–5.11)
RDW: 13.1 % (ref 11.5–15.5)
RDW: 13.5 % (ref 11.5–15.5)
WBC: 17.5 10*3/uL — ABNORMAL HIGH (ref 4.0–10.5)
WBC: 14 10*3/uL — ABNORMAL HIGH (ref 4.0–10.5)
nRBC: 0 % (ref 0.0–0.2)
nRBC: 0 % (ref 0.0–0.2)

## 2021-11-05 LAB — PROTIME-INR
INR: 1.2 (ref 0.8–1.2)
Prothrombin Time: 15.2 seconds (ref 11.4–15.2)

## 2021-11-05 LAB — BASIC METABOLIC PANEL
Anion gap: 8 (ref 5–15)
BUN: 14 mg/dL (ref 8–23)
CO2: 24 mmol/L (ref 22–32)
Calcium: 8.5 mg/dL — ABNORMAL LOW (ref 8.9–10.3)
Chloride: 98 mmol/L (ref 98–111)
Creatinine, Ser: 0.6 mg/dL (ref 0.44–1.00)
GFR, Estimated: >60 mL/min (ref >60)
Glucose, Bld: 163 mg/dL — ABNORMAL HIGH (ref 70–99)
Potassium: 3.6 mmol/L (ref 3.5–5.1)
Sodium: 130 mmol/L — ABNORMAL LOW (ref 135–145)

## 2021-11-05 LAB — HEMOGLOBIN AND HEMATOCRIT, BLOOD
HCT: 34.9 % — ABNORMAL LOW (ref 36.0–46.0)
Hemoglobin: 11.7 g/dL — ABNORMAL LOW (ref 12.0–15.0)

## 2021-11-05 LAB — MAGNESIUM: Magnesium: 1.5 mg/dL — ABNORMAL LOW (ref 1.7–2.4)

## 2021-11-05 MED ORDER — PROCHLORPERAZINE EDISYLATE 10 MG/2ML IJ SOLN: 5.0000 mg | Freq: Four times a day (QID) | INTRAMUSCULAR | Status: DC | PRN

## 2021-11-05 MED ORDER — GABAPENTIN 100 MG PO CAPS
100.0000 mg | ORAL_CAPSULE | Freq: Two times a day (BID) | ORAL | Status: DC
  Administered 2021-11-05 – 2021-11-09 (×10): 100 mg via ORAL
  Filled 2021-11-05 (×10): qty 1

## 2021-11-05 MED ORDER — ONDANSETRON HCL 4 MG/2ML IJ SOLN
INTRAMUSCULAR | Status: DC | PRN
  Administered 2021-11-05: 4 mg via INTRAVENOUS

## 2021-11-05 MED ORDER — CEFAZOLIN SODIUM-DEXTROSE 2-4 GM/100ML-% IV SOLN
2.0000 g | Freq: Three times a day (TID) | INTRAVENOUS | Status: AC
  Administered 2021-11-05 (×2): 2 g via INTRAVENOUS
  Filled 2021-11-05 (×2): qty 100

## 2021-11-05 MED ORDER — DIPHENHYDRAMINE HCL 12.5 MG/5ML PO ELIX
12.5000 mg | ORAL_SOLUTION | Freq: Four times a day (QID) | ORAL | Status: DC | PRN
  Filled 2021-11-05: qty 5

## 2021-11-05 MED ORDER — ROCURONIUM BROMIDE 10 MG/ML (PF) SYRINGE
PREFILLED_SYRINGE | INTRAVENOUS | Status: AC
  Filled 2021-11-05: qty 10

## 2021-11-05 MED ORDER — LIP MEDEX EX OINT
1.0000 "application " | TOPICAL_OINTMENT | Freq: Two times a day (BID) | CUTANEOUS | Status: DC
  Administered 2021-11-05 – 2021-11-09 (×7): 1 via TOPICAL
  Filled 2021-11-05 (×3): qty 7

## 2021-11-05 MED ORDER — BISACODYL 10 MG RE SUPP
10.0000 mg | Freq: Every day | RECTAL | Status: DC
Start: 2021-11-05 — End: 2021-11-09
  Filled 2021-11-05 (×3): qty 1

## 2021-11-05 MED ORDER — MAGIC MOUTHWASH
15.0000 mL | Freq: Four times a day (QID) | ORAL | Status: DC | PRN
Start: 2021-11-05 — End: 2021-11-09
  Filled 2021-11-05: qty 15

## 2021-11-05 MED ORDER — MAGNESIUM HYDROXIDE 400 MG/5ML PO SUSP: 30.0000 mL | Freq: Every day | ORAL | Status: DC | PRN

## 2021-11-05 MED ORDER — PROCHLORPERAZINE MALEATE 10 MG PO TABS
10.0000 mg | ORAL_TABLET | Freq: Four times a day (QID) | ORAL | Status: DC | PRN
  Filled 2021-11-05: qty 1

## 2021-11-05 MED ORDER — METHOCARBAMOL 500 MG PO TABS: 500.0000 mg | ORAL_TABLET | Freq: Four times a day (QID) | ORAL | Status: DC | PRN

## 2021-11-05 MED ORDER — SUCCINYLCHOLINE CHLORIDE 200 MG/10ML IV SOSY
PREFILLED_SYRINGE | INTRAVENOUS | Status: AC
  Filled 2021-11-05: qty 10

## 2021-11-05 MED ORDER — HYDROMORPHONE HCL 1 MG/ML IJ SOLN: 0.2500 mg | INTRAMUSCULAR | Status: DC | PRN

## 2021-11-05 MED ORDER — ONDANSETRON 4 MG PO TBDP: 4.0000 mg | ORAL_TABLET | Freq: Four times a day (QID) | ORAL | Status: DC | PRN

## 2021-11-05 MED ORDER — ACETAMINOPHEN 10 MG/ML IV SOLN: 1000.0000 mg | Freq: Once | INTRAVENOUS | Status: DC | PRN

## 2021-11-05 MED ORDER — LIDOCAINE HCL (PF) 2 % IJ SOLN
INTRAMUSCULAR | Status: AC
  Filled 2021-11-05: qty 5

## 2021-11-05 MED ORDER — DEXAMETHASONE SODIUM PHOSPHATE 10 MG/ML IJ SOLN
INTRAMUSCULAR | Status: AC
  Filled 2021-11-05: qty 1

## 2021-11-05 MED ORDER — PHENOL 1.4 % MT LIQD
2.0000 | OROMUCOSAL | Status: DC | PRN
Start: 2021-11-05 — End: 2021-11-09

## 2021-11-05 MED ORDER — BISACODYL 10 MG RE SUPP: 10.0000 mg | Freq: Every day | RECTAL | Status: DC | PRN

## 2021-11-05 MED ORDER — ALUM & MAG HYDROXIDE-SIMETH 200-200-20 MG/5ML PO SUSP: 30.0000 mL | Freq: Four times a day (QID) | ORAL | Status: DC | PRN

## 2021-11-05 MED ORDER — BUPIVACAINE LIPOSOME 1.3 % IJ SUSP
INTRAMUSCULAR | Status: DC | PRN
Start: 2021-11-05 — End: 2021-11-05
  Administered 2021-11-05: 20 mL

## 2021-11-05 MED ORDER — ACETAMINOPHEN 500 MG PO TABS
1000.0000 mg | ORAL_TABLET | Freq: Four times a day (QID) | ORAL | Status: DC
  Administered 2021-11-05 – 2021-11-09 (×16): 1000 mg via ORAL
  Filled 2021-11-05 (×17): qty 2

## 2021-11-05 MED ORDER — METOPROLOL TARTRATE 5 MG/5ML IV SOLN: 2.5000 mg | INTRAVENOUS | Status: AC

## 2021-11-05 MED ORDER — MENTHOL 3 MG MT LOZG
1.0000 | LOZENGE | OROMUCOSAL | Status: DC | PRN
Start: 2021-11-05 — End: 2021-11-09
  Filled 2021-11-05: qty 9

## 2021-11-05 MED ORDER — LACTATED RINGERS IV BOLUS: 1000.0000 mL | Freq: Three times a day (TID) | INTRAVENOUS | Status: DC | PRN

## 2021-11-05 MED ORDER — ATORVASTATIN CALCIUM 20 MG PO TABS
20.0000 mg | ORAL_TABLET | Freq: Every day | ORAL | Status: DC
Start: 2021-11-05 — End: 2021-11-07
  Administered 2021-11-05 – 2021-11-07 (×3): 20 mg via ORAL
  Filled 2021-11-05 (×3): qty 1

## 2021-11-05 MED ORDER — ESMOLOL HCL 100 MG/10ML IV SOLN
INTRAVENOUS | Status: AC
  Filled 2021-11-05: qty 10

## 2021-11-05 MED ORDER — OXYCODONE HCL 5 MG PO TABS: 5.0000 mg | ORAL_TABLET | ORAL | Status: DC | PRN

## 2021-11-05 MED ORDER — POTASSIUM CHLORIDE 20 MEQ PO PACK
40.0000 meq | PACK | Freq: Once | ORAL | Status: AC
  Administered 2021-11-05: 40 meq via ORAL
  Filled 2021-11-05: qty 2

## 2021-11-05 MED ORDER — DROPERIDOL 2.5 MG/ML IJ SOLN
INTRAMUSCULAR | Status: DC | PRN
  Administered 2021-11-05: .625 mg via INTRAVENOUS

## 2021-11-05 MED ORDER — MAGNESIUM SULFATE 4 GM/100ML IV SOLN
4.0000 g | Freq: Once | INTRAVENOUS | Status: AC
  Administered 2021-11-05: 4 g via INTRAVENOUS
  Filled 2021-11-05: qty 100

## 2021-11-05 MED ORDER — TRAMADOL HCL 50 MG PO TABS
50.0000 mg | ORAL_TABLET | Freq: Four times a day (QID) | ORAL | Status: DC | PRN
  Administered 2021-11-05 – 2021-11-06 (×2): 50 mg via ORAL
  Administered 2021-11-07: 100 mg via ORAL
  Administered 2021-11-07 (×2): 50 mg via ORAL
  Filled 2021-11-05 (×3): qty 1
  Filled 2021-11-05: qty 2
  Filled 2021-11-05: qty 1
  Filled 2021-11-05: qty 2

## 2021-11-05 MED ORDER — PHENYLEPHRINE 40 MCG/ML (10ML) SYRINGE FOR IV PUSH (FOR BLOOD PRESSURE SUPPORT)
PREFILLED_SYRINGE | INTRAVENOUS | Status: AC
  Filled 2021-11-05: qty 20

## 2021-11-05 MED ORDER — VITAMIN D 25 MCG (1000 UNIT) PO TABS
2000.0000 [IU] | ORAL_TABLET | Freq: Every day | ORAL | Status: DC
  Administered 2021-11-05 – 2021-11-09 (×5): 2000 [IU] via ORAL
  Filled 2021-11-05 (×6): qty 2

## 2021-11-05 MED ORDER — BUPIVACAINE-EPINEPHRINE 0.25% -1:200000 IJ SOLN
INTRAMUSCULAR | Status: DC | PRN
Start: 2021-11-05 — End: 2021-11-05
  Administered 2021-11-05: 60 mL

## 2021-11-05 MED ORDER — METOPROLOL TARTRATE 5 MG/5ML IV SOLN: 5.0000 mg | Freq: Four times a day (QID) | INTRAVENOUS | Status: DC | PRN

## 2021-11-05 MED ORDER — LACTATED RINGERS IV SOLN: INTRAVENOUS | Status: AC

## 2021-11-05 MED ORDER — METHOCARBAMOL 1000 MG/10ML IJ SOLN
1000.0000 mg | Freq: Four times a day (QID) | INTRAMUSCULAR | Status: DC | PRN
  Filled 2021-11-05: qty 10

## 2021-11-05 MED ORDER — ONDANSETRON HCL 4 MG/2ML IJ SOLN
INTRAMUSCULAR | Status: AC
  Filled 2021-11-05: qty 2

## 2021-11-05 MED ORDER — ONDANSETRON HCL 4 MG/2ML IJ SOLN: 4.0000 mg | Freq: Once | INTRAMUSCULAR | Status: DC | PRN

## 2021-11-05 MED ORDER — CALCIUM POLYCARBOPHIL 625 MG PO TABS
625.0000 mg | ORAL_TABLET | Freq: Two times a day (BID) | ORAL | Status: DC
  Administered 2021-11-05 – 2021-11-07 (×4): 625 mg via ORAL
  Filled 2021-11-05 (×11): qty 1

## 2021-11-05 MED ORDER — HYDROMORPHONE HCL 1 MG/ML IJ SOLN: 0.5000 mg | INTRAMUSCULAR | Status: DC | PRN

## 2021-11-05 MED ORDER — ONDANSETRON HCL 4 MG/2ML IJ SOLN: 4.0000 mg | Freq: Four times a day (QID) | INTRAMUSCULAR | Status: DC | PRN

## 2021-11-05 MED ORDER — METOPROLOL TARTRATE 5 MG/5ML IV SOLN
2.5000 mg | INTRAVENOUS | Status: DC | PRN
  Administered 2021-11-08 – 2021-11-09 (×2): 2.5 mg via INTRAVENOUS
  Filled 2021-11-05 (×2): qty 5

## 2021-11-05 MED ORDER — DILTIAZEM HCL-DEXTROSE 125-5 MG/125ML-% IV SOLN (PREMIX)
5.0000 mg/h | INTRAVENOUS | Status: DC
  Filled 2021-11-05: qty 125

## 2021-11-05 MED ORDER — ESMOLOL HCL 100 MG/10ML IV SOLN
INTRAVENOUS | Status: DC | PRN
  Administered 2021-11-05: 30 mg via INTRAVENOUS

## 2021-11-05 MED ORDER — SIMETHICONE 80 MG PO CHEW
40.0000 mg | CHEWABLE_TABLET | Freq: Four times a day (QID) | ORAL | Status: DC | PRN
  Administered 2021-11-08 – 2021-11-09 (×3): 40 mg via ORAL
  Filled 2021-11-05 (×5): qty 1

## 2021-11-05 MED ORDER — SUGAMMADEX SODIUM 200 MG/2ML IV SOLN
INTRAVENOUS | Status: DC | PRN
  Administered 2021-11-05: 110 mg via INTRAVENOUS

## 2021-11-05 MED ORDER — METOPROLOL TARTRATE 25 MG PO TABS
12.5000 mg | ORAL_TABLET | Freq: Two times a day (BID) | ORAL | Status: DC
  Administered 2021-11-07: 12.5 mg via ORAL
  Filled 2021-11-05 (×2): qty 1

## 2021-11-05 MED ORDER — METOPROLOL TARTRATE 25 MG PO TABS
25.0000 mg | ORAL_TABLET | Freq: Two times a day (BID) | ORAL | Status: DC
  Administered 2021-11-05: 25 mg via ORAL
  Filled 2021-11-05 (×2): qty 1

## 2021-11-05 MED ORDER — ENOXAPARIN SODIUM 40 MG/0.4ML IJ SOSY: 40.0000 mg | PREFILLED_SYRINGE | INTRAMUSCULAR | Status: DC

## 2021-11-05 MED ORDER — DIPHENHYDRAMINE HCL 50 MG/ML IJ SOLN: 12.5000 mg | Freq: Four times a day (QID) | INTRAMUSCULAR | Status: DC | PRN

## 2021-11-05 NOTE — Assessment & Plan Note (Signed)
statin

## 2021-11-05 NOTE — Hospital Course (Addendum)
Hannah Downs is a 76 y.o. female with past medical history of paroxysmal atrial fibrillation on Coumadin and left inguinal hernia who presents to the emergency department with lower abdominal pain, nausea, and vomiting.  She was found to have a recurrent left femoral hernia incarcerated with small bowel and causing obstruction.  She was given vitamin k and k centra prior to surgery.  She's now s/p surgery on 2/23.    Her hospital course was complicated by atrial fibrillation with RVR.  The hospitalist service was consulted to help with her RVR.  She's now converted back to sinus rhythm, but on 2/24 she developed right sided facial numbness and right arm numbness.  She told the nurse on 2/25 AM and she had a head CT which was negative.  A subsequent MRI showed a stroke.  She's being transferred to [facility] for stroke evaluation on 2/25.

## 2021-11-05 NOTE — Assessment & Plan Note (Signed)
Presented with L femoral hernia which was incarcerated with small bowel and causing obstruction Now s/p laparoscopic lysis of adhesions, reduction of incarcerated recurrent left femoral hernia, laparoscopic repair of incarcerated recurrent left femoral hernia, laparascopic repiar of right femoral hernia, laparoscopic repair of right obturator hernia, TAP block bilateral Post op course per surgery

## 2021-11-05 NOTE — Assessment & Plan Note (Addendum)
Mild.  With low urine sodium and exam, suspect hypovolemic Continue IVF and follow HCTZ could exacerbate, hence hold it on discharge.  Sodium has improved to 133.

## 2021-11-05 NOTE — Consult Note (Signed)
Initial Consultation Note   Patient: Hannah Downs [id number]:[id number] DOB: 06/22/1946 PCP: [name], MD DOA: 11/04/2021 DOS: the patient was seen and examined on 11/05/2021 Primary service: Montez Morita Md, MD  Referring physician: Dr. Michaell Cowing, surgery  Reason for consult: rapid atrial fibrillation, COVID+   Assessment/Plan: Assessment and Plan:  1. Incarcerated left femoral hernia; bowel obstruction - S/p emergent laparoscopic reduction and repair early am of 2/23     2. COVID+  - She had aches, fever, fatigue, and cough last month and was found to be positive for COVID then, did not require treatment, and fully recovered within a few days  - She continues to test positive but is more than 20 days out from initial positive test, has fully recovered, and no longer requires isolation   3. Rapid atrial fibrillation  - Pt with PAF on warfarin was in SR in ED but went into rapid a fib in OR  - She remains in atrial fibrillation when seen on [address] [place] but rate now 70s  - INR was 2.7 in ED and she was treated with 10 mg IV vit K and Kcentra  - Repeat INR, resume anticoagulation when okay with surgery to do so, continue metoprolol   4. Hyponatremia  - Serum sodium was 129 in ED in setting of hypovolemia  - She was given a liter of NS in ED  - Continue IVF hydration and repeat chem panel     [facility] will continue to follow the patient.  HPI: Hannah Downs is a 76 y.o. female with past medical history of paroxysmal atrial fibrillation on Coumadin and left inguinal hernia who presents to the emergency department with lower abdominal pain, nausea, and vomiting.  Patient has been having some increased pain at the site of her left inguinal hernia over the past 3 to 4 days, developed significant worsening over the past 24 hours, has had recurrent nausea and vomiting since early a.m. of 11/04/2021, and eventually comes to the ED with severe pain.  She reports having a few days of fatigue, aches,  fever, and nonproductive cough in January, tested positive for COVID-19 at that time, did not require any treatment, and made a complete recovery.  In the ED, she was found to have sodium of 129, WBC 12,900, INR 2.7, and positive COVID PCR.  CT was concerning for left groin hernia with signs of closed-loop obstruction.  Surgery was consulted by the ED physician, patient was given IV vitamin K and Kcentra, and surgery took the patient to the OR emergently.  She went into rapid atrial fibrillation in the OR.   Review of Systems: As mentioned in the history of present illness. All other systems reviewed and are negative. Past Medical History:  Diagnosis Date   Arthritis    Dysrhythmia    afib   GERD (gastroesophageal reflux disease)    Hypercholesterolemia    Hypertension    Lumbar degenerative disc disease    Miscarriage    times 3   Osteopenia    Seasonal allergies    Varicose vein    right leg   Vitamin D deficiency    Past Surgical History:  Procedure Laterality Date   DILATION AND CURETTAGE OF UTERUS  1998   hx of approx 26 years ago   FEMORAL HERNIA REPAIR  11/02/2006   Emergent reduction/primary repair.  Dr Grier Mitts   INGUINAL HERNIA REPAIR  02/03/2007   open w mesh.  Dr Grier Mitts   ROBOTIC ASSISTED  DIAGNOSTIC LAPAROSCOPY  09/18/2021   robotic reduction/partial takedown of recurrent inguinal hernia   TOTAL HIP ARTHROPLASTY Right 06/26/2014   Procedure: RIGHT TOTAL HIP ARTHROPLASTY ANTERIOR APPROACH WITH ACETABULAR AUTOGRAFT;  Surgeon: Loanne Drilling, MD;  Location: [facility];  Service: Orthopedics;  Laterality: Right;   Social History:  reports that she has never smoked. She has never used smokeless tobacco. She reports that she does not drink alcohol and does not use drugs.  Allergies  Allergen Reactions   Lisinopril Cough    Family History  Problem Relation Age of Onset   Cancer Mother    Atrial fibrillation Father    Stroke Father     Prior to Admission  medications   Medication Sig Start Date End Date Taking? Authorizing Provider  acetaminophen (TYLENOL) 500 MG tablet Take 1,000 mg by mouth every 6 (six) hours as needed for moderate pain.   Yes [provider]  atorvastatin (LIPITOR) 20 MG tablet Take 1 tablet by mouth once daily Patient taking differently: Take 20 mg by mouth daily. 09/08/21  Yes Nahser, Deloris Ping, MD  Cholecalciferol (VITAMIN D3) 50 MCG (2000 UT) TABS Take 2,000 Units by mouth in the morning.   Yes [provider]  hydrochlorothiazide (HYDRODIURIL) 25 MG tablet Take 1 tablet by mouth once daily Patient taking differently: Take 25 mg by mouth daily. 09/08/21  Yes Nahser, Deloris Ping, MD  metoprolol succinate (TOPROL-XL) 100 MG 24 hr tablet Take 1 tablet (100 mg total) by mouth daily. Pt. Need to make appt. With Cardiologist in order to receive further refills. Thank You. 1st Attempt. Patient taking differently: Take 150 mg by mouth at bedtime. 10/27/21  Yes Nahser, Deloris Ping, MD  warfarin (COUMADIN) 5 MG tablet TAKE 1 TABLET BY MOUTH ONCE DAILY AS  DIRECTED  BY  COUMADIN  CLINIC Patient taking differently: Take 5 mg by mouth daily. 09/08/21  Yes Nahser, Deloris Ping, MD  HYDROcodone-acetaminophen (NORCO/VICODIN) 5-325 MG tablet Take 1 tablet by mouth every 6 (six) hours as needed for moderate pain. Patient not taking: Reported on 11/04/2021 09/18/21   Luretha Murphy, MD    Physical Exam: Vitals:   11/05/21 0115 11/05/21 0130 11/05/21 0200 11/05/21 0201  BP: 118/78 117/78  (!) 97/57  Pulse: 80 94 (!) 113 88  Resp: 18 15  17   Temp:  97.6 F (36.4 C)  98.3 F (36.8 C)  TempSrc:    Oral  SpO2: 100% 100% 99% 100%  Weight:      Height:        Constitutional: NAD, calm  Eyes: PERTLA, lids and conjunctivae normal ENMT: Mucous membranes are moist. Posterior pharynx clear of any exudate or lesions.   Neck: supple, no masses  Respiratory: no wheezing, no crackles. No accessory muscle use.  Cardiovascular: irregularly  irregular, rate ~70. No extremity edema.   Abdomen: Soft. Appropriately tender.   Musculoskeletal: no clubbing / cyanosis. No joint deformity upper and lower extremities.   Skin: no significant rashes, lesions, ulcers. Warm, dry, well-perfused. Neurologic: CN 2-12 grossly intact. Moving all extremities. Alert and oriented.  Psychiatric: Pleasant. Cooperative.    Data Reviewed:  CT with left groin hernia, closed loop obstruction. Labs notable for hyponatremia. COVID pcr positive.    Family Communication: Daughter at bedside  Thank you very much for involving [place] in the care of your patient.  Author: Briscoe Deutscher, MD 11/05/2021 2:24 AM  For on call review [email or web address].

## 2021-11-05 NOTE — Progress Notes (Signed)
Consult NOTE    Hannah Downs  VWU:[id number] DOB: 23-Jun-1946 DOA: 11/04/2021 PCP: [name], MD  Chief Complaint  Patient presents with   Hernia    Brief Narrative:   Hannah Downs is Hannah Downs 76 y.o. female with past medical history of paroxysmal atrial fibrillation on Coumadin and left inguinal hernia who presents to the emergency department with lower abdominal pain, nausea, and vomiting.  She was found to have Hannah Downs recurrent left femoral hernia incarcerated with small bowel and causing obstruction.  She's now s/p surgery on 2/23.    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 11/05/2021   Assessment and Plan: * Femoral hernia of left side Presented with L femoral hernia which was incarcerated with small bowel and causing obstruction Now s/p laparoscopic lysis of adhesions, reduction of incarcerated recurrent left femoral hernia, laparoscopic repair of incarcerated recurrent left femoral hernia, laparascopic repiar of right femoral hernia, laparoscopic repair of right obturator hernia, TAP block bilateral Post op course per surgery  Paroxysmal atrial fibrillation with RVR (HCC)- (present on admission) Sinus at presentation, developed RVR in OR Continue metop, rate improved at this time S/p 10 mg vit K and K centra in ED Warfarin currently on hold.  Holding anticoagulation per surgery.  Will resume once ok per surgery.  COVID-19  virus detected- (present on admission) She reports testing positive on Hannah Downs home test on January 11th She's asymptomatic, without any new symptoms Isolation can be discontinued given within 90 days of recent infection   Hyponatremia- (present on admission) Could be related to HCTZ Mild, follow  Essential hypertension- (present on admission) Hold HCTZ for now Continue metop  Hypomagnesemia Replace and follow  Leukocytosis Related to above, follow  Hyperlipidemia- (present on admission) statin   DVT prophylaxis: SCD Code Status: ful Family Communication: none  Disposition: per surgery   Consultants:  General surgery is primary service [facility] is primary  Procedures:  2/23 Laparoscopic lysis of adhesions  Reduction of incarcerated recurrent left femoral hernia  Laparoscopic repair of incarcerated recurrent left femoral hernia   Laparoscopic repair of right femoral hernia  Laparoscopic repair of right obturator hernia TRANSVERSUS ABDOMINIS PLANE (TAP) BLOCK - BILATERAL  Antimicrobials:  Anti-infectives (From admission, onward)    Start     Dose/Rate Route Frequency Ordered Stop   11/05/21 0600  ceFAZolin (ANCEF) IVPB 2g/100 mL premix        2 g 200 mL/hr over 30 Minutes Intravenous Every 8 hours 11/05/21 0247 11/05/21 1421   11/04/21 2130  clindamycin (CLEOCIN) IVPB 900 mg       See Hyperspace for full Linked Orders Report.   900 mg 100 mL/hr over 30 Minutes Intravenous On call to O.R. 11/04/21 2027 11/04/21 2237   11/04/21 2115  gentamicin (GARAMYCIN) 260 mg in dextrose 5 % 100 mL IVPB       See Hyperspace for full Linked Orders Report.   5 mg/kg  52.6 kg 106.5  mL/hr over 60 Minutes Intravenous On call to O.R. 11/04/21 2027 11/04/21 2216   11/04/21 1945  piperacillin-tazobactam (ZOSYN) IVPB 3.375 g        3.375 g 100 mL/hr over 30 Minutes Intravenous  Once 11/04/21 1940 11/04/21 2050       Subjective: No new complaints Pain ok  Objective: Vitals:    11/05/21 0511 11/05/21 0925 11/05/21 1330 11/05/21 1524  BP: 95/79 96/67 (!) 85/59 (!) 86/62  Pulse: 60 80 (!) 106 (!) 51  Resp: 18 16 18 18   Temp: 98.1 F (36.7 C) 98.4 F (36.9 C) 98.3 F (36.8 C) 98.5 F (36.9 C)  TempSrc: Oral Oral Oral Oral  SpO2: 96% 100% 99% 98%  Weight:      Height:        Intake/Output Summary (Last 24 hours) at 11/05/2021 1546 Last data filed at 11/05/2021 1546 Gross per 24 hour  Intake 3357.33 ml  Output 1600 ml  Net 1757.33 ml   Filed Weights   11/04/21 1709  Weight: 52.6 kg    Examination:  General exam: Appears calm and comfortable  Respiratory system: unlabored Cardiovascular system: RRR Gastrointestinal system: L groin distension, appropriately TTP Central nervous system: Alert and oriented. No focal neurological deficits. Extremities: no LEE    Data Reviewed: I have personally reviewed following labs and imaging studies  CBC: Recent Labs  Lab 11/04/21 1739 11/04/21 1746 11/05/21 0430 11/05/21 1341  WBC 12.9*  --  17.5* 14.0*  NEUTROABS 11.3*  --   --   --   HGB 15.2* 16.0* 12.1 11.7*  HCT 44.6 47.0* 35.8* 34.9*  MCV 88.7  --  91.1 91.4  PLT 341  --  295 309    Basic Metabolic Panel: Recent Labs  Lab 11/04/21 1739 11/04/21 1746 11/05/21 0430  NA 129* 130* 130*  K 3.7 3.8 3.6  CL 93* 94* 98  CO2 24  --  24  GLUCOSE 129* 131* 163*  BUN 18 16 14   CREATININE 0.64 0.60 0.60  CALCIUM 10.0  --  8.5*  MG  --   --  1.5*    GFR: Estimated Creatinine Clearance: 45.1 mL/min (by C-G formula based on SCr of 0.6 mg/dL).  Liver Function Tests: No results for input(s): AST, ALT, ALKPHOS, BILITOT, PROT, ALBUMIN in the last 168 hours.  CBG: No results for input(s): GLUCAP in the last 168 hours.   Recent Results (from the past 240 hour(s))  Blood culture (routine x 2)     Status: None (Preliminary result)   Collection Time: 11/04/21  5:39 PM   Specimen: BLOOD  Result Value Ref Range Status   Specimen Description   Final     BLOOD LEFT ANTECUBITAL Performed at [facility], [address], [place], [place] [address]    Special Requests   Final    BOTTLES DRAWN AEROBIC AND ANAEROBIC Blood Culture adequate volume Performed at [facility], [address] [place], [place] [address]    Culture   Final    NO GROWTH < 12 HOURS Performed at [facility], [address], [place], [place] [address]    Report Status PENDING  Incomplete  Resp Panel by RT-PCR (Flu Darol Cush&B, Covid)     Status: Abnormal   Collection Time: 11/04/21  6:51 PM   Specimen: Nasopharyngeal(NP) swabs in vial transport medium  Result Value Ref Range Status   SARS Coronavirus 2 by RT PCR POSITIVE (Addisson Frate) NEGATIVE Final  Comment: (NOTE) SARS-CoV-2 target nucleic acids are DETECTED.  The SARS-CoV-2 RNA is generally detectable in upper respiratory specimens during the acute phase of infection. Positive results are indicative of the presence of the identified virus, but do not rule out bacterial infection or co-infection with other pathogens not detected by the test. Clinical correlation with patient history and other diagnostic information is necessary to determine patient infection status. The expected result is Negative.  Fact Sheet for Patients: BloggerCourse.com  Fact Sheet for Healthcare Providers: SeriousBroker.it  This test is not yet approved or cleared by the [place] FDA and  has been authorized for detection and/or diagnosis of SARS-CoV-2 by FDA under an Emergency Use Authorization (EUA).  This EUA will remain in effect (meaning this test can be used) for the duration of  the COVID-19 declaration under Section 564(b)(1) of the [place], 21 U.S.C. section 360bbb-3(b)(1), unless the authorization is terminated or revoked sooner.     Influenza Cyris Maalouf by PCR NEGATIVE NEGATIVE Final   Influenza B by PCR NEGATIVE NEGATIVE Final    Comment:  (NOTE) The Xpert Xpress SARS-CoV-2/FLU/RSV plus assay is intended as an aid in the diagnosis of influenza from Nasopharyngeal swab specimens and should not be used as Jahmier Willadsen sole basis for treatment. Nasal washings and aspirates are unacceptable for Xpert Xpress SARS-CoV-2/FLU/RSV testing.  Fact Sheet for Patients: BloggerCourse.com  Fact Sheet for Healthcare Providers: SeriousBroker.it  This test is not yet approved or cleared by the [place] FDA and has been authorized for detection and/or diagnosis of SARS-CoV-2 by FDA under an Emergency Use Authorization (EUA). This EUA will remain in effect (meaning this test can be used) for the duration of the COVID-19 declaration under Section 564(b)(1) of the Act, 21 U.S.C. section 360bbb-3(b)(1), unless the authorization is terminated or revoked.  Performed at [facility], [address], [place], [place] [address]          Radiology Studies: CT ABDOMEN PELVIS W CONTRAST  Result Date: 11/04/2021 CLINICAL DATA: Abdominal pain with inguinal hernia. EXAM: CT ABDOMEN AND PELVIS WITH CONTRAST TECHNIQUE: Multidetector CT imaging of the abdomen and pelvis was performed using the standard protocol following bolus administration of intravenous contrast. RADIATION DOSE REDUCTION: This exam was performed according to the departmental dose-optimization program which includes automated exposure control, adjustment of the mA and/or kV according to patient size and/or use of iterative reconstruction technique. CONTRAST:  OMNIPAQUE IOHEXOL 300 MG/ML  SOLN COMPARISON:  Comparison is made with 03/26/2021. FINDINGS: Lower chest: Lung bases are clear. No effusion, no consolidative changes. Hepatobiliary: Liver with smooth contours. The portal vein is patent. No focal, suspicious hepatic lesion. No pericholecystic stranding. No substantial biliary duct distension. Pancreas: Signs of  pancreatic atrophy without signs of adjacent inflammation. Spleen: Normal. Adrenals/Urinary Tract: Adrenal glands are unremarkable. Symmetric renal enhancement. No sign of hydronephrosis. No suspicious renal lesion or perinephric stranding. Urinary bladder is grossly unremarkable. Stomach/Bowel: Small hiatal hernia. Large periampullary duodenal diverticulum. Marked small bowel distension with herniation of multiple loops of bowel at different levels into Khaniyah Bezek large LEFT groin hernia. There hernia contains small bowel loops which show obstruction within the hernia sac and substantial fluid within the hernia sac. Stranding in the mesentery. Aperture of the hernia is approximately 4.3 cm as before. The hernia compresses the LEFT femoral vein and passes lateral and inferior to the pubic tubercle. Upstream loops of bowel are markedly dilated in the abdomen. There is mesenteric distortion without frank signs of mesenteric twist. Small  amounts of interloop fluid are developing in the abdomen. Within the abdomen bowel loops also show Cheyne Boulden closed loop type configuration with several in bound and out bound loops of bowel showing varying degrees of dilation. Most dilated loop of bowel at approximately 3.6 cm. Upstream loops are less dilated than the intra-abdominal closed loop which is located in the LEFT lower quadrant. Upstream proximal ileal and or jejunal loops can be tracked back to collapsed bowel in the upper abdomen though the transition is more gradual than the transition of the intra-abdominal closed loop obstruction. Vascular/Lymphatic: Calcified atheromatous plaque of the abdominal aorta tracking in the iliac vessels. There is no gastrohepatic or hepatoduodenal ligament lymphadenopathy. No retroperitoneal or mesenteric lymphadenopathy. No pelvic sidewall lymphadenopathy. Compression of mesenteric veins can be seen abruptly at the level of the hernia sac with marked change in caliber. Reproductive: Pelvic organ prolapse,  complex 3 compartment prolapse of pelvic viscera worse in the anterior middle compartments with inversion of the uterus. Urinary bladder is well below the symphysis pubis within prolapsed organs and the Foley catheter passes inferiorly from the urethral orifice into the urinary bladder. No gross adnexal mass. Streak artifact limits assessment of pelvic viscera including bowel loops in the pelvis. Other: No frank ascites within the abdomen but with signs of mesenteric edema and developing interloop fluid. Also with large amount of fluid in the hernia sac adjacent to dilated loops of bowel with signs of mesenteric edema. No pneumoperitoneum or pneumatosis at this time. Musculoskeletal: Spinal degenerative changes. No acute or destructive bone process. RIGHT hip arthroplasty with streak artifact that limits assessment of pelvic structures. IMPRESSION: 1. LEFT groin hernia with features that favor femoral hernia over inguinal hernia but with signs of closed loop obstruction both within the abdomen and within the hernia sac with multiple loops of bowel which are herniated into the hernia at multiple levels, involving proximal ileal or distal jejunal loops as well as distal and mid ileal loops with multiple sites of transition. 2. Based on appearance this is highly suspicious for incarcerated hernia. Surgical consultation is advised. 3. No current signs of pneumatosis but with developing interloop fluid and with moderately large amount of fluid in the hernia sac which is suspicious for at risk bowel in the setting of closed loop obstruction. 4. Compression of mesenteric veins further raises concern for potential for developing bowel ischemia. Bowel continues to enhance on the current exam. 5. Signs of complex pelvic organ prolapse as before. Aortic Atherosclerosis (ICD10-I70.0). Critical Value/emergent results were called by telephone at the time of interpretation on 11/04/2021 at 6:59 pm to provider DAVID YAO , who  verbally acknowledged these results. Electronically Signed   By: Donzetta Kohut M.D.   On: 11/04/2021 19:01        Scheduled Meds:  acetaminophen  1,000 mg Oral On Call to OR   acetaminophen  1,000 mg Oral Q6H   atorvastatin  20 mg Oral Daily   bisacodyl  10 mg Rectal Daily   Chlorhexidine Gluconate Cloth  6 each Topical Once   cholecalciferol  2,000 Units Oral Daily   [START ON 11/06/2021] enoxaparin (LOVENOX) injection  40 mg Subcutaneous Q24H   gabapentin  100 mg Oral BID   lip balm  1 application Topical BID   metoprolol tartrate  25 mg Oral BID   polycarbophil  625 mg Oral BID   Continuous Infusions:  lactated ringers     methocarbamol (ROBAXIN) IV       LOS: 1 day  Time spent: over 30 min    Lacretia Nicks, MD [facility]   To contact the attending provider between 7A-7P or the covering provider during after hours 7P-7A, please log into the web site [email or web address] and access using universal [place] password for that web site. If you do not have the password, please call the hospital operator.  11/05/2021, 3:46 PM

## 2021-11-05 NOTE — Progress Notes (Signed)
Transition of Care (TOC) Screening Note   Patient Details  Name: Hannah Downs Date of Birth: June 02, 1946   Transition of Care [facility]) CM/SW Contact:    Amada Jupiter, LCSW Phone Number: 11/05/2021, 12:50 PM    Transition of Care Department [facility]) has reviewed patient and no TOC needs have been identified at this time. We will continue to monitor patient advancement through interdisciplinary progression rounds. If new patient transition needs arise, please place a TOC consult.

## 2021-11-05 NOTE — Assessment & Plan Note (Addendum)
Resolved. follow

## 2021-11-05 NOTE — Assessment & Plan Note (Signed)
She reports testing positive on Hannah Downs home test on January 11th She's asymptomatic, without any new symptoms Isolation can be discontinued given within 90 days of recent infection

## 2021-11-05 NOTE — Op Note (Signed)
11/04/2021 - 11/05/2021  12:41 AM  PATIENT:  Hannah Downs  76 y.o. female  Patient Care Team: Harlan Stains, MD as PCP - General (Family Medicine) Nahser, Wonda Cheng, MD as PCP - Cardiology (Cardiology)  PRE-OPERATIVE DIAGNOSIS:  Recurrent left inguinal hernia - incarcerated with small bowel & causing obstruction  POST-OPERATIVE DIAGNOSIS:   Recurrent left femoral hernia - incarcerated with small bowel & causing obstruction Right femoral hernia Right obturator hernia   PROCEDURE:   Laparoscopic lysis of adhesions  Reduction of incarcerated recurrent left femoral hernia  Laparoscopic repair of incarcerated recurrent left femoral hernia   Laparoscopic repair of right femoral hernia  Laparoscopic repair of right obturator hernia TRANSVERSUS ABDOMINIS PLANE (TAP) BLOCK - BILATERAL  SURGEON:  Adin Hector, MD  ASSISTANT: OR Staff  ANESTHESIA:     Regional ilioinguinal and genitofemoral and spermatic cord nerve blocks  General  Regional TRANSVERSUS ABDOMINIS PLANE (TAP) nerve block for perioperative & postoperative pain control provided with liposomal bupivacaine (Experel) mixed with 0.25% bupivacaine as a Bilateral TAP block x 22mL each side at the level of the transverse abdominis & preperitoneal spaces along the flank at the anterior axillary line, from subcostal ridge to iliac crest under laparoscopic guidance    EBL:  Total I/O In: 2256.5 [I.V.:1000; IV Piggyback:1256.5] Out: - .  See anesthesia record  Delay start of Pharmacological VTE agent (>24hrs) due to surgical blood loss or risk of bleeding:  no  DRAINS: NONE  SPECIMEN:  NONE  DISPOSITION OF SPECIMEN:  N/A  COUNTS:  YES  PLAN OF CARE: Discharge to home after PACU  PATIENT DISPOSITION:  PACU - hemodynamically stable.  INDICATION: Elderly woman requiring emergent inguinal and femoral hernia repair primarily for incarceration followed up with open repair with mesh in 2008.  Complicated with recurrence  and small bowel within it.  Attempted robotic repair aborted.  Patient returned to ER with small bowel obstruction with small bowel incarcerated and recurrent large hernia.  Recommendation made for laparoscopic possible open reduction repair.  Possible small bowel resection.  Possible ostomy.  The anatomy & physiology of the abdominal wall and pelvic floor was discussed.  The pathophysiology of hernias in the inguinal and pelvic region was discussed.  Natural history risks such as progressive enlargement, pain, incarceration & strangulation was discussed.   Contributors to complications such as smoking, obesity, diabetes, prior surgery, etc were discussed.    I feel the risks of no intervention will lead to serious problems that outweigh the operative risks; therefore, I recommended surgery to reduce and repair the hernia.  I explained laparoscopic techniques with possible need for an open approach.  I noted usual use of mesh to patch and/or buttress hernia repair  Risks such as bleeding, infection, abscess, need for further treatment, heart attack, death, and other risks were discussed.  I noted a good likelihood this will help address the problem.   Goals of post-operative recovery were discussed as well.  Possibility that this will not correct all symptoms was explained.  I stressed the importance of low-impact activity, aggressive pain control, avoiding constipation, & not pushing through pain to minimize risk of post-operative chronic pain or injury. Possibility of reherniation was discussed.  We will work to minimize complications.     An educational handout further explaining the pathology & treatment options was given as well.  Questions were answered.  The patient expresses understanding & wishes to proceed with surgery.  Patient is fully anticoagulated on warfarin so was  placed on the rapid reversal protocol.  OR FINDINGS: Giant left femoral hernia incarcerated with numerous loops of small bowel  which was causing a closed-loop type obstruction.  No evidence of any necrosis or gangrene or perforation.  No obvious cellulitis.  Laparoscopic reduction and TEP type repair done of the large recurrent left femoral hernia.  Patient also had obvious right femoral and obturator hernias.  Laparoscopic repair done there as well.  Extra transfascial sutures done in the left lower quadrant to help hold the mesh to minimize a third left-sided groin hernia recurrence   DESCRIPTION:  The patient was identified & brought into the operating room. The patient was positioned supine with arms tucked. SCDs were active during the entire case. The patient underwent general anesthesia without any difficulty.   I reduced her prolapsed vagina back internally.  Her Foley with had already been in place.  Seem clear with good urine output so we decided not to replace it.  Orogastric tube was placed to low intermittent wall suction.  The abdomen was prepped and draped in a sterile fashion.  A Surgical Timeout confirmed our plan.  Peritoneal entry with a laparoscopic port was obtained using Varess spring needle entry technique in the left upper abdomen as the patient was positioned in reverse Trendelenburg.  I induced carbon dioxide insufflation.  No change in end tidal CO2 measurements.  Full symmetrical abdominal distention.  Initial port was carefully placed.  Camera inspection revealed no injury.  Extra ports were carefully placed under direct laparoscopic visualization.  With the patient asleep I was able to massage and reduce the chronically incarcerated left groin hernia.  I noted some inflamed and irritated small bowel with some chronic interloop adhesions.  However there was no obvious ischemia perforation or gangrene.  I ran the small bowel from the decompressed ileocecal valve and terminal ileum more proximally.  I did laparoscopic lysed lesions to free off a few interloop adhesions that were somewhat hairpin and to  help straighten out the bowel.  Her tissues were very friable and edematous so I avoided overly aggressive dissection but I confirmed there was no stricturing or narrowing or transition point anymore.  I ran the small bowel more proximally until I came to ligament of Treitz to confirm no other abnormalities.  Again I reinspected the proximal/mid ileum that clearly had been chronically incarcerated.  There is no evidence of any ischemia cellulitis or perforation.  I decided to go ahead and proceed with laparoscopic underlay hernia repair through a TEP approach.  I made a transverse incision through the inferior umbilical fold.  I made a small transverse nick through the anterior rectus fascia contralateral to the inguinal hernia side and placed a 0-vicryl stitch through the fascia.  I placed a Hasson trocar into the preperitoneal plane.  Entry was clean.  We induced carbon dioxide insufflation. Camera inspection revealed no injury.  I used a 43mm angled scope to bluntly free the peritoneum off the infraumbilical anterior abdominal wall.  I created enough of a preperitoneal pocket to place 60mm ports into the right & left mid-abdomen into this preperitoneal cavity.  I focused attention on the LEFT pelvis since that was the dominant hernia side.   I used blunt & focused sharp dissection to free the peritoneum off the flank and down to the pubic rim.  I freed the anteriolateral bladder wall off the anteriolateral pelvic wall, sparing midline attachments.   I located a swath of peritoneum going into a hernia  fascial defect at the  femoral foramen consistent with  a femoral hernia..  I freed the peritoneum off the round ligament.  Point to the round ligament was extremely weak and fragile and separated.  Assured hemostasis on both ends.  I freed peritoneum off the retroperitoneum along the psoas muscle -patient actually had some laxity to the posterior pelvic psoas foramen as well.  Only had fat within it..  I spent  extra time to try and free off the chronic hernia sac in the left femoral canal.  I cannot get the entire area out but is able to remove the posterior half quite easily.  Dense adhesions on the inguinal ligament and if carefully freed off.  The bladder had already been freed down.  I checked & assured hemostasis.     I turned attention on the opposite  RIGHT pelvis.  I did dissection in a similar, mirror-image fashion. The patient had a femoral hernia..   I was able to free peritoneum off the right round ligament more easily and keep it intact.  Confirmed an obvious right obturator hernia as well containing fat.  Compared to the left side.  Left obturator foramen was much more normal with no obvious hernia.  I checked & assured hemostasis.     Because there was no evidence of any cellulitis infection or gangrene, I decided do a more definitive repair with standard permanent mesh.  I chose sheets of medium-weight polypropylene Bard Marlex 15x15cm, one for each side.  I cut a single sigmoid-shaped slit ~6cm from a corner of each mesh.  I placed the meshes into the preperitoneal space & laid them as overlapping diamonds such that at the inferior points, a 6x6 cm corner flap rested in the true anterolateral pelvis, covering the obturator & femoral foramina.   I allowed the bladder to return to the pubis, this helping tuck the corners of the mesh in the anteriolateral pelvis.  The medial corners overlapped each other across midline cephalad to the pubic rim.   I did end up placing tacks laterally and superiorly and along the anterior pubic rim cephalad to help secure the mesh since she had horrible tissues and had recurrence for mesh before given the numerous hernias of moderate size, I placed a third 15x15cm mesh in the center as a vertical diamond.  Favoring going up into the left lower quadrant underneath good fascia to have excellent overlap with her giant left femoral hernia.  The lateral wings of the mesh  overlap across the direct spaces and internal rings where the dominant hernias were.  This provided good coverage and reinforcement of the hernia repairs.    I then placed laparoscopic ports in the upper abdomen intraperitoneally.  Her tissues were horrible and had several peritoneal breaches at the hernia sac and prior attempted robotic repair.  However she had enough redundant peritoneum that I could gradually tack it all up to cover up the mesh and keep it in the preperitoneal space to good result.  Because she had recurrence of very horrible tissues I ended up securing the left side of the mesh using #1 Prolene suture using a laparoscopic suture passer under laparoscopic guidance to pass through the abdominal wall Marlex mesh peritoneum and then come back out the other way and tied down.  I did that in the left anterior Midclavicular line in left medial suprapubic region to make sure that the left-sided mesh rested well to minimize femoral hernia recurrence.  Used a absorbable  secure strap tacker to gradually tack up the peritoneum  I reinspected the small bowel and abdomen and ran it again.  Again confirmed no perforation or abnormalities.  Small bowel looked more viable.  The closed-loop area was less dilated consistent with obstruction and being resolved.  I tried to bring down greater omentum but it was rather thin and spare.  Her transverse colon was somewhat redundant so I allowed that to come down to help cover of the peritoneum and protect the small bowel.  Held the hernia sacs cephalad & evacuated carbon dioxide.  I closed the fascia with absorbable suture.  I closed the skin using 4-0 monocryl stitch.  Sterile dressings were applied.   Patient is hemodynamically stable but near the end of the case went into rapid ventricular response with her chronic atrial fibrillation.  Discussed with Dr. Kalman Shan with anesthesia.  He was leaning toward have the patient go to stepdown unit.  I did place in some  orders for diltiazem and metoprolol options to help get her rapid rate under better control.  I had already discussed with Dr. Flossie Buffy with internal medicine at the start of the case, so hopefully they can help follow the patient with [place] and correct her atrial fibrillation, help plan timing of anticoagulation, monitor given her COVID-positive state the patient was extubated & arrived in the PACU in stable condition..  I had discussed postoperative care with the patient in the holding area.  Instructions are written in the chart.  I discussed operative findings, updated the patient's status, discussed probable steps to recovery, and gave postoperative recommendations to the  Patient's daughter .  Recommendations were made.  Questions were answered.  She expressed understanding & appreciation.   Adin Hector, M.D., F.A.C.S. Gastrointestinal and Minimally Invasive Surgery [facility] [address] [place], [place] [address] [phone] Main / Paging  11/05/2021 12:41 AM

## 2021-11-05 NOTE — Assessment & Plan Note (Addendum)
Replaced. °

## 2021-11-05 NOTE — Transfer of Care (Signed)
Immediate Anesthesia Transfer of Care Note  Patient: Hannah Downs  Procedure(s) Performed: lysis of adhesions reduction of incarcerated recurrent left femoral hernia laparoscopic repair. laparoscopic right femoral hernia repair and right obturator repair with mesh. tap block (Inguinal)  Patient Location: PACU  Anesthesia Type:General  Level of Consciousness: drowsy and patient cooperative  Airway & Oxygen Therapy: Patient Spontanous Breathing and Patient connected to face mask oxygen  Post-op Assessment: Report given to RN, Post -op Vital signs reviewed and stable and Post -op Vital signs reviewed and unstable, Anesthesiologist notified--MDA aware of afib and heart rate  Post vital signs: Reviewed and stable  Last Vitals:  Vitals Value Taken Time  BP 106/92 11/05/21 0045  Temp    Pulse 128 11/05/21 0046  Resp 18 11/05/21 0046  SpO2 98 % 11/05/21 0045  Vitals shown include unvalidated device data.  Last Pain:  Vitals:   11/04/21 1844  PainSc: 0-No pain         Complications: No notable events documented.

## 2021-11-05 NOTE — Progress Notes (Signed)
°  Consulted by Dr. Johney Maine earlier this evening to follow pt after emergent surgery. However she remained in OR until near end of my shift. I have discuss this with my colleague Dr. Myna Hidalgo who will evaluate her tonight.

## 2021-11-05 NOTE — Progress Notes (Addendum)
[facility] Progress Note  1 Day Post-Op  Subjective: CC: No acute events overnight Denies pain, nausea, vomiting, or belching.  Denies flatus or BM since surgery.  Has not been out of bed since surgery.  Objective: Vital signs in last 24 hours: Temp:  [97.3 F (36.3 C)-98.4 F (36.9 C)] 98.4 F (36.9 C) (02/23 0925) Pulse Rate:  [25-113] 80 (02/23 0925) Resp:  [14-20] 16 (02/23 0925) BP: (89-170)/(55-111) 96/67 (02/23 0925) SpO2:  [93 %-100 %] 100 % (02/23 0925) Weight:  [52.6 kg] 52.6 kg (02/22 1709) Last BM Date : 11/04/21  Intake/Output from previous day: 02/22 0701 - 02/23 0700 In: 2681.5 [I.V.:1325; IV Piggyback:1356.5] Out: 1500 [Urine:1400; Blood:100] Intake/Output this shift: No intake/output data recorded.  PE: Gen:  Alert, NAD, pleasant Card:  Regular rate and rhythm Pulm:  Normal effort Abd: Soft, mild distention, overall nontender, port sites clean dry and intact, left groin with a soft, fluid-filled bulge -suspect postoperative hematoma.  It is not tense, there are not overlying skin changes. Skin: warm and dry, no rashes  Psych: A&Ox3   Lab Results:  Recent Labs    11/04/21 1739 11/04/21 1746 11/05/21 0430  WBC 12.9*  --  17.5*  HGB 15.2* 16.0* 12.1  HCT 44.6 47.0* 35.8*  PLT 341  --  295   BMET Recent Labs    11/04/21 1739 11/04/21 1746 11/05/21 0430  NA 129* 130* 130*  K 3.7 3.8 3.6  CL 93* 94* 98  CO2 24  --  24  GLUCOSE 129* 131* 163*  BUN 18 16 14   CREATININE 0.64 0.60 0.60  CALCIUM 10.0  --  8.5*   PT/INR Recent Labs    11/04/21 1739 11/05/21 0430  LABPROT 28.8* 15.2  INR 2.7* 1.2   CMP     Component Value Date/Time   NA 130 (L) 11/05/2021 0430   NA 137 08/03/2021 1247   K 3.6 11/05/2021 0430   CL 98 11/05/2021 0430   CO2 24 11/05/2021 0430   GLUCOSE 163 (H) 11/05/2021 0430   BUN 14 11/05/2021 0430   BUN 14 08/03/2021 1247   CREATININE 0.60 11/05/2021 0430   CALCIUM 8.5 (L) 11/05/2021 0430   PROT 7.8  06/18/2014 1440   ALBUMIN 4.6 06/18/2014 1440   AST 20 06/18/2014 1440   ALT 12 06/18/2014 1440   ALKPHOS 59 06/18/2014 1440   BILITOT 1.5 (H) 06/18/2014 1440   GFRNONAA >60 11/05/2021 0430   GFRAA >90 06/27/2014 0425   Lipase  No results found for: LIPASE     Studies/Results: CT ABDOMEN PELVIS W CONTRAST  Result Date: 11/04/2021 CLINICAL DATA: Abdominal pain with inguinal hernia. EXAM: CT ABDOMEN AND PELVIS WITH CONTRAST TECHNIQUE: Multidetector CT imaging of the abdomen and pelvis was performed using the standard protocol following bolus administration of intravenous contrast. RADIATION DOSE REDUCTION: This exam was performed according to the departmental dose-optimization program which includes automated exposure control, adjustment of the mA and/or kV according to patient size and/or use of iterative reconstruction technique. CONTRAST:  152mL OMNIPAQUE IOHEXOL 300 MG/ML  SOLN COMPARISON:  Comparison is made with 03/26/2021. FINDINGS: Lower chest: Lung bases are clear. No effusion, no consolidative changes. Hepatobiliary: Liver with smooth contours. The portal vein is patent. No focal, suspicious hepatic lesion. No pericholecystic stranding. No substantial biliary duct distension. Pancreas: Signs of pancreatic atrophy without signs of adjacent inflammation. Spleen: Normal. Adrenals/Urinary Tract: Adrenal glands are unremarkable. Symmetric renal enhancement. No sign of hydronephrosis. No suspicious renal lesion or  perinephric stranding. Urinary bladder is grossly unremarkable. Stomach/Bowel: Small hiatal hernia. Large periampullary duodenal diverticulum. Marked small bowel distension with herniation of multiple loops of bowel at different levels into a large LEFT groin hernia. There hernia contains small bowel loops which show obstruction within the hernia sac and substantial fluid within the hernia sac. Stranding in the mesentery. Aperture of the hernia is approximately 4.3 cm as before. The  hernia compresses the LEFT femoral vein and passes lateral and inferior to the pubic tubercle. Upstream loops of bowel are markedly dilated in the abdomen. There is mesenteric distortion without frank signs of mesenteric twist. Small amounts of interloop fluid are developing in the abdomen. Within the abdomen bowel loops also show a closed loop type configuration with several in bound and out bound loops of bowel showing varying degrees of dilation. Most dilated loop of bowel at approximately 3.6 cm. Upstream loops are less dilated than the intra-abdominal closed loop which is located in the LEFT lower quadrant. Upstream proximal ileal and or jejunal loops can be tracked back to collapsed bowel in the upper abdomen though the transition is more gradual than the transition of the intra-abdominal closed loop obstruction. Vascular/Lymphatic: Calcified atheromatous plaque of the abdominal aorta tracking in the iliac vessels. There is no gastrohepatic or hepatoduodenal ligament lymphadenopathy. No retroperitoneal or mesenteric lymphadenopathy. No pelvic sidewall lymphadenopathy. Compression of mesenteric veins can be seen abruptly at the level of the hernia sac with marked change in caliber. Reproductive: Pelvic organ prolapse, complex 3 compartment prolapse of pelvic viscera worse in the anterior middle compartments with inversion of the uterus. Urinary bladder is well below the symphysis pubis within prolapsed organs and the Foley catheter passes inferiorly from the urethral orifice into the urinary bladder. No gross adnexal mass. Streak artifact limits assessment of pelvic viscera including bowel loops in the pelvis. Other: No frank ascites within the abdomen but with signs of mesenteric edema and developing interloop fluid. Also with large amount of fluid in the hernia sac adjacent to dilated loops of bowel with signs of mesenteric edema. No pneumoperitoneum or pneumatosis at this time. Musculoskeletal: Spinal  degenerative changes. No acute or destructive bone process. RIGHT hip arthroplasty with streak artifact that limits assessment of pelvic structures. IMPRESSION: 1. LEFT groin hernia with features that favor femoral hernia over inguinal hernia but with signs of closed loop obstruction both within the abdomen and within the hernia sac with multiple loops of bowel which are herniated into the hernia at multiple levels, involving proximal ileal or distal jejunal loops as well as distal and mid ileal loops with multiple sites of transition. 2. Based on appearance this is highly suspicious for incarcerated hernia. Surgical consultation is advised. 3. No current signs of pneumatosis but with developing interloop fluid and with moderately large amount of fluid in the hernia sac which is suspicious for at risk bowel in the setting of closed loop obstruction. 4. Compression of mesenteric veins further raises concern for potential for developing bowel ischemia. Bowel continues to enhance on the current exam. 5. Signs of complex pelvic organ prolapse as before. Aortic Atherosclerosis (ICD10-I70.0). Critical Value/emergent results were called by telephone at the time of interpretation on 11/04/2021 at 6:59 pm to provider DAVID YAO , who verbally acknowledged these results. Electronically Signed   By: Zetta Bills M.D.   On: 11/04/2021 19:01    Anti-infectives: Anti-infectives (From admission, onward)    Start     Dose/Rate Route Frequency Ordered Stop   11/05/21 0600  ceFAZolin (ANCEF) IVPB 2g/100 mL premix        2 g 200 mL/hr over 30 Minutes Intravenous Every 8 hours 11/05/21 0247 11/05/21 2159   11/04/21 2130  clindamycin (CLEOCIN) IVPB 900 mg       See Hyperspace for full Linked Orders Report.   900 mg 100 mL/hr over 30 Minutes Intravenous On call to O.R. 11/04/21 2027 11/04/21 2237   11/04/21 2115  gentamicin (GARAMYCIN) 260 mg in dextrose 5 % 100 mL IVPB       See Hyperspace for full Linked Orders Report.    5 mg/kg  52.6 kg 106.5 mL/hr over 60 Minutes Intravenous On call to O.R. 11/04/21 2027 11/04/21 2216   11/04/21 1945  piperacillin-tazobactam (ZOSYN) IVPB 3.375 g        3.375 g 100 mL/hr over 30 Minutes Intravenous  Once 11/04/21 1940 11/04/21 2050        Assessment/Plan  . Incarcerated left femoral hernia cause bowel obstruction - S/p emergent laparoscopic LOA, reduction left femoral hernia, repair of left femoral hernia with mesh, repair of R femoral and R obturator hernias with mesh. 11/05/21 Dr. Johney Maine - afebrile, hemoglobin 12.1 from 16 with signs of postoperative hematoma of the left groin.  Continue to hold anticoagulation today -Continue clear liquid diet, advance to full liquids as tolerated today -Monitor H&H and monitor hematoma for enlargement, repeat CBC ordered for 1400  Rapid atrial fibrillation, on coumadin at home - INR was 2.7 in ED, given vit K and Kcentra  - Now rate controlled on the floor, takes warfarin at home, continue to hold - INR 1.2 today  COVID+  hyponatremia  HLD  HTN    LOS: 1 day   I reviewed nursing notes, hospitalist notes, last 24 h vitals and pain scores, last 48 h intake and output, last 24 h labs and trends, and last 24 h imaging results.  This care required moderate level of medical decision making.   Obie Dredge, PA-C [facility] Please see Amion for pager number during day hours 7:00am-4:30pm

## 2021-11-05 NOTE — Progress Notes (Signed)
Patient has an active cardiac monitoring order placed on 11/05/2021 @ 0256 and was sent to [facility] which is not a cardiac monitoring floor. This RN paged on call hospitalist, Jeannette Corpus NP, after reviewing orders and notes The patient will be transferred to a floor with monitoring.

## 2021-11-05 NOTE — Assessment & Plan Note (Addendum)
Sub optimal.  D/c IV fluids as she is on soft diet.

## 2021-11-05 NOTE — Assessment & Plan Note (Addendum)
Sinus at presentation, developed RVR in OR Now back in sinus.  Rate controlled lopressor 12.5 mg BID.  Transition to DOAC on discharge for anti coagulation.  Family and patient looking for the CO pay amount.

## 2021-11-06 DIAGNOSIS — R8271 Bacteriuria: Secondary | ICD-10-CM

## 2021-11-06 DIAGNOSIS — K4190 Unilateral femoral hernia, without obstruction or gangrene, not specified as recurrent: Secondary | ICD-10-CM

## 2021-11-06 DIAGNOSIS — R17 Unspecified jaundice: Secondary | ICD-10-CM

## 2021-11-06 LAB — BASIC METABOLIC PANEL
Anion gap: 10 (ref 5–15)
BUN: 17 mg/dL (ref 8–23)
CO2: 22 mmol/L (ref 22–32)
Calcium: 8.2 mg/dL — ABNORMAL LOW (ref 8.9–10.3)
Chloride: 99 mmol/L (ref 98–111)
Creatinine, Ser: 0.57 mg/dL (ref 0.44–1.00)
GFR, Estimated: >60 mL/min (ref >60)
Glucose, Bld: 109 mg/dL — ABNORMAL HIGH (ref 70–99)
Potassium: 4.4 mmol/L (ref 3.5–5.1)
Sodium: 131 mmol/L — ABNORMAL LOW (ref 135–145)

## 2021-11-06 LAB — COMPREHENSIVE METABOLIC PANEL
ALT: 17 U/L (ref 0–44)
AST: 28 U/L (ref 15–41)
Albumin: 3.4 g/dL — ABNORMAL LOW (ref 3.5–5.0)
Alkaline Phosphatase: 37 U/L — ABNORMAL LOW (ref 38–126)
Anion gap: 7 (ref 5–15)
BUN: 24 mg/dL — ABNORMAL HIGH (ref 8–23)
CO2: 25 mmol/L (ref 22–32)
Calcium: 8.6 mg/dL — ABNORMAL LOW (ref 8.9–10.3)
Chloride: 95 mmol/L — ABNORMAL LOW (ref 98–111)
Creatinine, Ser: 1.08 mg/dL — ABNORMAL HIGH (ref 0.44–1.00)
GFR, Estimated: 53 mL/min — ABNORMAL LOW (ref >60)
Glucose, Bld: 125 mg/dL — ABNORMAL HIGH (ref 70–99)
Potassium: 4.7 mmol/L (ref 3.5–5.1)
Sodium: 127 mmol/L — ABNORMAL LOW (ref 135–145)
Total Bilirubin: 1.9 mg/dL — ABNORMAL HIGH (ref 0.3–1.2)
Total Protein: 6 g/dL — ABNORMAL LOW (ref 6.5–8.1)

## 2021-11-06 LAB — PROTIME-INR
INR: 1.1 (ref 0.8–1.2)
Prothrombin Time: 14.1 seconds (ref 11.4–15.2)

## 2021-11-06 LAB — CBC
HCT: 33 % — ABNORMAL LOW (ref 36.0–46.0)
Hemoglobin: 11.2 g/dL — ABNORMAL LOW (ref 12.0–15.0)
MCH: 31.2 pg (ref 26.0–34.0)
MCHC: 33.9 g/dL (ref 30.0–36.0)
MCV: 91.9 fL (ref 80.0–100.0)
Platelets: 257 10*3/uL (ref 150–400)
RBC: 3.59 MIL/uL — ABNORMAL LOW (ref 3.87–5.11)
RDW: 13.7 % (ref 11.5–15.5)
WBC: 11.9 10*3/uL — ABNORMAL HIGH (ref 4.0–10.5)
nRBC: 0 % (ref 0.0–0.2)

## 2021-11-06 LAB — OSMOLALITY: Osmolality: 276 mOsm/kg (ref 275–295)

## 2021-11-06 LAB — SODIUM, URINE, RANDOM: Sodium, Ur: <10 mmol/L

## 2021-11-06 LAB — OSMOLALITY, URINE: Osmolality, Ur: 274 mOsm/kg — ABNORMAL LOW (ref 300–900)

## 2021-11-06 LAB — PHOSPHORUS: Phosphorus: 3.4 mg/dL (ref 2.5–4.6)

## 2021-11-06 LAB — MAGNESIUM: Magnesium: 2.8 mg/dL — ABNORMAL HIGH (ref 1.7–2.4)

## 2021-11-06 LAB — HEPARIN LEVEL (UNFRACTIONATED): Heparin Unfractionated: 0.12 IU/mL — ABNORMAL LOW (ref 0.30–0.70)

## 2021-11-06 MED ORDER — SODIUM CHLORIDE 0.9 % IV BOLUS
1000.0000 mL | Freq: Once | INTRAVENOUS | Status: AC
  Administered 2021-11-06: 1000 mL via INTRAVENOUS

## 2021-11-06 MED ORDER — ENSURE SURGERY PO LIQD
237.0000 mL | ORAL | Status: DC
  Administered 2021-11-07 – 2021-11-08 (×2): 237 mL via ORAL
  Filled 2021-11-06 (×4): qty 237

## 2021-11-06 MED ORDER — HEPARIN (PORCINE) 25000 UT/250ML-% IV SOLN
900.0000 [IU]/h | INTRAVENOUS | Status: DC
  Administered 2021-11-06 – 2021-11-07 (×2): 900 [IU]/h via INTRAVENOUS
  Filled 2021-11-06: qty 250

## 2021-11-06 MED ORDER — ADULT MULTIVITAMIN W/MINERALS CH
1.0000 | ORAL_TABLET | Freq: Every day | ORAL | Status: DC
  Administered 2021-11-06 – 2021-11-09 (×4): 1 via ORAL
  Filled 2021-11-06 (×4): qty 1

## 2021-11-06 MED ORDER — HEPARIN (PORCINE) 25000 UT/250ML-% IV SOLN
750.0000 [IU]/h | INTRAVENOUS | Status: DC
  Administered 2021-11-06: 750 [IU]/h via INTRAVENOUS
  Filled 2021-11-06: qty 250

## 2021-11-06 NOTE — Assessment & Plan Note (Signed)
Mild, follow °

## 2021-11-06 NOTE — Progress Notes (Signed)
Pharmacy Brief Note - Evening Anticoagulation Follow Up:  Patient is a 69 yoF who was started on heparin drip today for atrial fibrillation. Warfarin remains on hold post-op. For full history, see note by Dimple Nanas, PharmD from earlier today.   Assessment: HL = 0.12 is subtherapeutic on heparin infusion of 750 units/hr Confirmed with RN that heparin infusing at correct rate. No interruptions. No signs of bleeding.   Goal: HL 0.3 - 0.7  Plan: No heparin boluses post-op Increase heparin infusion to 900 units/hr Check 8 hour HL HL, CBC daily while on heparin infusion Monitor for signs of bleeding  Lenis Noon, PharmD 11/06/21 2:52 PM

## 2021-11-06 NOTE — Progress Notes (Signed)
2 Days Post-Op  Subjective: CC: Feeling great. Some soreness around her incisions but feels her pain is well controlled on scheduled tylenol and prn ultram. Tolerating liquids without n/v. Passing flatus. No BM. Foley is chronic. Mobilizing in the room.   Objective: Vital signs in last 24 hours: Temp:  [97.9 F (36.6 C)-98.5 F (36.9 C)] 98.2 F (36.8 C) (02/24 0631) Pulse Rate:  [51-106] 71 (02/24 0631) Resp:  [17-18] 17 (02/24 0631) BP: (85-105)/(55-71) 105/70 (02/24 0631) SpO2:  [95 %-100 %] 99 % (02/24 0631) Last BM Date : 11/04/21  Intake/Output from previous day: 02/23 0701 - 02/24 0700 In: 926 [P.O.:120; I.V.:375.8; IV Piggyback:430.1] Out: 650 [Urine:650] Intake/Output this shift: No intake/output data recorded.  PE: Gen:  Alert, NAD, pleasant Card:  Reg Pulm:  Normal effort Abd: Soft, mild distention, appropriately tender around incisions, port sites clean, dry and intact with steri-strips present. There is mild swelling in the left groin consistent with chronically distended tissue from where she had a chronically incarcerated hernia there, but this is very soft, mildly ecchymotic, no major hematoma.  It is not tense, there are not overlying skin changes. Skin: warm and dry, no rashes  Psych: A&Ox3   Lab Results:  Recent Labs    11/05/21 1341 11/05/21 2113 11/06/21 0520  WBC 14.0*  --  11.9*  HGB 11.7* 11.7* 11.2*  HCT 34.9* 34.9* 33.0*  PLT 309  --  257   BMET Recent Labs    11/05/21 0430 11/06/21 0520  NA 130* 127*  K 3.6 4.7  CL 98 95*  CO2 24 25  GLUCOSE 163* 125*  BUN 14 24*  CREATININE 0.60 1.08*  CALCIUM 8.5* 8.6*   PT/INR Recent Labs    11/05/21 0430 11/06/21 0520  LABPROT 15.2 14.1  INR 1.2 1.1   CMP     Component Value Date/Time   NA 127 (L) 11/06/2021 0520   NA 137 08/03/2021 1247   K 4.7 11/06/2021 0520   CL 95 (L) 11/06/2021 0520   CO2 25 11/06/2021 0520   GLUCOSE 125 (H) 11/06/2021 0520   BUN 24 (H) 11/06/2021  0520   BUN 14 08/03/2021 1247   CREATININE 1.08 (H) 11/06/2021 0520   CALCIUM 8.6 (L) 11/06/2021 0520   PROT 6.0 (L) 11/06/2021 0520   ALBUMIN 3.4 (L) 11/06/2021 0520   AST 28 11/06/2021 0520   ALT 17 11/06/2021 0520   ALKPHOS 37 (L) 11/06/2021 0520   BILITOT 1.9 (H) 11/06/2021 0520   GFRNONAA 53 (L) 11/06/2021 0520   GFRAA >90 06/27/2014 0425   Lipase  No results found for: LIPASE  Studies/Results: CT ABDOMEN PELVIS W CONTRAST  Result Date: 11/04/2021 CLINICAL DATA: Abdominal pain with inguinal hernia. EXAM: CT ABDOMEN AND PELVIS WITH CONTRAST TECHNIQUE: Multidetector CT imaging of the abdomen and pelvis was performed using the standard protocol following bolus administration of intravenous contrast. RADIATION DOSE REDUCTION: This exam was performed according to the departmental dose-optimization program which includes automated exposure control, adjustment of the mA and/or kV according to patient size and/or use of iterative reconstruction technique. CONTRAST:  OMNIPAQUE IOHEXOL 300 MG/ML  SOLN COMPARISON:  Comparison is made with 03/26/2021. FINDINGS: Lower chest: Lung bases are clear. No effusion, no consolidative changes. Hepatobiliary: Liver with smooth contours. The portal vein is patent. No focal, suspicious hepatic lesion. No pericholecystic stranding. No substantial biliary duct distension. Pancreas: Signs of pancreatic atrophy without signs of adjacent inflammation. Spleen: Normal. Adrenals/Urinary Tract: Adrenal glands are unremarkable.  Symmetric renal enhancement. No sign of hydronephrosis. No suspicious renal lesion or perinephric stranding. Urinary bladder is grossly unremarkable. Stomach/Bowel: Small hiatal hernia. Large periampullary duodenal diverticulum. Marked small bowel distension with herniation of multiple loops of bowel at different levels into a large LEFT groin hernia. There hernia contains small bowel loops which show obstruction within the hernia sac and  substantial fluid within the hernia sac. Stranding in the mesentery. Aperture of the hernia is approximately 4.3 cm as before. The hernia compresses the LEFT femoral vein and passes lateral and inferior to the pubic tubercle. Upstream loops of bowel are markedly dilated in the abdomen. There is mesenteric distortion without frank signs of mesenteric twist. Small amounts of interloop fluid are developing in the abdomen. Within the abdomen bowel loops also show a closed loop type configuration with several in bound and out bound loops of bowel showing varying degrees of dilation. Most dilated loop of bowel at approximately 3.6 cm. Upstream loops are less dilated than the intra-abdominal closed loop which is located in the LEFT lower quadrant. Upstream proximal ileal and or jejunal loops can be tracked back to collapsed bowel in the upper abdomen though the transition is more gradual than the transition of the intra-abdominal closed loop obstruction. Vascular/Lymphatic: Calcified atheromatous plaque of the abdominal aorta tracking in the iliac vessels. There is no gastrohepatic or hepatoduodenal ligament lymphadenopathy. No retroperitoneal or mesenteric lymphadenopathy. No pelvic sidewall lymphadenopathy. Compression of mesenteric veins can be seen abruptly at the level of the hernia sac with marked change in caliber. Reproductive: Pelvic organ prolapse, complex 3 compartment prolapse of pelvic viscera worse in the anterior middle compartments with inversion of the uterus. Urinary bladder is well below the symphysis pubis within prolapsed organs and the Foley catheter passes inferiorly from the urethral orifice into the urinary bladder. No gross adnexal mass. Streak artifact limits assessment of pelvic viscera including bowel loops in the pelvis. Other: No frank ascites within the abdomen but with signs of mesenteric edema and developing interloop fluid. Also with large amount of fluid in the hernia sac adjacent to  dilated loops of bowel with signs of mesenteric edema. No pneumoperitoneum or pneumatosis at this time. Musculoskeletal: Spinal degenerative changes. No acute or destructive bone process. RIGHT hip arthroplasty with streak artifact that limits assessment of pelvic structures. IMPRESSION: 1. LEFT groin hernia with features that favor femoral hernia over inguinal hernia but with signs of closed loop obstruction both within the abdomen and within the hernia sac with multiple loops of bowel which are herniated into the hernia at multiple levels, involving proximal ileal or distal jejunal loops as well as distal and mid ileal loops with multiple sites of transition. 2. Based on appearance this is highly suspicious for incarcerated hernia. Surgical consultation is advised. 3. No current signs of pneumatosis but with developing interloop fluid and with moderately large amount of fluid in the hernia sac which is suspicious for at risk bowel in the setting of closed loop obstruction. 4. Compression of mesenteric veins further raises concern for potential for developing bowel ischemia. Bowel continues to enhance on the current exam. 5. Signs of complex pelvic organ prolapse as before. Aortic Atherosclerosis (ICD10-I70.0). Critical Value/emergent results were called by telephone at the time of interpretation on 11/04/2021 at 6:59 pm to provider DAVID YAO , who verbally acknowledged these results. Electronically Signed   By: Zetta Bills M.D.   On: 11/04/2021 19:01    Anti-infectives: Anti-infectives (From admission, onward)    Start  Dose/Rate Route Frequency Ordered Stop   11/05/21 0600  ceFAZolin (ANCEF) IVPB 2g/100 mL premix        2 g 200 mL/hr over 30 Minutes Intravenous Every 8 hours 11/05/21 0247 11/05/21 1421   11/04/21 2130  clindamycin (CLEOCIN) IVPB 900 mg       See Hyperspace for full Linked Orders Report.   900 mg 100 mL/hr over 30 Minutes Intravenous On call to O.R. 11/04/21 2027 11/04/21 2237    11/04/21 2115  gentamicin (GARAMYCIN) 260 mg in dextrose 5 % 100 mL IVPB       See Hyperspace for full Linked Orders Report.   5 mg/kg  52.6 kg 106.5 mL/hr over 60 Minutes Intravenous On call to O.R. 11/04/21 2027 11/04/21 2216   11/04/21 1945  piperacillin-tazobactam (ZOSYN) IVPB 3.375 g        3.375 g 100 mL/hr over 30 Minutes Intravenous  Once 11/04/21 1940 11/04/21 2050        Assessment/Plan POD 1 S/p emergent laparoscopic LOA, reduction left femoral hernia, repair of left femoral hernia with mesh, repair of R femoral and R obturator hernias with mesh. 11/05/21 Dr. Johney Maine - Doing well. AF - Adv diet - Mobilize - Pulm toilet   Rapid atrial fibrillation, on coumadin at home - Hgb stable. INR 1.1. Okay to restart heparin gtt, no bolus  FEN: Soft diet, IVF per [facility] VTE: SCDs, heparin gtt ID: Ancef periop. No further abx indicated from our standpoint Foley: chronic. F/u outpatient.   + UCx - > 100,000 gram neg rods. Final results pending. Defer tx to primary w/ chronic indwelling foley  AKI - Cr 1.08 from baseline of 0.6 COVID+ - previous + test. Not on precuations hyponatremia  HLD  HTN   This care is post op   LOS: 2 days    Jillyn Ledger , [facility] 11/06/2021, 9:26 AM Please see Amion for pager number during day hours 7:00am-4:30pm

## 2021-11-06 NOTE — Progress Notes (Signed)
Initial Nutrition Assessment  INTERVENTION:   -Ensure Surgery PO daily, each provides 330 kcals and 18g protein   -Multivitamin with minerals daily  NUTRITION DIAGNOSIS:   Increased nutrient needs related to acute illness, post-op healing as evidenced by estimated needs.  GOAL:   Patient will meet greater than or equal to 90% of their needs  MONITOR:   PO intake, Supplement acceptance, Labs, Weight trends, I & O's  REASON FOR ASSESSMENT:   Malnutrition Screening Tool    ASSESSMENT:   76 y.o. female with past medical history of paroxysmal atrial fibrillation on Coumadin and left inguinal hernia who presents to the emergency department with lower abdominal pain, nausea, and vomiting.  Patient has been having some increased pain at the site of her left inguinal hernia over the past 3 to 4 days, developed significant worsening over the past 24 hours, has had recurrent nausea and vomiting since early a.m. of 11/04/2021.  2/23: s/p LOA, Reduction of incarcerated recurrent left femoral hernia, lap repair of left femoral hernia, right femoral hernia, right obturator hernia  Patient now on soft diet. Pt tolerated mostly clears yesterday of broth and juice. Pt had a tray of full liquids this morning for breakfast (grits, pudding, coffee). No solids yet today.  PTA pt was having N/V. H/o COVID-19 last month.   Per weight records, pt has lost 7 lbs since 01/14/21 (5% wt loss x 9.5 months, insignificant for time frame). Weight trending down since then.   Medications: Vitamin D, Fibercon  Labs reviewed:  Low Na Elevated Mg (2.8)  NUTRITION - FOCUSED PHYSICAL EXAM:  Unable to complete, working remotely.  Diet Order:   Diet Order             DIET SOFT Room service appropriate? Yes; Fluid consistency: Thin  Diet effective now                   EDUCATION NEEDS:   No education needs have been identified at this time  Skin:  Skin Assessment: Skin Integrity Issues: Skin  Integrity Issues:: Incisions Incisions: 2/23 abdomen  Last BM:  2/24 -type 3  Height:   Ht Readings from Last 1 Encounters:  11/04/21 5\' 1"  (1.549 m)    Weight:   Wt Readings from Last 1 Encounters:  11/04/21 52.6 kg    BMI:  Body mass index is 21.92 kg/m.  Estimated Nutritional Needs:   Kcal:  1550-1750  Protein:  65-80g  Fluid:  1.7L/day  Clayton Bibles, MS, RD, LDN Inpatient Clinical Dietitian Contact information available via Amion

## 2021-11-06 NOTE — Assessment & Plan Note (Addendum)
Has chronic indwelling catheter No fever or clear UTI symptoms Growing >100,000 pseudomonas and >100,000 klebsiella - will treat these as asymptomatic bacteruria unless she develops symptoms

## 2021-11-06 NOTE — Anesthesia Postprocedure Evaluation (Signed)
Anesthesia Post Note  Patient: Hannah Downs  Procedure(s) Performed: lysis of adhesions reduction of incarcerated recurrent left femoral hernia laparoscopic repair. laparoscopic right femoral hernia repair and right obturator repair with mesh. tap block (Inguinal)     Patient location during evaluation: PACU Anesthesia Type: General Level of consciousness: awake and alert Pain management: pain level controlled Vital Signs Assessment: post-procedure vital signs reviewed and stable Respiratory status: spontaneous breathing, nonlabored ventilation, respiratory function stable and patient connected to nasal cannula oxygen Cardiovascular status: blood pressure returned to baseline and stable Postop Assessment: no apparent nausea or vomiting Anesthetic complications: no   No notable events documented.  Last Vitals:  Vitals:   11/06/21 0423 11/06/21 0631  BP: 96/67 105/70  Pulse:  71  Resp:  17  Temp:  36.8 C  SpO2:  99%    Last Pain:  Vitals:   11/06/21 0631  TempSrc: Oral  PainSc:                  [name] S

## 2021-11-06 NOTE — Progress Notes (Signed)
Consult NOTE    Hannah Downs  ZOX:[id number] DOB: 04-09-1946 DOA: 11/04/2021 PCP: Hannah Montana, MD  Chief Complaint  Patient presents with   Hernia    Brief Narrative:   Hannah Downs is Hannah Downs 76 y.o. female with past medical history of paroxysmal atrial fibrillation on Coumadin and left inguinal hernia who presents to the emergency department with lower abdominal pain, nausea, and vomiting.  She was found to have Hannah Downs recurrent left femoral hernia incarcerated with small bowel and causing obstruction.  She's now s/p surgery on 2/23.    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/05/2021   Assessment and Plan: * Femoral hernia of left side Presented with L femoral hernia which was incarcerated with small bowel and causing obstruction Now s/p laparoscopic lysis of adhesions, reduction of incarcerated recurrent left femoral hernia, laparoscopic repair of incarcerated recurrent left femoral hernia, laparascopic repiar of right femoral hernia, laparoscopic repair of right obturator hernia, TAP block bilateral Post op course per surgery  Paroxysmal atrial fibrillation with RVR (HCC)- (present on admission) Sinus at presentation, developed RVR in OR Continue metop, rate improved at this time (holding parameters for rate and BP - had some bradycardia yesterday) S/p 10 mg vit K and K centra in ED Warfarin  currently on hold.   Heparin gtt restarted Consider resumption of warfarin tomorrow per surgery  Asymptomatic bacteriuria Has chronic indwelling catheter No fever or clear UTI symptoms Growing >100,000 pseudomonas and >100,000 gram negative rods - will treat these as asymptomatic bacteruria unless she develops symptoms  Hyponatremia- (present on admission) With low urine sodium and exam, suspect hypovolemic Follow with NS bolus  HCTZ could exacerbate   COVID-19 virus detected- (present on admission) She reports testing positive on Hannah Downs home test on January 11th She's asymptomatic, without any new symptoms Isolation can be discontinued given within 90 days of recent infection   Hypomagnesemia Replaced  Essential hypertension- (present on admission) BP currently on softer side Hold HCTZ for now Continue metop with holding parameters  Leukocytosis Related to above, follow  Hyperlipidemia- (present on admission) statin   DVT prophylaxis: SCD Code Status: ful Family Communication: none  Disposition: per surgery   Consultants:  General surgery is primary service TRH is primary  Procedures:  2/23 Laparoscopic lysis of adhesions  Reduction of incarcerated recurrent left femoral hernia  Laparoscopic repair of incarcerated recurrent left femoral hernia   Laparoscopic repair of right femoral hernia  Laparoscopic repair of right obturator hernia TRANSVERSUS ABDOMINIS PLANE (TAP) BLOCK - BILATERAL  Antimicrobials:  Anti-infectives (From admission, onward)    Start     Dose/Rate Route Frequency Ordered Stop   11/05/21 0600  ceFAZolin (ANCEF) IVPB 2g/100 mL premix        2 g 200 mL/hr over 30 Minutes Intravenous Every 8 hours 11/05/21 0247 11/05/21 1421   11/04/21 2130  clindamycin (CLEOCIN) IVPB 900 mg  See Hyperspace for full Linked Orders Report.   900 mg 100 mL/hr over 30 Minutes Intravenous On call to O.R. 11/04/21 2027 11/04/21 2237   11/04/21 2115   gentamicin (GARAMYCIN) 260 mg in dextrose 5 % 100 mL IVPB       See Hyperspace for full Linked Orders Report.   5 mg/kg  52.6 kg 106.5 mL/hr over 60 Minutes Intravenous On call to O.R. 11/04/21 2027 11/04/21 2216   11/04/21 1945  piperacillin-tazobactam (ZOSYN) IVPB 3.375 g        3.375 g 100 mL/hr over 30 Minutes Intravenous  Once 11/04/21 1940 11/04/21 2050       Subjective: No new complaints  Objective: Vitals:   11/06/21 0423 11/06/21 0631 11/06/21 0956 11/06/21 1330  BP: 96/67 105/70 102/63 (!) 100/57  Pulse:  71 80 76  Resp:  17  16  Temp:  98.2 F (36.8 C) 98.4 F (36.9 C) 99.3 F (37.4 C)  TempSrc:  Oral Oral Oral  SpO2:  99% 100% 99%  Weight:      Height:        Intake/Output Summary (Last 24 hours) at 11/06/2021 1724 Last data filed at 11/06/2021 1711 Gross per 24 hour  Intake 148.58 ml  Output 1850 ml  Net -1701.42 ml   Filed Weights   11/04/21 1709  Weight: 52.6 kg    Examination:  General: No acute distress. Cardiovascular: RRR Lungs: unlabored Abdomen: L groin distension, abd soft, mild tenderness, surgical incisions well appearing  Neurological: Alert and oriented 3. Moves all extremities 4 . Cranial nerves II through XII grossly intact. Skin: Warm and dry. No rashes or lesions. Extremities: No clubbing or cyanosis. No edema.  Data Reviewed: I have personally reviewed following labs and imaging studies  CBC: Recent Labs  Lab 11/04/21 1739 11/04/21 1746 11/05/21 0430 11/05/21 1341 11/05/21 2113 11/06/21 0520  WBC 12.9*  --  17.5* 14.0*  --  11.9*  NEUTROABS 11.3*  --   --   --   --   --   HGB 15.2* 16.0* 12.1 11.7* 11.7* 11.2*  HCT 44.6 47.0* 35.8* 34.9* 34.9* 33.0*  MCV 88.7  --  91.1 91.4  --  91.9  PLT 341  --  295 309  --  257    Basic Metabolic Panel: Recent Labs  Lab 11/04/21 1739 11/04/21 1746 11/05/21 0430 11/06/21 0520 11/06/21 1559  NA 129* 130* 130* 127* 131*  K 3.7 3.8 3.6 4.7 4.4  CL 93* 94* 98 95* 99  CO2  24  --  24 25 22   GLUCOSE 129* 131* 163* 125* 109*  BUN 18 16 14  24* 17  CREATININE 0.64 0.60 0.60 1.08* 0.57  CALCIUM 10.0  --  8.5* 8.6* 8.2*  MG  --   --  1.5* 2.8*  --   PHOS  --   --   --  3.4  --     GFR: Estimated Creatinine Clearance: 45.1 mL/min (by C-G formula based on SCr of 0.57 mg/dL).  Liver Function Tests: Recent Labs  Lab 11/06/21 0520  AST 28  ALT 17  ALKPHOS 37*  BILITOT 1.9*  PROT 6.0*  ALBUMIN 3.4*    CBG: No results for input(s): GLUCAP in the last 168 hours.   Recent Results (from the past 240 hour(s))  Blood culture (routine x 2)     Status: None (Preliminary result)   Collection Time: 11/04/21  5:39 PM   Specimen: BLOOD  Result Value Ref Range  Status   Specimen Description   Final    BLOOD LEFT ANTECUBITAL Performed at [facility], [address] [place], [place] [address]    Special Requests   Final    BOTTLES DRAWN AEROBIC AND ANAEROBIC Blood Culture adequate volume Performed at [facility], [address], [place], [place] [address]    Culture   Final    NO GROWTH 2 DAYS Performed at [facility], [address], [place], [place] [address]    Report Status PENDING  Incomplete  Urine Culture     Status: Abnormal (Preliminary result)   Collection Time: 11/04/21  6:47 PM   Specimen: Urine, Catheterized  Result Value Ref Range Status   Specimen Description   Final    URINE, CATHETERIZED Performed at [facility], [address] [place], [place] [address]    Special Requests   Final    NONE Performed at [facility], [address], [place], [place] [address]    Culture (Danessa Mensch)  Final    >=100,000 COLONIES/mL GRAM NEGATIVE RODS >=100,000 COLONIES/mL PSEUDOMONAS AERUGINOSA    Report Status PENDING  Incomplete  Resp Panel by RT-PCR (Flu Yadiel Aubry&B, Covid)     Status: Abnormal   Collection Time: 11/04/21  6:51 PM   Specimen: Nasopharyngeal(NP) swabs in vial  transport medium  Result Value Ref Range Status   SARS Coronavirus 2 by RT PCR POSITIVE (Jaxon Mynhier) NEGATIVE Final    Comment: (NOTE) SARS-CoV-2 target nucleic acids are DETECTED.  The SARS-CoV-2 RNA is generally detectable in upper respiratory specimens during the acute phase of infection. Positive results are indicative of the presence of the identified virus, but do not rule out bacterial infection or co-infection with other pathogens not detected by the test. Clinical correlation with patient history and other diagnostic information is necessary to determine patient infection status. The expected result is Negative.  Fact Sheet for Patients: BloggerCourse.com  Fact Sheet for Healthcare Providers: SeriousBroker.it  This test is not yet approved or cleared by the [place] FDA and  has been authorized for detection and/or diagnosis of SARS-CoV-2 by FDA under an Emergency Use Authorization (EUA).  This EUA will remain in effect (meaning this test can be used) for the duration of  the COVID-19 declaration under Section 564(b)(1) of the [place], 21 U.S.C. section 360bbb-3(b)(1), unless the authorization is terminated or revoked sooner.     Influenza [place] by PCR NEGATIVE NEGATIVE Final   Influenza B by PCR NEGATIVE NEGATIVE Final    Comment: (NOTE) The Xpert Xpress SARS-CoV-2/FLU/RSV plus assay is intended as an aid in the diagnosis of influenza from Nasopharyngeal swab specimens and should not be used as Millisa Giarrusso sole basis for treatment. Nasal washings and aspirates are unacceptable for Xpert Xpress SARS-CoV-2/FLU/RSV testing.  Fact Sheet for Patients: BloggerCourse.com  Fact Sheet for Healthcare Providers: SeriousBroker.it  This test is not yet approved or cleared by the [place] FDA and has been authorized for detection and/or diagnosis of SARS-CoV-2 by FDA under an Emergency Use  Authorization (EUA). This EUA will remain in effect (meaning this test can be used) for the duration of the COVID-19 declaration under Section 564(b)(1) of the Act, 21 U.S.C. section 360bbb-3(b)(1), unless the authorization is terminated or revoked.  Performed at [facility], [address], [place], [place] [address]          Radiology Studies: CT ABDOMEN PELVIS W CONTRAST  Result Date: 11/04/2021 CLINICAL DATA:  Abdominal pain with inguinal hernia. EXAM: CT ABDOMEN AND PELVIS WITH CONTRAST TECHNIQUE: Multidetector CT imaging of the abdomen and pelvis was performed using the standard protocol following bolus administration of intravenous contrast. RADIATION DOSE REDUCTION: This exam was performed according to the departmental dose-optimization program which includes automated exposure control, adjustment of the mA and/or kV according to patient size and/or use of iterative reconstruction technique. CONTRAST:  OMNIPAQUE IOHEXOL 300 MG/ML  SOLN COMPARISON:  Comparison is made with 03/26/2021. FINDINGS: Lower chest: Lung bases are clear. No effusion, no consolidative changes. Hepatobiliary: Liver with smooth contours. The portal vein is patent. No focal, suspicious hepatic lesion. No pericholecystic stranding. No substantial biliary duct distension. Pancreas: Signs of pancreatic atrophy without signs of adjacent inflammation. Spleen: Normal. Adrenals/Urinary Tract: Adrenal glands are unremarkable. Symmetric renal enhancement. No sign of hydronephrosis. No suspicious renal lesion or perinephric stranding. Urinary bladder is grossly unremarkable. Stomach/Bowel: Small hiatal hernia. Large periampullary duodenal diverticulum. Marked small bowel distension with herniation of multiple loops of bowel at different levels into Brynnlee Cumpian large LEFT groin hernia. There hernia contains small bowel loops which show obstruction within the hernia sac and substantial fluid within the hernia sac.  Stranding in the mesentery. Aperture of the hernia is approximately 4.3 cm as before. The hernia compresses the LEFT femoral vein and passes lateral and inferior to the pubic tubercle. Upstream loops of bowel are markedly dilated in the abdomen. There is mesenteric distortion without frank signs of mesenteric twist. Small amounts of interloop fluid are developing in the abdomen. Within the abdomen bowel loops also show Mirta Mally closed loop type configuration with several in bound and out bound loops of bowel showing varying degrees of dilation. Most dilated loop of bowel at approximately 3.6 cm. Upstream loops are less dilated than the intra-abdominal closed loop which is located in the LEFT lower quadrant. Upstream proximal ileal and or jejunal loops can be tracked back to collapsed bowel in the upper abdomen though the transition is more gradual than the transition of the intra-abdominal closed loop obstruction. Vascular/Lymphatic: Calcified atheromatous plaque of the abdominal aorta tracking in the iliac vessels. There is no gastrohepatic or hepatoduodenal ligament lymphadenopathy. No retroperitoneal or mesenteric lymphadenopathy. No pelvic sidewall lymphadenopathy. Compression of mesenteric veins can be seen abruptly at the level of the hernia sac with marked change in caliber. Reproductive: Pelvic organ prolapse, complex 3 compartment prolapse of pelvic viscera worse in the anterior middle compartments with inversion of the uterus. Urinary bladder is well below the symphysis pubis within prolapsed organs and the Foley catheter passes inferiorly from the urethral orifice into the urinary bladder. No gross adnexal mass. Streak artifact limits assessment of pelvic viscera including bowel loops in the pelvis. Other: No frank ascites within the abdomen but with signs of mesenteric edema and developing interloop fluid. Also with large amount of fluid in the hernia sac adjacent to dilated loops of bowel with signs of  mesenteric edema. No pneumoperitoneum or pneumatosis at this time. Musculoskeletal: Spinal degenerative changes. No acute or destructive bone process. RIGHT hip arthroplasty with streak artifact that limits assessment of pelvic structures. IMPRESSION: 1. LEFT groin hernia with features that favor femoral hernia over inguinal hernia but with signs of closed loop obstruction both within the abdomen and within the hernia sac with multiple loops of bowel which are herniated into the hernia at multiple levels, involving proximal ileal or distal jejunal loops as well as distal and mid ileal loops with multiple sites of transition. 2. Based on  appearance this is highly suspicious for incarcerated hernia. Surgical consultation is advised. 3. No current signs of pneumatosis but with developing interloop fluid and with moderately large amount of fluid in the hernia sac which is suspicious for at risk bowel in the setting of closed loop obstruction. 4. Compression of mesenteric veins further raises concern for potential for developing bowel ischemia. Bowel continues to enhance on the current exam. 5. Signs of complex pelvic organ prolapse as before. Aortic Atherosclerosis (ICD10-I70.0). Critical Value/emergent results were called by telephone at the time of interpretation on 11/04/2021 at 6:59 pm to provider DAVID YAO , who verbally acknowledged these results. Electronically Signed   By: Donzetta Kohut M.D.   On: 11/04/2021 19:01      Scheduled Meds:  acetaminophen  1,000 mg Oral Q6H   atorvastatin  20 mg Oral Daily   bisacodyl  10 mg Rectal Daily   Chlorhexidine Gluconate Cloth  6 each Topical Once   cholecalciferol  2,000 Units Oral Daily   feeding supplement  237 mL Oral Q24H   gabapentin  100 mg Oral BID   lip balm  1 application Topical BID   metoprolol tartrate  12.5 mg Oral BID   multivitamin with minerals  1 tablet Oral Daily   polycarbophil  625 mg Oral BID   Continuous Infusions:  heparin 750  Units/hr (11/06/21 1100)     LOS: 2 days    Time spent: over 30 min    Lacretia Nicks, MD [facility]   To contact the attending provider between 7A-7P or the covering provider during after hours 7P-7A, please log into the web site [email or web address] and access using universal  password for that web site. If you do not have the password, please call the hospital operator.  11/06/2021, 5:24 PM

## 2021-11-06 NOTE — Progress Notes (Signed)
ANTICOAGULATION CONSULT NOTE - Initial Consult  Pharmacy Consult for heparin, on warfarin PTA Indication: atrial fibrillation  Allergies  Allergen Reactions   Lisinopril Cough    Patient Measurements: Height: 5\' 1"  (154.9 cm) Weight: 52.6 kg (116 lb) IBW/kg (Calculated) : 47.8 Heparin Dosing Weight: 52.6  Vital Signs: Temp: 98.2 F (36.8 C) (02/24 0631) Temp Source: Oral (02/24 0631) BP: 105/70 (02/24 0631) Pulse Rate: 71 (02/24 0631)  Labs: Recent Labs    11/04/21 1739 11/04/21 1746 11/05/21 0430 11/05/21 1341 11/05/21 2113 11/06/21 0520  HGB 15.2* 16.0* 12.1 11.7* 11.7* 11.2*  HCT 44.6 47.0* 35.8* 34.9* 34.9* 33.0*  PLT 341  --  295 309  --  257  LABPROT 28.8*  --  15.2  --   --  14.1  INR 2.7*  --  1.2  --   --  1.1  CREATININE 0.64 0.60 0.60  --   --  1.08*    Estimated Creatinine Clearance: 33.4 mL/min (A) (by C-G formula based on SCr of 1.08 mg/dL (H)).   Medical History: Past Medical History:  Diagnosis Date   Arthritis    Dysrhythmia    afib   GERD (gastroesophageal reflux disease)    Hypercholesterolemia    Hypertension    Lumbar degenerative disc disease    Miscarriage    times 3   Osteopenia    Seasonal allergies    Varicose vein    right leg   Vitamin D deficiency     Medications:  Scheduled:   acetaminophen  1,000 mg Oral Q6H   atorvastatin  20 mg Oral Daily   bisacodyl  10 mg Rectal Daily   Chlorhexidine Gluconate Cloth  6 each Topical Once   cholecalciferol  2,000 Units Oral Daily   gabapentin  100 mg Oral BID   lip balm  1 application Topical BID   metoprolol tartrate  12.5 mg Oral BID   polycarbophil  625 mg Oral BID    Assessment: 76 yo female presented to [facility] ED on 2/22 with lower abdominal pain, nausea, and vomiting, found to have recurrent left femoral hernia incarcerated with small bowel and causing obstruction.  Patient is on warfarin PTA for atrial fibrillation.  INR 2.7 on admit. Due to emergent need for surgery,  warfarin was reversed with IV Vitamin K and Kcentra on 2/22.  S/p lysis of adhesions and reduction of recurrent hernia on 2/23.  Pharmacy is consulted to start a heparin drip with no bolus on 2/24 while warfarin continues to be on hold.  PTA warfarin dosing: 5mg  daily  Today, 11/06/21 - INR subtherapeutic at 1.1 - Hemoglobin remains stable 11's; plts stable wnl  Goal of Therapy:  INR 2-3 Heparin level 0.3-0.7 units/ml Monitor platelets by anticoagulation protocol: Yes   Plan:  Start heparin drip at 750 units/hr F/u heparin level in 8 hours Monitor daily INR, CBC, and heparin level F/u ability to resume warfarin  Dimple Nanas, PharmD 11/06/2021 9:00 AM

## 2021-11-07 ENCOUNTER — Inpatient Hospital Stay (HOSPITAL_COMMUNITY): Payer: Medicare Other

## 2021-11-07 ENCOUNTER — Encounter (HOSPITAL_COMMUNITY): Payer: Self-pay

## 2021-11-07 DIAGNOSIS — I639 Cerebral infarction, unspecified: Secondary | ICD-10-CM | POA: Insufficient documentation

## 2021-11-07 DIAGNOSIS — K4190 Unilateral femoral hernia, without obstruction or gangrene, not specified as recurrent: Secondary | ICD-10-CM | POA: Diagnosis not present

## 2021-11-07 LAB — BASIC METABOLIC PANEL
Anion gap: 5 (ref 5–15)
Anion gap: 7 (ref 5–15)
BUN: 10 mg/dL (ref 8–23)
BUN: 9 mg/dL (ref 8–23)
CO2: 26 mmol/L (ref 22–32)
CO2: 27 mmol/L (ref 22–32)
Calcium: 8.3 mg/dL — ABNORMAL LOW (ref 8.9–10.3)
Calcium: 8.6 mg/dL — ABNORMAL LOW (ref 8.9–10.3)
Chloride: 98 mmol/L (ref 98–111)
Chloride: 97 mmol/L — ABNORMAL LOW (ref 98–111)
Creatinine, Ser: 0.49 mg/dL (ref 0.44–1.00)
Creatinine, Ser: 0.53 mg/dL (ref 0.44–1.00)
GFR, Estimated: >60 mL/min (ref >60)
GFR, Estimated: >60 mL/min (ref >60)
Glucose, Bld: 103 mg/dL — ABNORMAL HIGH (ref 70–99)
Glucose, Bld: 154 mg/dL — ABNORMAL HIGH (ref 70–99)
Potassium: 3.8 mmol/L (ref 3.5–5.1)
Potassium: 4 mmol/L (ref 3.5–5.1)
Sodium: 129 mmol/L — ABNORMAL LOW (ref 135–145)
Sodium: 131 mmol/L — ABNORMAL LOW (ref 135–145)

## 2021-11-07 LAB — CBC
HCT: 33.6 % — ABNORMAL LOW (ref 36.0–46.0)
Hemoglobin: 10.9 g/dL — ABNORMAL LOW (ref 12.0–15.0)
MCH: 30.3 pg (ref 26.0–34.0)
MCHC: 32.4 g/dL (ref 30.0–36.0)
MCV: 93.3 fL (ref 80.0–100.0)
Platelets: 206 10*3/uL (ref 150–400)
RBC: 3.6 MIL/uL — ABNORMAL LOW (ref 3.87–5.11)
RDW: 13.4 % (ref 11.5–15.5)
WBC: 10.2 10*3/uL (ref 4.0–10.5)
nRBC: 0 % (ref 0.0–0.2)

## 2021-11-07 LAB — URINE CULTURE: Culture: >=100000 — AB

## 2021-11-07 LAB — PROTIME-INR
INR: 1.2 (ref 0.8–1.2)
Prothrombin Time: 15.6 seconds — ABNORMAL HIGH (ref 11.4–15.2)

## 2021-11-07 LAB — HEPARIN LEVEL (UNFRACTIONATED)
Heparin Unfractionated: 0.38 IU/mL (ref 0.30–0.70)
Heparin Unfractionated: 0.29 IU/mL — ABNORMAL LOW (ref 0.30–0.70)

## 2021-11-07 MED ORDER — METOPROLOL TARTRATE 25 MG PO TABS: 25.0000 mg | ORAL_TABLET | Freq: Two times a day (BID) | ORAL | Status: DC

## 2021-11-07 MED ORDER — ASPIRIN 81 MG PO CHEW: 81.0000 mg | CHEWABLE_TABLET | Freq: Every day | ORAL | Status: DC

## 2021-11-07 MED ORDER — HEPARIN BOLUS VIA INFUSION
1000.0000 [IU] | Freq: Once | INTRAVENOUS | Status: AC
  Administered 2021-11-07: 1000 [IU] via INTRAVENOUS
  Filled 2021-11-07: qty 1000

## 2021-11-07 MED ORDER — FAMOTIDINE 20 MG PO TABS
20.0000 mg | ORAL_TABLET | Freq: Every day | ORAL | Status: DC
  Administered 2021-11-07 – 2021-11-09 (×3): 20 mg via ORAL
  Filled 2021-11-07 (×3): qty 1

## 2021-11-07 MED ORDER — METOPROLOL TARTRATE 12.5 MG HALF TABLET
12.5000 mg | ORAL_TABLET | Freq: Two times a day (BID) | ORAL | Status: DC
  Administered 2021-11-07 – 2021-11-09 (×4): 12.5 mg via ORAL
  Filled 2021-11-07 (×4): qty 1

## 2021-11-07 MED ORDER — SODIUM CHLORIDE 0.9 % IV SOLN: INTRAVENOUS | Status: DC

## 2021-11-07 MED ORDER — WARFARIN - PHARMACIST DOSING INPATIENT: Freq: Every day | Status: DC

## 2021-11-07 MED ORDER — ATORVASTATIN CALCIUM 80 MG PO TABS
80.0000 mg | ORAL_TABLET | Freq: Every day | ORAL | Status: DC
Start: 2021-11-08 — End: 2021-11-09
  Administered 2021-11-08 – 2021-11-09 (×2): 80 mg via ORAL
  Filled 2021-11-07 (×2): qty 1

## 2021-11-07 MED ORDER — IOHEXOL 350 MG/ML SOLN
75.0000 mL | Freq: Once | INTRAVENOUS | Status: AC | PRN
  Administered 2021-11-07: 75 mL via INTRAVENOUS

## 2021-11-07 MED ORDER — ASPIRIN 325 MG PO TABS: 325.0000 mg | ORAL_TABLET | Freq: Once | ORAL | Status: DC

## 2021-11-07 MED ORDER — HEPARIN (PORCINE) 25000 UT/250ML-% IV SOLN
1100.0000 [IU]/h | INTRAVENOUS | Status: DC
  Administered 2021-11-08: 1100 [IU]/h via INTRAVENOUS
  Filled 2021-11-07 (×2): qty 250

## 2021-11-07 MED ORDER — WARFARIN SODIUM 5 MG PO TABS
7.5000 mg | ORAL_TABLET | Freq: Once | ORAL | Status: AC
  Administered 2021-11-07: 7.5 mg via ORAL
  Filled 2021-11-07: qty 1

## 2021-11-07 MED ORDER — STROKE: EARLY STAGES OF RECOVERY BOOK: Freq: Once | Status: AC

## 2021-11-07 NOTE — Assessment & Plan Note (Signed)
pepcid

## 2021-11-07 NOTE — Assessment & Plan Note (Addendum)
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  Neuro c/s.  CTA head/neck reviewed with the patient and family.  Pt currently on IV heparin, transition to eliquis tomorrow.  Currently waiting for benefits check.  Echocardiogram done, results pending.    Lipid panel showed LDl is 43, a1c is 5.4% Continue with statin.  Therapy evaluations done.  Neurology on board.

## 2021-11-07 NOTE — Progress Notes (Signed)
ANTICOAGULATION CONSULT NOTE - Follow Up Consult  Pharmacy Consult for Heparin Indication: atrial fibrillation (PTA warfarin), new stroke   Allergies  Allergen Reactions   Lisinopril Cough    Patient Measurements: Height: 5\' 1"  (154.9 cm) Weight: 52.6 kg (116 lb) IBW/kg (Calculated) : 47.8 Heparin Dosing Weight: 52.6 kg  Vital Signs: Temp: 98 F (36.7 C) (02/25 1428) Temp Source: Oral (02/25 1428) BP: 141/91 (02/25 1428) Pulse Rate: 77 (02/25 1428)  Labs: Recent Labs    11/05/21 0430 11/05/21 1341 11/05/21 2113 11/06/21 0520 11/06/21 1559 11/06/21 1855 11/07/21 0714 11/07/21 1558  HGB 12.1 11.7* 11.7* 11.2*  --   --  10.9*  --   HCT 35.8* 34.9* 34.9* 33.0*  --   --  33.6*  --   PLT 295 309  --  257  --   --  206  --   LABPROT 15.2  --   --  14.1  --   --  15.6*  --   INR 1.2  --   --  1.1  --   --  1.2  --   HEPARINUNFRC  --   --   --   --   --  0.12* 0.38 0.29*  CREATININE 0.60  --   --  1.08* 0.57  --  0.49 0.53     Estimated Creatinine Clearance: 45.1 mL/min (by C-G formula based on SCr of 0.53 mg/dL).   Medications:  Infusions:   sodium chloride Stopped (11/07/21 1655)   heparin 1,000 Units/hr (11/07/21 1744)    Assessment: 76 yo female presented to [facility] ED on 2/22 with lower abdominal pain, nausea, and vomiting, found to have recurrent left femoral hernia incarcerated with small bowel and causing obstruction.  Patient is on warfarin PTA for atrial fibrillation.  INR 2.7 on admit. Due to emergent need for surgery, warfarin was reversed with IV Vitamin K and Kcentra on 2/22.  S/p lysis of adhesions and reduction of recurrent hernia on 2/23.  Pharmacy is consulted to start a heparin drip with no bolus on 2/24 while warfarin continues to be on hold.   PTA warfarin dosing: 5mg  daily   Today, 11/07/2021:  - 2/25 MRI: Small acute cortical/subcortical left frontoparietal infarct involving the perirolandic region. - Most recent HL 0.29, slightly sub-therapeutic  and heparin was increased to 1000 units/hr.  Note low goal (0.3-0.5 for stroke) - INR subtherapeutic at 1.2 (warfarin resumed 2/25, but now on hold d/t stroke) - Hemoglobin remains low/stable 10.9; plts stable wnl  - RN reports no bleeding or complications  Goal of Therapy:  INR 2-3 Heparin level 0.3-0.5 units/ml Monitor platelets by anticoagulation protocol: Yes   Plan:  Continue heparin IV infusion at 1000 units/hr Confirmatory Heparin level in 8 hours Daily heparin level and CBC Follow up plans for warfarin  Gretta Arab PharmD, BCPS Clinical Pharmacist [facility] main pharmacy [phone] 11/07/2021 7:15 PM

## 2021-11-07 NOTE — Progress Notes (Signed)
At 1941, patient brought to CT scan, patient alert oriented without any deficits.

## 2021-11-07 NOTE — Progress Notes (Addendum)
Consult NOTE    Hannah Downs  DGU:[id number] DOB: 08/21/1946 DOA: 11/04/2021 PCP: [name], MD  Chief Complaint  Patient presents with   Hernia    Brief Narrative:   Hannah Downs is Hannah Downs 76 y.o. female with past medical history of paroxysmal atrial fibrillation on Coumadin and left inguinal hernia who presents to the emergency department with lower abdominal pain, nausea, and vomiting.  She was found to have Hannah Downs recurrent left femoral hernia incarcerated with small bowel and causing obstruction.  She was given vitamin k and k centra prior to surgery.  She's now s/p surgery on 2/23.    Her hospital course was complicated by atrial fibrillation with RVR.  The hospitalist service was consulted to help with her RVR.  She's now converted back to sinus rhythm, but on 2/24 she developed right sided facial numbness and right arm numbness.  She told the nurse on 2/25 AM and she had Greg Eckrich head CT which was negative.  Borden Thune subsequent MRI showed Fleurette Woolbright stroke.  She's being transferred to [facility] for stroke evaluation on 2/25.  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/05/2021   Assessment and Plan: * Femoral hernia of left side Presented with L femoral hernia which was incarcerated with small bowel and causing obstruction Now s/p laparoscopic lysis of adhesions, reduction of  incarcerated recurrent left femoral hernia, laparoscopic repair of incarcerated recurrent left femoral hernia, laparascopic repiar of right femoral hernia, laparoscopic repair of right obturator hernia, TAP block bilateral Post op course per surgery  Stroke (HCC) Looks like she complained of facial numbness and R hand numbness since I saw her Head CT negative, will evaluate  Addendum: she notes right lower facial numbness and right forearm numbness which started last night around 9ish, also having some coordination issues with R arm when she's grabbing things (she's left handed).  Neuro exam CN 2-12 intact, 5/5 strength to upper and lower extremities, no sensory deficits to light touch, but she's able to point out the areas that feel different on her face and right forearm.  Plan for stat MRI at this time.  Addendum: MRI with small acute cortical/subcortical left frontoparietal infarct.  Neuro c/s.  CTA head/neck.  will discuss anticoagulation with neurology, continue heparin gtt for now.  Lipid panel, a1c.  Increase statin.  PT/OT/SLP.  Permissive hypertension.   Addendum: discussed with neurology, will transfer to [facility].  No aspirin, continue heparin gtt.  Will remove warfarin order until neurology evaluates.  Paroxysmal atrial fibrillation with RVR (HCC)- (present on admission) Sinus at presentation, developed RVR in OR Now back in sinus Continue  (uptitrae back to home dose), rate improved at this time (holding parameters for rate and BP) S/p 10 mg vit K and K centra in ED Will resume warfarin today if ok with surgery, continue heparin bridge Consider resumption of warfarin tomorrow per  surgery chadvasc is 4 (for age, female, hypertension - denies hx MI or stroke, echo in 2021 with normal EF, grade II diastolic dysfunction).  She's on warfarin due to cost of doacs.  Can continue heparin bridging while she's requiring inpatient care.  Once ready for discharge, can discharge on warfarin without  bridge.  Will need close follow up with INR clinic.  Addendum: with acute stroke, chadvasc 6, would bridge at discharge or look into doac cost.   Asymptomatic bacteriuria Has chronic indwelling catheter No fever or clear UTI symptoms Growing >100,000 pseudomonas and >100,000 klebsiella - will treat these as asymptomatic bacteruria unless she develops symptoms  Hyponatremia- (present on admission) Mild.  With low urine sodium and exam, suspect hypovolemic Continue IVF and follow HCTZ could exacerbate   COVID-19 virus detected- (present on admission) She reports testing positive on [facility] home test on January 11th She's asymptomatic, without any new symptoms Isolation can be discontinued given within 90 days of recent infection   Hypomagnesemia Replaced  Essential hypertension- (present on admission) BP better today, using appropriately sized cuff now Hold HCTZ for now Continue metop with holding parameters  Leukocytosis Related to above, follow  High bilirubin Mild, follow  Hyperlipidemia- (present on admission) statin  Gastroesophageal reflux disease without esophagitis- (present on admission) pepcid   DVT prophylaxis: SCD Code Status: ful Family Communication: none  Disposition: per surgery   Consultants:  General surgery is primary service TR[facility] is primary  Procedures:  2/23 Laparoscopic lysis of adhesions  Reduction of incarcerated recurrent left femoral hernia  Laparoscopic repair of incarcerated recurrent left femoral hernia   Laparoscopic repair of right femoral hernia  Laparoscopic repair of right obturator hernia TRANSVERSUS ABDOMINIS PLANE (TAP) BLOCK - BILATERAL  Antimicrobials:  Anti-infectives (From admission, onward)    Start     Dose/Rate Route Frequency Ordered Stop   11/05/21 0600  ceFAZolin (ANCEF) IVPB 2g/100 mL premix        2 g 200 mL/hr over 30 Minutes Intravenous Every 8 hours 11/05/21 0247 11/05/21 1421   11/04/21 2130  clindamycin  (CLEOCIN) IVPB 900 mg       See Hyperspace for full Linked Orders Report.   900 mg 100 mL/hr over 30 Minutes Intravenous On call to O.R. 11/04/21 2027 11/04/21 2237   11/04/21 2115  gentamicin (GARAMYCIN) 260 mg in dextrose 5 % 100 mL IVPB       See Hyperspace for full Linked Orders Report.   5 mg/kg  52.6 kg 106.5 mL/hr over 60 Minutes Intravenous On call to O.R. 11/04/21 2027 11/04/21 2216   11/04/21 1945  piperacillin-tazobactam (ZOSYN) IVPB 3.375 g        3.375 g 100 mL/hr over 30 Minutes Intravenous  Once 11/04/21 1940 11/04/21 2050       Subjective: No new complaints  Objective: Vitals:   11/06/21 2035 11/07/21 0438 11/07/21 1007 11/07/21 1428  BP: 128/74 126/82 139/89 (!) 141/91  Pulse: 78 90 94 77  Resp: 18 18  14   Temp: 98.4 F (36.9 C) 97.9 F (36.6 C)  98 F (36.7 C)  TempSrc: Oral Oral  Oral  SpO2: 97% 97%  98%  Weight:      Height:        Intake/Output Summary (Last 24 hours) at 11/07/2021 1901 Last data filed at 11/07/2021 1826 Gross per 24 hour  Intake 338.61 ml  Output 1900 ml  Net -1561.39 ml   Filed Weights   11/04/21 1709  Weight: 52.6 kg  Examination:  General: No acute distress. Cardiovascular: RRR Lungs: Clear to auscultation bilaterally  Abdomen: appropriately TTP, L groin bulge Neurological: Alert and oriented 3. Moves all extremities 4. Cranial nerves II through XII grossly intact. Skin: Warm and dry. No rashes or lesions. Extremities: No clubbing or cyanosis. No edema.   Data Reviewed: I have personally reviewed following labs and imaging studies  CBC: Recent Labs  Lab 11/04/21 1739 11/04/21 1746 11/05/21 0430 11/05/21 1341 11/05/21 2113 11/06/21 0520 11/07/21 0714  WBC 12.9*  --  17.5* 14.0*  --  11.9* 10.2  NEUTROABS 11.3*  --   --   --   --   --   --   HGB 15.2*   < > 12.1 11.7* 11.7* 11.2* 10.9*  HCT 44.6   < > 35.8* 34.9* 34.9* 33.0* 33.6*  MCV 88.7  --  91.1 91.4  --  91.9 93.3  PLT 341  --  295 309  --  257  206   < > = values in this interval not displayed.    Basic Metabolic Panel: Recent Labs  Lab 11/05/21 0430 11/06/21 0520 11/06/21 1559 11/07/21 0714 11/07/21 1558  NA 130* 127* 131* 129* 131*  K 3.6 4.7 4.4 3.8 4.0  CL 98 95* 99 98 97*  CO2 24 25 22 26 27   GLUCOSE 163* 125* 109* 103* 154*  BUN 14 24* 17 10 9   CREATININE 0.60 1.08* 0.57 0.49 0.53  CALCIUM 8.5* 8.6* 8.2* 8.3* 8.6*  MG 1.5* 2.8*  --   --   --   PHOS  --  3.4  --   --   --     GFR: Estimated Creatinine Clearance: 45.1 mL/min (by C-G formula based on SCr of 0.53 mg/dL).  Liver Function Tests: Recent Labs  Lab 11/06/21 0520  AST 28  ALT 17  ALKPHOS 37*  BILITOT 1.9*  PROT 6.0*  ALBUMIN 3.4*    CBG: No results for input(s): GLUCAP in the last 168 hours.   Recent Results (from the past 240 hour(s))  Blood culture (routine x 2)     Status: None (Preliminary result)   Collection Time: 11/04/21  5:39 PM   Specimen: BLOOD  Result Value Ref Range Status   Specimen Description   Final    BLOOD LEFT ANTECUBITAL Performed at [facility], [address] [place], [place] [address]    Special Requests   Final    BOTTLES DRAWN AEROBIC AND ANAEROBIC Blood Culture adequate volume Performed at [facility], [address] [place], [place] [address]    Culture   Final    NO GROWTH 3 DAYS Performed at [facility], [address], [place], [place] [address]    Report Status PENDING  Incomplete  Urine Culture     Status: Abnormal   Collection Time: 11/04/21  6:47 PM   Specimen: Urine, Catheterized  Result Value Ref Range Status   Specimen Description   Final    URINE, CATHETERIZED Performed at [facility], [address] [place], [place] [address]    Special Requests   Final    NONE Performed at [facility], [address] [place], [place] [address]    Culture (Dade Rodin)  Final    >=100,000 COLONIES/mL KLEBSIELLA  SPECIES >=100,000 COLONIES/mL PSEUDOMONAS AERUGINOSA    Report Status 11/07/2021 FINAL  Final   Organism ID, Bacteria KLEBSIELLA SPECIES (Teran Knittle)  Final   Organism ID, Bacteria  PSEUDOMONAS AERUGINOSA (Nevelyn Mellott)  Final      Susceptibility   Klebsiella species - MIC*    AMPICILLIN RESISTANT Resistant     CEFAZOLIN <=4 SENSITIVE Sensitive     CEFEPIME <=0.12 SENSITIVE Sensitive     CEFTRIAXONE <=0.25 SENSITIVE Sensitive     CIPROFLOXACIN <=0.25 SENSITIVE Sensitive     GENTAMICIN <=1 SENSITIVE Sensitive     IMIPENEM <=0.25 SENSITIVE Sensitive     NITROFURANTOIN 64 INTERMEDIATE Intermediate     TRIMETH/SULFA <=20 SENSITIVE Sensitive     AMPICILLIN/SULBACTAM 4 SENSITIVE Sensitive     PIP/TAZO <=4 SENSITIVE Sensitive     * >=100,000 COLONIES/mL KLEBSIELLA SPECIES   Pseudomonas aeruginosa - MIC*    CEFTAZIDIME 2 SENSITIVE Sensitive     CIPROFLOXACIN <=0.25 SENSITIVE Sensitive     GENTAMICIN <=1 SENSITIVE Sensitive     IMIPENEM 2 SENSITIVE Sensitive     PIP/TAZO <=4 SENSITIVE Sensitive     CEFEPIME 2 SENSITIVE Sensitive     * >=100,000 COLONIES/mL PSEUDOMONAS AERUGINOSA  Resp Panel by RT-PCR (Flu Rochell Mabie&B, Covid)     Status: Abnormal   Collection Time: 11/04/21  6:51 PM   Specimen: Nasopharyngeal(NP) swabs in vial transport medium  Result Value Ref Range Status   SARS Coronavirus 2 by RT PCR POSITIVE (Rolena Knutson) NEGATIVE Final    Comment: (NOTE) SARS-CoV-2 target nucleic acids are DETECTED.  The SARS-CoV-2 RNA is generally detectable in upper respiratory specimens during the acute phase of infection. Positive results are indicative of the presence of the identified virus, but do not rule out bacterial infection or co-infection with other pathogens not detected by the test. Clinical correlation with patient history and other diagnostic information is necessary to determine patient infection status. The expected result is Negative.  Fact Sheet for Patients: BloggerCourse.com  Fact  Sheet for Healthcare Providers: SeriousBroker.it  This test is not yet approved or cleared by the [place] FDA and  has been authorized for detection and/or diagnosis of SARS-CoV-2 by FDA under an Emergency Use Authorization (EUA).  This EUA will remain in effect (meaning this test can be used) for the duration of  the COVID-19 declaration under Section 564(b)(1) of the [place]nna Hus[place] ct, 21 U.S.C. section 360bbb-3(b)(1), unless the authorization is terminated or revoked sooner.     Influenza Sebastien Jackson by PCR NEGATIVE NEGATIVE Final   Influenza B by PCR NEGATIVE NEGATIVE Final    Comment: (NOTE) The Xpert Xpress SARS-CoV-2/FLU/RSV plus assay is intended as an aid in the diagnosis of influenza from Nasopharyngeal swab specimens and should not be used as Lizania Bouchard sole basis for treatment. Nasal washings and aspirates are unacceptable for Xpert Xpress SARS-CoV-2/FLU/RSV testing.  Fact Sheet for Patients: BloggerCourse.com  Fact Sheet for Healthcare Providers: SeriousBroker.it  This test is not yet approved or cleared by the [place] FDA and has been authorized for detection and/or diagnosis of SARS-CoV-2 by FDA under an Emergency Use Authorization (EUA). This EUA will remain in effect (meaning this test can be used) for the duration of the COVID-19 declaration under Section 564(b)(1) of the Act, 21 U.S.C. section 360bbb-3(b)(1), unless the authorization is terminated or revoked.  Performed at [facility], [address] [place], [place] [address]          Radiology Studies: CT HEAD WO CONTRAST ( )  Result Date: 11/07/2021 CLINICAL DATA:  Syncope/presyncope with cerebrovascular cause suspected. Facial and right arm numbness/tingling starting last night and recurring this morning. EXAM: CT HEAD WITHOUT CONTRAST TECHNIQUE: Contiguous axial images were obtained  from the base of the skull through the  vertex without intravenous contrast. RADIATION DOSE REDUCTION: This exam was performed according to the departmental dose-optimization program which includes automated exposure control, adjustment of the mA and/or kV according to patient size and/or use of iterative reconstruction technique. COMPARISON:  None. FINDINGS: Brain: No evidence of acute infarction, hemorrhage, hydrocephalus, extra-axial collection or mass lesion/mass effect. Vascular: No hyperdense vessel or unexpected calcification. Skull: Normal. Negative for fracture or focal lesion. Sinuses/Orbits: No acute finding. IMPRESSION: Negative head CT.  No explanation for symptoms. Electronically Signed   By: Tiburcio Pea M.D.   On: 11/07/2021 12:21   MR BRAIN WO CONTRAST  Result Date: 11/07/2021 CLINICAL DATA:  Neuro deficit, acute, stroke suspected EXAM: MRI HEAD WITHOUT CONTRAST TECHNIQUE: Multiplanar, multiecho pulse sequences of the brain and surrounding structures were obtained without intravenous contrast. COMPARISON:  None. FINDINGS: Brain: Cortical/subcortical mildly reduced diffusion in the left perirolandic frontoparietal lobes extending to the margin of the body left lateral ventricle. Kaleo Condrey focus of susceptibility inferior to this region likely reflects chronic microhemorrhage. There is no intracranial mass or mass effect. There is no hydrocephalus or extra-axial fluid collection. Prominence of the ventricles and sulci reflects minor parenchymal volume loss. Few patchy foci of T2 hyperintensity in the supratentorial white matter are nonspecific but may reflect minor chronic microvascular ischemic changes. Vascular: Major vessel flow voids at the skull base are preserved. Skull and upper cervical spine: Normal marrow signal is preserved. Sinuses/Orbits: Paranasal sinuses are aerated. Orbits are unremarkable. Other: Sella is unremarkable.  Mastoid air cells are clear. IMPRESSION: Small acute cortical/subcortical left frontoparietal infarct  involving the perirolandic region. Minor chronic microvascular ischemic changes. Electronically Signed   By: Guadlupe Spanish M.D.   On: 11/07/2021 17:54      Scheduled Meds:   stroke: mapping our early stages of recovery book   Does not apply Once   acetaminophen  1,000 mg Oral Q6H   [START ON 11/08/2021] atorvastatin  80 mg Oral Daily   bisacodyl  10 mg Rectal Daily   Chlorhexidine Gluconate Cloth  6 each Topical Once   cholecalciferol  2,000 Units Oral Daily   famotidine  20 mg Oral Daily   feeding supplement  237 mL Oral Q24H   gabapentin  100 mg Oral BID   lip balm  1 application Topical BID   metoprolol tartrate  12.5 mg Oral BID   multivitamin with minerals  1 tablet Oral Daily   polycarbophil  625 mg Oral BID   Continuous Infusions:  sodium chloride Stopped (11/07/21 1655)   heparin 1,000 Units/hr (11/07/21 1744)     LOS: 3 days    Time spent: over 30 min    Lacretia Nicks, MD [facility]   To contact the attending provider between 7A-7P or the covering provider during after hours 7P-7A, please log into the web site [email or web address] and access using universal [place] password for that web site. If you do not have the password, please call the hospital operator.  11/07/2021, 7:01 PM

## 2021-11-07 NOTE — Progress Notes (Addendum)
ANTICOAGULATION CONSULT NOTE - Follow Up Consult  Pharmacy Consult for Heparin Indication: atrial fibrillation (PTA warfarin)  Allergies  Allergen Reactions   Lisinopril Cough    Patient Measurements: Height: 5\' 1"  (154.9 cm) Weight: 52.6 kg (116 lb) IBW/kg (Calculated) : 47.8 Heparin Dosing Weight: 52.6 kg  Vital Signs: Temp: 97.9 F (36.6 C) (02/25 0438) Temp Source: Oral (02/25 0438) BP: 126/82 (02/25 0438) Pulse Rate: 90 (02/25 0438)  Labs: Recent Labs    11/05/21 0430 11/05/21 1341 11/05/21 2113 11/06/21 0520 11/06/21 1559 11/06/21 1855 11/07/21 0714  HGB 12.1 11.7* 11.7* 11.2*  --   --  10.9*  HCT 35.8* 34.9* 34.9* 33.0*  --   --  33.6*  PLT 295 309  --  257  --   --  206  LABPROT 15.2  --   --  14.1  --   --  15.6*  INR 1.2  --   --  1.1  --   --  1.2  HEPARINUNFRC  --   --   --   --   --  0.12* 0.38  CREATININE 0.60  --   --  1.08* 0.57  --  0.49    Estimated Creatinine Clearance: 45.1 mL/min (by C-G formula based on SCr of 0.49 mg/dL).   Medications:  Infusions:   heparin 900 Units/hr (11/06/21 2149)    Assessment: 76 yo female presented to [facility] ED on 2/22 with lower abdominal pain, nausea, and vomiting, found to have recurrent left femoral hernia incarcerated with small bowel and causing obstruction.  Patient is on warfarin PTA for atrial fibrillation.  INR 2.7 on admit. Due to emergent need for surgery, warfarin was reversed with IV Vitamin K and Kcentra on 2/22.  S/p lysis of adhesions and reduction of recurrent hernia on 2/23.  Pharmacy is consulted to start a heparin drip with no bolus on 2/24 while warfarin continues to be on hold.   PTA warfarin dosing: 5mg  daily   Today, 11/07/2021:  - Heparin level therapeutic at 0.38 on heparin 900 units/hr - INR subtherapeutic at 1.2 (warfarin remains on hold) - Hemoglobin remains low/stable 10.9; plts stable wnl  - RN reports no bleeding or complications  Goal of Therapy:  INR 2-3 Heparin level 0.3-0.7  units/ml Monitor platelets by anticoagulation protocol: Yes   Plan:  Continue heparin IV infusion at 900 units/hr Confirmatory Heparin level in 8 hours Daily heparin level and CBC Follow up plans for warfarin resumption when stable.    Gretta Arab PharmD, BCPS Clinical Pharmacist [facility] main pharmacy [phone] 11/07/2021 8:12 AM   Addendum: Warfarin: Pharmacy is consulted to resume warfarin dosing.   Warfarin 7.5 mg PO today at 1600. Per MD notes,  "Once ready for discharge, can discharge on warfarin without bridge.  Will need close follow up with INR clinic."  Heparin: Confirmatory Heparin level decreased to 0.29, just slightly subtherapeutic on heparin 900 units/hr RN reports no previous pauses or interruptions, but is currently stopped during MRI of brain for r/o stroke.   If negative for stroke when Heparin is restarted:  Give heparin 1000 units bolus IV x 1 Increase to heparin IV infusion at 1000 units/hr Heparin level in 8 hours   Gretta Arab PharmD, BCPS Clinical Pharmacist [facility] main pharmacy [phone] 11/07/2021 4:55 PM

## 2021-11-07 NOTE — Progress Notes (Signed)
Patient complaining of right facial numbness and right hand numbness during med pass at 1000. Smile symmetrical upon assessment. Right and left hands equal in grip strength. Slight right arm drift with extension but no other issues seen. Contacted Dr. Carolynne Edouard to inform of symptoms. Put order in for CT brain . Will continue to monitor.

## 2021-11-07 NOTE — Progress Notes (Signed)
3 Days Post-Op   Subjective/Chief Complaint: No complaints. Wants more food   Objective: Vital signs in last 24 hours: Temp:  [97.9 F (36.6 C)-99.3 F (37.4 C)] 97.9 F (36.6 C) (02/25 0438) Pulse Rate:  [76-90] 90 (02/25 0438) Resp:  [16-18] 18 (02/25 0438) BP: (100-128)/(57-82) 126/82 (02/25 0438) SpO2:  [97 %-100 %] 97 % (02/25 0438) Last BM Date : 11/04/21  Intake/Output from previous day: 02/24 0701 - 02/25 0700 In: 28.6 [I.V.:28.6] Out: 1900 [Urine:1900] Intake/Output this shift: No intake/output data recorded.  General appearance: alert and cooperative Resp: clear to auscultation bilaterally Cardio: regular rate and rhythm GI: soft, nontender. Still has reducible fluid in left groin  Lab Results:  Recent Labs    11/06/21 0520 11/07/21 0714  WBC 11.9* 10.2  HGB 11.2* 10.9*  HCT 33.0* 33.6*  PLT 257 206   BMET Recent Labs    11/06/21 1559 11/07/21 0714  NA 131* 129*  K 4.4 3.8  CL 99 98  CO2 22 26  GLUCOSE 109* 103*  BUN 17 10  CREATININE 0.57 0.49  CALCIUM 8.2* 8.3*   PT/INR Recent Labs    11/06/21 0520 11/07/21 0714  LABPROT 14.1 15.6*  INR 1.1 1.2   ABG No results for input(s): PHART, HCO3 in the last 72 hours.  Invalid input(s): PCO2, PO2  Studies/Results: No results found.  Anti-infectives: Anti-infectives (From admission, onward)    Start     Dose/Rate Route Frequency Ordered Stop   11/05/21 0600  ceFAZolin (ANCEF) IVPB 2g/100 mL premix        2 g 200 mL/hr over 30 Minutes Intravenous Every 8 hours 11/05/21 0247 11/05/21 1421   11/04/21 2130  clindamycin (CLEOCIN) IVPB 900 mg       See Hyperspace for full Linked Orders Report.   900 mg 100 mL/hr over 30 Minutes Intravenous On call to O.R. 11/04/21 2027 11/04/21 2237   11/04/21 2115  gentamicin (GARAMYCIN) 260 mg in dextrose 5 % 100 mL IVPB       See Hyperspace for full Linked Orders Report.   5 mg/kg  52.6 kg 106.5 mL/hr over 60 Minutes Intravenous On call to O.R.  11/04/21 2027 11/04/21 2216   11/04/21 1945  piperacillin-tazobactam (ZOSYN) IVPB 3.375 g        3.375 g 100 mL/hr over 30 Minutes Intravenous  Once 11/04/21 1940 11/04/21 2050       Assessment/Plan: s/p Procedure(s): lysis of adhesions reduction of incarcerated recurrent left femoral hernia laparoscopic repair. laparoscopic right femoral hernia repair and right obturator repair with mesh. tap block (N/A) Advance diet. Tolerating soft food. She can have whatever she can tolerate POD 2 S/p emergent laparoscopic LOA, reduction left femoral hernia, repair of left femoral hernia with mesh, repair of R femoral and R obturator hernias with mesh. 11/05/21 Dr. Michaell Cowing - Doing well. AF - Adv diet - Mobilize - Pulm toilet   Rapid atrial fibrillation, on coumadin at home - Hgb stable. INR 1.1. Okay to restart heparin gtt, no bolus   FEN: Soft diet, IVF per TRH VTE: SCDs, heparin gtt ID: Ancef periop. No further abx indicated from our standpoint Foley: chronic. F/u outpatient.    + UCx - > 100,000 gram neg rods. Final results pending. Defer tx to primary w/ chronic indwelling foley  AKI - Cr 1.08 from baseline of 0.6 COVID+ - previous + test. Not on precuations hyponatremia  HLD  HTN   LOS: 3 days    Hannah Downs  11/07/2021 ° °

## 2021-11-08 ENCOUNTER — Inpatient Hospital Stay (HOSPITAL_COMMUNITY): Payer: Medicare Other

## 2021-11-08 DIAGNOSIS — I639 Cerebral infarction, unspecified: Secondary | ICD-10-CM | POA: Diagnosis not present

## 2021-11-08 DIAGNOSIS — K4190 Unilateral femoral hernia, without obstruction or gangrene, not specified as recurrent: Secondary | ICD-10-CM | POA: Diagnosis not present

## 2021-11-08 DIAGNOSIS — Z7901 Long term (current) use of anticoagulants: Secondary | ICD-10-CM

## 2021-11-08 DIAGNOSIS — I6389 Other cerebral infarction: Secondary | ICD-10-CM

## 2021-11-08 LAB — CBC
HCT: 33.6 % — ABNORMAL LOW (ref 36.0–46.0)
Hemoglobin: 11.4 g/dL — ABNORMAL LOW (ref 12.0–15.0)
MCH: 31.1 pg (ref 26.0–34.0)
MCHC: 33.9 g/dL (ref 30.0–36.0)
MCV: 91.6 fL (ref 80.0–100.0)
Platelets: 272 10*3/uL (ref 150–400)
RBC: 3.67 MIL/uL — ABNORMAL LOW (ref 3.87–5.11)
RDW: 13.2 % (ref 11.5–15.5)
WBC: 10.3 10*3/uL (ref 4.0–10.5)
nRBC: 0 % (ref 0.0–0.2)

## 2021-11-08 LAB — BASIC METABOLIC PANEL
Anion gap: 9 (ref 5–15)
BUN: 7 mg/dL — ABNORMAL LOW (ref 8–23)
CO2: 25 mmol/L (ref 22–32)
Calcium: 8.8 mg/dL — ABNORMAL LOW (ref 8.9–10.3)
Chloride: 99 mmol/L (ref 98–111)
Creatinine, Ser: 0.53 mg/dL (ref 0.44–1.00)
GFR, Estimated: >60 mL/min (ref >60)
Glucose, Bld: 111 mg/dL — ABNORMAL HIGH (ref 70–99)
Potassium: 4.3 mmol/L (ref 3.5–5.1)
Sodium: 133 mmol/L — ABNORMAL LOW (ref 135–145)

## 2021-11-08 LAB — ECHOCARDIOGRAM COMPLETE
AV Mean grad: 4 mmHg
AV Peak grad: 8.1 mmHg
Ao pk vel: 1.42 m/s
Calc EF: 52 %
Height: 61 in
Single Plane A2C EF: 51.8 %
Single Plane A4C EF: 47.6 %
Weight: 1856 oz

## 2021-11-08 LAB — HEPARIN LEVEL (UNFRACTIONATED)
Heparin Unfractionated: 0.22 IU/mL — ABNORMAL LOW (ref 0.30–0.70)
Heparin Unfractionated: 0.39 IU/mL (ref 0.30–0.70)

## 2021-11-08 LAB — LIPID PANEL
Cholesterol: 109 mg/dL (ref 0–200)
HDL: 55 mg/dL (ref >40)
LDL Cholesterol: 43 mg/dL (ref 0–99)
Total CHOL/HDL Ratio: 2 RATIO
Triglycerides: 55 mg/dL (ref <150)
VLDL: 11 mg/dL (ref 0–40)

## 2021-11-08 LAB — HEMOGLOBIN A1C
Hgb A1c MFr Bld: 5.4 % (ref 4.8–5.6)
Mean Plasma Glucose: 108.28 mg/dL

## 2021-11-08 LAB — MAGNESIUM: Magnesium: 1.7 mg/dL (ref 1.7–2.4)

## 2021-11-08 NOTE — Progress Notes (Signed)
focus of susceptibility inferior to this region likely reflects chronic microhemorrhage. There is no intracranial mass or mass effect. There is no hydrocephalus or extra-axial fluid collection. Prominence of the ventricles and sulci reflects minor parenchymal volume loss. Few patchy foci of T2 hyperintensity in the supratentorial white matter are nonspecific but may reflect minor chronic microvascular ischemic changes. Vascular: Major vessel flow voids at the skull base are preserved. Skull and upper cervical spine: Normal marrow signal is preserved. Sinuses/Orbits: Paranasal sinuses are aerated. Orbits are unremarkable. Other: Sella is unremarkable.  Mastoid air cells are clear. IMPRESSION: Small acute cortical/subcortical left frontoparietal infarct involving the perirolandic region. Minor chronic microvascular ischemic changes. Electronically Signed   By: Macy Mis M.D.   On: 11/07/2021 17:54   DG CHEST PORT 1 VIEW  Result Date: 11/07/2021 CLINICAL DATA:  Abnormal CT scan. EXAM: PORTABLE CHEST 1 VIEW COMPARISON:  Lung apices from head and neck CTA earlier today. Lung bases from abdominopelvic CT 11/04/2021 FINDINGS: Subcutaneous emphysema visualized on neck CTA earlier today is faintly visualized in the right supraclavicular region. The left chest wall  soft tissue gas is not definitively seen. There is no evidence of pneumomediastinum. Lung volumes are low with minor bibasilar atelectasis no pneumothorax or pleural effusion. Prominent heart size not well assessed due to anti lordotic positioning. IMPRESSION: 1. Subcutaneous emphysema visualized on neck CTA earlier today is faintly visualized in the right supraclavicular region. The left chest wall soft tissue gas is not definitively seen. 2. Low lung volumes with bibasilar atelectasis. Electronically Signed   By: Keith Rake M.D.   On: 11/07/2021 23:45   ECHOCARDIOGRAM COMPLETE  Result Date: 11/08/2021    ECHOCARDIOGRAM REPORT   Patient Name:   Hannah Downs Date of Exam: 11/08/2021 Medical Rec #:  [id number]           Height:       61.0 in Accession #:    [id number]          Weight:       116.0 lb Date of Birth:  1946-07-18           BSA:          1.498 m Patient Age:    22 years            BP:           154/101 mmHg Patient Gender: F                   HR:           80 bpm. Exam Location:  Inpatient Procedure: 2D Echo, Cardiac Doppler and Color Doppler Indications:    CVA  History:        Patient has prior history of Echocardiogram examinations, most                 recent 04/01/2020. Risk Factors:Hypertension and Dyslipidemia.  Sonographer:    Luisa Hart RDCS Referring Phys: [phone] A CALDWELL POWELL JR  Sonographer Comments: No parasternal window, suboptimal apical window and suboptimal subcostal window. Image acquisition challenging due to patient body habitus. IMPRESSIONS  1. Technically difficult; no parasternal views.  2. Left ventricular ejection fraction, by estimation, is 60 to 65%. The left ventricle has normal function. The left ventricle has no regional wall motion abnormalities. Left ventricular diastolic parameters are consistent with Grade II diastolic dysfunction (pseudonormalization).  3. Right ventricular systolic function is normal. The right ventricular size is normal.  4. Left  atrial  [facility]                                                                               Hannah Downs, is a 76 y.o. female, DOB - [birth year], [id number] Admit date - 11/04/2021    Outpatient Primary MD for the patient is Harlan Stains, MD  LOS - 4  days    Brief summary    Hannah Downs is a 76 y.o. female with past medical history of paroxysmal atrial fibrillation on Coumadin and left inguinal hernia who presents to the emergency department with lower abdominal pain, nausea, and vomiting.  She was found to have a recurrent left femoral hernia incarcerated with small bowel and causing obstruction.  She was given vitamin k and k centra prior to surgery.  She's now s/p surgery on 2/23.    Her hospital course was complicated by atrial fibrillation with RVR.  The hospitalist service was consulted to help with her RVR.  She's now converted back to sinus rhythm, but on 2/24 she developed right sided facial numbness and right arm numbness.  She told the nurse on 2/25 AM and she had a head CT which was negative.  A subsequent MRI showed a stroke.  She's being transferred to [facility] for stroke evaluation on 2/25.    Assessment & Plan    Assessment and Plan: * Femoral hernia of left side Presented with L femoral hernia which was incarcerated with small bowel and causing obstruction Now s/p laparoscopic lysis of adhesions, reduction of incarcerated recurrent left femoral hernia, laparoscopic repair of incarcerated recurrent left femoral hernia, laparascopic repiar of right femoral hernia, laparoscopic repair of right obturator hernia, TAP block bilateral Post op course per surgery  Stroke ([place])  MRI with small acute cortical/subcortical left frontoparietal infarct.  Neuro c/s.  CTA head/neck reviewed with the patient and family.  Pt currently on IV heparin, transition to eliquis tomorrow.  Currently waiting for benefits check.  Echocardiogram done, results  pending.    Lipid panel showed LDl is 43, a1c is 5.4% Continue with statin.  Therapy evaluations done.  Neurology on board.   Hyponatremia- (present on admission) Mild.  With low urine sodium and exam, suspect hypovolemic Continue IVF and follow HCTZ could exacerbate, hence hold it on discharge.  Sodium has improved to 133.   Paroxysmal atrial fibrillation with RVR ([place])- (present on admission) Sinus at presentation, developed RVR in OR Now back in sinus.  Rate controlled lopressor 12.5 mg BID.  Transition to [place] on discharge for anti coagulation.  Family and patient looking for the CO pay amount.     Asymptomatic bacteriuria Has chronic indwelling catheter No fever or clear UTI symptoms Growing >100,000 pseudomonas and >100,000 klebsiella - will treat these as asymptomatic bacteruria unless she develops symptoms  Leukocytosis Resolved. follow  Hypomagnesemia Replaced  Gastroesophageal reflux disease without esophagitis- (present on admission) pepcid  Hyperlipidemia- (present on admission) statin  High bilirubin Mild, follow  COVID-19 virus detected- (present on admission) She reports testing positive on a home test on January 11th She's asymptomatic, without any new symptoms Isolation can be discontinued given within 90 days of recent infection  [facility]                                                                               Hannah Downs, is a 76 y.o. female, DOB - [birth year], [id number] Admit date - 11/04/2021    Outpatient Primary MD for the patient is Harlan Stains, MD  LOS - 4  days    Brief summary    Hannah Downs is a 76 y.o. female with past medical history of paroxysmal atrial fibrillation on Coumadin and left inguinal hernia who presents to the emergency department with lower abdominal pain, nausea, and vomiting.  She was found to have a recurrent left femoral hernia incarcerated with small bowel and causing obstruction.  She was given vitamin k and k centra prior to surgery.  She's now s/p surgery on 2/23.    Her hospital course was complicated by atrial fibrillation with RVR.  The hospitalist service was consulted to help with her RVR.  She's now converted back to sinus rhythm, but on 2/24 she developed right sided facial numbness and right arm numbness.  She told the nurse on 2/25 AM and she had a head CT which was negative.  A subsequent MRI showed a stroke.  She's being transferred to [facility] for stroke evaluation on 2/25.    Assessment & Plan    Assessment and Plan: * Femoral hernia of left side Presented with L femoral hernia which was incarcerated with small bowel and causing obstruction Now s/p laparoscopic lysis of adhesions, reduction of incarcerated recurrent left femoral hernia, laparoscopic repair of incarcerated recurrent left femoral hernia, laparascopic repiar of right femoral hernia, laparoscopic repair of right obturator hernia, TAP block bilateral Post op course per surgery  Stroke ([place])  MRI with small acute cortical/subcortical left frontoparietal infarct.  Neuro c/s.  CTA head/neck reviewed with the patient and family.  Pt currently on IV heparin, transition to eliquis tomorrow.  Currently waiting for benefits check.  Echocardiogram done, results  pending.    Lipid panel showed LDl is 43, a1c is 5.4% Continue with statin.  Therapy evaluations done.  Neurology on board.   Hyponatremia- (present on admission) Mild.  With low urine sodium and exam, suspect hypovolemic Continue IVF and follow HCTZ could exacerbate, hence hold it on discharge.  Sodium has improved to 133.   Paroxysmal atrial fibrillation with RVR ([place])- (present on admission) Sinus at presentation, developed RVR in OR Now back in sinus.  Rate controlled lopressor 12.5 mg BID.  Transition to [place] on discharge for anti coagulation.  Family and patient looking for the CO pay amount.     Asymptomatic bacteriuria Has chronic indwelling catheter No fever or clear UTI symptoms Growing >100,000 pseudomonas and >100,000 klebsiella - will treat these as asymptomatic bacteruria unless she develops symptoms  Leukocytosis Resolved. follow  Hypomagnesemia Replaced  Gastroesophageal reflux disease without esophagitis- (present on admission) pepcid  Hyperlipidemia- (present on admission) statin  High bilirubin Mild, follow  COVID-19 virus detected- (present on admission) She reports testing positive on a home test on January 11th She's asymptomatic, without any new symptoms Isolation can be discontinued given within 90 days of recent infection  focus of susceptibility inferior to this region likely reflects chronic microhemorrhage. There is no intracranial mass or mass effect. There is no hydrocephalus or extra-axial fluid collection. Prominence of the ventricles and sulci reflects minor parenchymal volume loss. Few patchy foci of T2 hyperintensity in the supratentorial white matter are nonspecific but may reflect minor chronic microvascular ischemic changes. Vascular: Major vessel flow voids at the skull base are preserved. Skull and upper cervical spine: Normal marrow signal is preserved. Sinuses/Orbits: Paranasal sinuses are aerated. Orbits are unremarkable. Other: Sella is unremarkable.  Mastoid air cells are clear. IMPRESSION: Small acute cortical/subcortical left frontoparietal infarct involving the perirolandic region. Minor chronic microvascular ischemic changes. Electronically Signed   By: Macy Mis M.D.   On: 11/07/2021 17:54   DG CHEST PORT 1 VIEW  Result Date: 11/07/2021 CLINICAL DATA:  Abnormal CT scan. EXAM: PORTABLE CHEST 1 VIEW COMPARISON:  Lung apices from head and neck CTA earlier today. Lung bases from abdominopelvic CT 11/04/2021 FINDINGS: Subcutaneous emphysema visualized on neck CTA earlier today is faintly visualized in the right supraclavicular region. The left chest wall  soft tissue gas is not definitively seen. There is no evidence of pneumomediastinum. Lung volumes are low with minor bibasilar atelectasis no pneumothorax or pleural effusion. Prominent heart size not well assessed due to anti lordotic positioning. IMPRESSION: 1. Subcutaneous emphysema visualized on neck CTA earlier today is faintly visualized in the right supraclavicular region. The left chest wall soft tissue gas is not definitively seen. 2. Low lung volumes with bibasilar atelectasis. Electronically Signed   By: Keith Rake M.D.   On: 11/07/2021 23:45   ECHOCARDIOGRAM COMPLETE  Result Date: 11/08/2021    ECHOCARDIOGRAM REPORT   Patient Name:   Hannah Downs Date of Exam: 11/08/2021 Medical Rec #:  [id number]           Height:       61.0 in Accession #:    [id number]          Weight:       116.0 lb Date of Birth:  1946-07-18           BSA:          1.498 m Patient Age:    22 years            BP:           154/101 mmHg Patient Gender: F                   HR:           80 bpm. Exam Location:  Inpatient Procedure: 2D Echo, Cardiac Doppler and Color Doppler Indications:    CVA  History:        Patient has prior history of Echocardiogram examinations, most                 recent 04/01/2020. Risk Factors:Hypertension and Dyslipidemia.  Sonographer:    Luisa Hart RDCS Referring Phys: [phone] A CALDWELL POWELL JR  Sonographer Comments: No parasternal window, suboptimal apical window and suboptimal subcostal window. Image acquisition challenging due to patient body habitus. IMPRESSIONS  1. Technically difficult; no parasternal views.  2. Left ventricular ejection fraction, by estimation, is 60 to 65%. The left ventricle has normal function. The left ventricle has no regional wall motion abnormalities. Left ventricular diastolic parameters are consistent with Grade II diastolic dysfunction (pseudonormalization).  3. Right ventricular systolic function is normal. The right ventricular size is normal.  4. Left  atrial  focus of susceptibility inferior to this region likely reflects chronic microhemorrhage. There is no intracranial mass or mass effect. There is no hydrocephalus or extra-axial fluid collection. Prominence of the ventricles and sulci reflects minor parenchymal volume loss. Few patchy foci of T2 hyperintensity in the supratentorial white matter are nonspecific but may reflect minor chronic microvascular ischemic changes. Vascular: Major vessel flow voids at the skull base are preserved. Skull and upper cervical spine: Normal marrow signal is preserved. Sinuses/Orbits: Paranasal sinuses are aerated. Orbits are unremarkable. Other: Sella is unremarkable.  Mastoid air cells are clear. IMPRESSION: Small acute cortical/subcortical left frontoparietal infarct involving the perirolandic region. Minor chronic microvascular ischemic changes. Electronically Signed   By: Macy Mis M.D.   On: 11/07/2021 17:54   DG CHEST PORT 1 VIEW  Result Date: 11/07/2021 CLINICAL DATA:  Abnormal CT scan. EXAM: PORTABLE CHEST 1 VIEW COMPARISON:  Lung apices from head and neck CTA earlier today. Lung bases from abdominopelvic CT 11/04/2021 FINDINGS: Subcutaneous emphysema visualized on neck CTA earlier today is faintly visualized in the right supraclavicular region. The left chest wall  soft tissue gas is not definitively seen. There is no evidence of pneumomediastinum. Lung volumes are low with minor bibasilar atelectasis no pneumothorax or pleural effusion. Prominent heart size not well assessed due to anti lordotic positioning. IMPRESSION: 1. Subcutaneous emphysema visualized on neck CTA earlier today is faintly visualized in the right supraclavicular region. The left chest wall soft tissue gas is not definitively seen. 2. Low lung volumes with bibasilar atelectasis. Electronically Signed   By: Keith Rake M.D.   On: 11/07/2021 23:45   ECHOCARDIOGRAM COMPLETE  Result Date: 11/08/2021    ECHOCARDIOGRAM REPORT   Patient Name:   Hannah Downs Date of Exam: 11/08/2021 Medical Rec #:  [id number]           Height:       61.0 in Accession #:    [id number]          Weight:       116.0 lb Date of Birth:  1946-07-18           BSA:          1.498 m Patient Age:    22 years            BP:           154/101 mmHg Patient Gender: F                   HR:           80 bpm. Exam Location:  Inpatient Procedure: 2D Echo, Cardiac Doppler and Color Doppler Indications:    CVA  History:        Patient has prior history of Echocardiogram examinations, most                 recent 04/01/2020. Risk Factors:Hypertension and Dyslipidemia.  Sonographer:    Luisa Hart RDCS Referring Phys: [phone] A CALDWELL POWELL JR  Sonographer Comments: No parasternal window, suboptimal apical window and suboptimal subcostal window. Image acquisition challenging due to patient body habitus. IMPRESSIONS  1. Technically difficult; no parasternal views.  2. Left ventricular ejection fraction, by estimation, is 60 to 65%. The left ventricle has normal function. The left ventricle has no regional wall motion abnormalities. Left ventricular diastolic parameters are consistent with Grade II diastolic dysfunction (pseudonormalization).  3. Right ventricular systolic function is normal. The right ventricular size is normal.  4. Left  atrial  [facility]                                                                               Hannah Downs, is a 76 y.o. female, DOB - [birth year], [id number] Admit date - 11/04/2021    Outpatient Primary MD for the patient is Harlan Stains, MD  LOS - 4  days    Brief summary    Hannah Downs is a 76 y.o. female with past medical history of paroxysmal atrial fibrillation on Coumadin and left inguinal hernia who presents to the emergency department with lower abdominal pain, nausea, and vomiting.  She was found to have a recurrent left femoral hernia incarcerated with small bowel and causing obstruction.  She was given vitamin k and k centra prior to surgery.  She's now s/p surgery on 2/23.    Her hospital course was complicated by atrial fibrillation with RVR.  The hospitalist service was consulted to help with her RVR.  She's now converted back to sinus rhythm, but on 2/24 she developed right sided facial numbness and right arm numbness.  She told the nurse on 2/25 AM and she had a head CT which was negative.  A subsequent MRI showed a stroke.  She's being transferred to [facility] for stroke evaluation on 2/25.    Assessment & Plan    Assessment and Plan: * Femoral hernia of left side Presented with L femoral hernia which was incarcerated with small bowel and causing obstruction Now s/p laparoscopic lysis of adhesions, reduction of incarcerated recurrent left femoral hernia, laparoscopic repair of incarcerated recurrent left femoral hernia, laparascopic repiar of right femoral hernia, laparoscopic repair of right obturator hernia, TAP block bilateral Post op course per surgery  Stroke ([place])  MRI with small acute cortical/subcortical left frontoparietal infarct.  Neuro c/s.  CTA head/neck reviewed with the patient and family.  Pt currently on IV heparin, transition to eliquis tomorrow.  Currently waiting for benefits check.  Echocardiogram done, results  pending.    Lipid panel showed LDl is 43, a1c is 5.4% Continue with statin.  Therapy evaluations done.  Neurology on board.   Hyponatremia- (present on admission) Mild.  With low urine sodium and exam, suspect hypovolemic Continue IVF and follow HCTZ could exacerbate, hence hold it on discharge.  Sodium has improved to 133.   Paroxysmal atrial fibrillation with RVR ([place])- (present on admission) Sinus at presentation, developed RVR in OR Now back in sinus.  Rate controlled lopressor 12.5 mg BID.  Transition to [place] on discharge for anti coagulation.  Family and patient looking for the CO pay amount.     Asymptomatic bacteriuria Has chronic indwelling catheter No fever or clear UTI symptoms Growing >100,000 pseudomonas and >100,000 klebsiella - will treat these as asymptomatic bacteruria unless she develops symptoms  Leukocytosis Resolved. follow  Hypomagnesemia Replaced  Gastroesophageal reflux disease without esophagitis- (present on admission) pepcid  Hyperlipidemia- (present on admission) statin  High bilirubin Mild, follow  COVID-19 virus detected- (present on admission) She reports testing positive on a home test on January 11th She's asymptomatic, without any new symptoms Isolation can be discontinued given within 90 days of recent infection  focus of susceptibility inferior to this region likely reflects chronic microhemorrhage. There is no intracranial mass or mass effect. There is no hydrocephalus or extra-axial fluid collection. Prominence of the ventricles and sulci reflects minor parenchymal volume loss. Few patchy foci of T2 hyperintensity in the supratentorial white matter are nonspecific but may reflect minor chronic microvascular ischemic changes. Vascular: Major vessel flow voids at the skull base are preserved. Skull and upper cervical spine: Normal marrow signal is preserved. Sinuses/Orbits: Paranasal sinuses are aerated. Orbits are unremarkable. Other: Sella is unremarkable.  Mastoid air cells are clear. IMPRESSION: Small acute cortical/subcortical left frontoparietal infarct involving the perirolandic region. Minor chronic microvascular ischemic changes. Electronically Signed   By: Macy Mis M.D.   On: 11/07/2021 17:54   DG CHEST PORT 1 VIEW  Result Date: 11/07/2021 CLINICAL DATA:  Abnormal CT scan. EXAM: PORTABLE CHEST 1 VIEW COMPARISON:  Lung apices from head and neck CTA earlier today. Lung bases from abdominopelvic CT 11/04/2021 FINDINGS: Subcutaneous emphysema visualized on neck CTA earlier today is faintly visualized in the right supraclavicular region. The left chest wall  soft tissue gas is not definitively seen. There is no evidence of pneumomediastinum. Lung volumes are low with minor bibasilar atelectasis no pneumothorax or pleural effusion. Prominent heart size not well assessed due to anti lordotic positioning. IMPRESSION: 1. Subcutaneous emphysema visualized on neck CTA earlier today is faintly visualized in the right supraclavicular region. The left chest wall soft tissue gas is not definitively seen. 2. Low lung volumes with bibasilar atelectasis. Electronically Signed   By: Keith Rake M.D.   On: 11/07/2021 23:45   ECHOCARDIOGRAM COMPLETE  Result Date: 11/08/2021    ECHOCARDIOGRAM REPORT   Patient Name:   Hannah Downs Date of Exam: 11/08/2021 Medical Rec #:  [id number]           Height:       61.0 in Accession #:    [id number]          Weight:       116.0 lb Date of Birth:  1946-07-18           BSA:          1.498 m Patient Age:    22 years            BP:           154/101 mmHg Patient Gender: F                   HR:           80 bpm. Exam Location:  Inpatient Procedure: 2D Echo, Cardiac Doppler and Color Doppler Indications:    CVA  History:        Patient has prior history of Echocardiogram examinations, most                 recent 04/01/2020. Risk Factors:Hypertension and Dyslipidemia.  Sonographer:    Luisa Hart RDCS Referring Phys: [phone] A CALDWELL POWELL JR  Sonographer Comments: No parasternal window, suboptimal apical window and suboptimal subcostal window. Image acquisition challenging due to patient body habitus. IMPRESSIONS  1. Technically difficult; no parasternal views.  2. Left ventricular ejection fraction, by estimation, is 60 to 65%. The left ventricle has normal function. The left ventricle has no regional wall motion abnormalities. Left ventricular diastolic parameters are consistent with Grade II diastolic dysfunction (pseudonormalization).  3. Right ventricular systolic function is normal. The right ventricular size is normal.  4. Left  atrial

## 2021-11-08 NOTE — Progress Notes (Signed)
4 Days Post-Op   Subjective/Chief Complaint: Transferred from [facility] secondary to CVA Patient reports most of her symptoms are improved.  She is now able to grip her coffee cup Still having post op lower abdominal pain.  Tolerating PO and passing flatus   Objective: Vital signs in last 24 hours: Temp:  [97.5 F (36.4 C)-98.2 F (36.8 C)] 97.9 F (36.6 C) (02/26 0748) Pulse Rate:  [74-99] 91 (02/26 0748) Resp:  [14-20] 18 (02/26 0748) BP: (116-154)/(70-101) 154/101 (02/26 0748) SpO2:  [96 %-99 %] 99 % (02/26 0748) Last BM Date : 11/06/21  Intake/Output from previous day: 02/25 0701 - 02/26 0700 In: 1571.8 [P.O.:480; I.V.:1091.8] Out: 3900 [Urine:3900] Intake/Output this shift: No intake/output data recorded.  Exam: Awake and alert, following commands Neuro grossly intact Abdomen soft, mild bruising and tenderness  Lab Results:  Recent Labs    11/06/21 0520 11/07/21 0714  WBC 11.9* 10.2  HGB 11.2* 10.9*  HCT 33.0* 33.6*  PLT 257 206   BMET Recent Labs    11/07/21 0714 11/07/21 1558  NA 129* 131*  K 3.8 4.0  CL 98 97*  CO2 26 27  GLUCOSE 103* 154*  BUN 10 9  CREATININE 0.49 0.53  CALCIUM 8.3* 8.6*   PT/INR Recent Labs    11/06/21 0520 11/07/21 0714  LABPROT 14.1 15.6*  INR 1.1 1.2   ABG No results for input(s): PHART, HCO3 in the last 72 hours.  Invalid input(s): PCO2, PO2  Studies/Results: CT ANGIO HEAD W OR WO CONTRAST  Result Date: 11/07/2021 CLINICAL DATA:  Stroke, follow-up EXAM: CT ANGIOGRAPHY HEAD AND NECK TECHNIQUE: Multidetector CT imaging of the head and neck was performed using the standard protocol during bolus administration of intravenous contrast. Multiplanar CT image reconstructions and MIPs were obtained to evaluate the vascular anatomy. Carotid stenosis measurements (when applicable) are obtained utilizing NASCET criteria, using the distal internal carotid diameter as the denominator. RADIATION DOSE REDUCTION: This exam was performed  according to the departmental dose-optimization program which includes automated exposure control, adjustment of the mA and/or kV according to patient size and/or use of iterative reconstruction technique. CONTRAST:  87mL OMNIPAQUE IOHEXOL 350 MG/ML SOLN COMPARISON:  Correlation made with recent CT and MR imaging FINDINGS: CTA NECK Aortic arch: Great vessel origins are patent. Right carotid system: Patent.  No stenosis. Left carotid system: Patent.  No stenosis. Vertebral arteries: Patent. Right vertebral is dominant. No stenosis. Skeleton: Cervical spine degenerative changes. Other neck: Foci of soft tissue air present bilaterally in the neck, supraclavicular regions, and ventral chest wall. Subcentimeter right thyroid nodule which no ultrasound follow-up is recommended by current guidelines. Upper chest: As above.  Included upper lungs are clear. Review of the MIP images confirms the above findings CTA HEAD Anterior circulation: Intracranial internal carotid arteries are patent. Anterior and middle cerebral arteries are patent. Specifically, there is no proximal left MCA occlusion. Posterior circulation: Intracranial vertebral arteries are patent. Basilar artery is patent. Major cerebellar artery origins are patent. Bilateral posterior communicating arteries are present. Posterior cerebral arteries are patent. Venous sinuses: Patent as allowed by contrast bolus timing. Review of the MIP images confirms the above findings IMPRESSION: No large vessel occlusion, hemodynamically significant stenosis, or evidence of dissection. Scattered foci of air in the neck soft tissues, supraclavicular regions, and imaged ventral chest wall. Electronically Signed   By: Macy Mis M.D.   On: 11/07/2021 20:00   CT HEAD WO CONTRAST (5MM)  Result Date: 11/07/2021 CLINICAL DATA:  Syncope/presyncope with cerebrovascular  cause suspected. Facial and right arm numbness/tingling starting last night and recurring this morning. EXAM:  CT HEAD WITHOUT CONTRAST TECHNIQUE: Contiguous axial images were obtained from the base of the skull through the vertex without intravenous contrast. RADIATION DOSE REDUCTION: This exam was performed according to the departmental dose-optimization program which includes automated exposure control, adjustment of the mA and/or kV according to patient size and/or use of iterative reconstruction technique. COMPARISON:  None. FINDINGS: Brain: No evidence of acute infarction, hemorrhage, hydrocephalus, extra-axial collection or mass lesion/mass effect. Vascular: No hyperdense vessel or unexpected calcification. Skull: Normal. Negative for fracture or focal lesion. Sinuses/Orbits: No acute finding. IMPRESSION: Negative head CT.  No explanation for symptoms. Electronically Signed   By: Jorje Guild M.D.   On: 11/07/2021 12:21   CT ANGIO NECK W OR WO CONTRAST  Result Date: 11/07/2021 CLINICAL DATA:  Stroke, follow-up EXAM: CT ANGIOGRAPHY HEAD AND NECK TECHNIQUE: Multidetector CT imaging of the head and neck was performed using the standard protocol during bolus administration of intravenous contrast. Multiplanar CT image reconstructions and MIPs were obtained to evaluate the vascular anatomy. Carotid stenosis measurements (when applicable) are obtained utilizing NASCET criteria, using the distal internal carotid diameter as the denominator. RADIATION DOSE REDUCTION: This exam was performed according to the departmental dose-optimization program which includes automated exposure control, adjustment of the mA and/or kV according to patient size and/or use of iterative reconstruction technique. CONTRAST:  82mL OMNIPAQUE IOHEXOL 350 MG/ML SOLN COMPARISON:  Correlation made with recent CT and MR imaging FINDINGS: CTA NECK Aortic arch: Great vessel origins are patent. Right carotid system: Patent.  No stenosis. Left carotid system: Patent.  No stenosis. Vertebral arteries: Patent. Right vertebral is dominant. No stenosis.  Skeleton: Cervical spine degenerative changes. Other neck: Foci of soft tissue air present bilaterally in the neck, supraclavicular regions, and ventral chest wall. Subcentimeter right thyroid nodule which no ultrasound follow-up is recommended by current guidelines. Upper chest: As above.  Included upper lungs are clear. Review of the MIP images confirms the above findings CTA HEAD Anterior circulation: Intracranial internal carotid arteries are patent. Anterior and middle cerebral arteries are patent. Specifically, there is no proximal left MCA occlusion. Posterior circulation: Intracranial vertebral arteries are patent. Basilar artery is patent. Major cerebellar artery origins are patent. Bilateral posterior communicating arteries are present. Posterior cerebral arteries are patent. Venous sinuses: Patent as allowed by contrast bolus timing. Review of the MIP images confirms the above findings IMPRESSION: No large vessel occlusion, hemodynamically significant stenosis, or evidence of dissection. Scattered foci of air in the neck soft tissues, supraclavicular regions, and imaged ventral chest wall. Electronically Signed   By: Macy Mis M.D.   On: 11/07/2021 20:00   MR BRAIN WO CONTRAST  Result Date: 11/07/2021 CLINICAL DATA:  Neuro deficit, acute, stroke suspected EXAM: MRI HEAD WITHOUT CONTRAST TECHNIQUE: Multiplanar, multiecho pulse sequences of the brain and surrounding structures were obtained without intravenous contrast. COMPARISON:  None. FINDINGS: Brain: Cortical/subcortical mildly reduced diffusion in the left perirolandic frontoparietal lobes extending to the margin of the body left lateral ventricle. A focus of susceptibility inferior to this region likely reflects chronic microhemorrhage. There is no intracranial mass or mass effect. There is no hydrocephalus or extra-axial fluid collection. Prominence of the ventricles and sulci reflects minor parenchymal volume loss. Few patchy foci of T2  hyperintensity in the supratentorial white matter are nonspecific but may reflect minor chronic microvascular ischemic changes. Vascular: Major vessel flow voids at the skull base are preserved. Skull  and upper cervical spine: Normal marrow signal is preserved. Sinuses/Orbits: Paranasal sinuses are aerated. Orbits are unremarkable. Other: Sella is unremarkable.  Mastoid air cells are clear. IMPRESSION: Small acute cortical/subcortical left frontoparietal infarct involving the perirolandic region. Minor chronic microvascular ischemic changes. Electronically Signed   By: Macy Mis M.D.   On: 11/07/2021 17:54   DG CHEST PORT 1 VIEW  Result Date: 11/07/2021 CLINICAL DATA:  Abnormal CT scan. EXAM: PORTABLE CHEST 1 VIEW COMPARISON:  Lung apices from head and neck CTA earlier today. Lung bases from abdominopelvic CT 11/04/2021 FINDINGS: Subcutaneous emphysema visualized on neck CTA earlier today is faintly visualized in the right supraclavicular region. The left chest wall soft tissue gas is not definitively seen. There is no evidence of pneumomediastinum. Lung volumes are low with minor bibasilar atelectasis no pneumothorax or pleural effusion. Prominent heart size not well assessed due to anti lordotic positioning. IMPRESSION: 1. Subcutaneous emphysema visualized on neck CTA earlier today is faintly visualized in the right supraclavicular region. The left chest wall soft tissue gas is not definitively seen. 2. Low lung volumes with bibasilar atelectasis. Electronically Signed   By: Keith Rake M.D.   On: 11/07/2021 23:45    Anti-infectives: Anti-infectives (From admission, onward)    Start     Dose/Rate Route Frequency Ordered Stop   11/05/21 0600  ceFAZolin (ANCEF) IVPB 2g/100 mL premix        2 g 200 mL/hr over 30 Minutes Intravenous Every 8 hours 11/05/21 0247 11/05/21 1421   11/04/21 2130  clindamycin (CLEOCIN) IVPB 900 mg       See Hyperspace for full Linked Orders Report.   900 mg 100  mL/hr over 30 Minutes Intravenous On call to O.R. 11/04/21 2027 11/04/21 2237   11/04/21 2115  gentamicin (GARAMYCIN) 260 mg in dextrose 5 % 100 mL IVPB       See Hyperspace for full Linked Orders Report.   5 mg/kg  52.6 kg 106.5 mL/hr over 60 Minutes Intravenous On call to O.R. 11/04/21 2027 11/04/21 2216   11/04/21 1945  piperacillin-tazobactam (ZOSYN) IVPB 3.375 g        3.375 g 100 mL/hr over 30 Minutes Intravenous  Once 11/04/21 1940 11/04/21 2050       Assessment/Plan: s/p Procedure(s): lysis of adhesions reduction of incarcerated recurrent left femoral hernia laparoscopic repair. laparoscopic right femoral hernia repair and right obturator repair with mesh. tap block (N/A)  Postop CVA  Appreciate Neurology's care Diet advance Continuing current care and work up of CVA   LOS: 4 days    Coralie Keens 11/08/2021

## 2021-11-08 NOTE — Evaluation (Signed)
Physical Therapy Evaluation Patient Details Name: Hannah Downs MRN: [id number] DOB: 04/17/1946 Today's Date: 11/08/2021  History of Present Illness  Pt is a 76yo female who went to [facility] for abdominal pain and was found to have an incarderated L femoral hernia with bowel obstruction. pt s/p emergent lap LOA, reduction L femoral hernia, repair of L formal hernia with mesh, repair of R femoral and R obturator hernias with mesh on 2/23. ON 2/24 pt with R UE and facial numbness with slurred speech. Imaging revealed small acute cortical/subcortical L frontoparietal infarct., most likely from anti-coagulation being on hold for surgery. PMH: afib, arthritis, GERD, h/o hernias with previous surgeries   Clinical Impression  Pt admitted with above. All stroke symptoms have resolved. Pt with c/o of abdominal soreness from surgery. Pt with good home set up and support. Educated on using folded blanket/pillow to place of abdomen during movement to help minimize pain. Pt with noted mild bilat LE edema. Acute PT to cont to follow to progress mobility and prevent deconditioning as pt was indep PTA.       Recommendations for follow up therapy are one component of a multi-disciplinary discharge planning process, led by the attending physician.  Recommendations may be updated based on patient status, additional functional criteria and insurance authorization.  Follow Up Recommendations No PT follow up    Assistance Recommended at Discharge Frequent or constant Supervision/Assistance  Patient can return home with the following  A little help with walking and/or transfers;A little help with bathing/dressing/bathroom;Assist for transportation    Equipment Recommendations None recommended by PT  Recommendations for Other Services       Functional Status Assessment Patient has had a recent decline in their functional status and demonstrates the ability to make significant improvements in function in a  reasonable and predictable amount of time.     Precautions / Restrictions Precautions Precautions: Other (comment) Precaution Comments: recently abdominal surgery Restrictions Weight Bearing Restrictions: No      Mobility  Bed Mobility Overal bed mobility: Needs Assistance Bed Mobility: Sit to Sidelying         Sit to sidelying: Min assist General bed mobility comments: pt received sitting EOB upon PT arrival, pt then instructed on sidelying to rolling return to supine or ECHO    Transfers Overall transfer level: Needs assistance Equipment used: None Transfers: Sit to/from Stand Sit to Stand: Min guard           General transfer comment: pt slow to move but no assist needed    Ambulation/Gait Ambulation/Gait assistance: Min guard Gait Distance (Feet): 160 Feet Assistive device: None Gait Pattern/deviations: Step-through pattern, Decreased stride length, Trunk flexed Gait velocity: wfl Gait velocity interpretation: <1.31 ft/sec, indicative of household ambulator   General Gait Details: pt with trunk flexion due to abdominal pain with trunk extension from surgery, no LOB, reports "this is further than I walk at home."  Stairs            Wheelchair Mobility    Modified Rankin (Stroke Patients Only) Modified Rankin (Stroke Patients Only) Pre-Morbid Rankin Score: Slight disability Modified Rankin: Slight disability     Balance Overall balance assessment: Mild deficits observed, not formally tested                                           Pertinent Vitals/Pain Pain Assessment Pain Assessment:  (c/o  soreness from surgery)    Home Living Family/patient expects to be discharged to:: Private residence Living Arrangements: Spouse/significant other;Children (dtr, grandson) Available Help at Discharge: Family;Available 24 hours/day Type of Home: House Home Access: Stairs to enter Entrance Stairs-Rails: Right Entrance Stairs-Number of  Steps: 4   Home Layout: One level Home Equipment: Shower seat      Prior Function Prior Level of Function : Independent/Modified Independent             Mobility Comments: indep, no AD, doesn't drive ADLs Comments: indep     Hand Dominance   Dominant Hand: Right    Extremity/Trunk Assessment   Upper Extremity Assessment Upper Extremity Assessment: Overall WFL for tasks assessed    Lower Extremity Assessment Lower Extremity Assessment: Overall WFL for tasks assessed    Cervical / Trunk Assessment Cervical / Trunk Assessment: Other exceptions Cervical / Trunk Exceptions: abdominal surgery on 2/23  Communication   Communication: No difficulties  Cognition Arousal/Alertness: Awake/alert Behavior During Therapy: WFL for tasks assessed/performed Overall Cognitive Status: Within Functional Limits for tasks assessed                                          General Comments General comments (skin integrity, edema, etc.): abdominal incisions, not observed    Exercises     Assessment/Plan    PT Assessment Patient needs continued PT services  PT Problem List Decreased strength;Decreased mobility;Decreased knowledge of use of DME;Decreased activity tolerance       PT Treatment Interventions DME instruction;Gait training;Stair training;Functional mobility training;Therapeutic activities;Therapeutic exercise;Balance training    PT Goals (Current goals can be found in the Care Plan section)  Acute Rehab PT Goals Patient Stated Goal: home today PT Goal Formulation: With patient Time For Goal Achievement: 11/22/21 Potential to Achieve Goals: Good    Frequency Min 3X/week     Co-evaluation               AM-PAC PT "6 Clicks" Mobility  Outcome Measure Help needed turning from your back to your side while in a flat bed without using bedrails?: A Little Help needed moving from lying on your back to sitting on the side of a flat bed without  using bedrails?: A Little Help needed moving to and from a bed to a chair (including a wheelchair)?: A Little Help needed standing up from a chair using your arms (e.g., wheelchair or bedside chair)?: A Little Help needed to walk in hospital room?: A Little Help needed climbing 3-5 steps with a railing? : A Little 6 Click Score: 18    End of Session Equipment Utilized During Treatment: Gait belt Activity Tolerance: Patient tolerated treatment well Patient left: in bed;with call bell/phone within reach (ECHO technician i) Nurse Communication: Mobility status PT Visit Diagnosis: Muscle weakness (generalized) (M62.81);Difficulty in walking, not elsewhere classified (R26.2)    Time: 9604-5409 PT Time Calculation (min) (ACUTE ONLY): 24 min   Charges:   PT Evaluation $PT Eval Low Complexity: 1 Low PT Treatments $Gait Training: 8-22 mins        Lewis Shock, PT, DPT Acute Rehabilitation Services Pager #: [phone] Office #: [phone]   Iona Hansen 11/08/2021, 8:58 AM

## 2021-11-08 NOTE — Consult Note (Signed)
Neurology Consultation  Reason for Consult: Slurred speech, right-sided weakness and numbness Referring Physician: Dr. Florene Glen, [facility]  CC: Slurred speech, right-sided weakness, stroke and MRI  History is obtained from: Patient, chart  HPI: Hannah Downs is a 76 y.o. female past medical history of paroxysmal atrial fibrillation who is on Coumadin at home, and left inguinal hernia who presented to the emergency room with lower abdominal pain nausea vomiting was found to have a recurrent left femoral hernia with incarcerated small bowel requiring surgery for which her anticoagulation was held.  She was taken to the OR on 11/05/2021 and anticoagulation was resumed the day after.  On Friday, 11/06/2021 sometime at night, she noted some numbness on her right arm and also noted some numbness in the right face with some word finding difficulty versus slurred speech.  Her daughter who was at her bedside did not notice much in terms of problems.  She was also having some trouble grasping the coffee mug with that right arm.  She did not immediately report this to her care team. She brought this to the attention of the medicine team next day who ordered a MRI of the brain to evaluate for any evidence of stroke since she was off anticoagulation and the MRI of the brain did reveal a small acute cortical/subcortical left frontoparietal infarct involving the perirolandic region on the evening of Saturday, 11/07/2021.  No petechial hemorrhage in the area of the stroke but there is a chronic microhemorrhage inferior to the region. Neurology consultation obtained for the stroke   LKW: Greater than 24 hours-sometime evening of Friday, 11/06/2021 tpa given?: no, outside window Premorbid modified Rankin scale (mRS): 0 ROS: Full ROS was performed and is negative except as noted in the HPI  Past Medical History:  Diagnosis Date   Arthritis    Dysrhythmia    afib   GERD (gastroesophageal reflux disease)     Hypercholesterolemia    Hypertension    Lumbar degenerative disc disease    Miscarriage    times 3   Osteopenia    Seasonal allergies    Varicose vein    right leg   Vitamin D deficiency    Family History  Problem Relation Age of Onset   Cancer Mother    Atrial fibrillation Father    Stroke Father      Social History:   reports that she has never smoked. She has never used smokeless tobacco. She reports that she does not drink alcohol and does not use drugs.  Medications  Current Facility-Administered Medications:    0.9 %  sodium chloride infusion, , Intravenous, Continuous, Elodia Florence., MD, Last Rate: 75 mL/hr at 11/08/21 0054, Restarted at 11/08/21 0054   acetaminophen (TYLENOL) tablet 1,000 mg, 1,000 mg, Oral, Q6H, Gross, Remo Lipps, MD, 1,000 mg at 11/07/21 2322   alum & mag hydroxide-simeth (MAALOX/MYLANTA) 200-200-20 MG/5ML suspension 30 mL, 30 mL, Oral, Q6H PRN, Michael Boston, MD   atorvastatin (LIPITOR) tablet 80 mg, 80 mg, Oral, Daily, Elodia Florence., MD   bisacodyl (DULCOLAX) suppository 10 mg, 10 mg, Rectal, Daily PRN, Michael Boston, MD   bisacodyl (DULCOLAX) suppository 10 mg, 10 mg, Rectal, Daily, Gross, Remo Lipps, MD   [COMPLETED] 6 CHG cloth bath - Day of surgery, , , Once **AND** Chlorhexidine Gluconate Cloth 2 % PADS 6 each, 6 each, Topical, Once, Michael Boston, MD   cholecalciferol (VITAMIN D3) tablet 2,000 Units, 2,000 Units, Oral, Daily, Michael Boston, MD, 2,000 Units at  11/07/21 1028   diphenhydrAMINE (BENADRYL) 12.5 MG/5ML elixir 12.5 mg, 12.5 mg, Oral, Q6H PRN **OR** diphenhydrAMINE (BENADRYL) injection 12.5 mg, 12.5 mg, Intravenous, Q6H PRN, Michael Boston, MD   famotidine (PEPCID) tablet 20 mg, 20 mg, Oral, Daily, Elodia Florence., MD, 20 mg at 11/07/21 1007   feeding supplement (ENSURE SURGERY) liquid 237 mL, 237 mL, Oral, Q24H, Kae Heller, Chelsea A, MD, 237 mL at 11/07/21 1516   gabapentin (NEURONTIN) capsule 100 mg, 100 mg, Oral, BID,  Michael Boston, MD, 100 mg at 11/07/21 2133   heparin ADULT infusion 100 units/mL (25000 units/279mL), 1,000 Units/hr, Intravenous, Continuous, Shade, Haze Justin, RPH, Last Rate: 10 mL/hr at 11/07/21 1744, 1,000 Units/hr at 11/07/21 1744   HYDROmorphone (DILAUDID) injection 0.5-2 mg, 0.5-2 mg, Intravenous, Q4H PRN, Michael Boston, MD   lip balm (CARMEX) ointment 1 application, 1 application, Topical, BID, Michael Boston, MD, 1 application at [id number]   magic mouthwash, 15 mL, Oral, QID PRN, Michael Boston, MD   magnesium hydroxide (MILK OF MAGNESIA) suspension 30 mL, 30 mL, Oral, Daily PRN, Michael Boston, MD   menthol-cetylpyridinium (CEPACOL) lozenge 3 mg, 1 lozenge, Oral, PRN, Michael Boston, MD   methocarbamol (ROBAXIN) tablet 500 mg, 500 mg, Oral, Q6H PRN, Michael Boston, MD   metoprolol tartrate (LOPRESSOR) injection 2.5 mg, 2.5 mg, Intravenous, Q2H PRN, Opyd, Ilene Qua, MD   metoprolol tartrate (LOPRESSOR) injection 5 mg, 5 mg, Intravenous, Q6H PRN, Michael Boston, MD   metoprolol tartrate (LOPRESSOR) tablet 12.5 mg, 12.5 mg, Oral, BID, Elodia Florence., MD, 12.5 mg at 11/07/21 2133   multivitamin with minerals tablet 1 tablet, 1 tablet, Oral, Daily, Romana Juniper A, MD, 1 tablet at 11/07/21 1008   ondansetron (ZOFRAN-ODT) disintegrating tablet 4 mg, 4 mg, Oral, Q6H PRN **OR** ondansetron (ZOFRAN) injection 4 mg, 4 mg, Intravenous, Q6H PRN, Michael Boston, MD   oxyCODONE (Oxy IR/ROXICODONE) immediate release tablet 5-10 mg, 5-10 mg, Oral, Q4H PRN, Michael Boston, MD   phenol (CHLORASEPTIC) mouth spray 2 spray, 2 spray, Mouth/Throat, PRN, Michael Boston, MD   polycarbophil (FIBERCON) tablet 625 mg, 625 mg, Oral, BID, Michael Boston, MD, 625 mg at 11/07/21 1030   prochlorperazine (COMPAZINE) tablet 10 mg, 10 mg, Oral, Q6H PRN **OR** prochlorperazine (COMPAZINE) injection 5-10 mg, 5-10 mg, Intravenous, Q6H PRN, Michael Boston, MD   simethicone (MYLICON) chewable tablet 40 mg, 40 mg, Oral, Q6H  PRN, Michael Boston, MD   traMADol Veatrice Bourbon) tablet 50-100 mg, 50-100 mg, Oral, Q6H PRN, Michael Boston, MD, 100 mg at 11/07/21 1253   Exam: Current vital signs: BP (!) 153/92 (BP Location: Right Arm)    Pulse 77    Temp 98.1 F (36.7 C) (Oral)    Resp 20    Ht 5\' 1"  (1.549 m)    Wt 52.6 kg    SpO2 96%    BMI 21.92 kg/m  Vital signs in last 24 hours: Temp:  [97.9 F (36.6 C)-98.2 F (36.8 C)] 98.1 F (36.7 C) (02/26 0016) Pulse Rate:  [77-99] 77 (02/26 0016) Resp:  [14-20] 20 (02/26 0016) BP: (126-153)/(82-92) 153/92 (02/26 0016) SpO2:  [96 %-98 %] 96 % (02/26 0016)  GENERAL: Awake, alert in NAD HEENT: - Normocephalic and atraumatic, dry mm, no LN++, no Thyromegally LUNGS - Clear to auscultation bilaterally with no wheezes CV - S1S2 RRR, no m/r/g, equal pulses bilaterally. ABDOMEN -postop, tender to palpation, left groin bulge. Ext: warm, well perfused, intact peripheral pulses, no edema  NEURO:  Mental Status: AA&Ox3  Language:  speech is fluent with no dysarthria.  Naming, repetition, fluency, and comprehension intact. Cranial Nerves: PERRL. EOMI, visual fields full, no facial asymmetry, facial sensation intact, hearing intact, tongue/uvula/soft palate midline, normal sternocleidomastoid and trapezius muscle strength. No evidence of tongue atrophy or fibrillations Motor: 5/5 with no drift in any of the 4 extremities Tone: is normal and bulk is normal Sensation- Intact to light touch bilaterally Coordination: FTN intact bilaterally, no ataxia in BLE.  Mild diminished speed for finger taps on the right. Gait- deferred NIHSS-0   Labs I have reviewed labs in epic and the results pertinent to this consultation are:   CBC    Component Value Date/Time   WBC 10.2 11/07/2021 0714   RBC 3.60 (L) 11/07/2021 0714   HGB 10.9 (L) 11/07/2021 0714   HGB 13.8 08/03/2021 1247   HCT 33.6 (L) 11/07/2021 0714   HCT 39.4 08/03/2021 1247   PLT 206 11/07/2021 0714   PLT 301 08/03/2021 1247    MCV 93.3 11/07/2021 0714   MCV 88 08/03/2021 1247   MCH 30.3 11/07/2021 0714   MCHC 32.4 11/07/2021 0714   RDW 13.4 11/07/2021 0714   RDW 12.5 08/03/2021 1247   LYMPHSABS 1.0 11/04/2021 1739   MONOABS 0.4 11/04/2021 1739   EOSABS 0.0 11/04/2021 1739   BASOSABS 0.1 11/04/2021 1739    CMP     Component Value Date/Time   NA 131 (L) 11/07/2021 1558   NA 137 08/03/2021 1247   K 4.0 11/07/2021 1558   CL 97 (L) 11/07/2021 1558   CO2 27 11/07/2021 1558   GLUCOSE 154 (H) 11/07/2021 1558   BUN 9 11/07/2021 1558   BUN 14 08/03/2021 1247   CREATININE 0.53 11/07/2021 1558   CALCIUM 8.6 (L) 11/07/2021 1558   PROT 6.0 (L) 11/06/2021 0520   ALBUMIN 3.4 (L) 11/06/2021 0520   AST 28 11/06/2021 0520   ALT 17 11/06/2021 0520   ALKPHOS 37 (L) 11/06/2021 0520   BILITOT 1.9 (H) 11/06/2021 0520   GFRNONAA >60 11/07/2021 1558   GFRAA >90 06/27/2014 0425   Imaging I have reviewed the images obtained:  CT-head-no acute changes  MRI examination of the brain-small acute cortical/subcortical left frontoparietal infarct involving the perirolandic region.  Minor chronic microvascular ischemic changes in the brain.  Small chronic microhemorrhage inferior to the area of the stroke.  CT angiography head and neck with no large vessel occlusion or hemodynamically significant stenosis or evidence of dissection.  There are scattered foci of air in the neck and soft tissues supraclavicular regions in the imaged ventral chest wall.  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 Symptoms much improved.-Stroke scale 0.  Mild deficits with finger taps on the  right.  Recommendations: -At this time for stroke prevention, anticoagulation should be continued-I have recommended that the anticoagulation with heparin be done per the stroke protocol and lower heparin levels.  With the acute stroke, there is always a risk of hemorrhagic transformation but at this time, the benefits of anticoagulation outweigh the risk for this patient.  This was discussed with the patient as well. -She was on Coumadin as an outpatient.  Reports having had some trouble with maintaining therapeutic levels but was mostly in therapeutic range.  I would recommend considering a DOAC-has had trouble with cost-please engage social work/case management to see if they can help with cost medication. -Given that she has had a stroke, I would recommend completing the stroke risk factor work-up with a 2D echocardiogram, A1c, lipid panel. -Head and neck vessel imaging has been performed this evening after the consult was called-reviewed-no significant findings of large vessel involvement. -PT OT speech therapy -Frequent neurochecks -Telemetry Stroke team will follow. Preliminary plan was discussed with Dr. Florene Glen over the phone when he called the consult at around 7 PM. -- Amie Portland, MD Neurologist [facility] Pager: [phone]

## 2021-11-08 NOTE — Evaluation (Signed)
Occupational Therapy Evaluation Patient Details Name: Hannah Downs MRN: [id number] DOB: [birth year] Today's Date: 11/08/2021   History of Present Illness Pt is a 76yo female who went to [facility] for abdominal pain and was found to have an incarderated L femoral hernia with bowel obstruction. pt s/p emergent lap LOA, reduction L femoral hernia, repair of L formal hernia with mesh, repair of R femoral and R obturator hernias with mesh on 2/23. ON 2/24 pt with R UE and facial numbness with slurred speech. Imaging revealed small acute cortical/subcortical L frontoparietal infarct., most likely from anti-coagulation being on hold for surgery. PMH: afib, arthritis, GERD, h/o hernias with previous surgeries   Clinical Impression   Orlie Pollen was evaluated s/p the above CVA, she is indep at baseline but does not drive. She lives with her daughter and grandson who can assist as needed at d/c. Upon evaluation pt demonstrated supervision ADLs without AD, she moved slowly and deliberately due to abdominal pain but overall WFL. Pt denies vision changes, her BUE coordination is slow but WFL. Reviewed [facility] FAST stroke education, pt verbalized great understanding. Pt does not required further acute OT. Recommend d/c to home with support of family.     Recommendations for follow up therapy are one component of a multi-disciplinary discharge planning process, led by the attending physician.  Recommendations may be updated based on patient status, additional functional criteria and insurance authorization.   Follow Up Recommendations  No OT follow up    Assistance Recommended at Discharge Intermittent Supervision/Assistance  Patient can return home with the following A little help with bathing/dressing/bathroom;Assist for transportation;Help with stairs or ramp for entrance    Functional Status Assessment  Patient has had a recent decline in their functional status and demonstrates the ability to make significant  improvements in function in a reasonable and predictable amount of time.  Equipment Recommendations  None recommended by OT       Precautions / Restrictions Precautions Precautions: Other (comment) Precaution Comments: recently abdominal surgery Restrictions Weight Bearing Restrictions: No      Mobility Bed Mobility Overal bed mobility: Needs Assistance             General bed mobility comments: pt sitting EOb upon arrival    Transfers Overall transfer level: Needs assistance Equipment used: None Transfers: Sit to/from Stand Sit to Stand: Supervision           General transfer comment: pt slow to move but no assist needed      Balance Overall balance assessment: Mild deficits observed, not formally tested                   ADL either performed or assessed with clinical judgement   ADL Overall ADL's : Needs assistance/impaired               General ADL Comments: Pt demonstrated supervision level BADLs and functional mobility without AD, pt moves slowly and deliberately due to abdominal pain but overall WFL.     Vision Baseline Vision/History: 1 Wears glasses Ability to See in Adequate Light: 0 Adequate Patient Visual Report: No change from baseline Vision Assessment?: No apparent visual deficits            Pertinent Vitals/Pain Pain Assessment Pain Assessment: Faces Faces Pain Scale: Hurts a little bit Pain Location: abdomen Pain Descriptors / Indicators: Discomfort     Hand Dominance Left   Extremity/Trunk Assessment Upper Extremity Assessment Upper Extremity Assessment: Overall WFL for tasks assessed  Lower Extremity Assessment Lower Extremity Assessment: Overall WFL for tasks assessed   Cervical / Trunk Assessment Cervical / Trunk Assessment: Other exceptions Cervical / Trunk Exceptions: abdominal surgery on 2/23   Communication Communication Communication: No difficulties   Cognition Arousal/Alertness:  Awake/alert Behavior During Therapy: WFL for tasks assessed/performed Overall Cognitive Status: Within Functional Limits for tasks assessed         General Comments  VSS on RA, family present and supportive - reviewed BE FAST education            Home Living Family/patient expects to be discharged to:: Private residence Living Arrangements: Spouse/significant other;Children Available Help at Discharge: Family;Available 24 hours/day Type of Home: House Home Access: Stairs to enter [employer] of Steps: 4 Entrance Stairs-Rails: Right Home Layout: One level     Bathroom Shower/Tub: Producer, television/film/video: Handicapped height Bathroom Accessibility: Yes   Home Equipment: Educational psychologist (4 wheels)          Prior Functioning/Environment Prior Level of Function : Independent/Modified Independent             Mobility Comments: indep, no AD, doesn't drive ADLs Comments: indep        OT Problem List: Decreased activity tolerance;Impaired balance (sitting and/or standing);Pain         OT Goals(Current goals can be found in the care plan section) Acute Rehab OT Goals Patient Stated Goal: home OT Goal Formulation: All assessment and education complete, DC therapy Time For Goal Achievement: 11/08/21 Potential to Achieve Goals: Good   AM-PAC OT "6 Clicks" Daily Activity     Outcome Measure Help from another person eating meals?: None Help from another person taking care of personal grooming?: None Help from another person toileting, which includes using toliet, bedpan, or urinal?: None Help from another person bathing (including washing, rinsing, drying)?: None Help from another person to put on and taking off regular upper body clothing?: None Help from another person to put on and taking off regular lower body clothing?: None 6 Click Score: 24   End of Session Nurse Communication: Mobility status  Activity Tolerance: Patient tolerated  treatment well Patient left: in bed;with call bell/phone within reach;with family/visitor present  OT Visit Diagnosis: Repeated falls (R29.6)                Time: [id number] OT Time Calculation (min): 12 min Charges:  OT General Charges $OT Visit: 1 Visit OT Evaluation $OT Eval Low Complexity: 1 Low   Valerye Kobus A Marquavious Nazar 11/08/2021, 12:32 PM

## 2021-11-08 NOTE — Progress Notes (Signed)
ANTICOAGULATION CONSULT NOTE - Follow Up Consult  Pharmacy Consult for Heparin Indication: atrial fibrillation and stroke  Allergies  Allergen Reactions   Lisinopril Cough    Patient Measurements: Height: 5\' 1"  (154.9 cm) Weight: 52.6 kg (116 lb) IBW/kg (Calculated) : 47.8 kg Heparin Dosing Weight: 52.6 kg  Vital Signs: Temp: 97.5 F (36.4 C) (02/26 0325) Temp Source: Axillary (02/26 0325) BP: 116/70 (02/26 0325) Pulse Rate: 74 (02/26 0325)  Labs: Recent Labs    11/05/21 1341 11/05/21 2113 11/05/21 2113 11/06/21 0520 11/06/21 1559 11/06/21 1855 11/07/21 0714 11/07/21 1558 11/08/21 0206  HGB 11.7* 11.7*  --  11.2*  --   --  10.9*  --   --   HCT 34.9* 34.9*  --  33.0*  --   --  33.6*  --   --   PLT 309  --   --  257  --   --  206  --   --   LABPROT  --   --   --  14.1  --   --  15.6*  --   --   INR  --   --   --  1.1  --   --  1.2  --   --   HEPARINUNFRC  --   --   --   --   --    < > 0.38 0.29* 0.22*  CREATININE  --   --    < > 1.08* 0.57  --  0.49 0.53  --    < > = values in this interval not displayed.    Estimated Creatinine Clearance: 45.1 mL/min (by C-G formula based on SCr of 0.53 mg/dL).  Assessment: 76 yo female presented to [facility] ED on 2/22 with lower abdominal pain, nausea, and vomiting, found to have recurrent left femoral hernia incarcerated with small bowel and causing obstruction. PTA the patient is on warfarin for atrial fibrillation. INR 2.7 upon admission and the patient was given vitamin K IV 10 mg and KCentra on 2/22. The patient is s/p lysis of adhesions and reduction of recurrent hernia on 2/23. Pharmacy is consulted to dose heparin.   PTA Warfarin Regimen: warfarin PO 5 mg daily  Of note, the patient has been unable to switch to a DOAC due to cost. A transitions of care consult has been placed to evaluate cost and patient assistance options for apixaban and rivaroxaban.   Heparin level is therapeutic at 0.39 while running at 1100 units/hr. Per  the RN, there have been no issues with the infusion and the patient is without signs or symptoms of bleeding. Hgb 11.4, platelets 272.  Goal of Therapy:  Heparin level 0.3-0.5 units/ml Monitor platelets by anticoagulation protocol: Yes   Plan:  Continue heparin IV at 1100 units/hr Obtain a daily heparin level and CBC Monitor for signs and symptoms of bleeding Evaluate the patient's cost of apixaban and rivaroxaban Follow plans to resume oral anticoagulation (warfarin vs. DOAC)  Shauna Hugh, PharmD, [place]  PGY-2 Pharmacy Resident 11/08/2021 7:41 AM  Please check [email or web address] for unit-specific pharmacy phone numbers.

## 2021-11-08 NOTE — Care Management (Signed)
Benefit check for rivaroxaban 15 mg daily, rivaroxaban 20 mg daily, and apixaban 5 mg twice daily submitted to [facility].  Benefit check results will be posted Monday. Patient is eligible for manufacturer's 30 day free coupon for either medication for the first fill of the prescription, if not used before.

## 2021-11-08 NOTE — Progress Notes (Addendum)
STROKE TEAM PROGRESS NOTE   INTERVAL HISTORY Her family is at the bedside. Sometime on Friday 2/24, she noted some numbness in her right arm, difficulty holding her coffee cup, numbness in her right face and some word finding difficulty.  She brought this to the attention of an internal medicine team the next day who ordered an MRI of the brain.  This MRI done on 2/25 did reveal a small acute cortical/subcortical left frontal parietal infarct involving the perirolandic region. Today she states that she is feeling 95% better from yesterday.   Vitals:   11/07/21 1428 11/07/21 2004 11/08/21 0016 11/08/21 0325  BP: (!) 141/91 (!) 143/84 (!) 153/92 116/70  Pulse: 77 99 77 74  Resp: 14 18 20 16   Temp: 98 F (36.7 C) 98.2 F (36.8 C) 98.1 F (36.7 C) (!) 97.5 F (36.4 C)  TempSrc: Oral  Oral Axillary  SpO2: 98% 98% 96% 99%  Weight:      Height:       CBC:  Recent Labs  Lab 11/04/21 1739 11/04/21 1746 11/06/21 0520 11/07/21 0714  WBC 12.9*   < > 11.9* 10.2  NEUTROABS 11.3*  --   --   --   HGB 15.2*   < > 11.2* 10.9*  HCT 44.6   < > 33.0* 33.6*  MCV 88.7   < > 91.9 93.3  PLT 341   < > 257 206   < > = values in this interval not displayed.   Basic Metabolic Panel:  Recent Labs  Lab 11/05/21 0430 11/06/21 0520 11/06/21 1559 11/07/21 0714 11/07/21 1558  NA 130* 127*   < > 129* 131*  K 3.6 4.7   < > 3.8 4.0  CL 98 95*   < > 98 97*  CO2 24 25   < > 26 27  GLUCOSE 163* 125*   < > 103* 154*  BUN 14 24*   < > 10 9  CREATININE 0.60 1.08*   < > 0.49 0.53  CALCIUM 8.5* 8.6*   < > 8.3* 8.6*  MG 1.5* 2.8*  --   --   --   PHOS  --  3.4  --   --   --    < > = values in this interval not displayed.   Lipid Panel:  Recent Labs  Lab 11/08/21 0206  CHOL 109  TRIG 55  HDL 55  CHOLHDL 2.0  VLDL 11  LDLCALC 43   HgbA1c:  Recent Labs  Lab 11/08/21 0206  HGBA1C 5.4   Urine Drug Screen: No results for input(s): LABOPIA, COCAINSCRNUR, LABBENZ, AMPHETMU, THCU, LABBARB in the last  168 hours.  Alcohol Level No results for input(s): ETH in the last 168 hours.  IMAGING past 24 hours CT ANGIO HEAD W OR WO CONTRAST  Result Date: 11/07/2021 CLINICAL DATA:  Stroke, follow-up EXAM: CT ANGIOGRAPHY HEAD AND NECK TECHNIQUE: Multidetector CT imaging of the head and neck was performed using the standard protocol during bolus administration of intravenous contrast. Multiplanar CT image reconstructions and MIPs were obtained to evaluate the vascular anatomy. Carotid stenosis measurements (when applicable) are obtained utilizing NASCET criteria, using the distal internal carotid diameter as the denominator. RADIATION DOSE REDUCTION: This exam was performed according to the departmental dose-optimization program which includes automated exposure control, adjustment of the mA and/or kV according to patient size and/or use of iterative reconstruction technique. CONTRAST:  110mL OMNIPAQUE IOHEXOL 350 MG/ML SOLN COMPARISON:  Correlation made with recent CT and  MR imaging FINDINGS: CTA NECK Aortic arch: Great vessel origins are patent. Right carotid system: Patent.  No stenosis. Left carotid system: Patent.  No stenosis. Vertebral arteries: Patent. Right vertebral is dominant. No stenosis. Skeleton: Cervical spine degenerative changes. Other neck: Foci of soft tissue air present bilaterally in the neck, supraclavicular regions, and ventral chest wall. Subcentimeter right thyroid nodule which no ultrasound follow-up is recommended by current guidelines. Upper chest: As above.  Included upper lungs are clear. Review of the MIP images confirms the above findings CTA HEAD Anterior circulation: Intracranial internal carotid arteries are patent. Anterior and middle cerebral arteries are patent. Specifically, there is no proximal left MCA occlusion. Posterior circulation: Intracranial vertebral arteries are patent. Basilar artery is patent. Major cerebellar artery origins are patent. Bilateral posterior  communicating arteries are present. Posterior cerebral arteries are patent. Venous sinuses: Patent as allowed by contrast bolus timing. Review of the MIP images confirms the above findings IMPRESSION: No large vessel occlusion, hemodynamically significant stenosis, or evidence of dissection. Scattered foci of air in the neck soft tissues, supraclavicular regions, and imaged ventral chest wall. Electronically Signed   By: Macy Mis M.D.   On: 11/07/2021 20:00   CT HEAD WO CONTRAST (5MM)  Result Date: 11/07/2021 CLINICAL DATA:  Syncope/presyncope with cerebrovascular cause suspected. Facial and right arm numbness/tingling starting last night and recurring this morning. EXAM: CT HEAD WITHOUT CONTRAST TECHNIQUE: Contiguous axial images were obtained from the base of the skull through the vertex without intravenous contrast. RADIATION DOSE REDUCTION: This exam was performed according to the departmental dose-optimization program which includes automated exposure control, adjustment of the mA and/or kV according to patient size and/or use of iterative reconstruction technique. COMPARISON:  None. FINDINGS: Brain: No evidence of acute infarction, hemorrhage, hydrocephalus, extra-axial collection or mass lesion/mass effect. Vascular: No hyperdense vessel or unexpected calcification. Skull: Normal. Negative for fracture or focal lesion. Sinuses/Orbits: No acute finding. IMPRESSION: Negative head CT.  No explanation for symptoms. Electronically Signed   By: Jorje Guild M.D.   On: 11/07/2021 12:21   CT ANGIO NECK W OR WO CONTRAST  Result Date: 11/07/2021 CLINICAL DATA:  Stroke, follow-up EXAM: CT ANGIOGRAPHY HEAD AND NECK TECHNIQUE: Multidetector CT imaging of the head and neck was performed using the standard protocol during bolus administration of intravenous contrast. Multiplanar CT image reconstructions and MIPs were obtained to evaluate the vascular anatomy. Carotid stenosis measurements (when applicable)  are obtained utilizing NASCET criteria, using the distal internal carotid diameter as the denominator. RADIATION DOSE REDUCTION: This exam was performed according to the departmental dose-optimization program which includes automated exposure control, adjustment of the mA and/or kV according to patient size and/or use of iterative reconstruction technique. CONTRAST:  16mL OMNIPAQUE IOHEXOL 350 MG/ML SOLN COMPARISON:  Correlation made with recent CT and MR imaging FINDINGS: CTA NECK Aortic arch: Great vessel origins are patent. Right carotid system: Patent.  No stenosis. Left carotid system: Patent.  No stenosis. Vertebral arteries: Patent. Right vertebral is dominant. No stenosis. Skeleton: Cervical spine degenerative changes. Other neck: Foci of soft tissue air present bilaterally in the neck, supraclavicular regions, and ventral chest wall. Subcentimeter right thyroid nodule which no ultrasound follow-up is recommended by current guidelines. Upper chest: As above.  Included upper lungs are clear. Review of the MIP images confirms the above findings CTA HEAD Anterior circulation: Intracranial internal carotid arteries are patent. Anterior and middle cerebral arteries are patent. Specifically, there is no proximal left MCA occlusion. Posterior circulation: Intracranial vertebral arteries  are patent. Basilar artery is patent. Major cerebellar artery origins are patent. Bilateral posterior communicating arteries are present. Posterior cerebral arteries are patent. Venous sinuses: Patent as allowed by contrast bolus timing. Review of the MIP images confirms the above findings IMPRESSION: No large vessel occlusion, hemodynamically significant stenosis, or evidence of dissection. Scattered foci of air in the neck soft tissues, supraclavicular regions, and imaged ventral chest wall. Electronically Signed   By: Macy Mis M.D.   On: 11/07/2021 20:00   MR BRAIN WO CONTRAST  Result Date: 11/07/2021 CLINICAL DATA:   Neuro deficit, acute, stroke suspected EXAM: MRI HEAD WITHOUT CONTRAST TECHNIQUE: Multiplanar, multiecho pulse sequences of the brain and surrounding structures were obtained without intravenous contrast. COMPARISON:  None. FINDINGS: Brain: Cortical/subcortical mildly reduced diffusion in the left perirolandic frontoparietal lobes extending to the margin of the body left lateral ventricle. A focus of susceptibility inferior to this region likely reflects chronic microhemorrhage. There is no intracranial mass or mass effect. There is no hydrocephalus or extra-axial fluid collection. Prominence of the ventricles and sulci reflects minor parenchymal volume loss. Few patchy foci of T2 hyperintensity in the supratentorial white matter are nonspecific but may reflect minor chronic microvascular ischemic changes. Vascular: Major vessel flow voids at the skull base are preserved. Skull and upper cervical spine: Normal marrow signal is preserved. Sinuses/Orbits: Paranasal sinuses are aerated. Orbits are unremarkable. Other: Sella is unremarkable.  Mastoid air cells are clear. IMPRESSION: Small acute cortical/subcortical left frontoparietal infarct involving the perirolandic region. Minor chronic microvascular ischemic changes. Electronically Signed   By: Macy Mis M.D.   On: 11/07/2021 17:54   DG CHEST PORT 1 VIEW  Result Date: 11/07/2021 CLINICAL DATA:  Abnormal CT scan. EXAM: PORTABLE CHEST 1 VIEW COMPARISON:  Lung apices from head and neck CTA earlier today. Lung bases from abdominopelvic CT 11/04/2021 FINDINGS: Subcutaneous emphysema visualized on neck CTA earlier today is faintly visualized in the right supraclavicular region. The left chest wall soft tissue gas is not definitively seen. There is no evidence of pneumomediastinum. Lung volumes are low with minor bibasilar atelectasis no pneumothorax or pleural effusion. Prominent heart size not well assessed due to anti lordotic positioning. IMPRESSION: 1.  Subcutaneous emphysema visualized on neck CTA earlier today is faintly visualized in the right supraclavicular region. The left chest wall soft tissue gas is not definitively seen. 2. Low lung volumes with bibasilar atelectasis. Electronically Signed   By: Keith Rake M.D.   On: 11/07/2021 23:45    PHYSICAL EXAM  Physical Exam  Constitutional: Appears well-developed and well-nourished.  Cardiovascular: Normal rate and regular rhythm.  Respiratory: Effort normal, non-labored breathing  Neuro: Mental Status: Patient is awake, alert, oriented to person, place, month, year, and situation. Patient is able to give a clear and coherent history. No signs of aphasia or neglect Cranial Nerves: II: Visual Fields are full. Pupils are equal, round, and reactive to light.   III,IV, VI: EOMI without ptosis or diploplia.  V: Facial sensation is symmetric to temperature VII: Facial movement is symmetric resting and smiling VIII: Hearing is intact to voice X: Palate elevates symmetrically XI: Shoulder shrug is symmetric. XII: Tongue protrudes midline without atrophy or fasciculations.  Motor: Tone is normal. Bulk is normal. 5/5 strength was present in all four extremities.  Sensory: Sensation is symmetric to light touch and temperature in the arms and legs. No extinction to DSS present.  Cerebellar: FNF and HKS are intact bilaterally   ASSESSMENT/PLAN Ms. HAROLD HOYT is a  76 y.o. female with history of paroxysmal atrial fibrillation on Coumadin, left inguinal hernia presenting to the ED on 2/22 for abdominal pain related to an incarcerated left femoral hernia and bowel obstruction.  She is s/p emergent laparoscopic reduction and repair that took place on 2/23.  Her anticoagulation was held that day and then resumed on 2/24.  Sometime on Friday 2/24, she noted some numbness in her right arm, difficulty holding her coffee cup, numbness in her right face and some word finding difficulty.  She  brought this to the attention of an internal medicine team the next day who ordered an MRI of the brain.  This MRI done on 2/25 did reveal a small acute cortical/subcortical left frontal parietal infarct involving the perirolandic region. Numbness, weakness, word finding difficulty have resolved. Currently on heparin IV, plan to bridge to [facility] tomorrow and make final decision with TOC on which Carlyss the patient can afford.   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 MRI  small acute cortical/subcortical left frontal parietal infarct involving the perirolandic region. 2D Echo: EF 60 to 65% no thrombus noted. LDL 43 HgbA1c 5.4 VTE prophylaxis - IV Heparin warfarin daily prior to admission, now on heparin IV.  Therapy recommendations:  No follow up Disposition:  Pending  Paroxysmal Atrial Fibrillation Was on coumadin- stopped and reversed for emergent procedure Now on IV heparin TOC to evaluate cost of various OAC Resume per primary   Hypertension Home meds:  Lopressor 12.5mg  BID Stable Permissive hypertension (OK if < 220/120) but gradually normalize in 5-7 days Long-term BP goal normotensive  Hyperlipidemia Home meds: Atorvastatin 80 mg, resumed in hospital LDL 43, goal < 70 Continue statin at discharge  Other Stroke Risk Factors Advanced Age >/= 59   Other Active Problems Recurrent femoral hernia Lysis of adhesions reduction of incarcerated recurrent left femoral hernia laparoscopic repair. laparoscopic right femoral hernia repair and right obturator repair with mesh- Dr. Ninfa Linden [facility] consult for medication assistance  Hospital day # 4  Patient seen and examined by NP/APP with MD. MD to update note as needed.   Janine Ores, DNP,  FNP-BC [facility] Pager: [phone]  ATTENDING ATTESTATION:  76 year old female with past medical history of paroxysmal atrial fibrillation on Coumadin who required emergent surgery for incarcerated small bowel and left femoral hernia.  Coumadin was held for this procedure and resume the day after.  Had onset of right facial numbness and slurred speech word finding difficulties.  Not a candidate for TNK.  Her symptoms have resolved.  Currently her neurological exam is unremarkable and nonfocal.  MRI shows small cortical and subcortical left frontal parietal infarct.  Currently she is on heparin.  There is discussion about switching to a DOAC but she does not have pharmacy benefits with her Medicare insurance.  She pays for the Coumadin out-of-pocket 4 dollars a month at [facility].  We will have social work consult to see if she may qualify for either reduced payment or free DOAC.  If this is not possible we will resume Coumadin with goal INR 2.0-2.5 no PT or OT needs identified.  Echo completed.  Stroke work-up is completed.  She can follow-up in the stroke clinic after discharge at [facility] with Dr. Leonie Man.  Neurology will sign off please call with questions.  Dr.  Dhwani Venkatesh evaluated pt independently, reviewed imaging, chart, labs. Discussed and formulated plan with the APP. Please see APP note above for details.   Total 36 minutes spent on counseling patient and coordinating care, writing notes and reviewing chart.  Payslie Mccaig,MD    To contact Stroke Continuity provider, please refer to [email or web address] After hours, contact General Neurology

## 2021-11-08 NOTE — Progress Notes (Signed)
ANTICOAGULATION CONSULT NOTE - Follow Up Consult  Pharmacy Consult for Heparin Indication: atrial fibrillation (PTA warfarin), stroke  Allergies  Allergen Reactions   Lisinopril Cough    Patient Measurements: Height: 5\' 1"  (154.9 cm) Weight: 52.6 kg (116 lb) IBW/kg (Calculated) : 47.8 Heparin Dosing Weight: 52.6 kg  Vital Signs: Temp: 98.1 F (36.7 C) (02/26 0016) Temp Source: Oral (02/26 0016) BP: 153/92 (02/26 0016) Pulse Rate: 77 (02/26 0016)  Labs: Recent Labs    11/05/21 0430 11/05/21 1341 11/05/21 2113 11/06/21 0520 11/06/21 1559 11/06/21 1855 11/07/21 0714 11/07/21 1558 11/08/21 0206  HGB 12.1 11.7* 11.7* 11.2*  --   --  10.9*  --   --   HCT 35.8* 34.9* 34.9* 33.0*  --   --  33.6*  --   --   PLT 295 309  --  257  --   --  206  --   --   LABPROT 15.2  --   --  14.1  --   --  15.6*  --   --   INR 1.2  --   --  1.1  --   --  1.2  --   --   HEPARINUNFRC  --   --   --   --   --    < > 0.38 0.29* 0.22*  CREATININE 0.60  --   --  1.08* 0.57  --  0.49 0.53  --    < > = values in this interval not displayed.     Estimated Creatinine Clearance: 45.1 mL/min (by C-G formula based on SCr of 0.53 mg/dL).   Medications:  Infusions:   sodium chloride 75 mL/hr at 11/08/21 0054   heparin 1,000 Units/hr (11/07/21 1744)    Assessment: 76 yo female presented to [facility] ED on 2/22 with lower abdominal pain, nausea, and vomiting, found to have recurrent left femoral hernia incarcerated with small bowel and causing obstruction.  Patient is on warfarin PTA for atrial fibrillation.  INR 2.7 on admit. Due to emergent need for surgery, warfarin was reversed with IV Vitamin K and Kcentra on 2/22.  S/p lysis of adhesions and reduction of recurrent hernia on 2/23.  Pharmacy is consulted to start a heparin drip with no bolus on 2/24 while warfarin continues to be on hold.   2/26 AM update: Heparin level low  Goal of Therapy:  Heparin level 0.3-0.5 units/mL Monitor platelets by  anticoagulation protocol: Yes   Plan:  Inc heparin to 1100 units/hr 1200 heparin level  Narda Bonds, PharmD, BCPS Clinical Pharmacist Phone: [phone]

## 2021-11-09 ENCOUNTER — Other Ambulatory Visit (HOSPITAL_COMMUNITY): Payer: Self-pay

## 2021-11-09 DIAGNOSIS — K4190 Unilateral femoral hernia, without obstruction or gangrene, not specified as recurrent: Secondary | ICD-10-CM | POA: Diagnosis not present

## 2021-11-09 DIAGNOSIS — Z7901 Long term (current) use of anticoagulants: Secondary | ICD-10-CM | POA: Diagnosis not present

## 2021-11-09 DIAGNOSIS — R8271 Bacteriuria: Secondary | ICD-10-CM | POA: Diagnosis not present

## 2021-11-09 LAB — CULTURE, BLOOD (ROUTINE X 2)
Culture: NO GROWTH
Culture: NO GROWTH
Special Requests: ADEQUATE

## 2021-11-09 LAB — CBC
HCT: 29.3 % — ABNORMAL LOW (ref 36.0–46.0)
Hemoglobin: 10 g/dL — ABNORMAL LOW (ref 12.0–15.0)
MCH: 30.8 pg (ref 26.0–34.0)
MCHC: 34.1 g/dL (ref 30.0–36.0)
MCV: 90.2 fL (ref 80.0–100.0)
Platelets: 251 10*3/uL (ref 150–400)
RBC: 3.25 MIL/uL — ABNORMAL LOW (ref 3.87–5.11)
RDW: 13.2 % (ref 11.5–15.5)
WBC: 7.1 10*3/uL (ref 4.0–10.5)
nRBC: 0 % (ref 0.0–0.2)

## 2021-11-09 LAB — HEPARIN LEVEL (UNFRACTIONATED): Heparin Unfractionated: 0.3 IU/mL (ref 0.30–0.70)

## 2021-11-09 LAB — PROTIME-INR
INR: 1.3 — ABNORMAL HIGH (ref 0.8–1.2)
Prothrombin Time: 15.9 seconds — ABNORMAL HIGH (ref 11.4–15.2)

## 2021-11-09 MED ORDER — OXYCODONE HCL 5 MG PO TABS
5.0000 mg | ORAL_TABLET | Freq: Four times a day (QID) | ORAL | 0 refills | Status: DC | PRN
Start: 2021-11-09 — End: 2021-11-10

## 2021-11-09 MED ORDER — ENSURE SURGERY PO LIQD: 237.0000 mL | ORAL | 0 refills | Status: DC

## 2021-11-09 MED ORDER — TRAMADOL HCL 50 MG PO TABS: 50.0000 mg | ORAL_TABLET | Freq: Two times a day (BID) | ORAL | 0 refills | Status: DC | PRN

## 2021-11-09 MED ORDER — ATORVASTATIN CALCIUM 80 MG PO TABS: 80.0000 mg | ORAL_TABLET | Freq: Every day | ORAL | 3 refills | Status: DC

## 2021-11-09 MED ORDER — PROCHLORPERAZINE MALEATE 10 MG PO TABS: 10.0000 mg | ORAL_TABLET | Freq: Four times a day (QID) | ORAL | 0 refills | Status: DC | PRN

## 2021-11-09 MED ORDER — APIXABAN 5 MG PO TABS: 5.0000 mg | ORAL_TABLET | Freq: Two times a day (BID) | ORAL | 3 refills | Status: DC

## 2021-11-09 MED ORDER — CALCIUM POLYCARBOPHIL 625 MG PO TABS: 625.0000 mg | ORAL_TABLET | Freq: Two times a day (BID) | ORAL | 0 refills | Status: DC

## 2021-11-09 MED ORDER — GABAPENTIN 100 MG PO CAPS: 100.0000 mg | ORAL_CAPSULE | Freq: Two times a day (BID) | ORAL | 0 refills | Status: DC

## 2021-11-09 MED ORDER — ADULT MULTIVITAMIN W/MINERALS CH: 1.0000 | ORAL_TABLET | Freq: Every day | ORAL | Status: DC

## 2021-11-09 MED ORDER — APIXABAN 5 MG PO TABS
5.0000 mg | ORAL_TABLET | Freq: Two times a day (BID) | ORAL | Status: DC
  Administered 2021-11-09: 5 mg via ORAL
  Filled 2021-11-09: qty 1

## 2021-11-09 MED ORDER — SIMETHICONE 80 MG PO CHEW: 40.0000 mg | CHEWABLE_TABLET | Freq: Four times a day (QID) | ORAL | 0 refills | Status: DC | PRN

## 2021-11-09 NOTE — Discharge Instructions (Addendum)
[facility] _______[facility]  UMBILICAL OR INGUINAL HERNIA REPAIR: POST OP INSTRUCTIONS  Always review your discharge instruction sheet given to you by the facility where your surgery was performed. IF YOU HAVE DISABILITY OR FAMILY LEAVE FORMS, YOU MUST BRING THEM TO THE OFFICE FOR PROCESSING.   DO NOT GIVE THEM TO YOUR DOCTOR.  1. A  prescription for pain medication may be given to you upon discharge.  Take your pain medication as prescribed, if needed.  If narcotic pain medicine is not needed, then you may take acetaminophen (Tylenol) as needed. Do not take NSAIDs such as ibuprofen while taking Eliquis 2. Take your usually prescribed medications unless otherwise directed. If you need a refill on your pain medication, please contact your pharmacy.  They will contact our office to request authorization. Prescriptions will not be filled after 5 pm or on week-ends. 3. You should follow a light diet the first 24 hours after arrival home, such as soup and crackers, etc.  Be sure to include lots of fluids daily.  Resume your normal diet the day after surgery. 4.Most patients will experience some swelling and bruising around the umbilicus or in the groin and scrotum.  Ice packs and reclining will help.  Swelling and bruising can take several days to resolve.  6. It is common to experience some constipation if taking pain medication after surgery.  Increasing fluid intake and taking a stool softener (such as Colace) will usually help or prevent this problem from occurring.  A mild laxative (Milk of Magnesia or Miralax) should be taken according to package directions if there are no bowel movements after 48 hours. 7. Unless discharge instructions indicate otherwise, you may remove your bandages 24-48 hours after surgery, and you may shower at that time.  You may have steri-strips (small skin tapes) in place directly over the incision.  These strips should be left on the skin for 7-10 days.  If your  surgeon used skin glue on the incision, you may shower in 24 hours.  The glue will flake off over the next 2-3 weeks.  Any sutures or staples will be removed at the office during your follow-up visit. 8. ACTIVITIES:  You may resume regular (light) daily activities beginning the next day--such as daily self-care, walking, climbing stairs--gradually increasing activities as tolerated.  You may have sexual intercourse when it is comfortable.  Refrain from any heavy lifting or straining until approved by your doctor.  a.You may drive when you are no longer taking prescription pain medication, you can comfortably wear a seatbelt, and you can safely maneuver your car and apply brakes. b.RETURN TO WORK:   _____________________________________________  9.You should see your doctor in the office for a follow-up appointment approximately 2-3 weeks after your surgery.  Make sure that you call for this appointment within a day or two after you arrive home to insure a convenient appointment time. 10.OTHER INSTRUCTIONS: _________________________    _____________________________________  WHEN TO CALL YOUR DOCTOR: Fever over 101.0 Inability to urinate Nausea and/or vomiting Extreme swelling or bruising Continued bleeding from incision. Increased pain, redness, or drainage from the incision  The clinic staff is available to answer your questions during regular business hours.  Please dont hesitate to call and ask to speak to one of the nurses for clinical concerns.  If you have a medical emergency, go to the nearest emergency room or call 911.  A surgeon from [facility] is always on call at the hospital  [address] [place], [place], [place]  [address] ?  [address], [place], [place]   [address] [phone] ? [phone] ? FAX [phone] Web site: [email or web address]  Information on my medicine - ELIQUIS (apixaban)  Why was Eliquis prescribed for  you? Eliquis was prescribed for you to reduce the risk of forming blood clots that can cause a stroke if you have a medical condition called atrial fibrillation (a type of irregular heartbeat) OR to reduce the risk of a blood clots forming after orthopedic surgery.  What do You need to know about Eliquis ? Take your Eliquis TWICE DAILY - one tablet in the morning and one tablet in the evening with or without food.  It would be best to take the doses about the same time each day.  If you have difficulty swallowing the tablet whole please discuss with your pharmacist how to take the medication safely.  Take Eliquis exactly as prescribed by your doctor and DO NOT stop taking Eliquis without talking to the doctor who prescribed the medication.  Stopping may increase your risk of developing a new clot or stroke.  Refill your prescription before you run out.  After discharge, you should have regular check-up appointments with your healthcare provider that is prescribing your Eliquis.  In the future your dose may need to be changed if your kidney function or weight changes by a significant amount or as you get older.  What do you do if you miss a dose? If you miss a dose, take it as soon as you remember on the same day and resume taking twice daily.  Do not take more than one dose of ELIQUIS at the same time.  Important Safety Information A possible side effect of Eliquis is bleeding. You should call your healthcare provider right away if you experience any of the following: Bleeding from an injury or your nose that does not stop. Unusual colored urine (red or dark brown) or unusual colored stools (red or black). Unusual bruising for unknown reasons. A serious fall or if you hit your head (even if there is no bleeding).  Some medicines may interact with Eliquis and might increase your risk of bleeding or clotting while on Eliquis. To help avoid this, consult your healthcare provider or  pharmacist prior to using any new prescription or non-prescription medications, including herbals, vitamins, non-steroidal anti-inflammatory drugs (NSAIDs) and supplements.  This website has more information on Eliquis (apixaban): [email or web address].

## 2021-11-09 NOTE — Progress Notes (Signed)
Discharge instructions discussed with patient and daughter. All questions answered. Ivs removed, tele discontinued. Pt belongings returned. Pt wheeled off unit for transport home by daughter.   Melony Overly, RN

## 2021-11-09 NOTE — TOC Benefit Eligibility Note (Signed)
Transition of Care [facility]) Benefit Eligibility Note    Patient Details  Name: [name] MRN: [id number] Date of Birth: Apr 02, 1946   Medication/Dose: [name] 15 MG BID  and   XARELTO 20 MG DAILY  ,  ELIQUIS  5 MG BID        Prescription Coverage Preferred Pharmacy: Lindaann Slough with Person/Company/Phone Number:: [facility] [place] #  [phone]           Additional Notes: NO PRESCRITION COVERAGE, PT'S  HAS  A DISCOUNT CARD    Mardene Sayer Phone Number: 11/09/2021, 10:09 AM

## 2021-11-09 NOTE — Plan of Care (Signed)
Pt is alert oriented x 4  ambulatory with walker. Pt states she has minimal numbness and tingling to right side of face, cheek, pt states it has improved. Pt has chronic foley in place. Heparin drip continued per order.  Pt has episode of SVT up to 200s for about 2 min. Provider paged to inform.  PRN metoprolol given per order. Pt HR increased again to 120s, prn metoprolol IV given again. Pt states she takes 150mg  Metoprolol ER at home. Pt has a history of afib. No distress noted.     Problem: Education: Goal: Knowledge of General Education information will improve Description: Including pain rating scale, medication(s)/side effects and non-pharmacologic comfort measures Outcome: Progressing   Problem: Health Behavior/Discharge Planning: Goal: Ability to manage health-related needs will improve Outcome: Progressing   Problem: Clinical Measurements: Goal: Ability to maintain clinical measurements within normal limits will improve Outcome: Progressing Goal: Will remain free from infection Outcome: Progressing Goal: Diagnostic test results will improve Outcome: Progressing Goal: Respiratory complications will improve Outcome: Progressing Goal: Cardiovascular complication will be avoided Outcome: Progressing   Problem: Activity: Goal: Risk for activity intolerance will decrease Outcome: Progressing   Problem: Nutrition: Goal: Adequate nutrition will be maintained Outcome: Progressing   Problem: Coping: Goal: Level of anxiety will decrease Outcome: Progressing   Problem: Elimination: Goal: Will not experience complications related to bowel motility Outcome: Progressing Goal: Will not experience complications related to urinary retention Outcome: Progressing   Problem: Pain Managment: Goal: General experience of comfort will improve Outcome: Progressing   Problem: Safety: Goal: Ability to remain free from injury will improve Outcome: Progressing   Problem: Skin  Integrity: Goal: Risk for impaired skin integrity will decrease Outcome: Progressing

## 2021-11-09 NOTE — Discharge Summary (Signed)
femoral hernia over inguinal hernia but with signs of closed loop obstruction both within the abdomen and within the hernia sac with multiple loops of bowel which are herniated into the hernia at multiple levels, involving proximal  ileal or distal jejunal loops as well as distal and mid ileal loops with multiple sites of transition. 2. Based on appearance this is highly suspicious for incarcerated hernia. Surgical consultation is advised. 3. No current signs of pneumatosis but with developing interloop fluid and with moderately large amount of fluid in the hernia sac which is suspicious for at risk bowel in the setting of closed loop obstruction. 4. Compression of mesenteric veins further raises concern for potential for developing bowel ischemia. Bowel continues to enhance on the current exam. 5. Signs of complex pelvic organ prolapse as before. Aortic Atherosclerosis (ICD10-I70.0). Critical Value/emergent results were called by telephone at the time of interpretation on 11/04/2021 at 6:59 pm to provider DAVID YAO , who verbally acknowledged these results. Electronically Signed   By: Zetta Bills M.D.   On: 11/04/2021 19:01   DG CHEST PORT 1 VIEW  Result Date: 11/07/2021 CLINICAL DATA:  Abnormal CT scan. EXAM: PORTABLE CHEST 1 VIEW COMPARISON:  Lung apices from head and neck CTA earlier today. Lung bases from abdominopelvic CT 11/04/2021 FINDINGS: Subcutaneous emphysema visualized on neck CTA earlier today is faintly visualized in the right supraclavicular region. The left chest wall soft tissue gas is not definitively seen. There is no evidence of pneumomediastinum. Lung volumes are low with minor bibasilar atelectasis no pneumothorax or pleural effusion. Prominent heart size not well assessed due to anti lordotic positioning. IMPRESSION: 1. Subcutaneous emphysema visualized on neck CTA earlier today is faintly visualized in the right supraclavicular region. The left chest wall soft tissue gas is not definitively seen. 2. Low lung volumes with bibasilar atelectasis. Electronically Signed   By: Keith Rake M.D.   On: 11/07/2021 23:45   ECHOCARDIOGRAM COMPLETE  Result Date: 11/08/2021    ECHOCARDIOGRAM REPORT   Patient Name:    Hannah Downs Date of Exam: 11/08/2021 Medical Rec #:  [id number]           Height:       61.0 in Accession #:    [id number]          Weight:       116.0 lb Date of Birth:  Nov 13, 1945           BSA:          1.498 m Patient Age:    76 years            BP:           154/101 mmHg Patient Gender: F                   HR:           80 bpm. Exam Location:  Inpatient Procedure: 2D Echo, Cardiac Doppler and Color Doppler Indications:    CVA  History:        Patient has prior history of Echocardiogram examinations, most                 recent 04/01/2020. Risk Factors:Hypertension and Dyslipidemia.  Sonographer:    Luisa Hart RDCS Referring Phys: [phone] A CALDWELL POWELL JR  Sonographer Comments: No parasternal window, suboptimal apical window and suboptimal subcostal window. Image acquisition challenging due to patient body habitus. IMPRESSIONS  1. Technically difficult; no parasternal views.  2. Left ventricular  femoral hernia over inguinal hernia but with signs of closed loop obstruction both within the abdomen and within the hernia sac with multiple loops of bowel which are herniated into the hernia at multiple levels, involving proximal  ileal or distal jejunal loops as well as distal and mid ileal loops with multiple sites of transition. 2. Based on appearance this is highly suspicious for incarcerated hernia. Surgical consultation is advised. 3. No current signs of pneumatosis but with developing interloop fluid and with moderately large amount of fluid in the hernia sac which is suspicious for at risk bowel in the setting of closed loop obstruction. 4. Compression of mesenteric veins further raises concern for potential for developing bowel ischemia. Bowel continues to enhance on the current exam. 5. Signs of complex pelvic organ prolapse as before. Aortic Atherosclerosis (ICD10-I70.0). Critical Value/emergent results were called by telephone at the time of interpretation on 11/04/2021 at 6:59 pm to provider DAVID YAO , who verbally acknowledged these results. Electronically Signed   By: Zetta Bills M.D.   On: 11/04/2021 19:01   DG CHEST PORT 1 VIEW  Result Date: 11/07/2021 CLINICAL DATA:  Abnormal CT scan. EXAM: PORTABLE CHEST 1 VIEW COMPARISON:  Lung apices from head and neck CTA earlier today. Lung bases from abdominopelvic CT 11/04/2021 FINDINGS: Subcutaneous emphysema visualized on neck CTA earlier today is faintly visualized in the right supraclavicular region. The left chest wall soft tissue gas is not definitively seen. There is no evidence of pneumomediastinum. Lung volumes are low with minor bibasilar atelectasis no pneumothorax or pleural effusion. Prominent heart size not well assessed due to anti lordotic positioning. IMPRESSION: 1. Subcutaneous emphysema visualized on neck CTA earlier today is faintly visualized in the right supraclavicular region. The left chest wall soft tissue gas is not definitively seen. 2. Low lung volumes with bibasilar atelectasis. Electronically Signed   By: Keith Rake M.D.   On: 11/07/2021 23:45   ECHOCARDIOGRAM COMPLETE  Result Date: 11/08/2021    ECHOCARDIOGRAM REPORT   Patient Name:    Hannah Downs Date of Exam: 11/08/2021 Medical Rec #:  [id number]           Height:       61.0 in Accession #:    [id number]          Weight:       116.0 lb Date of Birth:  Nov 13, 1945           BSA:          1.498 m Patient Age:    76 years            BP:           154/101 mmHg Patient Gender: F                   HR:           80 bpm. Exam Location:  Inpatient Procedure: 2D Echo, Cardiac Doppler and Color Doppler Indications:    CVA  History:        Patient has prior history of Echocardiogram examinations, most                 recent 04/01/2020. Risk Factors:Hypertension and Dyslipidemia.  Sonographer:    Luisa Hart RDCS Referring Phys: [phone] A CALDWELL POWELL JR  Sonographer Comments: No parasternal window, suboptimal apical window and suboptimal subcostal window. Image acquisition challenging due to patient body habitus. IMPRESSIONS  1. Technically difficult; no parasternal views.  2. Left ventricular  Physician Discharge Summary   Patient: Hannah Downs MRN: [id number] DOB: Aug 13, 1946  Admit date:     11/04/2021  Discharge date: 11/09/21  Discharge Physician: Hosie Poisson   PCP: Harlan Stains, MD   Recommendations at discharge:  Please follow up with PCP in one week  Please follow up with Neurology as recommended.  Please obtain cbc and BMP in one week.   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 female with past medical history of paroxysmal atrial fibrillation on Coumadin and left inguinal hernia who presents to the emergency department with lower abdominal pain, nausea, and vomiting.  She was found to have a recurrent left femoral hernia incarcerated with small bowel and causing obstruction.  She was given vitamin k and k centra prior to surgery.  She's now s/p surgery on 2/23.    Her hospital course was complicated by atrial fibrillation with RVR.  The hospitalist service was consulted to help with her RVR.  She's now converted back to sinus rhythm, but on 2/24 she developed right sided facial numbness and right arm numbness.  She told the nurse on 2/25 AM and she had a head CT which was  negative.  A subsequent MRI showed a stroke.  She's was transferred to [facility] for stroke evaluation on 2/25.   Assessment and Plan: * Femoral hernia of left side Presented with L femoral hernia which was incarcerated with small bowel and causing obstruction Now s/p laparoscopic lysis of adhesions, reduction of incarcerated recurrent left femoral hernia, laparoscopic repair of incarcerated recurrent left femoral hernia, laparascopic repiar of right femoral hernia, laparoscopic repair of right obturator hernia, TAP block bilateral Post op course per surgery  Stroke [facility])  MRI with small acute cortical/subcortical left frontoparietal infarct.  Neuro c/s.  CTA head/neck reviewed with the patient and family.  Pt currently on IV heparin, transition to eliquis  for discharge.   Echocardiogram done results reviewed.    Lipid panel showed LDl is 43, a1c is 5.4% Continue with statin.  Therapy evaluations done.  Neurology on board.   Hyponatremia- (present on admission) Mild.  With low urine sodium and exam, suspect hypovolemic Sodium has improved to 133.   Paroxysmal atrial fibrillation with RVR ([place])- (present on admission) Sinus at presentation, developed RVR in OR Now back in sinus.  Resume home dose of metoprolol on discharge.  Transition to [place] on discharge for anti coagulation.      Asymptomatic bacteriuria Has chronic indwelling catheter No fever or clear UTI symptoms Growing >100,000 pseudomonas and >100,000 klebsiella - will treat these as asymptomatic bacteruria unless she develops symptoms  Leukocytosis Resolved. follow  Hypomagnesemia Replaced  Gastroesophageal reflux disease without esophagitis- (present on admission) pepcid  Hyperlipidemia- (present  Physician Discharge Summary   Patient: Hannah Downs MRN: [id number] DOB: Aug 13, 1946  Admit date:     11/04/2021  Discharge date: 11/09/21  Discharge Physician: Hosie Poisson   PCP: Harlan Stains, MD   Recommendations at discharge:  Please follow up with PCP in one week  Please follow up with Neurology as recommended.  Please obtain cbc and BMP in one week.   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 female with past medical history of paroxysmal atrial fibrillation on Coumadin and left inguinal hernia who presents to the emergency department with lower abdominal pain, nausea, and vomiting.  She was found to have a recurrent left femoral hernia incarcerated with small bowel and causing obstruction.  She was given vitamin k and k centra prior to surgery.  She's now s/p surgery on 2/23.    Her hospital course was complicated by atrial fibrillation with RVR.  The hospitalist service was consulted to help with her RVR.  She's now converted back to sinus rhythm, but on 2/24 she developed right sided facial numbness and right arm numbness.  She told the nurse on 2/25 AM and she had a head CT which was  negative.  A subsequent MRI showed a stroke.  She's was transferred to [facility] for stroke evaluation on 2/25.   Assessment and Plan: * Femoral hernia of left side Presented with L femoral hernia which was incarcerated with small bowel and causing obstruction Now s/p laparoscopic lysis of adhesions, reduction of incarcerated recurrent left femoral hernia, laparoscopic repair of incarcerated recurrent left femoral hernia, laparascopic repiar of right femoral hernia, laparoscopic repair of right obturator hernia, TAP block bilateral Post op course per surgery  Stroke [facility])  MRI with small acute cortical/subcortical left frontoparietal infarct.  Neuro c/s.  CTA head/neck reviewed with the patient and family.  Pt currently on IV heparin, transition to eliquis  for discharge.   Echocardiogram done results reviewed.    Lipid panel showed LDl is 43, a1c is 5.4% Continue with statin.  Therapy evaluations done.  Neurology on board.   Hyponatremia- (present on admission) Mild.  With low urine sodium and exam, suspect hypovolemic Sodium has improved to 133.   Paroxysmal atrial fibrillation with RVR ([place])- (present on admission) Sinus at presentation, developed RVR in OR Now back in sinus.  Resume home dose of metoprolol on discharge.  Transition to [place] on discharge for anti coagulation.      Asymptomatic bacteriuria Has chronic indwelling catheter No fever or clear UTI symptoms Growing >100,000 pseudomonas and >100,000 klebsiella - will treat these as asymptomatic bacteruria unless she develops symptoms  Leukocytosis Resolved. follow  Hypomagnesemia Replaced  Gastroesophageal reflux disease without esophagitis- (present on admission) pepcid  Hyperlipidemia- (present  Physician Discharge Summary   Patient: Hannah Downs MRN: [id number] DOB: Aug 13, 1946  Admit date:     11/04/2021  Discharge date: 11/09/21  Discharge Physician: Hosie Poisson   PCP: Harlan Stains, MD   Recommendations at discharge:  Please follow up with PCP in one week  Please follow up with Neurology as recommended.  Please obtain cbc and BMP in one week.   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 female with past medical history of paroxysmal atrial fibrillation on Coumadin and left inguinal hernia who presents to the emergency department with lower abdominal pain, nausea, and vomiting.  She was found to have a recurrent left femoral hernia incarcerated with small bowel and causing obstruction.  She was given vitamin k and k centra prior to surgery.  She's now s/p surgery on 2/23.    Her hospital course was complicated by atrial fibrillation with RVR.  The hospitalist service was consulted to help with her RVR.  She's now converted back to sinus rhythm, but on 2/24 she developed right sided facial numbness and right arm numbness.  She told the nurse on 2/25 AM and she had a head CT which was  negative.  A subsequent MRI showed a stroke.  She's was transferred to [facility] for stroke evaluation on 2/25.   Assessment and Plan: * Femoral hernia of left side Presented with L femoral hernia which was incarcerated with small bowel and causing obstruction Now s/p laparoscopic lysis of adhesions, reduction of incarcerated recurrent left femoral hernia, laparoscopic repair of incarcerated recurrent left femoral hernia, laparascopic repiar of right femoral hernia, laparoscopic repair of right obturator hernia, TAP block bilateral Post op course per surgery  Stroke [facility])  MRI with small acute cortical/subcortical left frontoparietal infarct.  Neuro c/s.  CTA head/neck reviewed with the patient and family.  Pt currently on IV heparin, transition to eliquis  for discharge.   Echocardiogram done results reviewed.    Lipid panel showed LDl is 43, a1c is 5.4% Continue with statin.  Therapy evaluations done.  Neurology on board.   Hyponatremia- (present on admission) Mild.  With low urine sodium and exam, suspect hypovolemic Sodium has improved to 133.   Paroxysmal atrial fibrillation with RVR ([place])- (present on admission) Sinus at presentation, developed RVR in OR Now back in sinus.  Resume home dose of metoprolol on discharge.  Transition to [place] on discharge for anti coagulation.      Asymptomatic bacteriuria Has chronic indwelling catheter No fever or clear UTI symptoms Growing >100,000 pseudomonas and >100,000 klebsiella - will treat these as asymptomatic bacteruria unless she develops symptoms  Leukocytosis Resolved. follow  Hypomagnesemia Replaced  Gastroesophageal reflux disease without esophagitis- (present on admission) pepcid  Hyperlipidemia- (present  femoral hernia over inguinal hernia but with signs of closed loop obstruction both within the abdomen and within the hernia sac with multiple loops of bowel which are herniated into the hernia at multiple levels, involving proximal  ileal or distal jejunal loops as well as distal and mid ileal loops with multiple sites of transition. 2. Based on appearance this is highly suspicious for incarcerated hernia. Surgical consultation is advised. 3. No current signs of pneumatosis but with developing interloop fluid and with moderately large amount of fluid in the hernia sac which is suspicious for at risk bowel in the setting of closed loop obstruction. 4. Compression of mesenteric veins further raises concern for potential for developing bowel ischemia. Bowel continues to enhance on the current exam. 5. Signs of complex pelvic organ prolapse as before. Aortic Atherosclerosis (ICD10-I70.0). Critical Value/emergent results were called by telephone at the time of interpretation on 11/04/2021 at 6:59 pm to provider DAVID YAO , who verbally acknowledged these results. Electronically Signed   By: Zetta Bills M.D.   On: 11/04/2021 19:01   DG CHEST PORT 1 VIEW  Result Date: 11/07/2021 CLINICAL DATA:  Abnormal CT scan. EXAM: PORTABLE CHEST 1 VIEW COMPARISON:  Lung apices from head and neck CTA earlier today. Lung bases from abdominopelvic CT 11/04/2021 FINDINGS: Subcutaneous emphysema visualized on neck CTA earlier today is faintly visualized in the right supraclavicular region. The left chest wall soft tissue gas is not definitively seen. There is no evidence of pneumomediastinum. Lung volumes are low with minor bibasilar atelectasis no pneumothorax or pleural effusion. Prominent heart size not well assessed due to anti lordotic positioning. IMPRESSION: 1. Subcutaneous emphysema visualized on neck CTA earlier today is faintly visualized in the right supraclavicular region. The left chest wall soft tissue gas is not definitively seen. 2. Low lung volumes with bibasilar atelectasis. Electronically Signed   By: Keith Rake M.D.   On: 11/07/2021 23:45   ECHOCARDIOGRAM COMPLETE  Result Date: 11/08/2021    ECHOCARDIOGRAM REPORT   Patient Name:    Hannah Downs Date of Exam: 11/08/2021 Medical Rec #:  [id number]           Height:       61.0 in Accession #:    [id number]          Weight:       116.0 lb Date of Birth:  Nov 13, 1945           BSA:          1.498 m Patient Age:    76 years            BP:           154/101 mmHg Patient Gender: F                   HR:           80 bpm. Exam Location:  Inpatient Procedure: 2D Echo, Cardiac Doppler and Color Doppler Indications:    CVA  History:        Patient has prior history of Echocardiogram examinations, most                 recent 04/01/2020. Risk Factors:Hypertension and Dyslipidemia.  Sonographer:    Luisa Hart RDCS Referring Phys: [phone] A CALDWELL POWELL JR  Sonographer Comments: No parasternal window, suboptimal apical window and suboptimal subcostal window. Image acquisition challenging due to patient body habitus. IMPRESSIONS  1. Technically difficult; no parasternal views.  2. Left ventricular  Physician Discharge Summary   Patient: Hannah Downs MRN: [id number] DOB: Aug 13, 1946  Admit date:     11/04/2021  Discharge date: 11/09/21  Discharge Physician: Hosie Poisson   PCP: Harlan Stains, MD   Recommendations at discharge:  Please follow up with PCP in one week  Please follow up with Neurology as recommended.  Please obtain cbc and BMP in one week.   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 female with past medical history of paroxysmal atrial fibrillation on Coumadin and left inguinal hernia who presents to the emergency department with lower abdominal pain, nausea, and vomiting.  She was found to have a recurrent left femoral hernia incarcerated with small bowel and causing obstruction.  She was given vitamin k and k centra prior to surgery.  She's now s/p surgery on 2/23.    Her hospital course was complicated by atrial fibrillation with RVR.  The hospitalist service was consulted to help with her RVR.  She's now converted back to sinus rhythm, but on 2/24 she developed right sided facial numbness and right arm numbness.  She told the nurse on 2/25 AM and she had a head CT which was  negative.  A subsequent MRI showed a stroke.  She's was transferred to [facility] for stroke evaluation on 2/25.   Assessment and Plan: * Femoral hernia of left side Presented with L femoral hernia which was incarcerated with small bowel and causing obstruction Now s/p laparoscopic lysis of adhesions, reduction of incarcerated recurrent left femoral hernia, laparoscopic repair of incarcerated recurrent left femoral hernia, laparascopic repiar of right femoral hernia, laparoscopic repair of right obturator hernia, TAP block bilateral Post op course per surgery  Stroke [facility])  MRI with small acute cortical/subcortical left frontoparietal infarct.  Neuro c/s.  CTA head/neck reviewed with the patient and family.  Pt currently on IV heparin, transition to eliquis  for discharge.   Echocardiogram done results reviewed.    Lipid panel showed LDl is 43, a1c is 5.4% Continue with statin.  Therapy evaluations done.  Neurology on board.   Hyponatremia- (present on admission) Mild.  With low urine sodium and exam, suspect hypovolemic Sodium has improved to 133.   Paroxysmal atrial fibrillation with RVR ([place])- (present on admission) Sinus at presentation, developed RVR in OR Now back in sinus.  Resume home dose of metoprolol on discharge.  Transition to [place] on discharge for anti coagulation.      Asymptomatic bacteriuria Has chronic indwelling catheter No fever or clear UTI symptoms Growing >100,000 pseudomonas and >100,000 klebsiella - will treat these as asymptomatic bacteruria unless she develops symptoms  Leukocytosis Resolved. follow  Hypomagnesemia Replaced  Gastroesophageal reflux disease without esophagitis- (present on admission) pepcid  Hyperlipidemia- (present  Physician Discharge Summary   Patient: Hannah Downs MRN: [id number] DOB: Aug 13, 1946  Admit date:     11/04/2021  Discharge date: 11/09/21  Discharge Physician: Hosie Poisson   PCP: Harlan Stains, MD   Recommendations at discharge:  Please follow up with PCP in one week  Please follow up with Neurology as recommended.  Please obtain cbc and BMP in one week.   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 female with past medical history of paroxysmal atrial fibrillation on Coumadin and left inguinal hernia who presents to the emergency department with lower abdominal pain, nausea, and vomiting.  She was found to have a recurrent left femoral hernia incarcerated with small bowel and causing obstruction.  She was given vitamin k and k centra prior to surgery.  She's now s/p surgery on 2/23.    Her hospital course was complicated by atrial fibrillation with RVR.  The hospitalist service was consulted to help with her RVR.  She's now converted back to sinus rhythm, but on 2/24 she developed right sided facial numbness and right arm numbness.  She told the nurse on 2/25 AM and she had a head CT which was  negative.  A subsequent MRI showed a stroke.  She's was transferred to [facility] for stroke evaluation on 2/25.   Assessment and Plan: * Femoral hernia of left side Presented with L femoral hernia which was incarcerated with small bowel and causing obstruction Now s/p laparoscopic lysis of adhesions, reduction of incarcerated recurrent left femoral hernia, laparoscopic repair of incarcerated recurrent left femoral hernia, laparascopic repiar of right femoral hernia, laparoscopic repair of right obturator hernia, TAP block bilateral Post op course per surgery  Stroke [facility])  MRI with small acute cortical/subcortical left frontoparietal infarct.  Neuro c/s.  CTA head/neck reviewed with the patient and family.  Pt currently on IV heparin, transition to eliquis  for discharge.   Echocardiogram done results reviewed.    Lipid panel showed LDl is 43, a1c is 5.4% Continue with statin.  Therapy evaluations done.  Neurology on board.   Hyponatremia- (present on admission) Mild.  With low urine sodium and exam, suspect hypovolemic Sodium has improved to 133.   Paroxysmal atrial fibrillation with RVR ([place])- (present on admission) Sinus at presentation, developed RVR in OR Now back in sinus.  Resume home dose of metoprolol on discharge.  Transition to [place] on discharge for anti coagulation.      Asymptomatic bacteriuria Has chronic indwelling catheter No fever or clear UTI symptoms Growing >100,000 pseudomonas and >100,000 klebsiella - will treat these as asymptomatic bacteruria unless she develops symptoms  Leukocytosis Resolved. follow  Hypomagnesemia Replaced  Gastroesophageal reflux disease without esophagitis- (present on admission) pepcid  Hyperlipidemia- (present  Physician Discharge Summary   Patient: Hannah Downs MRN: [id number] DOB: Aug 13, 1946  Admit date:     11/04/2021  Discharge date: 11/09/21  Discharge Physician: Hosie Poisson   PCP: Harlan Stains, MD   Recommendations at discharge:  Please follow up with PCP in one week  Please follow up with Neurology as recommended.  Please obtain cbc and BMP in one week.   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 female with past medical history of paroxysmal atrial fibrillation on Coumadin and left inguinal hernia who presents to the emergency department with lower abdominal pain, nausea, and vomiting.  She was found to have a recurrent left femoral hernia incarcerated with small bowel and causing obstruction.  She was given vitamin k and k centra prior to surgery.  She's now s/p surgery on 2/23.    Her hospital course was complicated by atrial fibrillation with RVR.  The hospitalist service was consulted to help with her RVR.  She's now converted back to sinus rhythm, but on 2/24 she developed right sided facial numbness and right arm numbness.  She told the nurse on 2/25 AM and she had a head CT which was  negative.  A subsequent MRI showed a stroke.  She's was transferred to [facility] for stroke evaluation on 2/25.   Assessment and Plan: * Femoral hernia of left side Presented with L femoral hernia which was incarcerated with small bowel and causing obstruction Now s/p laparoscopic lysis of adhesions, reduction of incarcerated recurrent left femoral hernia, laparoscopic repair of incarcerated recurrent left femoral hernia, laparascopic repiar of right femoral hernia, laparoscopic repair of right obturator hernia, TAP block bilateral Post op course per surgery  Stroke [facility])  MRI with small acute cortical/subcortical left frontoparietal infarct.  Neuro c/s.  CTA head/neck reviewed with the patient and family.  Pt currently on IV heparin, transition to eliquis  for discharge.   Echocardiogram done results reviewed.    Lipid panel showed LDl is 43, a1c is 5.4% Continue with statin.  Therapy evaluations done.  Neurology on board.   Hyponatremia- (present on admission) Mild.  With low urine sodium and exam, suspect hypovolemic Sodium has improved to 133.   Paroxysmal atrial fibrillation with RVR ([place])- (present on admission) Sinus at presentation, developed RVR in OR Now back in sinus.  Resume home dose of metoprolol on discharge.  Transition to [place] on discharge for anti coagulation.      Asymptomatic bacteriuria Has chronic indwelling catheter No fever or clear UTI symptoms Growing >100,000 pseudomonas and >100,000 klebsiella - will treat these as asymptomatic bacteruria unless she develops symptoms  Leukocytosis Resolved. follow  Hypomagnesemia Replaced  Gastroesophageal reflux disease without esophagitis- (present on admission) pepcid  Hyperlipidemia- (present  femoral hernia over inguinal hernia but with signs of closed loop obstruction both within the abdomen and within the hernia sac with multiple loops of bowel which are herniated into the hernia at multiple levels, involving proximal  ileal or distal jejunal loops as well as distal and mid ileal loops with multiple sites of transition. 2. Based on appearance this is highly suspicious for incarcerated hernia. Surgical consultation is advised. 3. No current signs of pneumatosis but with developing interloop fluid and with moderately large amount of fluid in the hernia sac which is suspicious for at risk bowel in the setting of closed loop obstruction. 4. Compression of mesenteric veins further raises concern for potential for developing bowel ischemia. Bowel continues to enhance on the current exam. 5. Signs of complex pelvic organ prolapse as before. Aortic Atherosclerosis (ICD10-I70.0). Critical Value/emergent results were called by telephone at the time of interpretation on 11/04/2021 at 6:59 pm to provider DAVID YAO , who verbally acknowledged these results. Electronically Signed   By: Zetta Bills M.D.   On: 11/04/2021 19:01   DG CHEST PORT 1 VIEW  Result Date: 11/07/2021 CLINICAL DATA:  Abnormal CT scan. EXAM: PORTABLE CHEST 1 VIEW COMPARISON:  Lung apices from head and neck CTA earlier today. Lung bases from abdominopelvic CT 11/04/2021 FINDINGS: Subcutaneous emphysema visualized on neck CTA earlier today is faintly visualized in the right supraclavicular region. The left chest wall soft tissue gas is not definitively seen. There is no evidence of pneumomediastinum. Lung volumes are low with minor bibasilar atelectasis no pneumothorax or pleural effusion. Prominent heart size not well assessed due to anti lordotic positioning. IMPRESSION: 1. Subcutaneous emphysema visualized on neck CTA earlier today is faintly visualized in the right supraclavicular region. The left chest wall soft tissue gas is not definitively seen. 2. Low lung volumes with bibasilar atelectasis. Electronically Signed   By: Keith Rake M.D.   On: 11/07/2021 23:45   ECHOCARDIOGRAM COMPLETE  Result Date: 11/08/2021    ECHOCARDIOGRAM REPORT   Patient Name:    Hannah Downs Date of Exam: 11/08/2021 Medical Rec #:  [id number]           Height:       61.0 in Accession #:    [id number]          Weight:       116.0 lb Date of Birth:  Nov 13, 1945           BSA:          1.498 m Patient Age:    76 years            BP:           154/101 mmHg Patient Gender: F                   HR:           80 bpm. Exam Location:  Inpatient Procedure: 2D Echo, Cardiac Doppler and Color Doppler Indications:    CVA  History:        Patient has prior history of Echocardiogram examinations, most                 recent 04/01/2020. Risk Factors:Hypertension and Dyslipidemia.  Sonographer:    Luisa Hart RDCS Referring Phys: [phone] A CALDWELL POWELL JR  Sonographer Comments: No parasternal window, suboptimal apical window and suboptimal subcostal window. Image acquisition challenging due to patient body habitus. IMPRESSIONS  1. Technically difficult; no parasternal views.  2. Left ventricular

## 2021-11-09 NOTE — Progress Notes (Addendum)
5 Days Post-Op  Subjective: CC: Doing well. No abdominal pain. Tolerating diet without n/v. BM yesterday. Foley in place. Reports neurologic symptoms have resolved. No n/t/w.  Int tachycardia overnight requiring prn metoprolol. Cleared by PT/OT. Plans to stay with her husband and daughter who can provide 24 hour supervision. Ambulated in the halls yesterday without a walker.   Objective: Vital signs in last 24 hours: Temp:  [97.4 F (36.3 C)-98.9 F (37.2 C)] 98.4 F (36.9 C) (02/27 0859) Pulse Rate:  [72-106] 106 (02/27 0859) Resp:  [16-20] 20 (02/27 0859) BP: (123-155)/(75-98) 127/75 (02/27 0859) SpO2:  [94 %-99 %] 95 % (02/27 0859) Last BM Date : 11/08/21  Intake/Output from previous day: 02/26 0701 - 02/27 0700 In: 475.6 [P.O.:120; I.V.:355.6] Out: 1050 [Urine:1050] Intake/Output this shift: No intake/output data recorded.  PE: Gen:  Alert, NAD, pleasant Card:  Tachycardic  Pulm:  CTA b/l, normal rate & effort Abd: Soft, ND, appropriately tender around incisions, port sites clean, dry and intact with steri-strips present. Lower abdominal port site with small amount of surrounding ecchymosis. Loose skin in the left groin consistent with chronically distended tissue from where she had a chronically incarcerated hernia there, but this is very soft and easily becomes flat today. It is not tense, there are not overlying skin changes. No major hematoma.   Skin: warm and dry, no rashes 3 Neuro: CN 3-12 grossly intact. Grossly equal grip strength b/l, hip flexion strength and dorsiflexion/plantarflexion. MAE's.  Psych: A&Ox3   Lab Results:  Recent Labs    11/08/21 0955 11/09/21 0301  WBC 10.3 7.1  HGB 11.4* 10.0*  HCT 33.6* 29.3*  PLT 272 251   BMET Recent Labs    11/07/21 1558 11/08/21 0955  NA 131* 133*  K 4.0 4.3  CL 97* 99  CO2 27 25  GLUCOSE 154* 111*  BUN 9 7*  CREATININE 0.53 0.53  CALCIUM 8.6* 8.8*   PT/INR Recent Labs    11/07/21 0714  11/09/21 0301  LABPROT 15.6* 15.9*  INR 1.2 1.3*   CMP     Component Value Date/Time   NA 133 (L) 11/08/2021 0955   NA 137 08/03/2021 1247   K 4.3 11/08/2021 0955   CL 99 11/08/2021 0955   CO2 25 11/08/2021 0955   GLUCOSE 111 (H) 11/08/2021 0955   BUN 7 (L) 11/08/2021 0955   BUN 14 08/03/2021 1247   CREATININE 0.53 11/08/2021 0955   CALCIUM 8.8 (L) 11/08/2021 0955   PROT 6.0 (L) 11/06/2021 0520   ALBUMIN 3.4 (L) 11/06/2021 0520   AST 28 11/06/2021 0520   ALT 17 11/06/2021 0520   ALKPHOS 37 (L) 11/06/2021 0520   BILITOT 1.9 (H) 11/06/2021 0520   GFRNONAA >60 11/08/2021 0955   GFRAA >90 06/27/2014 0425   Lipase  No results found for: LIPASE  Studies/Results: CT ANGIO HEAD W OR WO CONTRAST  Result Date: 11/07/2021 CLINICAL DATA:  Stroke, follow-up EXAM: CT ANGIOGRAPHY HEAD AND NECK TECHNIQUE: Multidetector CT imaging of the head and neck was performed using the standard protocol during bolus administration of intravenous contrast. Multiplanar CT image reconstructions and MIPs were obtained to evaluate the vascular anatomy. Carotid stenosis measurements (when applicable) are obtained utilizing NASCET criteria, using the distal internal carotid diameter as the denominator. RADIATION DOSE REDUCTION: This exam was performed according to the departmental dose-optimization program which includes automated exposure control, adjustment of the mA and/or kV according to patient size and/or use of iterative reconstruction technique. CONTRAST:  75mL OMNIPAQUE IOHEXOL 350 MG/ML SOLN COMPARISON:  Correlation made with recent CT and MR imaging FINDINGS: CTA NECK Aortic arch: Great vessel origins are patent. Right carotid system: Patent.  No stenosis. Left carotid system: Patent.  No stenosis. Vertebral arteries: Patent. Right vertebral is dominant. No stenosis. Skeleton: Cervical spine degenerative changes. Other neck: Foci of soft tissue air present bilaterally in the neck, supraclavicular regions,  and ventral chest wall. Subcentimeter right thyroid nodule which no ultrasound follow-up is recommended by current guidelines. Upper chest: As above.  Included upper lungs are clear. Review of the MIP images confirms the above findings CTA HEAD Anterior circulation: Intracranial internal carotid arteries are patent. Anterior and middle cerebral arteries are patent. Specifically, there is no proximal left MCA occlusion. Posterior circulation: Intracranial vertebral arteries are patent. Basilar artery is patent. Major cerebellar artery origins are patent. Bilateral posterior communicating arteries are present. Posterior cerebral arteries are patent. Venous sinuses: Patent as allowed by contrast bolus timing. Review of the MIP images confirms the above findings IMPRESSION: No large vessel occlusion, hemodynamically significant stenosis, or evidence of dissection. Scattered foci of air in the neck soft tissues, supraclavicular regions, and imaged ventral chest wall. Electronically Signed   By: Guadlupe Spanish M.D.   On: 11/07/2021 20:00   CT HEAD WO CONTRAST ( )  Result Date: 11/07/2021 CLINICAL DATA:  Syncope/presyncope with cerebrovascular cause suspected. Facial and right arm numbness/tingling starting last night and recurring this morning. EXAM: CT HEAD WITHOUT CONTRAST TECHNIQUE: Contiguous axial images were obtained from the base of the skull through the vertex without intravenous contrast. RADIATION DOSE REDUCTION: This exam was performed according to the departmental dose-optimization program which includes automated exposure control, adjustment of the mA and/or kV according to patient size and/or use of iterative reconstruction technique. COMPARISON:  None. FINDINGS: Brain: No evidence of acute infarction, hemorrhage, hydrocephalus, extra-axial collection or mass lesion/mass effect. Vascular: No hyperdense vessel or unexpected calcification. Skull: Normal. Negative for fracture or focal lesion.  Sinuses/Orbits: No acute finding. IMPRESSION: Negative head CT.  No explanation for symptoms. Electronically Signed   By: Tiburcio Pea M.D.   On: 11/07/2021 12:21   CT ANGIO NECK W OR WO CONTRAST  Result Date: 11/07/2021 CLINICAL DATA:  Stroke, follow-up EXAM: CT ANGIOGRAPHY HEAD AND NECK TECHNIQUE: Multidetector CT imaging of the head and neck was performed using the standard protocol during bolus administration of intravenous contrast. Multiplanar CT image reconstructions and MIPs were obtained to evaluate the vascular anatomy. Carotid stenosis measurements (when applicable) are obtained utilizing NASCET criteria, using the distal internal carotid diameter as the denominator. RADIATION DOSE REDUCTION: This exam was performed according to the departmental dose-optimization program which includes automated exposure control, adjustment of the mA and/or kV according to patient size and/or use of iterative reconstruction technique. CONTRAST:  75mL OMNIPAQUE IOHEXOL 350 MG/ML SOLN COMPARISON:  Correlation made with recent CT and MR imaging FINDINGS: CTA NECK Aortic arch: Great vessel origins are patent. Right carotid system: Patent.  No stenosis. Left carotid system: Patent.  No stenosis. Vertebral arteries: Patent. Right vertebral is dominant. No stenosis. Skeleton: Cervical spine degenerative changes. Other neck: Foci of soft tissue air present bilaterally in the neck, supraclavicular regions, and ventral chest wall. Subcentimeter right thyroid nodule which no ultrasound follow-up is recommended by current guidelines. Upper chest: As above.  Included upper lungs are clear. Review of the MIP images confirms the above findings CTA HEAD Anterior circulation: Intracranial internal carotid arteries are patent. Anterior and middle cerebral arteries are  patent. Specifically, there is no proximal left MCA occlusion. Posterior circulation: Intracranial vertebral arteries are patent. Basilar artery is patent. Major  cerebellar artery origins are patent. Bilateral posterior communicating arteries are present. Posterior cerebral arteries are patent. Venous sinuses: Patent as allowed by contrast bolus timing. Review of the MIP images confirms the above findings IMPRESSION: No large vessel occlusion, hemodynamically significant stenosis, or evidence of dissection. Scattered foci of air in the neck soft tissues, supraclavicular regions, and imaged ventral chest wall. Electronically Signed   By: Guadlupe Spanish M.D.   On: 11/07/2021 20:00   MR BRAIN WO CONTRAST  Result Date: 11/07/2021 CLINICAL DATA:  Neuro deficit, acute, stroke suspected EXAM: MRI HEAD WITHOUT CONTRAST TECHNIQUE: Multiplanar, multiecho pulse sequences of the brain and surrounding structures were obtained without intravenous contrast. COMPARISON:  None. FINDINGS: Brain: Cortical/subcortical mildly reduced diffusion in the left perirolandic frontoparietal lobes extending to the margin of the body left lateral ventricle. A focus of susceptibility inferior to this region likely reflects chronic microhemorrhage. There is no intracranial mass or mass effect. There is no hydrocephalus or extra-axial fluid collection. Prominence of the ventricles and sulci reflects minor parenchymal volume loss. Few patchy foci of T2 hyperintensity in the supratentorial white matter are nonspecific but may reflect minor chronic microvascular ischemic changes. Vascular: Major vessel flow voids at the skull base are preserved. Skull and upper cervical spine: Normal marrow signal is preserved. Sinuses/Orbits: Paranasal sinuses are aerated. Orbits are unremarkable. Other: Sella is unremarkable.  Mastoid air cells are clear. IMPRESSION: Small acute cortical/subcortical left frontoparietal infarct involving the perirolandic region. Minor chronic microvascular ischemic changes. Electronically Signed   By: Guadlupe Spanish M.D.   On: 11/07/2021 17:54   DG CHEST PORT 1 VIEW  Result Date:  11/07/2021 CLINICAL DATA:  Abnormal CT scan. EXAM: PORTABLE CHEST 1 VIEW COMPARISON:  Lung apices from head and neck CTA earlier today. Lung bases from abdominopelvic CT 11/04/2021 FINDINGS: Subcutaneous emphysema visualized on neck CTA earlier today is faintly visualized in the right supraclavicular region. The left chest wall soft tissue gas is not definitively seen. There is no evidence of pneumomediastinum. Lung volumes are low with minor bibasilar atelectasis no pneumothorax or pleural effusion. Prominent heart size not well assessed due to anti lordotic positioning. IMPRESSION: 1. Subcutaneous emphysema visualized on neck CTA earlier today is faintly visualized in the right supraclavicular region. The left chest wall soft tissue gas is not definitively seen. 2. Low lung volumes with bibasilar atelectasis. Electronically Signed   By: Narda Rutherford M.D.   On: 11/07/2021 23:45   ECHOCARDIOGRAM COMPLETE  Result Date: 11/08/2021    ECHOCARDIOGRAM REPORT   Patient Name:   Hannah Downs Date of Exam: 11/08/2021 Medical Rec #:  [id number]           Height:       61.0 in Accession #:    [id number]          Weight:       116.0 lb Date of Birth:  1946/08/21           BSA:          1.498 m Patient Age:    76 years            BP:           154/101 mmHg Patient Gender: F                   HR:  80 bpm. Exam Location:  Inpatient Procedure: 2D Echo, Cardiac Doppler and Color Doppler Indications:    CVA  History:        Patient has prior history of Echocardiogram examinations, most                 recent 04/01/2020. Risk Factors:Hypertension and Dyslipidemia.  Sonographer:    Neomia Dear RDCS Referring Phys: [phone] A CALDWELL POWELL JR  Sonographer Comments: No parasternal window, suboptimal apical window and suboptimal subcostal window. Image acquisition challenging due to patient body habitus. IMPRESSIONS  1. Technically difficult; no parasternal views.  2. Left ventricular ejection fraction, by  estimation, is 60 to 65%. The left ventricle has normal function. The left ventricle has no regional wall motion abnormalities. Left ventricular diastolic parameters are consistent with Grade II diastolic dysfunction (pseudonormalization).  3. Right ventricular systolic function is normal. The right ventricular size is normal.  4. Left atrial size was mildly dilated.  5. Right atrial size was mildly dilated.  6. The mitral valve is normal in structure. Trivial mitral valve regurgitation. No evidence of mitral stenosis.  7. The aortic valve was not assessed. Aortic valve regurgitation is not visualized. No aortic stenosis is present.  8. Pulmonic valve regurgitation Not assessed.  9. The inferior vena cava is normal in size with greater than 50% respiratory variability, suggesting right atrial pressure of 3 mmHg. FINDINGS  Left Ventricle: Left ventricular ejection fraction, by estimation, is 60 to 65%. The left ventricle has normal function. The left ventricle has no regional wall motion abnormalities. The left ventricular internal cavity size was normal in size. There is  no left ventricular hypertrophy. Left ventricular diastolic parameters are consistent with Grade II diastolic dysfunction (pseudonormalization). Right Ventricle: The right ventricular size is normal. Right ventricular systolic function is normal. Left Atrium: Left atrial size was mildly dilated. Right Atrium: Right atrial size was mildly dilated. Pericardium: There is no evidence of pericardial effusion. Mitral Valve: The mitral valve is normal in structure. Trivial mitral valve regurgitation. No evidence of mitral valve stenosis. MV peak gradient, 2.4 mmHg. The mean mitral valve gradient is 1.0 mmHg. Tricuspid Valve: The tricuspid valve is normal in structure. Tricuspid valve regurgitation is not demonstrated. No evidence of tricuspid stenosis. Aortic Valve: The aortic valve was not assessed. Aortic valve regurgitation is not visualized. No aortic  stenosis is present. Aortic valve mean gradient measures 4.0 mmHg. Aortic valve peak gradient measures 8.1 mmHg. Pulmonic Valve: The pulmonic valve was not assessed. Pulmonic valve regurgitation Not assessed. Aorta: Aortic root could not be assessed. Venous: The inferior vena cava is normal in size with greater than 50% respiratory variability, suggesting right atrial pressure of 3 mmHg. IAS/Shunts: The interatrial septum was not well visualized. Additional Comments: Technically difficult; no parasternal views.   LV Volumes (MOD) LV vol d, MOD A2C: 22.4 ml Diastology LV vol d, MOD A4C: 31.8 ml LV e' medial:    6.93 cm/s LV vol s, MOD A2C: 10.8 ml LV E/e' medial:  10.4 LV vol s, MOD A4C: 16.7 ml LV e' lateral:   6.25 cm/s LV SV MOD A2C:     11.6 ml LV E/e' lateral: 11.6 LV SV MOD A4C:     31.8 ml LV SV MOD BP:      14.6 ml RIGHT VENTRICLE RV Basal diam:  3.90 cm RV Mid diam:    2.10 cm RV S prime:     26.80 cm/s TAPSE (M-mode): 3.3 cm LEFT ATRIUM  Index        RIGHT ATRIUM           Index LA Vol (A2C): 42.4 ml 28.30 ml/m  RA Area:     17.30 cm                                    RA Volume:   43.60 ml  29.10 ml/m  AORTIC VALVE AV Vmax:           142.00 cm/s AV Vmean:          95.100 cm/s AV VTI:            0.235 m AV Peak Grad:      8.1 mmHg AV Mean Grad:      4.0 mmHg LVOT Vmax:         110.00 cm/s LVOT Vmean:        71.000 cm/s LVOT VTI:          0.176 m LVOT/AV VTI ratio: 0.75 MITRAL VALVE MV Peak grad: 2.4 mmHg     SHUNTS MV Mean grad: 1.0 mmHg     Systemic VTI: 0.18 m MV Vmax:      0.78 m/s MV Vmean:     56.5 cm/s MV E velocity: 72.30 cm/s MV A velocity: 51.90 cm/s MV E/A ratio:  1.39 Olga Millers MD Electronically signed by Olga Millers MD Signature Date/Time: 11/08/2021/11:09:01 AM    Final     Anti-infectives: Anti-infectives (From admission, onward)    Start     Dose/Rate Route Frequency Ordered Stop   11/05/21 0600  ceFAZolin (ANCEF) IVPB 2g/100 mL premix        2 g 200 mL/hr over 30  Minutes Intravenous Every 8 hours 11/05/21 0247 11/05/21 1421   11/04/21 2130  clindamycin (CLEOCIN) IVPB 900 mg       See Hyperspace for full Linked Orders Report.   900 mg 100 mL/hr over 30 Minutes Intravenous On call to O.R. 11/04/21 2027 11/04/21 2237   11/04/21 2115  gentamicin (GARAMYCIN) 260 mg in dextrose 5 % 100 mL IVPB       See Hyperspace for full Linked Orders Report.   5 mg/kg  52.6 kg 106.5 mL/hr over 60 Minutes Intravenous On call to O.R. 11/04/21 2027 11/04/21 2216   11/04/21 1945  piperacillin-tazobactam (ZOSYN) IVPB 3.375 g        3.375 g 100 mL/hr over 30 Minutes Intravenous  Once 11/04/21 1940 11/04/21 2050        Assessment/Plan POD 4 S/p emergent laparoscopic LOA, reduction left femoral hernia, repair of left femoral hernia with mesh, repair of R femoral and R obturator hernias with mesh. 11/05/21 Dr. Michaell Cowing - Tolerating diet and having bowel function. Pain well controlled. Mobilizing and cleared by PT/OT. Overall stable from a surgical standpoint. TRH graciously has agreed to take over as primary.  - There is loose skin in the left groin that after discussion with attending last week seems consistent with chronically distended tissue from where she had a chronically incarcerated hernia there. This is stable to slightly improved. It is not tense, there are not overlying skin changes. No major hematoma.     FEN: Soft diet, VTE: SCDs, heparin gtt currently. Hgb 10 from 11.7 (monitor) ID: Ancef periop. No further abx indicated from our standpoint Foley: chronic. 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 Neuro consulted. Echo done. Follow-up in the stroke clinic after discharge at [facility] with Dr. Pearlean Brownie. PT/OT/SLP Hx atrial fibrillation, on coumadin at home - On heparin gtt. Hep level 0.3. INR 1. Awaiting to see if patient will have coverage for DOAC vs needs to bridge back on Coumadin. Intermittent tachycardia at home.  Takes Toprol-xl 100mg  qd at home - currently on 12.5mg  PO BID. F/u cards.  + UCx - > 100,000 klebsiella and pseudomonas. Reviewed TRH note. Tx as asymptomatic bacteruria. Afebrile. WBC wnl. Has chronic indwelling foley  AKI - Resolved COVID+ - previous + test. Not on precuations hyponatremia  HLD  HTN   This care is post op    LOS: 5 days    Jacinto Halim , [facility] 11/09/2021, 9:32 AM Please see Amion for pager number during day hours 7:00am-4:30pm

## 2021-11-09 NOTE — TOC Benefit Eligibility Note (Signed)
Patient Product/process development scientist completed.    The patient does not have any prescription insurance coverage.  Roland Earl, CPhT Pharmacy Patient Advocate Specialist [facility] Patient Advocate Team Direct Number: [phone]  Fax: [phone]

## 2021-11-09 NOTE — TOC Transition Note (Signed)
Transition of Care [facility]) - CM/SW Discharge Note   Patient Details  Name: Hannah Downs MRN: [id number] Date of Birth: Nov 18, 1945  [facility] CM/SW Contact:  Harriet Masson, RN Phone Number: 11/09/2021, 12:28 PM   Clinical Narrative:    Patient stable for discharge. Patient is starting on eliquis. Patient has no prescription plan but still wants to try Eliquis. This RNCM gave patient Eliquis copay card for 1st month free and gave patient form to take to her PCP to fill out to try to get medication assistance for Eliquis. Patient has transportation home.  No other TOC needs.    Final next level of care: Home/Self Care Barriers to Discharge: Barriers Resolved   Patient Goals and CMS Choice Patient states their goals for this hospitalization and ongoing recovery are:: return home      Discharge Placement                       Discharge Plan and Services   Discharge Planning Services: CM Consult                                 Social Determinants of Health (SDOH) Interventions     Readmission Risk Interventions No flowsheet data found.

## 2021-11-09 NOTE — Care Management Important Message (Signed)
Important Message  Patient Details  Name: Hannah Downs MRN: [id number] Date of Birth: [birth year]   Medicare Important Message Given:  Yes     Dorena Bodo 11/09/2021, 2:46 PM

## 2021-11-09 NOTE — Progress Notes (Signed)
ANTICOAGULATION CONSULT NOTE - Follow Up Consult  Pharmacy Consult for Heparin Indication: atrial fibrillation and stroke  Allergies  Allergen Reactions   Lisinopril Cough    Patient Measurements: Height: 5\' 1"  (154.9 cm) Weight: 52.6 kg (116 lb) IBW/kg (Calculated) : 47.8 kg Heparin Dosing Weight: 52.6 kg  Vital Signs: Temp: 98.9 F (37.2 C) (02/27 0323) Temp Source: Oral (02/27 0323) BP: 123/85 (02/27 0323) Pulse Rate: 72 (02/27 0323)  Labs: Recent Labs    11/07/21 0714 11/07/21 1558 11/08/21 0206 11/08/21 0955 11/08/21 1209 11/09/21 0301  HGB 10.9*  --   --  11.4*  --  10.0*  HCT 33.6*  --   --  33.6*  --  29.3*  PLT 206  --   --  272  --  251  LABPROT 15.6*  --   --   --   --  15.9*  INR 1.2  --   --   --   --  1.3*  HEPARINUNFRC 0.38 0.29* 0.22*  --  0.39 0.30  CREATININE 0.49 0.53  --  0.53  --   --      Estimated Creatinine Clearance: 45.1 mL/min (by C-G formula based on SCr of 0.53 mg/dL).  Assessment: 76 yo female presented to [facility] ED on 2/22 with lower abdominal pain, nausea, and vomiting, found to have recurrent left femoral hernia incarcerated with small bowel and causing obstruction. PTA the patient is on warfarin for atrial fibrillation. INR 2.7 upon admission and the patient was given vitamin K IV 10 mg and KCentra on 2/22. The patient is s/p lysis of adhesions and reduction of recurrent hernia on 2/23. Pharmacy is consulted to dose heparin.   PTA Warfarin Regimen: warfarin PO 5 mg daily  Of note, the patient has been unable to switch to a DOAC due to cost. A transitions of care consult has been placed to evaluate cost and patient assistance options for apixaban and rivaroxaban.   Heparin level therapeutic this AM  Goal of Therapy:  Heparin level 0.3-0.5 units/ml Monitor platelets by anticoagulation protocol: Yes   Plan:  Continue heparin IV at 1100 units/hr Obtain a daily heparin level and CBC Monitor for signs and symptoms of  bleeding Evaluate the patient's cost of apixaban and rivaroxaban Follow plans to resume oral anticoagulation (warfarin vs. DOAC)  Thank you 3/23, PharmD 11/09/2021 8:38 AM  Please check [email or web address] for unit-specific pharmacy phone numbers.

## 2021-11-09 NOTE — Progress Notes (Signed)
SLP Cancellation Note  Patient Details Name: Hannah Downs MRN: [id number] DOB: 1945/10/27   Cancelled treatment:       Reason Eval/Treat Not Completed: SLP screened, no needs identified, will sign off.    Hazael Olveda, Riley Nearing 11/09/2021, 12:36 PM

## 2021-11-09 NOTE — TOC Benefit Eligibility Note (Signed)
Transition of Care [facility]) Benefit Eligibility Note    Patient Details  Name: AALAYA HARMS MRN: [id number] Date of Birth: [birth year]   Medication/Dose: Alveda Reasons 15 MG BID  and   XARELTO 20 MG DAILY  ,  ELIQUIS  5 MG BID        Prescription Coverage Preferred Pharmacy: [facility] [place] with Person/Company/Phone Number:: [employer] @ [id number] #  [phone]           Additional Notes: NO PRESCRITION COVERAGE, PT'S  HAS  A DISCOUNT CARD    Memory Argue Phone Number: 11/09/2021, 10:14 AM

## 2021-11-10 ENCOUNTER — Emergency Department (HOSPITAL_COMMUNITY): Payer: Medicare Other

## 2021-11-10 ENCOUNTER — Emergency Department (HOSPITAL_COMMUNITY)
Admission: EM | Admit: 2021-11-10 | Discharge: 2021-11-11 | Disposition: A | Discharge status: Home / Self Care | Payer: Medicare Other | Source: Home / Self Care

## 2021-11-10 ENCOUNTER — Telehealth: Payer: Self-pay | Admitting: Cardiovascular Disease

## 2021-11-10 ENCOUNTER — Ambulatory Visit (INDEPENDENT_AMBULATORY_CARE_PROVIDER_SITE_OTHER): Payer: Medicare Other | Admitting: Cardiovascular Disease

## 2021-11-10 ENCOUNTER — Encounter (HOSPITAL_COMMUNITY): Payer: Self-pay | Admitting: Surgery

## 2021-11-10 ENCOUNTER — Other Ambulatory Visit: Payer: Self-pay

## 2021-11-10 ENCOUNTER — Encounter (HOSPITAL_COMMUNITY): Payer: Self-pay

## 2021-11-10 VITALS — BP 140/80 | HR 78 | Ht 61.0 in | Wt 124.0 lb

## 2021-11-10 DIAGNOSIS — I480 Paroxysmal atrial fibrillation: Secondary | ICD-10-CM

## 2021-11-10 DIAGNOSIS — K91872 Postprocedural seroma of a digestive system organ or structure following a digestive system procedure: Secondary | ICD-10-CM | POA: Diagnosis not present

## 2021-11-10 DIAGNOSIS — E871 Hypo-osmolality and hyponatremia: Secondary | ICD-10-CM | POA: Diagnosis not present

## 2021-11-10 DIAGNOSIS — I10 Essential (primary) hypertension: Secondary | ICD-10-CM | POA: Diagnosis not present

## 2021-11-10 DIAGNOSIS — I4891 Unspecified atrial fibrillation: Secondary | ICD-10-CM | POA: Insufficient documentation

## 2021-11-10 DIAGNOSIS — K8689 Other specified diseases of pancreas: Secondary | ICD-10-CM | POA: Diagnosis not present

## 2021-11-10 DIAGNOSIS — Z809 Family history of malignant neoplasm, unspecified: Secondary | ICD-10-CM | POA: Diagnosis not present

## 2021-11-10 DIAGNOSIS — M8580 Other specified disorders of bone density and structure, unspecified site: Secondary | ICD-10-CM | POA: Diagnosis present

## 2021-11-10 DIAGNOSIS — Z7901 Long term (current) use of anticoagulants: Secondary | ICD-10-CM | POA: Insufficient documentation

## 2021-11-10 DIAGNOSIS — Z823 Family history of stroke: Secondary | ICD-10-CM | POA: Diagnosis not present

## 2021-11-10 DIAGNOSIS — E7800 Pure hypercholesterolemia, unspecified: Secondary | ICD-10-CM | POA: Diagnosis present

## 2021-11-10 DIAGNOSIS — Y838 Other surgical procedures as the cause of abnormal reaction of the patient, or of later complication, without mention of misadventure at the time of the procedure: Secondary | ICD-10-CM | POA: Diagnosis present

## 2021-11-10 DIAGNOSIS — Z79899 Other long term (current) drug therapy: Secondary | ICD-10-CM | POA: Diagnosis not present

## 2021-11-10 DIAGNOSIS — R0902 Hypoxemia: Secondary | ICD-10-CM | POA: Diagnosis not present

## 2021-11-10 DIAGNOSIS — Z888 Allergy status to other drugs, medicaments and biological substances status: Secondary | ICD-10-CM | POA: Diagnosis not present

## 2021-11-10 DIAGNOSIS — I639 Cerebral infarction, unspecified: Secondary | ICD-10-CM | POA: Diagnosis not present

## 2021-11-10 DIAGNOSIS — Z96641 Presence of right artificial hip joint: Secondary | ICD-10-CM | POA: Diagnosis present

## 2021-11-10 DIAGNOSIS — R42 Dizziness and giddiness: Secondary | ICD-10-CM | POA: Diagnosis not present

## 2021-11-10 DIAGNOSIS — Z5321 Procedure and treatment not carried out due to patient leaving prior to being seen by health care provider: Secondary | ICD-10-CM | POA: Insufficient documentation

## 2021-11-10 DIAGNOSIS — G319 Degenerative disease of nervous system, unspecified: Secondary | ICD-10-CM | POA: Diagnosis not present

## 2021-11-10 DIAGNOSIS — M1990 Unspecified osteoarthritis, unspecified site: Secondary | ICD-10-CM | POA: Diagnosis present

## 2021-11-10 DIAGNOSIS — R1111 Vomiting without nausea: Secondary | ICD-10-CM | POA: Diagnosis not present

## 2021-11-10 DIAGNOSIS — K219 Gastro-esophageal reflux disease without esophagitis: Secondary | ICD-10-CM | POA: Diagnosis present

## 2021-11-10 DIAGNOSIS — Z8673 Personal history of transient ischemic attack (TIA), and cerebral infarction without residual deficits: Secondary | ICD-10-CM | POA: Diagnosis not present

## 2021-11-10 DIAGNOSIS — K449 Diaphragmatic hernia without obstruction or gangrene: Secondary | ICD-10-CM | POA: Diagnosis not present

## 2021-11-10 DIAGNOSIS — Z20822 Contact with and (suspected) exposure to covid-19: Secondary | ICD-10-CM | POA: Diagnosis present

## 2021-11-10 DIAGNOSIS — R29700 NIHSS score 0: Secondary | ICD-10-CM | POA: Diagnosis present

## 2021-11-10 DIAGNOSIS — I63441 Cerebral infarction due to embolism of right cerebellar artery: Secondary | ICD-10-CM | POA: Diagnosis present

## 2021-11-10 DIAGNOSIS — K4090 Unilateral inguinal hernia, without obstruction or gangrene, not specified as recurrent: Secondary | ICD-10-CM | POA: Diagnosis not present

## 2021-11-10 DIAGNOSIS — R110 Nausea: Secondary | ICD-10-CM | POA: Diagnosis not present

## 2021-11-10 DIAGNOSIS — N819 Female genital prolapse, unspecified: Secondary | ICD-10-CM | POA: Diagnosis not present

## 2021-11-10 DIAGNOSIS — E785 Hyperlipidemia, unspecified: Secondary | ICD-10-CM | POA: Diagnosis not present

## 2021-11-10 HISTORY — DX: Cerebral infarction, unspecified: I63.9

## 2021-11-10 LAB — URINALYSIS, ROUTINE W REFLEX MICROSCOPIC
Bacteria, UA: NONE SEEN
Bilirubin Urine: NEGATIVE
Glucose, UA: 50 mg/dL — AB
Hgb urine dipstick: NEGATIVE
Ketones, ur: 80 mg/dL — AB
Nitrite: NEGATIVE
Protein, ur: NEGATIVE mg/dL
Specific Gravity, Urine: 1.015 (ref 1.005–1.030)
pH: 7 (ref 5.0–8.0)

## 2021-11-10 LAB — COMPREHENSIVE METABOLIC PANEL
ALT: 55 U/L — ABNORMAL HIGH (ref 0–44)
AST: 72 U/L — ABNORMAL HIGH (ref 15–41)
Albumin: 3.3 g/dL — ABNORMAL LOW (ref 3.5–5.0)
Alkaline Phosphatase: 62 U/L (ref 38–126)
Anion gap: 8 (ref 5–15)
BUN: 9 mg/dL (ref 8–23)
CO2: 27 mmol/L (ref 22–32)
Calcium: 8.9 mg/dL (ref 8.9–10.3)
Chloride: 96 mmol/L — ABNORMAL LOW (ref 98–111)
Creatinine, Ser: 0.51 mg/dL (ref 0.44–1.00)
GFR, Estimated: >60 mL/min (ref >60)
Glucose, Bld: 161 mg/dL — ABNORMAL HIGH (ref 70–99)
Potassium: 4.4 mmol/L (ref 3.5–5.1)
Sodium: 131 mmol/L — ABNORMAL LOW (ref 135–145)
Total Bilirubin: 1.9 mg/dL — ABNORMAL HIGH (ref 0.3–1.2)
Total Protein: 6 g/dL — ABNORMAL LOW (ref 6.5–8.1)

## 2021-11-10 LAB — CBC WITH DIFFERENTIAL/PLATELET
Abs Immature Granulocytes: 0.04 10*3/uL (ref 0.00–0.07)
Basophils Absolute: 0 10*3/uL (ref 0.0–0.1)
Basophils Relative: 0 %
Eosinophils Absolute: 0 10*3/uL (ref 0.0–0.5)
Eosinophils Relative: 0 %
HCT: 32.2 % — ABNORMAL LOW (ref 36.0–46.0)
Hemoglobin: 10.9 g/dL — ABNORMAL LOW (ref 12.0–15.0)
Immature Granulocytes: 0 %
Lymphocytes Relative: 5 %
Lymphs Abs: 0.5 10*3/uL — ABNORMAL LOW (ref 0.7–4.0)
MCH: 31.1 pg (ref 26.0–34.0)
MCHC: 33.9 g/dL (ref 30.0–36.0)
MCV: 92 fL (ref 80.0–100.0)
Monocytes Absolute: 0.3 10*3/uL (ref 0.1–1.0)
Monocytes Relative: 3 %
Neutro Abs: 10.3 10*3/uL — ABNORMAL HIGH (ref 1.7–7.7)
Neutrophils Relative %: 92 %
Platelets: 321 10*3/uL (ref 150–400)
RBC: 3.5 MIL/uL — ABNORMAL LOW (ref 3.87–5.11)
RDW: 13.2 % (ref 11.5–15.5)
WBC: 11.2 10*3/uL — ABNORMAL HIGH (ref 4.0–10.5)
nRBC: 0 % (ref 0.0–0.2)

## 2021-11-10 LAB — PROTIME-INR
INR: 1.4 — ABNORMAL HIGH (ref 0.8–1.2)
Prothrombin Time: 16.9 seconds — ABNORMAL HIGH (ref 11.4–15.2)

## 2021-11-10 MED ORDER — METOPROLOL SUCCINATE ER 100 MG PO TB24: 150.0000 mg | ORAL_TABLET | Freq: Every day | ORAL | 3 refills | Status: DC

## 2021-11-10 MED ORDER — ATORVASTATIN CALCIUM 20 MG PO TABS: 20.0000 mg | ORAL_TABLET | Freq: Every day | ORAL | 3 refills | Status: DC

## 2021-11-10 MED ORDER — APIXABAN 5 MG PO TABS: 5.0000 mg | ORAL_TABLET | Freq: Two times a day (BID) | ORAL | 3 refills | Status: DC

## 2021-11-10 NOTE — Telephone Encounter (Signed)
Called and spoke with patient regarding call about her being in a-fib.  Patient states she was discharged from the hospital yesterday (11/09/21), she had surgery for femoral hernia repair with a small bowel obstruction. She also had a stroke post-op. Patient reports she was experiencing SOB and palpitations last night and this morning and states she was in a-fib. She did not provide any BP or HR readings. She took 150mg  of Toprol XL this morning. She states she is not currently experiencing any a-fib symptoms at time of call.  She does have a history of a-fib, this is not new onset. She was sinus rhythm at discharge from hospital yesterday.  Her coumadin was discontinued and she was placed on Eliquis 5mg  BID at hospital discharge.  Patient states "I don't trust this a-fib." Requested to see Dr. Acie Fredrickson today if possible.  Spoke with Dr. Acie Fredrickson regarding the above and he is agreeable to see patient today.   Patient placed on DOD schedule for 11/10/21 at 02:30PM with Dr. Acie Fredrickson.  Patient verbalized understanding and agrees with plan above.

## 2021-11-10 NOTE — Patient Instructions (Addendum)
Medication Instructions:  Your physician has recommended you make the following change in your medication:  INCREASE: metoprolol succinate (Toprol XL) to 150 mg by mouth daily   DECREASE: atorvastatin (Lipitor) to 20 mg by mouth once daily  REFILLED: Eliquis 5 mg by mouth twice daily  *If you need a refill on your cardiac medications before your next appointment, please call your pharmacy*   Lab Work: IN 3 MONTHS (same day as follow up with Tereso Newcomer): Fasting lipid panel, ALT , BMP  If you have labs (blood work) drawn today and your tests are completely normal, you will receive your results only by: MyChart Message (if you have MyChart) OR A paper copy in the mail If you have any lab test that is abnormal or we need to change your treatment, we will call you to review the results.   Testing/Procedures: NONE   Follow-Up: At [facility], you and your health needs are our priority.  As part of our continuing mission to provide you with exceptional heart care, we have created designated Provider Care Teams.  These Care Teams include your primary Cardiologist (physician) and Advanced Practice Providers (APPs -  Physician Assistants and Nurse Practitioners) who all work together to provide you with the care you need, when you need it.   Your next appointment:   3 month(s)  The format for your next appointment:   In Person  Provider:   Tereso Newcomer, PA

## 2021-11-10 NOTE — Telephone Encounter (Signed)
° °  Patient c/o Palpitations:  High priority if patient c/o lightheadedness, shortness of breath, or chest pain  How long have you had palpitations/irregular HR/ Afib? Are you having the symptoms now? yes  Are you currently experiencing lightheadedness, SOB or CP?   Do you have a history of afib (atrial fibrillation) or irregular heart rhythm? yes  Have you checked your BP or HR? (document readings if available):   Are you experiencing any other symptoms? Pt's daughter calling she said pt was discharged from hospital and a lot of her meds was change like from coumadin to eliquis 5 mg 2x a day. They were told to f/u with Dr. Elease Hashimoto asap and she is very concern because pt is in afib today. They wanted to know if they can come in today to see Dr. Elease Hashimoto

## 2021-11-10 NOTE — Progress Notes (Signed)
**Note De-Identified Downs Obfuscation** Cardiology Office Note:    Date:  11/10/2021   ID:  [name], DOB January 06, 1946, MRN [id number]  PCP:  Hannah Montana, MD  [facility] Cardiologist:  Hannah Downs [facility] Electrophysiologist:  None   Referring MD: Hannah Montana, MD   Chief Complaint  Patient presents with   Atrial Fibrillation    Sept. 7, 2021:   Hannah Downs is a 76 y.o. female with a hx of HTN, HLD, PAF  She had been complaining of palpitations and an event monitor showed atrial fib .  She was originally prescribed Eliquis but is now on Coumadin due to cost   She was originally seen in [place] but would like to switch to see me since they live here and  I see her husband .  Has been having palpitations of the past year.  Has had some dizziness,   Fell on the bathroom floor.  Her HR seemed to be normal after she passed. Out   Was seen in the [place] office.  Orthostatic BP were checked, Zio monitor showed Afib   Has paroxysmal AFib Can last for 30 minutes.  Labs from Dr. Cliffton Downs were reviewed Potassium is 4.4.   Hb is 14. 5   Echo in July show normal LV function. Grade 2 diastolic dysfunction  She is feeling better now that she is on metoprolol   Feb. 4, 2022:  Hannah Downs is seen today for follow up of her PAF She has grade 2 diastolic dysfunction She is feeling well   Feb. 28, 2023 Seen with daughter, Hannah Downs SBO  from an inguinal hernia on Jan. 6.  Was not able to have surgery  A month later developed incarcerated hernia and SBO . INR was 2.6  Received Vit K and synthetic Vit K Wheeled into surgery . There was a lapse in getting her back on warfarin.  Ultimately was placed on Eliquis  but its $500+ Daughter said the family will take care of the cost Will as Hannah Downs to help with patient assistance   Is eating and drinking ok now .   Wt is 124 lbs Age 76 Creatinine 0.5  Past Medical History:  Diagnosis Date   Arthritis    Dysrhythmia    afib    GERD (gastroesophageal reflux disease)    Hypercholesterolemia    Hypertension    Lumbar degenerative disc disease    Miscarriage    times 3   Osteopenia    Seasonal allergies    Varicose vein    right leg   Vitamin D deficiency     Past Surgical History:  Procedure Laterality Date   DILATION AND CURETTAGE OF UTERUS  1998   hx of approx 26 years ago   FEMORAL HERNIA REPAIR  11/02/2006   Emergent reduction/primary repair.  Dr Hannah Downs   INGUINAL HERNIA REPAIR  02/03/2007   open w mesh.  Dr Hannah Downs   INGUINAL HERNIA REPAIR N/A 11/04/2021   Procedure: lysis of adhesions reduction of incarcerated recurrent left femoral hernia laparoscopic repair. laparoscopic right femoral hernia repair and right obturator repair with mesh. tap block;  Surgeon: Hannah Soda, MD;  Location: [facility];  Service: General;  Laterality: N/A;   ROBOTIC ASSISTED DIAGNOSTIC LAPAROSCOPY  09/18/2021   robotic reduction/partial takedown of recurrent inguinal hernia   TOTAL HIP ARTHROPLASTY Right 06/26/2014   Procedure: RIGHT TOTAL HIP ARTHROPLASTY ANTERIOR APPROACH WITH ACETABULAR AUTOGRAFT;  Surgeon: Hannah Drilling, MD;  Location: [facility];  Service: Orthopedics;  Laterality: Right;    Current Medications: Current Meds  Medication Sig   acetaminophen (TYLENOL) 500 MG tablet Take 1,000 mg by mouth every 6 (six) hours as needed for moderate pain.   atorvastatin (LIPITOR) 20 MG tablet Take 1 tablet (20 mg total) by mouth daily.   Cholecalciferol (VITAMIN D3) 50 MCG (2000 UT) TABS Take 2,000 Units by mouth in the morning.   feeding supplement (ENSURE SURGERY) LIQD Take 237 mLs by mouth daily.   gabapentin (NEURONTIN) 100 MG capsule Take 1 capsule (100 mg total) by mouth 2 (two) times daily.   metoprolol succinate (TOPROL-XL) 100 MG 24 hr tablet Take 1.5 tablets (150 mg total) by mouth daily. Take with or immediately following a meal.   Multiple Vitamin (MULTIVITAMIN WITH MINERALS) TABS tablet Take 1 tablet by mouth  daily.   traMADol (ULTRAM) 50 MG tablet Take 1-2 tablets (50-100 mg total) by mouth every 12 (twelve) hours as needed (mild pain).   [DISCONTINUED] apixaban (ELIQUIS) 5 MG TABS tablet Take 1 tablet (5 mg total) by mouth 2 (two) times daily.   [DISCONTINUED] atorvastatin (LIPITOR) 80 MG tablet Take 1 tablet (80 mg total) by mouth daily.   [DISCONTINUED] metoprolol succinate (TOPROL-XL) 100 MG 24 hr tablet Take 1 tablet (100 mg total) by mouth daily. Pt. Need to make appt. With Cardiologist in order to receive further refills. Thank You. 1st Attempt. (Patient taking differently: Take 150 mg by mouth at bedtime.)     Allergies:   Lisinopril   Social History   Socioeconomic History   Marital status: Married    Spouse name: Not on file   Number of children: Not on file   Years of education: Not on file   Highest education level: Not on file  Occupational History   Not on file  Tobacco Use   Smoking status: Never   Smokeless tobacco: Never  Vaping Use   Vaping Use: Never used  Substance and Sexual Activity   Alcohol use: No   Drug use: No   Sexual activity: Yes    Birth control/protection: Patch  Other Topics Concern   Not on file  Social History Narrative   Not on file   Social Determinants of Health   Financial Resource Strain: Not on file  Food Insecurity: Not on file  Transportation Needs: Not on file  Physical Activity: Not on file  Stress: Not on file  Social Connections: Not on file     Family History: The patient's family history includes Atrial fibrillation in her father; Cancer in her mother; Stroke in her father.  ROS:   Please see the history of present illness.     All other systems reviewed and are negative.  EKGs/Labs/Other Studies Reviewed:       EKG: Normal sinus rhythm at 78.  No ST or T wave changes.  Recent Labs: 11/06/2021: ALT 17 11/08/2021: BUN 7; Creatinine, Ser 0.53; Magnesium 1.7; Potassium 4.3; Sodium 133 11/09/2021: Hemoglobin 10.0;  Platelets 251  Recent Lipid Panel    Component Value Date/Time   CHOL 109 11/08/2021 0206   TRIG 55 11/08/2021 0206   HDL 55 11/08/2021 0206   CHOLHDL 2.0 11/08/2021 0206   VLDL 11 11/08/2021 0206   LDLCALC 43 11/08/2021 0206    Physical Exam:     Physical Exam: Blood pressure 140/80, pulse 78, height 5\' 1"  (1.549 m), weight 124 lb (56.2 kg).  GEN:  Well nourished, well developed in no acute distress HEENT: Normal  NECK: No JVD; No carotid bruits LYMPHATICS: No lymphadenopathy CARDIAC: RRR , no murmurs, rubs, gallops RESPIRATORY:  Clear to auscultation without rales, wheezing or rhonchi  ABDOMEN: Soft, non-tender, non-distended MUSCULOSKELETAL:  No edema; No deformity  SKIN: Warm and dry NEUROLOGIC:  Alert and oriented x 3     ASSESSMENT:    1. Atrial fibrillation, unspecified type (HCC)     PLAN:       Paroxysmal atrial fib:   She is maintaining normal sinus rhythm at this point.  She does have occasional episodes of PAF that last for maybe 30 minutes or so.  Continue metoprolol at current dose.  She was previously maintained on warfarin.  She is now been changed to Eliquis.  If she can afford it I think Eliquis would work better for her.  If it becomes difficult for her to afford Eliquis we can transition her back to warfarin.  2.  Inguinal hernia surgery.She has had rather urgent inguinal hernia surgery when she developed an incarcerated hernia.  Her warfarin had to be reversed very quickly and as result she had a perioperative stroke.  She seems to have recovered quite well from the stroke.  She will follow-up with her surgeons for further management of her hernia.  3.  History of embolic stroke: She will be seeing neurology.  Continue anticoagulation for her paroxysmal atrial fibrillation.  .     Medication Adjustments/Labs and Tests Ordered: Current medicines are reviewed at length with the patient today.  Concerns regarding medicines are outlined above.   Orders Placed This Encounter  Procedures   EKG 12-Lead   Meds ordered this encounter  Medications   metoprolol succinate (TOPROL-XL) 100 MG 24 hr tablet    Sig: Take 1.5 tablets (150 mg total) by mouth daily. Take with or immediately following a meal.    Dispense:  135 tablet    Refill:  3    Please cut tablets for pt. Thanks   atorvastatin (LIPITOR) 20 MG tablet    Sig: Take 1 tablet (20 mg total) by mouth daily.    Dispense:  90 tablet    Refill:  3   apixaban (ELIQUIS) 5 MG TABS tablet    Sig: Take 1 tablet (5 mg total) by mouth 2 (two) times daily.    Dispense:  90 tablet    Refill:  3     Patient Instructions  Medication Instructions:  Your physician has recommended you make the following change in your medication:  INCREASE: metoprolol succinate (Toprol XL) to 150 mg by mouth daily   DECREASE: atorvastatin (Lipitor) to 20 mg by mouth once daily  REFILLED: Eliquis 5 mg by mouth twice daily  *If you need a refill on your cardiac medications before your next appointment, please call your pharmacy*   Lab Work: IN 3 MONTHS (same day as follow up with Tereso Newcomer): Fasting lipid panel, ALT , BMP  If you have labs (blood work) drawn today and your tests are completely normal, you will receive your results only by: MyChart Message (if you have MyChart) OR A paper copy in the mail If you have any lab test that is abnormal or we need to change your treatment, we will call you to review the results.   Testing/Procedures: NONE   Follow-Up: At [facility], you and your health needs are our priority.  As part of our continuing mission to provide you with exceptional heart care, we have created designated Provider Care Teams.  These  Care Teams include your primary Cardiologist (physician) and Advanced Practice Providers (APPs -  Physician Assistants and Nurse Practitioners) who all work together to provide you with the care you need, when you need it.   Your next  appointment:   3 month(s)  The format for your next appointment:   In Person  Provider:   Tereso Newcomer, PA      Signed, Kristeen Miss, MD  11/10/2021 5:26 PM    [facility]

## 2021-11-10 NOTE — ED Provider Triage Note (Signed)
Emergency Medicine Provider Triage Evaluation Note  Hannah Downs , a 76 y.o. female  was evaluated in triage.  Pt complains of dizziness that started at 2 PM this afternoon.  Dizziness started as patient was leaving her cardiologist office, but she did not mention this at the visit.  Patient reports that around 2:00 she started feeling dizzy, she has difficulty describing this and reports it felt primarily like she was going to pass out but did not have a syncopal episode.  Patient also reports feeling off balance as if she is swaying to one side.  No associated speech changes, headache, vision changes, numbness or weakness.  Patient was discharged yesterday after admission for emergent surgery for incarcerated hernia, has history of A-fib on anticoagulation and this had to be reversed for surgery and she had a perioperative stroke, she reports at that time she had speech changes and facial droop but no dizziness.  Review of Systems  Positive: Dizziness, lightheadedness, nausea Negative: Fever, chest pain, shortness of breath, abdominal pain, numbness, weakness, speech changes, headache  Physical Exam  BP 136/87    Pulse 69    Temp 97.8 F (36.6 C) (Oral)    Resp 16    SpO2 100%  Gen:   Awake, no distress   Resp:  Normal effort  MSK:   Moves extremities without difficulty  Other:  No focal neurodeficits  Medical Decision Making  Medically screening exam initiated at 10:20 PM.  Appropriate orders placed.  Hannah Downs was informed that the remainder of the evaluation will be completed by another provider, this initial triage assessment does not replace that evaluation, and the importance of remaining in the ED until their evaluation is complete.  Near syncope vs dizziness, possible stroke, , no deficits, outside window for CODE stroke   Jacqlyn Larsen, [place] 11/10/21 2233

## 2021-11-10 NOTE — ED Triage Notes (Signed)
Pt presents to the ED from home via [facility] with complaints of dizziness onset 2pm and N/V onset at 4pm today. Pt was just D/C from here yesterday after emergency hernia repair on 2/22 and then CVA without deficits on 2/24. Denies CP or SOB, feels weak all over and having a hard time getting up out of chair. Saw PMD today and everything checked out.

## 2021-11-11 ENCOUNTER — Other Ambulatory Visit: Payer: Self-pay

## 2021-11-11 ENCOUNTER — Inpatient Hospital Stay (HOSPITAL_COMMUNITY)
Admission: EM | Admit: 2021-11-11 | Discharge: 2021-11-13 | DRG: 065 | Disposition: A | Discharge status: Home Health | Payer: Medicare Other | Attending: Internal Medicine | Admitting: Internal Medicine

## 2021-11-11 ENCOUNTER — Observation Stay (HOSPITAL_COMMUNITY): Payer: Medicare Other

## 2021-11-11 ENCOUNTER — Encounter (HOSPITAL_COMMUNITY): Payer: Self-pay

## 2021-11-11 DIAGNOSIS — K219 Gastro-esophageal reflux disease without esophagitis: Secondary | ICD-10-CM | POA: Diagnosis present

## 2021-11-11 DIAGNOSIS — Z96641 Presence of right artificial hip joint: Secondary | ICD-10-CM | POA: Diagnosis present

## 2021-11-11 DIAGNOSIS — M8580 Other specified disorders of bone density and structure, unspecified site: Secondary | ICD-10-CM | POA: Diagnosis present

## 2021-11-11 DIAGNOSIS — N819 Female genital prolapse, unspecified: Secondary | ICD-10-CM | POA: Diagnosis not present

## 2021-11-11 DIAGNOSIS — Z823 Family history of stroke: Secondary | ICD-10-CM | POA: Diagnosis not present

## 2021-11-11 DIAGNOSIS — I480 Paroxysmal atrial fibrillation: Secondary | ICD-10-CM | POA: Diagnosis present

## 2021-11-11 DIAGNOSIS — E7800 Pure hypercholesterolemia, unspecified: Secondary | ICD-10-CM | POA: Diagnosis present

## 2021-11-11 DIAGNOSIS — I10 Essential (primary) hypertension: Secondary | ICD-10-CM | POA: Diagnosis present

## 2021-11-11 DIAGNOSIS — Z7901 Long term (current) use of anticoagulants: Secondary | ICD-10-CM

## 2021-11-11 DIAGNOSIS — R42 Dizziness and giddiness: Principal | ICD-10-CM

## 2021-11-11 DIAGNOSIS — Z20822 Contact with and (suspected) exposure to covid-19: Secondary | ICD-10-CM | POA: Diagnosis present

## 2021-11-11 DIAGNOSIS — R29700 NIHSS score 0: Secondary | ICD-10-CM | POA: Diagnosis present

## 2021-11-11 DIAGNOSIS — I639 Cerebral infarction, unspecified: Secondary | ICD-10-CM | POA: Diagnosis not present

## 2021-11-11 DIAGNOSIS — Z809 Family history of malignant neoplasm, unspecified: Secondary | ICD-10-CM | POA: Diagnosis not present

## 2021-11-11 DIAGNOSIS — E871 Hypo-osmolality and hyponatremia: Secondary | ICD-10-CM | POA: Diagnosis present

## 2021-11-11 DIAGNOSIS — Z8673 Personal history of transient ischemic attack (TIA), and cerebral infarction without residual deficits: Secondary | ICD-10-CM | POA: Diagnosis not present

## 2021-11-11 DIAGNOSIS — Z79899 Other long term (current) drug therapy: Secondary | ICD-10-CM | POA: Diagnosis not present

## 2021-11-11 DIAGNOSIS — K91872 Postprocedural seroma of a digestive system organ or structure following a digestive system procedure: Secondary | ICD-10-CM | POA: Diagnosis present

## 2021-11-11 DIAGNOSIS — I63441 Cerebral infarction due to embolism of right cerebellar artery: Principal | ICD-10-CM | POA: Diagnosis present

## 2021-11-11 DIAGNOSIS — M1990 Unspecified osteoarthritis, unspecified site: Secondary | ICD-10-CM | POA: Diagnosis present

## 2021-11-11 DIAGNOSIS — K8689 Other specified diseases of pancreas: Secondary | ICD-10-CM | POA: Diagnosis not present

## 2021-11-11 DIAGNOSIS — Z888 Allergy status to other drugs, medicaments and biological substances status: Secondary | ICD-10-CM

## 2021-11-11 DIAGNOSIS — E785 Hyperlipidemia, unspecified: Secondary | ICD-10-CM | POA: Diagnosis not present

## 2021-11-11 DIAGNOSIS — K449 Diaphragmatic hernia without obstruction or gangrene: Secondary | ICD-10-CM | POA: Diagnosis not present

## 2021-11-11 DIAGNOSIS — Y838 Other surgical procedures as the cause of abnormal reaction of the patient, or of later complication, without mention of misadventure at the time of the procedure: Secondary | ICD-10-CM | POA: Diagnosis present

## 2021-11-11 DIAGNOSIS — K4090 Unilateral inguinal hernia, without obstruction or gangrene, not specified as recurrent: Secondary | ICD-10-CM | POA: Diagnosis not present

## 2021-11-11 LAB — BASIC METABOLIC PANEL
Anion gap: 7 (ref 5–15)
BUN: 12 mg/dL (ref 8–23)
CO2: 28 mmol/L (ref 22–32)
Calcium: 8.7 mg/dL — ABNORMAL LOW (ref 8.9–10.3)
Chloride: 93 mmol/L — ABNORMAL LOW (ref 98–111)
Creatinine, Ser: 0.39 mg/dL — ABNORMAL LOW (ref 0.44–1.00)
GFR, Estimated: >60 mL/min (ref >60)
Glucose, Bld: 121 mg/dL — ABNORMAL HIGH (ref 70–99)
Potassium: 4.1 mmol/L (ref 3.5–5.1)
Sodium: 128 mmol/L — ABNORMAL LOW (ref 135–145)

## 2021-11-11 LAB — CBC WITH DIFFERENTIAL/PLATELET
Abs Immature Granulocytes: 0.03 10*3/uL (ref 0.00–0.07)
Basophils Absolute: 0 10*3/uL (ref 0.0–0.1)
Basophils Relative: 0 %
Eosinophils Absolute: 0 10*3/uL (ref 0.0–0.5)
Eosinophils Relative: 0 %
HCT: 31.2 % — ABNORMAL LOW (ref 36.0–46.0)
Hemoglobin: 10.4 g/dL — ABNORMAL LOW (ref 12.0–15.0)
Immature Granulocytes: 0 %
Lymphocytes Relative: 9 %
Lymphs Abs: 0.9 10*3/uL (ref 0.7–4.0)
MCH: 30.7 pg (ref 26.0–34.0)
MCHC: 33.3 g/dL (ref 30.0–36.0)
MCV: 92 fL (ref 80.0–100.0)
Monocytes Absolute: 0.7 10*3/uL (ref 0.1–1.0)
Monocytes Relative: 7 %
Neutro Abs: 8.4 10*3/uL — ABNORMAL HIGH (ref 1.7–7.7)
Neutrophils Relative %: 84 %
Platelets: 312 10*3/uL (ref 150–400)
RBC: 3.39 MIL/uL — ABNORMAL LOW (ref 3.87–5.11)
RDW: 13.4 % (ref 11.5–15.5)
WBC: 10.1 10*3/uL (ref 4.0–10.5)
nRBC: 0 % (ref 0.0–0.2)

## 2021-11-11 LAB — TROPONIN I (HIGH SENSITIVITY)
Troponin I (High Sensitivity): 32 ng/L — ABNORMAL HIGH (ref <18)
Troponin I (High Sensitivity): 34 ng/L — ABNORMAL HIGH (ref <18)

## 2021-11-11 LAB — HEPATIC FUNCTION PANEL
ALT: 52 U/L — ABNORMAL HIGH (ref 0–44)
AST: 59 U/L — ABNORMAL HIGH (ref 15–41)
Albumin: 3.2 g/dL — ABNORMAL LOW (ref 3.5–5.0)
Alkaline Phosphatase: 57 U/L (ref 38–126)
Bilirubin, Direct: 0.4 mg/dL — ABNORMAL HIGH (ref 0.0–0.2)
Indirect Bilirubin: 1.5 mg/dL — ABNORMAL HIGH (ref 0.3–0.9)
Total Bilirubin: 1.9 mg/dL — ABNORMAL HIGH (ref 0.3–1.2)
Total Protein: 5.8 g/dL — ABNORMAL LOW (ref 6.5–8.1)

## 2021-11-11 LAB — RESP PANEL BY RT-PCR (FLU A&B, COVID) ARPGX2
Influenza A by PCR: NEGATIVE
Influenza B by PCR: NEGATIVE
SARS Coronavirus 2 by RT PCR: NEGATIVE

## 2021-11-11 MED ORDER — FENTANYL CITRATE PF 50 MCG/ML IJ SOSY
50.0000 ug | PREFILLED_SYRINGE | Freq: Once | INTRAMUSCULAR | Status: DC
  Filled 2021-11-11: qty 1

## 2021-11-11 MED ORDER — IOHEXOL 300 MG/ML  SOLN
100.0000 mL | Freq: Once | INTRAMUSCULAR | Status: AC | PRN
  Administered 2021-11-11: 100 mL via INTRAVENOUS

## 2021-11-11 MED ORDER — TRAMADOL HCL 50 MG PO TABS: 50.0000 mg | ORAL_TABLET | Freq: Two times a day (BID) | ORAL | Status: DC | PRN

## 2021-11-11 MED ORDER — ATORVASTATIN CALCIUM 10 MG PO TABS
20.0000 mg | ORAL_TABLET | Freq: Every day | ORAL | Status: DC
  Administered 2021-11-11 – 2021-11-13 (×3): 20 mg via ORAL
  Filled 2021-11-11: qty 1
  Filled 2021-11-11 (×2): qty 2

## 2021-11-11 MED ORDER — ONDANSETRON HCL 4 MG/2ML IJ SOLN
4.0000 mg | Freq: Four times a day (QID) | INTRAMUSCULAR | Status: DC | PRN
  Administered 2021-11-11 – 2021-11-12 (×2): 4 mg via INTRAVENOUS
  Filled 2021-11-11 (×2): qty 2

## 2021-11-11 MED ORDER — SODIUM CHLORIDE (PF) 0.9 % IJ SOLN
INTRAMUSCULAR | Status: AC
  Filled 2021-11-11: qty 50

## 2021-11-11 MED ORDER — ONDANSETRON HCL 4 MG/2ML IJ SOLN
4.0000 mg | Freq: Once | INTRAMUSCULAR | Status: AC
  Administered 2021-11-11: 4 mg via INTRAVENOUS
  Filled 2021-11-11: qty 2

## 2021-11-11 MED ORDER — ACETAMINOPHEN 500 MG PO TABS
500.0000 mg | ORAL_TABLET | ORAL | Status: DC | PRN
  Administered 2021-11-11: 500 mg via ORAL
  Filled 2021-11-11: qty 1

## 2021-11-11 MED ORDER — ASPIRIN EC 81 MG PO TBEC
81.0000 mg | DELAYED_RELEASE_TABLET | Freq: Every day | ORAL | Status: DC
  Administered 2021-11-11 – 2021-11-13 (×3): 81 mg via ORAL
  Filled 2021-11-11 (×3): qty 1

## 2021-11-11 MED ORDER — CHLORHEXIDINE GLUCONATE CLOTH 2 % EX PADS
6.0000 | MEDICATED_PAD | Freq: Every day | CUTANEOUS | Status: DC
  Administered 2021-11-11 – 2021-11-13 (×3): 6 via TOPICAL

## 2021-11-11 MED ORDER — METOPROLOL SUCCINATE ER 50 MG PO TB24
150.0000 mg | ORAL_TABLET | Freq: Every day | ORAL | Status: DC
  Administered 2021-11-11 – 2021-11-13 (×3): 150 mg via ORAL
  Filled 2021-11-11 (×3): qty 1

## 2021-11-11 MED ORDER — GABAPENTIN 100 MG PO CAPS
100.0000 mg | ORAL_CAPSULE | Freq: Two times a day (BID) | ORAL | Status: DC
  Filled 2021-11-11 (×2): qty 1

## 2021-11-11 MED ORDER — LACTATED RINGERS IV BOLUS
1000.0000 mL | Freq: Once | INTRAVENOUS | Status: AC
  Administered 2021-11-11: 1000 mL via INTRAVENOUS

## 2021-11-11 NOTE — Evaluation (Signed)
Physical Therapy Evaluation ?Patient Details ?Name: Hannah Downs ?MRN: [id number] ?DOB: [birth year] ?Today's Date: 11/11/2021 ? ?History of Present Illness ? Pt. is a 76 y.o. female presenting to [facility] on 3/1 with complaints of dizziness. She was recently released Monday (2/27) after being admitted for an incarcerated hernia and subsequently having a small left frontoparietal infart without deficits. 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  PMH: afib, arthritis, GERD, h/o hernias with previous surgeries  ?Clinical Impression ? Pt presents with dizziness/nausea. These impairments are limiting her ability to safely and independently transfer, get into her home, perform all adls/iadls, and ambulate in the community. Pt to benefit from acute PT to address deficits. Bed mobility and function sit<>stand transfer performed. Pt. Responded well and was close guard level for safety but was limited secondary to dizziness/nausea symptoms. SPT currently recommends AIR given deficits pending further medical workup. PT to progress mobility as tolerated, and will continue to follow acutely.  ?   ?   ? ?Recommendations for follow up therapy are one component of a multi-disciplinary discharge planning process, led by the attending physician.  Recommendations may be updated based on patient status, additional functional criteria and insurance authorization. ? ?Follow Up Recommendations Acute inpatient rehab (3hours/day) ? ?  ?Assistance Recommended at Discharge Frequent or constant Supervision/Assistance  ?Patient can return home with the following ? A little help with walking and/or transfers;A little help with bathing/dressing/bathroom;Assist for transportation ? ?  ?Equipment Recommendations None recommended by PT  ?Recommendations for Other Services ? OT consult  ?  ?Functional Status Assessment Patient has had a recent decline in their functional status and demonstrates the ability to make significant  improvements in function in a reasonable and predictable amount of time.  ? ?  ?Precautions / Restrictions Precautions ?Precautions: Other (comment) ?Precaution Comments: recently abdominal surgery ?Restrictions ?Weight Bearing Restrictions: No  ? ?  ? ?Mobility ? Bed Mobility ?Overal bed mobility: Needs Assistance ?Bed Mobility: Supine to Sit, Sit to Supine ?  ?  ?  ?Sit to supine: Min guard ?Sit to sidelying: Min guard ?General bed mobility comments: Min guard for saftey secondary to dizziness/nausea ?  ? ?Transfers ?Overall transfer level: Needs assistance ?Equipment used: Rolling walker (2 wheels) ?Transfers: Sit to/from Stand ?Sit to Stand: Min guard ?  ?  ?  ?  ?  ?General transfer comment: Min guard for saftey secondary to dizziness/nausea, pt. able to self steady. BP sitting (129/88) BP standing (128/78), no reported increase in lightheadedness/dizziness ?  ? ?Ambulation/Gait ?  ?  ?  ?  ?  ?  ?  ?  ? ?Stairs ?  ?  ?  ?  ?  ? ?Wheelchair Mobility ?  ? ?Modified Rankin (Stroke Patients Only) ?Modified Rankin (Stroke Patients Only) ?Pre-Morbid Rankin Score: Slight disability ?Modified Rankin: Moderately severe disability ? ?  ? ?Balance Overall balance assessment: Needs assistance ?Sitting-balance support: No upper extremity supported ?Sitting balance-Leahy Scale: Fair ?Sitting balance - Comments: Pt. able to sit EOB for 3 minutes, complaints of dizziness and nausea throughout ?  ?Standing balance support: Bilateral upper extremity supported ?Standing balance-Leahy Scale: Poor ?Standing balance comment: Pt. able to stand min guard for 1 minute for blood pressure, requests to sit down ?  ?  ?  ?  ?  ?  ?  ?  ?  ?  ?  ?   ? ? ? ?Pertinent Vitals/Pain Pain Assessment ?Pain Assessment: No/denies pain  ? ? ?Home Living Family/patient  expects to be discharged to:: Private residence ?Living Arrangements: Spouse/significant other;Children (1 daughter and grandchild) ?Available Help at Discharge: Family;Available 24  hours/day ?Type of Home: House ?Home Access: Stairs to enter ?Entrance Stairs-Rails: Right ?Entrance Stairs-Number of Steps: 4 ?  ?Home Layout: One level ?Home Equipment: Educational psychologist (4 wheels) ?   ?  ?Prior Function Prior Level of Function : Independent/Modified Independent ?  ?  ?  ?  ?  ?  ?Mobility Comments: indep, no AD, doesn't drive ?ADLs Comments: indep ?  ? ? ?Hand Dominance  ? Dominant Hand: Left ? ?  ?Extremity/Trunk Assessment  ? Upper Extremity Assessment ?Upper Extremity Assessment: Overall WFL for tasks assessed ?  ? ?Lower Extremity Assessment ?Lower Extremity Assessment: Overall WFL for tasks assessed ?  ? ?Cervical / Trunk Assessment ?Cervical / Trunk Assessment: Other exceptions ?Cervical / Trunk Exceptions: abdominal surgery on 2/23  ?Communication  ? Communication: No difficulties  ?Cognition Arousal/Alertness: Awake/alert ?Behavior During Therapy: [facility] for tasks assessed/performed ?Overall Cognitive Status: Impaired/Different from baseline ?Area of Impairment: Following commands ?  ?  ?  ?  ?  ?  ?  ?  ?  ?  ?  ?Following Commands: Follows multi-step commands inconsistently ?  ?  ?  ?General Comments: Dealyed Processing ?  ?  ? ?  ?General Comments   ? ?  ?Exercises General Exercises - Lower Extremity ?Ankle Circles/Pumps: AROM, Both, 10 reps ?Quad Sets: AROM, Both, 10 reps ?Heel Slides: AROM, Both, 10 reps ?Straight Leg Raises: AROM, Both, 10 reps  ? ?Assessment/Plan  ?  ?PT Assessment Patient needs continued PT services  ?PT Problem List Decreased strength;Decreased mobility;Decreased knowledge of use of DME;Decreased activity tolerance;Decreased coordination;Decreased balance ? ?   ?  ?PT Treatment Interventions DME instruction;Gait training;Stair training;Functional mobility training;Therapeutic activities;Therapeutic exercise;Balance training   ? ?PT Goals (Current goals can be found in the Care Plan section)  ?Acute Rehab PT Goals ?Patient Stated Goal: To improve dizziness and  positional tolerance ?PT Goal Formulation: With patient/family ?Time For Goal Achievement: 11/25/21 ?Potential to Achieve Goals: Good ? ?  ?Frequency Min 4X/week ?  ? ? ?Co-evaluation   ?  ?  ?  ?  ? ? ?  ?AM-PAC PT "6 Clicks" Mobility  ?Outcome Measure Help needed turning from your back to your side while in a flat bed without using bedrails?: A Little ?Help needed moving from lying on your back to sitting on the side of a flat bed without using bedrails?: A Little ?Help needed moving to and from a bed to a chair (including a wheelchair)?: A Little ?Help needed standing up from a chair using your arms (e.g., wheelchair or bedside chair)?: A Lot ?Help needed to walk in hospital room?: A Lot ?Help needed climbing 3-5 steps with a railing? : A Lot ?6 Click Score: 15 ? ?  ?End of Session   ?Activity Tolerance: Other (comment) (Pt. limited by dizziness/nausea) ?Patient left: in bed;with call bell/phone within reach;with bed alarm set;with family/visitor present ?Nurse Communication: Mobility status ?PT Visit Diagnosis: Dizziness and giddiness (R42);Unsteadiness on feet (R26.81);Other symptoms and signs involving the nervous system (R29.898) ?  ? ?Time:  -  ?  ? ? ?Charges:     ?  ?  ?   ? ? ?Lorie Apley, SPT ?Acute Rehab Services ? ? ?Lorie Apley ?11/11/2021, 12:41 PM ? ?

## 2021-11-11 NOTE — Consult Note (Addendum)
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 Verlon Au  CC: Dizziness  History is obtained from: Patient, Patient's daughter at bedside  HPI: Hannah Downs is a 77 y.o. female with a medical history significant for paroxysmal atrial fibrillation previously on Warfarin with recent transition to Eliquis, hyperlipidemia, essential hypertension, recent discharge from the hospital on 2/27 following a left femoral hernia incarcerated with small bowel causing obstruction s/p laparoscopic repair on 2/23. Prior to her hernia repair on 2/23 the patient's warfarin was reversed with vitamin K and Kcentra with development of right sided facial and right arm numbness on 2/24 with MRI evidence of left frontoparietal infarction. Patient was discharged on Eliquis with he first dose on 11/09/21 at 1300 and has remained compliant with this mediation regimen outpatient. On 2/28, patient woke up in her usual state of health at 06:30 and had a cup of coffee. She states that at around 08:30, she had an acute onset of dizziness but attributed this to her atrial fibrillation and states that it resolved briefly thereafter. She states that she was just feeling generally "off" and decided to go to the atrial fibrillation clinic for evaluation. While she was at the clinic at around 14:00, the patient states that dizziness "hit her like a ton of bricks" with associated nausea, vomiting, and imbalance.   At baseline, patient lives with her daughter but is able to function independently. She is ambulatory with a walker and denies residual symptoms from her previous stroke.   LKW: 2/28 @ 08:00 TNK given?: no, patient on full dose anticoagulation with Eliqius.  IR Thrombectomy? No, presentation is not consistent with an LVO Modified Rankin Scale: 3-Moderate disability-requires help but walks WITHOUT assistance  ROS: A complete ROS was performed and is negative except as  noted in the HPI.   Past Medical History:  Diagnosis Date   Arthritis    Dysrhythmia    afib   GERD (gastroesophageal reflux disease)    Hypercholesterolemia    Hypertension    Lumbar degenerative disc disease    Miscarriage    times 3   Osteopenia    Seasonal allergies    Stroke (HCC)    Varicose vein    right leg   Vitamin D deficiency    Past Surgical History:  Procedure Laterality Date   DILATION AND CURETTAGE OF UTERUS  1998   hx of approx 26 years ago   FEMORAL HERNIA REPAIR  11/02/2006   Emergent reduction/primary repair.  Dr Effie Shy   INGUINAL HERNIA REPAIR  02/03/2007   open w mesh.  Dr Effie Shy   INGUINAL HERNIA REPAIR N/A 11/04/2021   Procedure: lysis of adhesions reduction of incarcerated recurrent left femoral hernia laparoscopic repair. laparoscopic right femoral hernia repair and right obturator repair with mesh. tap block;  Surgeon: Michael Boston, MD;  Location: [facility];  Service: General;  Laterality: N/A;   ROBOTIC ASSISTED DIAGNOSTIC LAPAROSCOPY  09/18/2021   robotic reduction/partial takedown of recurrent inguinal hernia   TOTAL HIP ARTHROPLASTY Right 06/26/2014   Procedure: RIGHT TOTAL HIP ARTHROPLASTY ANTERIOR APPROACH WITH ACETABULAR AUTOGRAFT;  Surgeon: Gearlean Alf, MD;  Location: [facility];  Service: Orthopedics;  Laterality: Right;   Family History  Problem Relation Age of Onset   Cancer Mother    Atrial fibrillation Father    Stroke Father    Social History:   reports that she has never smoked. She has never used smokeless tobacco. She reports that  she does not drink alcohol and does not use drugs.  Medications  Current Facility-Administered Medications:    atorvastatin (LIPITOR) tablet 20 mg, 20 mg, Oral, Daily, Rise Patience, MD, 20 mg at 11/11/21 0945   gabapentin (NEURONTIN) capsule 100 mg, 100 mg, Oral, BID, Rise Patience, MD   metoprolol succinate (TOPROL-XL) 24 hr tablet 150 mg, 150 mg, Oral, Daily, Rise Patience,  MD, 150 mg at 11/11/21 0945   ondansetron (ZOFRAN) injection 4 mg, 4 mg, Intravenous, Q6H PRN, Nita Sells, MD, 4 mg at 11/11/21 0802   traMADol (ULTRAM) tablet 50-100 mg, 50-100 mg, Oral, Q12H PRN, Rise Patience, MD  Exam: Current vital signs: BP 120/80 (BP Location: Right Arm)    Pulse 68    Temp 98.2 F (36.8 C) (Oral)    Resp 14    Ht 5\' 1"  (1.549 m)    Wt 56.2 kg    SpO2 98%    BMI 23.43 kg/m  Vital signs in last 24 hours: Temp:  [97.8 F (36.6 C)-98.4 F (36.9 C)] 98.2 F (36.8 C) (03/01 1217) Pulse Rate:  [68-82] 68 (03/01 1217) Resp:  [14-16] 14 (03/01 1217) BP: (113-144)/(70-87) 120/80 (03/01 1217) SpO2:  [97 %-100 %] 98 % (03/01 1217) Weight:  [56.2 kg] 56.2 kg (03/01 0927)  GENERAL: Awake, alert, in no acute distress Psych: Affect appropriate for situation, patient is calm and cooperative with examination Head: Normocephalic and atraumatic, without obvious abnormality EENT: Normal conjunctivae, dry mucous membranes, no OP obstruction LUNGS: Normal respiratory effort. Non-labored breathing on room air CV: Regular rate and rhythm on telemetry ABDOMEN: Soft, non-tender, non-distended Extremities: Warm, well perfused, without obvious deformity  NEURO:  Mental Status: Awake, alert, and oriented to person, place, time, and situation. She is able to provide a clear and coherent history of present illness. Speech/Language: speech is intact without dysarthria  Naming, repetition, fluency, and comprehension intact without aphasia. No neglect is noted. Cranial Nerves:  II: PERRL 3 mm/brisk. Visual fields full.  III, IV, VI: EOMI without ptosis, nystagmus, or gaze preference V: Sensation is intact to light touch and symmetrical to face.  VII: Face is symmetric resting and smiling. VIII: Hearing is intact to voice IX, X: Palate elevation is symmetric. Phonation normal.  XI: Normal sternocleidomastoid and trapezius muscle strength XII: Tongue protrudes midline  without fasciculations.   Motor: 5/5 strength is all muscle groups without vertical drift. Patient does have some subjective feelings of generalized fatigue and weakness but without noted weakness  on examination.  Tone is normal. Bulk is normal.  Sensation: Intact to light touch bilaterally in all four extremities. No extinction to DSS present.  Coordination: FTN intact bilaterally. HKS intact bilaterally. No pronator drift.  Gait: Deferred  NIHSS: 0  Labs I have reviewed labs in epic and the results pertinent to this consultation are: CBC    Component Value Date/Time   WBC 10.1 11/11/2021 0620   RBC 3.39 (L) 11/11/2021 0620   HGB 10.4 (L) 11/11/2021 0620   HGB 13.8 08/03/2021 1247   HCT 31.2 (L) 11/11/2021 0620   HCT 39.4 08/03/2021 1247   PLT 312 11/11/2021 0620   PLT 301 08/03/2021 1247   MCV 92.0 11/11/2021 0620   MCV 88 08/03/2021 1247   MCH 30.7 11/11/2021 0620   MCHC 33.3 11/11/2021 0620   RDW 13.4 11/11/2021 0620   RDW 12.5 08/03/2021 1247   LYMPHSABS 0.9 11/11/2021 0620   MONOABS 0.7 11/11/2021 0620   EOSABS 0.0 11/11/2021  0620   BASOSABS 0.0 11/11/2021 0620   CMP    Component Value Date/Time   NA 128 (L) 11/11/2021 0620   NA 137 08/03/2021 1247   K 4.1 11/11/2021 0620   CL 93 (L) 11/11/2021 0620   CO2 28 11/11/2021 0620   GLUCOSE 121 (H) 11/11/2021 0620   BUN 12 11/11/2021 0620   BUN 14 08/03/2021 1247   CREATININE 0.39 (L) 11/11/2021 0620   CALCIUM 8.7 (L) 11/11/2021 0620   PROT 5.8 (L) 11/11/2021 0620   ALBUMIN 3.2 (L) 11/11/2021 0620   AST 59 (H) 11/11/2021 0620   ALT 52 (H) 11/11/2021 0620   ALKPHOS 57 11/11/2021 0620   BILITOT 1.9 (H) 11/11/2021 0620   GFRNONAA >60 11/11/2021 0620   GFRAA >90 06/27/2014 0425   Lipid Panel     Component Value Date/Time   CHOL 109 11/08/2021 0206   TRIG 55 11/08/2021 0206   HDL 55 11/08/2021 0206   CHOLHDL 2.0 11/08/2021 0206   VLDL 11 11/08/2021 0206   LDLCALC 43 11/08/2021 0206   Lab Results  Component  Value Date   HGBA1C 5.4 11/08/2021   Imaging I have reviewed the images obtained:  CT-scan of the brain 2/28: No acute intracranial process.  MRI examination of the brain 3/1: 1. Positive for Acute Right Cerebellar PICA territory infarct. No associated hemorrhage or mass effect. 2. 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 No malignant hemorrhagic transformation or mass effect.  Assessment: 76 y.o. female who presented to [facility] on 3/1 with one day of acute onset of dizziness with associated nausea and vomiting. Patient with recent CVA on 2/24 in the setting of missed/reversed anticoagulation for emergency surgery discharged on 2/27 on Eliquis. MRI brain on arrival revealed an acute right cerebellar PICA territory infarction.  - Examination reveals patient without focal neurologic deficit and an NIHSS of 0. Patient does endorse subjective dizziness but there is no overt ataxia on exam.  - MRI brain as above.  - Will obtain further work up for further evaluation.   Recommendations: - Frequent neuro checks - transfer to [facility] for close monitoring of Neuro status given the size and proximity of the  - Prophylactic therapy- Hold anticoagulation at this time due to concern for hemorrhagic conversion. Will do Aspirin 81mg  daily. - Risk factor modification - Telemetry monitoring - PT consult, OT consult, Speech consult - Stroke team to follow  Pt seen by NP/Neuro and later by MD. Note/plan to be edited by MD as needed.  Anibal Henderson, AGAC-NP [facility] Pager: [phone]  NEUROHOSPITALIST ADDENDUM Performed a face to face diagnostic evaluation.   I have reviewed the contents of history and physical exam as documented by PA/ARNP/Resident and agree with above documentation.  I have discussed and formulated the above plan as documented. Edits to the note have been made as needed.  Impression/Key exam findings/Plan: She  has been on warfarin for over a year for Afibb. Was recently admitted for bowel incarceration and warfarin was reversed with Vit K. She was switched to eliquis from warfarin at discharge on Monday. Took 3 doses of eliquis and has been compliant. Despite this, she had a R cerebellar stroke that is moderately large in size. I would consider this to be failure of eliquis.   However, at this time, would hold off on Anticoagulation given acute stroke. She would benefit from being transferred to [facility] given the size of her infarct and proximity to the  brainstem. I think that there is a good chance that the stroke does not result in significant mass effect and crowding of the posterior fossa but that certainly is possible and I think her being at a place where we are available 24/7, would be helpful in addressing any issues that arise.  Would do aspirin 81mg  daily for now and DVT prophylaxis. I do not feel inclined to get full stroke workup since this was just completed last week. We will have stroke team evaluate her when she comes to [facility] and will defer timing of resuming AC to the stroke team.  Low threshold to obtain STAT CTH if she is complaining of headache, has worsening mentation, pupillary abnormalities or worsening NIHSS by more than 3.  I spoke with patient and her daughter at bedside in detail about the plan. I also discussed with Dr. Verlon Au.  Donnetta Simpers, MD [facility] [phone]   If 7pm to 7am, please call on call as listed on AMION.

## 2021-11-11 NOTE — ED Provider Notes (Signed)
?[facility] ?[facility] Emergency Department ?Provider Note ?MRN:  [id number]  ?Arrival date & time: 11/11/21    ? ?Chief Complaint   ?Emesis and Weakness ?  ?History of Present Illness   ?Hannah Downs is a 76 y.o. year-old female presents to the ED with chief complaint of dizziness.  States that she was just released from the hospital on Monday afternoon after having been admitted for incarcerated hernia and subsequently had a stroke while in the hospital.  She states that yesterday while at her A-fib clinic, she became acutely dizzy and has been dizzy ever since.  She reports associated nausea and vomiting and has not been able to keep anything down.  She states that her legs feel weak and that she feels off balance.  She was seen in triage tonight at [facility], but left after long wait time and came to [place].  She continues to have persistent symptoms. ? ?History provided by patient. ?Additional independent history provided by family member, who states, patient recently switched from warfarin to Eliquis, and had her blood pressure medications discontinued while in the hospital. ? ?Review of Systems  ?Pertinent review of systems noted in HPI.  ? ? ?Physical Exam  ? ?Vitals:  ? 11/11/21 0215  ?BP: (!) 144/82  ?Pulse: 82  ?Resp: 14  ?Temp: 98.2 ?F (36.8 ?C)  ?SpO2: 98%  ?  ?CONSTITUTIONAL:  well-appearing, NAD ?NEURO:  Alert and oriented x 3, CN 3-12 grossly intact, normal finger to nose, normal heel to shin ?EYES:  eyes equal and reactive ?ENT/NECK:  Supple, no stridor  ?CARDIO:  normal rate, regular rhythm, appears well-perfused  ?PULM:  No respiratory distress, CTAB ?GI/GU:  non-distended  ?MSK/SPINE:  No gross deformities, no edema, moves all extremities ?SKIN:  no rash, atraumatic ? ? ?*Additional and/or pertinent findings included in MDM below ? ?Diagnostic and Interventional Summary  ? ? EKG Interpretation ? ?Date/Time:    ?Ventricular Rate:    ?PR Interval:    ?QRS Duration:   ?QT  Interval:    ?QTC Calculation:   ?R Axis:     ?Text Interpretation:   ?  ? ?  ? ?Labs Reviewed  ?TROPONIN I (HIGH SENSITIVITY) - Abnormal; Notable for the following components:  ?    Result Value  ? Troponin I (High Sensitivity) 32 (*)   ? All other components within normal limits  ?RESP PANEL BY RT-PCR (FLU A&B, COVID) [id number]  ?TROPONIN I (HIGH SENSITIVITY)  ?  ?CT ABDOMEN PELVIS W CONTRAST  ?Final Result  ?  ?MR BRAIN WO CONTRAST    (Results Pending)  ?  ?Medications  ?fentaNYL (SUBLIMAZE) injection 50 mcg (50 mcg Intravenous Not Given 11/11/21 0307)  ?lactated ringers bolus 1,000 mL (1,000 mLs Intravenous New Bag/Given 11/11/21 0247)  ?ondansetron [facility]) injection 4 mg (4 mg Intravenous Given 11/11/21 0307)  ?iohexol (OMNIPAQUE) 300 MG/ML solution 100 mL (100 mLs Intravenous Contrast Given 11/11/21 0318)  ?sodium chloride (PF) 0.9 % injection (  Given by Other 11/11/21 0349)  ?  ? ?Procedures  /  Critical Care ?Procedures ? ?ED Course and Medical Decision Making  ?I have reviewed the triage vital signs, the nursing notes, and pertinent available records from the EMR. ? ?Complexity of Problems Addressed: ?High Complexity: Acute illness/injury posing a threat to life or bodily function, requiring emergent diagnostic workup, evaluation, and treatment as below. ? ?ED Course: ?After considering the following differential, obstruction, stroke, dehydration, I ordered troponins and CT to workup completed at  [facility]. ?I personally interpreted the labs which are notable for mildly elevated trop, trend during admission.  Denies CP.  No ischemic EKG changes. ?I visualized the CT, which is notable for no obstruction and agree with radiologist interpretation.. ? ?  ? ?Social Determinants Affecting Care: ? ? ? ? ?Consultants: ?I discussed the case with Hospitalist, Dr. Hal Hope, who is appreciated for admitting. ? ?Treatment and Plan: ?Admit for further workup of dizziness. ? ?Patient's exam and diagnostic results are concerning for  possible stroke.  Feel that patient will need admission to the hospital for further treatment and evaluation. ? ?Patient seen by and discussed with attending physician, Dr. Betsey Holiday, who agrees with plan for admission. ? ?Final Clinical Impressions(s) / ED Diagnoses  ? ?  ICD-10-CM   ?1. Dizziness  R42   ?  ?  ?ED Discharge Orders   ? ? None  ? ?  ?  ? ? ?Discharge Instructions Discussed with and Provided to Patient:  ? ?Discharge Instructions   ?None ?  ? ?  ?Montine Circle, PA-C ?11/11/21 [id number] ? ?  ?Orpah Greek, MD ?11/12/21 0402 ? ?

## 2021-11-11 NOTE — TOC Initial Note (Signed)
Transition of Care (TOC) - Initial/Assessment Note  ? ? ?Patient Details  ?Name: Hannah Downs ?MRN: [id number] ?Date of Birth: 09/02/1946 ? ?Transition of Care [facility]) CM/SW Contact:    ?Lanier Clam, RN ?Phone Number: ?11/11/2021, 12:50 PM ? ?Clinical Narrative: CIR to eval await outcome.                ? ? ?Expected Discharge Plan: Home/Self Care ?Barriers to Discharge: Continued Medical Work up ? ? ?Patient Goals and CMS Choice ?Patient states their goals for this hospitalization and ongoing recovery are:: CIR ?CMS Medicare.gov Compare Post Acute Care list provided to:: Patient ?Choice offered to / list presented to : Patient ? ?Expected Discharge Plan and Services ?Expected Discharge Plan: Home/Self Care ?  ?Discharge Planning Services: CM Consult ?  ?  ?                ?  ?  ?  ?  ?  ?  ?  ?  ?  ?  ? ?Prior Living Arrangements/Services ?  ?Lives with:: Spouse ?Patient language and need for interpreter reviewed:: Yes ?Do you feel safe going back to the place where you live?: Yes      ?Need for Family Participation in Patient Care: Yes (Comment) ?Care giver support system in place?: Yes (comment) ?  ?Criminal Activity/Legal Involvement Pertinent to Current Situation/Hospitalization: No - Comment as needed ? ?Activities of Daily Living ?Home Assistive Devices/Equipment: Jeannette How (specify type), Shower chair with back, Raised toilet seat with rails ?ADL Screening (condition at time of admission) ?Patient's cognitive ability adequate to safely complete daily activities?: Yes ?Is the patient deaf or have difficulty hearing?: No ?Does the patient have difficulty seeing, even when wearing glasses/contacts?: No ?Does the patient have difficulty concentrating, remembering, or making decisions?: No ?Patient able to express need for assistance with ADLs?: Yes ?Does the patient have difficulty dressing or bathing?: No ?Independently performs ADLs?: Yes (appropriate for developmental age) ?Does the patient have  difficulty walking or climbing stairs?: No ?Weakness of Legs: Both ?Weakness of Arms/Hands: Both ? ?Permission Sought/Granted ?Permission sought to share information with : Case Manager ?Permission granted to share information with : Yes, Verbal Permission Granted ? Share Information with NAME: Case manager ?   ?   ?   ? ?Emotional Assessment ?  ?  ?  ?  ?  ?  ? ?Admission diagnosis:  Dizziness [R42] ?Patient Active Problem List  ? Diagnosis Date Noted  ? Dizziness 11/11/2021  ? Stroke [facility]) 11/07/2021  ? Asymptomatic bacteriuria 11/06/2021  ? High bilirubin 11/06/2021  ? COVID-19 virus detected 11/05/2021  ? Hyponatremia 11/05/2021  ? Incarcerated femoral hernia 11/05/2021  ? 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 Femoral hernia of left side 11/05/2021  ? Hypomagnesemia 11/05/2021  ? Leukocytosis 11/05/2021  ? SBO (small bowel obstruction) (HCC) 11/04/2021  ? Chronic anticoagulation 11/04/2021  ? Lumbar degenerative disc disease 11/04/2021  ? Seasonal allergies 11/04/2021  ? Adjustment disorder 10/30/2021  ? Acquired thrombophilia (HCC) 10/30/2021  ? Aortic atherosclerosis (HCC) 08/04/2021  ? Essential hypertension 08/04/2021  ? Hyperlipidemia 08/04/2021  ? Gastroesophageal reflux disease without esophagitis 08/04/2021  ? Vitamin D deficiency 08/04/2021  ? Paroxysmal atrial fibrillation with RVR (HCC) 04/02/2020  ? OA (osteoarthritis) of hip 06/26/2014  ? ?PCP:  [name], MD ?Pharmacy:   ?[id number] - [place] - [address] ?[address] ?[place] ([place]  [address] ?Phone: [phone] Fax: [phone] ? ? ? ? ?Social Determinants of Health (SDOH) Interventions ?  ? ?Readmission Risk Interventions ?No flowsheet data found. ? ? ?

## 2021-11-11 NOTE — ED Triage Notes (Addendum)
Pt complains of dizziness and weakness, onset 2pm yesterday and N/V onset at 4pm today. Pt was just D/C from [facility] yesterday after an emergency hernia repair on 2/22 and then CVA without deficits on 2/24. Denies CP or SOB, feels weak all over and having a hard time getting up out of chair and walking.  ?

## 2021-11-11 NOTE — H&P (Signed)
History and Physical    YOSHIGEI FRIEDERICHS VWU:[id number] DOB: 11-05-1945 DOA: 11/11/2021  PCP: [name], MD  Patient coming from: Home.  Chief Complaint: Dizziness.  HPI: Hannah Downs is a 76 y.o. female with history of A-fib, hyperlipidemia who was recently admitted for incarcerated left inguinal hernia during which patient also was in A-fib with RVR and had a stroke was eventually placed on Eliquis discharged 2 days ago started experiencing dizziness after patient visited cardiology office yesterday around 3 PM.  Patient states that days ago she did have an episode of A-fib with RVR for which patient did take an extra dose of Toprol.  Patient states that after leaving the cardiology office yesterday around 3 PM patient started feeling dizzy nauseated and had thrown up multiple times.  Denies any chest pain or shortness of breath.  Denies any weakness of the extremities.  Denies any visual symptoms.  ED Course: Patient had initially gone to [facility] but due to wait time patient came to [facility], ER.  CT head is unremarkable.  Labs show hemoglobin of 10.9 WBC of 11.2.  Sodium 131 LFTs are mildly elevated from baseline.  CT abdomen pelvis done shows possibility of wound dehiscence the left pelvic area.  Surgical consult is recommended.  High sensitive troponin was 32.  EKG shows normal sinus rhythm.  On exam patient is able to move all extremities 5 x 5.  But feels dizzy on trying to move.  COVID test negative.  Review of Systems: As per HPI, rest all negative.   Past Medical History:  Diagnosis Date   Arthritis    Dysrhythmia    afib   GERD (gastroesophageal reflux disease)    Hypercholesterolemia    Hypertension    Lumbar degenerative disc disease    Miscarriage    times 3   Osteopenia    Seasonal allergies    Stroke (HCC)    Varicose vein    right leg   Vitamin D deficiency     Past Surgical History:  Procedure Laterality Date   DILATION AND  CURETTAGE OF UTERUS  1998   hx of approx 26 years ago   FEMORAL HERNIA REPAIR  11/02/2006   Emergent reduction/primary repair.  Dr Grier Mitts   INGUINAL HERNIA REPAIR  02/03/2007   open w mesh.  Dr Grier Mitts   INGUINAL HERNIA REPAIR N/A 11/04/2021   Procedure: lysis of adhesions reduction of incarcerated recurrent left femoral hernia laparoscopic repair. laparoscopic right femoral hernia repair and right obturator repair with mesh. tap block;  Surgeon: Karie Soda, MD;  Location: [facility] ORS;  Service: General;  Laterality: N/A;   ROBOTIC ASSISTED DIAGNOSTIC LAPAROSCOPY  09/18/2021   robotic reduction/partial takedown of recurrent inguinal hernia   TOTAL HIP ARTHROPLASTY Right 06/26/2014   Procedure: RIGHT TOTAL HIP ARTHROPLASTY ANTERIOR APPROACH WITH ACETABULAR AUTOGRAFT;  Surgeon: Loanne Drilling, MD;  Location: [facility] ORS;  Service: Orthopedics;  Laterality: Right;     reports that she has never smoked. She has never used smokeless tobacco. She reports that she does not drink alcohol and does not use drugs.  Allergies  Allergen Reactions   Lisinopril Cough    Family History  Problem Relation Age of Onset   Cancer Mother    Atrial fibrillation Father    Stroke Father     Prior to Admission medications   Medication Sig Start Date End Date Taking? Authorizing Provider  acetaminophen (TYLENOL) 500 MG tablet Take 1,000 mg by  mouth every 6 (six) hours as needed for moderate pain.    [provider]  apixaban (ELIQUIS) 5 MG TABS tablet Take 1 tablet (5 mg total) by mouth 2 (two) times daily. 11/10/21   Nahser, Deloris Ping, MD  atorvastatin (LIPITOR) 20 MG tablet Take 1 tablet (20 mg total) by mouth daily. 11/10/21   Nahser, Deloris Ping, MD  Cholecalciferol (VITAMIN D3) 50 MCG (2000 UT) TABS Take 2,000 Units by mouth in the morning.    [provider]  feeding supplement (ENSURE SURGERY) LIQD Take 237 mLs by mouth daily. 11/09/21   Kathlen Mody, MD  gabapentin (NEURONTIN) 100 MG capsule  Take 1 capsule (100 mg total) by mouth 2 (two) times daily. 11/09/21   Kathlen Mody, MD  metoprolol succinate (TOPROL-XL) 100 MG 24 hr tablet Take 1.5 tablets (150 mg total) by mouth daily. Take with or immediately following a meal. 11/10/21   Nahser, Deloris Ping, MD  Multiple Vitamin (MULTIVITAMIN WITH MINERALS) TABS tablet Take 1 tablet by mouth daily. 11/10/21   Kathlen Mody, MD  traMADol (ULTRAM) 50 MG tablet Take 1-2 tablets (50-100 mg total) by mouth every 12 (twelve) hours as needed (mild pain). 11/09/21   Kathlen Mody, MD    Physical Exam: Constitutional: Moderately built and nourished. Vitals:   11/11/21 0215 11/11/21 0557  BP: (!) 144/82 116/70  Pulse: 82 75  Resp: 14 16  Temp: 98.2 F (36.8 C) 98.1 F (36.7 C)  TempSrc: Oral   SpO2: 98% 99%   Eyes: Anicteric no pallor. ENMT: No discharge from the ears eyes nose and mouth. Neck: No mass felt.  No neck rigidity. Respiratory: No rhonchi or crepitations. Cardiovascular: S1-S2 heard. Abdomen: Soft nontender bowel sound present. Musculoskeletal: No edema. Skin: Mild ecchymotic areas in the left groin. Neurologic: Alert awake oriented to time place and person.  Moves all extremities 5 x 5.  No facial asymmetry tongue is midline pupils equal and reacting to light. Psychiatric: Appears normal.  Normal affect.   Labs on Admission: I have personally reviewed following labs and imaging studies  CBC: Recent Labs  Lab 11/04/21 1739 11/04/21 1746 11/06/21 0520 11/07/21 0714 11/08/21 0955 11/09/21 0301 11/10/21 2235  WBC 12.9*   < > 11.9* 10.2 10.3 7.1 11.2*  NEUTROABS 11.3*  --   --   --   --   --  10.3*  HGB 15.2*   < > 11.2* 10.9* 11.4* 10.0* 10.9*  HCT 44.6   < > 33.0* 33.6* 33.6* 29.3* 32.2*  MCV 88.7   < > 91.9 93.3 91.6 90.2 92.0  PLT 341   < > 257 206 272 251 321   < > = values in this interval not displayed.   Basic Metabolic Panel: Recent Labs  Lab 11/05/21 0430 11/06/21 0520 11/06/21 1559 11/07/21 0714  11/07/21 1558 11/08/21 0955 11/10/21 2235  NA 130* 127* 131* 129* 131* 133* 131*  K 3.6 4.7 4.4 3.8 4.0 4.3 4.4  CL 98 95* 99 98 97* 99 96*  CO2 24 25 22 26 27 25 27   GLUCOSE 163* 125* 109* 103* 154* 111* 161*  BUN 14 24* 17 10 9  7* 9  CREATININE 0.60 1.08* 0.57 0.49 0.53 0.53 0.51  CALCIUM 8.5* 8.6* 8.2* 8.3* 8.6* 8.8* 8.9  MG 1.5* 2.8*  --   --   --  1.7  --   PHOS  --  3.4  --   --   --   --   --  GFR: Estimated Creatinine Clearance: 45.1 mL/min (by C-G formula based on SCr of 0.51 mg/dL). Liver Function Tests: Recent Labs  Lab 11/06/21 0520 11/10/21 2235  AST 28 72*  ALT 17 55*  ALKPHOS 37* 62  BILITOT 1.9* 1.9*  PROT 6.0* 6.0*  ALBUMIN 3.4* 3.3*   No results for input(s): LIPASE, AMYLASE in the last 168 hours. No results for input(s): AMMONIA in the last 168 hours. Coagulation Profile: Recent Labs  Lab 11/05/21 0430 11/06/21 0520 11/07/21 0714 11/09/21 0301 11/10/21 2235  INR 1.2 1.1 1.2 1.3* 1.4*   Cardiac Enzymes: No results for input(s): CKTOTAL, CKMB, CKMBINDEX, TROPONINI in the last 168 hours. BNP (last 3 results) No results for input(s): PROBNP in the last 8760 hours. HbA1C: No results for input(s): HGBA1C in the last 72 hours. CBG: No results for input(s): GLUCAP in the last 168 hours. Lipid Profile: No results for input(s): CHOL, HDL, LDLCALC, TRIG, CHOLHDL, LDLDIRECT in the last 72 hours. Thyroid Function Tests: No results for input(s): TSH, T4TOTAL, FREET4, T3FREE, THYROIDAB in the last 72 hours. Anemia Panel: No results for input(s): VITAMINB12, FOLATE, FERRITIN, TIBC, IRON, RETICCTPCT in the last 72 hours. Urine analysis:    Component Value Date/Time   COLORURINE YELLOW 11/10/2021 2226   APPEARANCEUR HAZY (A) 11/10/2021 2226   LABSPEC 1.015 11/10/2021 2226   PHURINE 7.0 11/10/2021 2226   GLUCOSEU 50 (A) 11/10/2021 2226   HGBUR NEGATIVE 11/10/2021 2226   BILIRUBINUR NEGATIVE 11/10/2021 2226   KETONESUR 80 (A) 11/10/2021 2226    PROTEINUR NEGATIVE 11/10/2021 2226   UROBILINOGEN 1.0 06/18/2014 1344   NITRITE NEGATIVE 11/10/2021 2226   LEUKOCYTESUR TRACE (A) 11/10/2021 2226   Sepsis Labs: @LABRCNTIP (procalcitonin:4,lacticidven:4) ) Recent Results (from the past 240 hour(s))  Blood culture (routine x 2)     Status: None   Collection Time: 11/04/21  5:39 PM   Specimen: BLOOD  Result Value Ref Range Status   Specimen Description   Final    BLOOD LEFT ANTECUBITAL Performed at [facility], [address], [place], [place] [address]    Special Requests   Final    BOTTLES DRAWN AEROBIC AND ANAEROBIC Blood Culture adequate volume Performed at [facility], [address], [place], [place] [address]    Culture   Final    NO GROWTH 5 DAYS Performed at [facility], [address] [place], [place] [address]    Report Status 11/09/2021 FINAL  Final  Blood culture (routine x 2)     Status: None   Collection Time: 11/04/21  5:39 PM   Specimen: BLOOD  Result Value Ref Range Status   Specimen Description   Final    BLOOD Performed at [facility], [address], [place], [place] [address]    Special Requests   Final    BOTTLES DRAWN AEROBIC AND ANAEROBIC Blood Culture results may not be optimal due to an excessive volume of blood received in culture bottles Performed at [facility], [address] [place], [place] [address]    Culture   Final    NO GROWTH 5 DAYS Performed at [facility], [address], [place], [place] [address]    Report Status 11/09/2021 FINAL  Final  Urine Culture     Status: Abnormal   Collection Time: 11/04/21  6:47 PM   Specimen: Urine, Catheterized  Result Value Ref Range Status   Specimen Description   Final    URINE, CATHETERIZED  Performed at [facility], [address], [place], [place] [address]    Special Requests   Final    NONE Performed at [facility], [address], [place], [place] [address]    Culture (A)  Final    >=100,000 COLONIES/mL KLEBSIELLA SPECIES >=100,000 COLONIES/mL PSEUDOMONAS AERUGINOSA    Report Status 11/07/2021 FINAL  Final   Organism ID, Bacteria KLEBSIELLA SPECIES (A)  Final   Organism ID, Bacteria PSEUDOMONAS AERUGINOSA (A)  Final      Susceptibility   Klebsiella species - MIC*    AMPICILLIN RESISTANT Resistant     CEFAZOLIN <=4 SENSITIVE Sensitive     CEFEPIME <=0.12 SENSITIVE Sensitive     CEFTRIAXONE <=0.25 SENSITIVE Sensitive     CIPROFLOXACIN <=0.25 SENSITIVE Sensitive     GENTAMICIN <=1 SENSITIVE Sensitive     IMIPENEM <=0.25 SENSITIVE Sensitive     NITROFURANTOIN 64 INTERMEDIATE Intermediate     TRIMETH/SULFA <=20 SENSITIVE Sensitive     AMPICILLIN/SULBACTAM 4 SENSITIVE Sensitive     PIP/TAZO <=4 SENSITIVE Sensitive     * >=100,000 COLONIES/mL KLEBSIELLA SPECIES   Pseudomonas aeruginosa - MIC*    CEFTAZIDIME 2 SENSITIVE Sensitive     CIPROFLOXACIN <=0.25 SENSITIVE Sensitive     GENTAMICIN <=1 SENSITIVE Sensitive     IMIPENEM 2 SENSITIVE Sensitive     PIP/TAZO <=4 SENSITIVE Sensitive     CEFEPIME 2 SENSITIVE Sensitive     * >=100,000 COLONIES/mL PSEUDOMONAS AERUGINOSA  Resp Panel by RT-PCR (Flu A&B, Covid)     Status: Abnormal   Collection Time: 11/04/21  6:51 PM   Specimen: Nasopharyngeal(NP) swabs in vial transport medium  Result Value Ref Range Status   SARS Coronavirus 2 by RT PCR POSITIVE (A) NEGATIVE Final    Comment: (NOTE) SARS-CoV-2 target nucleic acids are DETECTED.  The SARS-CoV-2 RNA is generally detectable in upper respiratory specimens during the acute phase of infection. Positive results are indicative of the presence of the identified virus, but do not rule out bacterial infection or co-infection with other pathogens not detected by the test. Clinical correlation with patient history and other diagnostic information is necessary to determine patient infection  status. The expected result is Negative.  Fact Sheet for Patients: BloggerCourse.com  Fact Sheet for Healthcare Providers: SeriousBroker.it  This test is not yet approved or cleared by the [place] FDA and  has been authorized for detection and/or diagnosis of SARS-CoV-2 by FDA under an Emergency Use Authorization (EUA).  This EUA will remain in effect (meaning this test can be used) for the duration of  the COVID-19 declaration under Section 564(b)(1) of the A ct, 21 U.S.C. section 360bbb-3(b)(1), unless the authorization is terminated or revoked sooner.     Influenza A by PCR NEGATIVE NEGATIVE Final   Influenza B by PCR NEGATIVE NEGATIVE Final    Comment: (NOTE) The Xpert Xpress SARS-CoV-2/FLU/RSV plus assay is intended as an aid in the diagnosis of influenza from Nasopharyngeal swab specimens and should not be used as a sole basis for treatment. Nasal washings and aspirates are unacceptable for Xpert Xpress SARS-CoV-2/FLU/RSV testing.  Fact Sheet for Patients: BloggerCourse.com  Fact Sheet for Healthcare Providers: SeriousBroker.it  This test is not yet approved or cleared by the [place] FDA and has been authorized for detection and/or diagnosis of SARS-CoV-2 by FDA under an Emergency Use Authorization (EUA). This EUA will remain in effect (meaning this test can be used) for the duration of the COVID-19 declaration under  Section 564(b)(1) of the Act, 21 U.S.C. section 360bbb-3(b)(1), unless the authorization is terminated or revoked.  Performed at [facility], [address], [place], [place] [address]   Resp Panel by RT-PCR (Flu A&B, Covid) Nasopharyngeal Swab     Status: None   Collection Time: 11/11/21  3:42 AM   Specimen: Nasopharyngeal Swab; Nasopharyngeal(NP) swabs in vial transport medium  Result Value Ref Range Status   SARS  Coronavirus 2 by RT PCR NEGATIVE NEGATIVE Final    Comment: (NOTE) SARS-CoV-2 target nucleic acids are NOT DETECTED.  The SARS-CoV-2 RNA is generally detectable in upper respiratory specimens during the acute phase of infection. The lowest concentration of SARS-CoV-2 viral copies this assay can detect is 138 copies/mL. A negative result does not preclude SARS-Cov-2 infection and should not be used as the sole basis for treatment or other patient management decisions. A negative result may occur with  improper specimen collection/handling, submission of specimen other than nasopharyngeal swab, presence of viral mutation(s) within the areas targeted by this assay, and inadequate number of viral copies(<138 copies/mL). A negative result must be combined with clinical observations, patient history, and epidemiological information. The expected result is Negative.  Fact Sheet for Patients:  BloggerCourse.com  Fact Sheet for Healthcare Providers:  SeriousBroker.it  This test is no t yet approved or cleared by the [place] FDA and  has been authorized for detection and/or diagnosis of SARS-CoV-2 by FDA under an Emergency Use Authorization (EUA). This EUA will remain  in effect (meaning this test can be used) for the duration of the COVID-19 declaration under Section 564(b)(1) of the Act, 21 U.S.C.section 360bbb-3(b)(1), unless the authorization is terminated  or revoked sooner.       Influenza A by PCR NEGATIVE NEGATIVE Final   Influenza B by PCR NEGATIVE NEGATIVE Final    Comment: (NOTE) The Xpert Xpress SARS-CoV-2/FLU/RSV plus assay is intended as an aid in the diagnosis of influenza from Nasopharyngeal swab specimens and should not be used as a sole basis for treatment. Nasal washings and aspirates are unacceptable for Xpert Xpress SARS-CoV-2/FLU/RSV testing.  Fact Sheet for  Patients: BloggerCourse.com  Fact Sheet for Healthcare Providers: SeriousBroker.it  This test is not yet approved or cleared by the [place] FDA and has been authorized for detection and/or diagnosis of SARS-CoV-2 by FDA under an Emergency Use Authorization (EUA). This EUA will remain in effect (meaning this test can be used) for the duration of the COVID-19 declaration under Section 564(b)(1) of the Act, 21 U.S.C. section 360bbb-3(b)(1), unless the authorization is terminated or revoked.  Performed at [facility], [address], [place], [place] [address]      Radiological Exams on Admission: DG Chest 2 View  Result Date: 11/10/2021 CLINICAL DATA:  Dizziness. EXAM: CHEST - 2 VIEW COMPARISON:  Radiograph dated 11/07/2021. FINDINGS: The heart size and mediastinal contours are within normal limits. Both lungs are clear. The visualized skeletal structures are unremarkable. IMPRESSION: No active cardiopulmonary disease. Electronically Signed   By: Elgie Collard M.D.   On: 11/10/2021 23:02   CT Head Wo Contrast  Result Date: 11/10/2021 CLINICAL DATA:  Dizziness, persistent/recurrent, cardiac or vascular cause suspected. EXAM: CT HEAD WITHOUT CONTRAST TECHNIQUE: Contiguous axial images were obtained from the base of the skull through the vertex without intravenous contrast. RADIATION DOSE REDUCTION: This exam was performed according to the departmental dose-optimization program which includes automated exposure control, adjustment of the mA and/or kV according to patient size and/or  use of iterative reconstruction technique. COMPARISON:  11/07/2021. FINDINGS: Brain: No acute intracranial hemorrhage, midline shift or mass effect. No extra-axial fluid collection. Mild atrophy is noted. No hydrocephalus. Vascular: No hyperdense vessel or unexpected calcification. Skull: Normal. Negative for fracture or focal lesion.  Sinuses/Orbits: No acute finding. Other: None. IMPRESSION: No acute intracranial process. Electronically Signed   By: Thornell Sartorius M.D.   On: 11/10/2021 23:19   CT ABDOMEN PELVIS W CONTRAST  Result Date: 11/11/2021 CLINICAL DATA:  Upset stomach, bowel obstruction suspected. Status post emergency left inguinal hernia repair. EXAM: CT ABDOMEN AND PELVIS WITH CONTRAST TECHNIQUE: Multidetector CT imaging of the abdomen and pelvis was performed using the standard protocol following bolus administration of intravenous contrast. RADIATION DOSE REDUCTION: This exam was performed according to the departmental dose-optimization program which includes automated exposure control, adjustment of the mA and/or kV according to patient size and/or use of iterative reconstruction technique. CONTRAST:  OMNIPAQUE IOHEXOL 300 MG/ML  SOLN COMPARISON:  11/04/2021. FINDINGS: Lower chest: Atelectasis is present at the lung bases. Hepatobiliary: No focal liver abnormality is seen. No gallstones, gallbladder wall thickening, or biliary dilatation. Pancreas: Pancreatic atrophy is noted. No pancreatic ductal dilatation or surrounding inflammatory changes. Spleen: Normal in size without focal abnormality. Adrenals/Urinary Tract: No adrenal nodule or mass. The kidneys enhance symmetrically. Subcentimeter hypodensities are present in the lower pole the kidneys which are too small to further characterize. No renal calculus or hydronephrosis. A Foley catheter is present in the urinary bladder and the bladder is decompressed. Stomach/Bowel: There is a small hiatal hernia. The stomach is otherwise unchanged. A large duodenal diverticulum is noted. No bowel obstruction is identified. No focal bowel wall thickening. The appendix is not definitely seen on exam. Vascular/Lymphatic: No significant vascular findings are present. No enlarged abdominal or pelvic lymph nodes. Reproductive: There is pelvic organ prolapse, not significantly changed  from the prior exam. Hardware artifact limits evaluation. Other: A small amount of ascites is noted in abdomen and pelvis. There is a fluid collection in the left lower quadrant containing foci of air which is continuous with the left inguinal hernia and extends into the anterior thigh on the left. The portion of the collection in the pelvis on the left measures 7.5 x 3.4 cm. The collection extending through the left inguinal hernia and anterior left thigh measures 10.0 x 3.3 cm and is partially excluded from the field of view. A few foci of air is seen in the collections which is likely postsurgical. Hyperdense material is also noted in the collection in the left thigh, possibly representing blood products. 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 Degenerative changes are present in the thoracolumbar spine. No acute osseous abnormality is identified. IMPRESSION: 1. No bowel obstruction. 2. Status post left inguinal hernia repair. There is a fluid collection in the pelvis in the left lower quadrant communicating through the hernia tract and into the anterior right thigh containing air, concerning for dehiscence. Findings may represent seroma or hematoma. Clinical correlation is recommended to exclude superimposed abscess. Surgical consultation is recommended. 3. Remaining chronic findings are unchanged. Critical findings were reported to Dr. Blinda Leatherwood at 3:59 a.m. Electronically Signed   By: Thornell Sartorius M.D.   On: 11/11/2021 04:02    EKG: Independently reviewed.  Normal sinus rhythm.  Assessment/Plan Principal Problem:   Dizziness Active Problems:   Paroxysmal atrial fibrillation with RVR (HCC)   Essential hypertension   Hyperlipidemia    Dizziness with nausea and vomiting -  we will get MRI of the brain to rule out stroke.  If patient does rule in for stroke will need further work-up and consult neurology and also discuss about anticoagulation. Recent incarcerated left  inguinal hernia status post surgery CT scan showing possible wound dehiscence and possible hematoma seen in the CAT scan.  Will consult surgery. A-fib presently in sinus rhythm on metoprolol and Eliquis.  Holding Eliquis until seen by surgery for possible wound dehiscence and possible hematoma.Also need to discuss with neurology idf patient rules in for stroke. Elevated LFTs appears to be new.  CT scan does not show any abnormality in the liver or pancreas.  Will follow LFTs check hepatitis panel.  If LFTs worsen may have to hold Lipitor. Recent stroke on Lipitor and Eliquis.  Eliquis on hold until seen by surgery for possible hematoma at surgicall site.   DVT prophylaxis: SCDs.  Continue Eliquis if okay with surgery and if patient rules in for stroke then discuss with neurology. Code Status: Full code. Family Communication: Patient's daughter. Disposition Plan: Home. Consults called: Neurosurgery. Admission status: Observation.   Eduard Clos MD [facility] Pager [phone].  If 7PM-7AM, please contact night-coverage [email or web address] Password [email or web address]  11/11/2021, 6:06 AM

## 2021-11-11 NOTE — Progress Notes (Signed)
?  Seen after midnight ? ?76 year old white female paroxysmal A-fib on Coumadin ?Left inguinal hernia ?Recent admission for incarceration on 2/22-during that admission she developed a stroke with right-sided facial numbness right arm weakness and was transferred to [facility] on 2/25-the MRI showed a small acute cortical subcortical left frontal parietal infarct and patient was placed on Eliquis on discharge ?Hospitalization significant for A-fib RVR in addition as well as always detectable COVID-19 and asymptomatic from this ? ?Patient represented after cardiology office visit on 2/28 stating she was dizzy and had nausea and was throwing up ?CT head was unremarkable WBC 11 LFTs mildly up sodium 128 ?CT abdomen pelvis showed?  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 ?Anticoagulation has been held at this time given size of stroke and risk for decompensation--- neurology to decide about further planning with regards to the same ? ?I appreciate general surgery input ,at this time no operative measures or management is planned per general surgery ? ? ?No charge note ?Hannah Griffes, MD ?[facility] ?4:06 PM ? ? ?

## 2021-11-11 NOTE — Plan of Care (Signed)
Reviewed the size of the R cerebellum stroke. Appears to be a medium to large size stroke in the right PICA territory. We usually would be okay to keep these patients at [facility] specially if they are admitted but given the size of her stroke, there is a small chance that the resulting mass effect could result in significant crowding of the posterior fossa. If this were to happen, she would benefit from being at [facility] where we have neurology and neurosurgery available 24/7 and have the ability to initiate osmotic therapy in case she needs it or offer decompression. I would therefore recommend transferring her to [facility] to a progressive bed. Again there if a small chance that this may happen and we want her to have appropriate specialties available in case this were to happen. ? ?I discussed this with Dr. Verneita Griffes. ? ?Donnetta Simpers ?[facility] ?Pager Number [phone] ? ?

## 2021-11-11 NOTE — Evaluation (Signed)
Occupational Therapy Evaluation ?Patient Details ?Name: Hannah Downs ?MRN: [id number] ?DOB: Oct 14, 1945 ?Today's Date: 11/11/2021 ? ? ?History of Present Illness Pt. is a 76 y.o. female presenting to [facility] on 3/1 with complaints of dizziness. She was recently released Monday (2/27) after being admitted for an incarcerated hernia and subsequently having a small left frontoparietal infart without deficits. 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  PMH: afib, arthritis, GERD, h/o hernias with previous surgeries  ? ?Clinical Impression ?  ?Hannah Downs is a 76 year old woman. She is typically very independent and active at home - performs her own cooking, cleaning and takes care of her dogs. Today she presents with impaired balance and states "I feel drunk." She has no visual deficits or deficits of the upper extremities. She is able to transfer to side of bed with min guard and min assist to stand and take steps forward and back. She is unable to maintain upright position without external assistance and all activity is limited to side of bed for now. Patient will benefit from skilled OT services while in hospital to improve deficits and learn compensatory strategies as needed in order to return to PLOF.  Recommend aggressive short term rehab at discharge to return patient to prior high level.  ?   ? ?Recommendations for follow up therapy are one component of a multi-disciplinary discharge planning process, led by the attending physician.  Recommendations may be updated based on patient status, additional functional criteria and insurance authorization.  ? ?Follow Up Recommendations ? Acute inpatient rehab (3hours/day)  ?  ?Assistance Recommended at Discharge Frequent or constant Supervision/Assistance  ?Patient can return home with the following A little help with walking and/or transfers;A little help with bathing/dressing/bathroom;Assistance with cooking/housework;Help with stairs  or ramp for entrance ? ?  ?Functional Status Assessment ? Patient has had a recent decline in their functional status and demonstrates the ability to make significant improvements in function in a reasonable and predictable amount of time.  ?Equipment Recommendations ? None recommended by OT  ?  ?Recommendations for Other Services   ? ? ?  ?Precautions / Restrictions Precautions ?Precautions: Fall ?Precaution Comments: recently abdominal surgery, permanent catheter ?Restrictions ?Weight Bearing Restrictions: No  ? ?  ? ?   ?Balance Overall balance assessment: Needs assistance ?Sitting-balance support: No upper extremity supported, Feet supported ?Sitting balance-Leahy Scale: Fair ?  ?  ?  ?Standing balance-Leahy Scale: Poor ?Standing balance comment: Reliant on external assistance ?  ?  ?  ?  ?  ?  ?  ?  ?  ?  ?  ?   ? ?ADL either performed or assessed with clinical judgement  ? ?ADL Overall ADL's : Needs assistance/impaired ?Eating/Feeding: Set up;Sitting ?  ?Grooming: Set up;Sitting ?  ?Upper Body Bathing: Set up;Sitting ?  ?Lower Body Bathing: Minimal assistance;Sit to/from stand ?Lower Body Bathing Details (indicate cue type and reason): steadying required for standing ?Upper Body Dressing : Set up;Sitting ?  ?Lower Body Dressing: Minimal assistance;Sit to/from stand ?Lower Body Dressing Details (indicate cue type and reason): to stand and pull up clothing ?Toilet Transfer: Minimal assistance;BSC/3in1 ?  ?Toileting- Architect and Hygiene: Sit to/from stand;Moderate assistance ?Toileting - Clothing Manipulation Details (indicate cue type and reason): for clothing management ?  ?  ?Functional mobility during ADLs: Minimal assistance ?General ADL Comments: Supervision to transfer to side of bed. Min assist for steadying to ambulate 3 feet forward and 3 feet back.  ? ? ? ?  Vision Patient Visual Report: No change from baseline ?Additional Comments: Reports normal vision, able to read the clock and locate  finges to the left and right  ?   ?Perception   ?  ?Praxis   ?  ? ?Pertinent Vitals/Pain Pain Assessment ?Pain Assessment: No/denies pain  ? ? ? ?Hand Dominance Left ?  ?Extremity/Trunk Assessment Upper Extremity Assessment ?Upper Extremity Assessment: RUE deficits/detail;LUE deficits/detail ?RUE Deficits / Details: WFL ROM, 5/5 strength, normal coordination ?RUE Sensation: WNL ?RUE Coordination: WNL ?LUE Deficits / Details: WFL ROM, 5/5 strength ?LUE Sensation: WNL ?LUE Coordination: WNL ?  ?Lower Extremity Assessment ?Lower Extremity Assessment: Defer to PT evaluation ?  ?Cervical / Trunk Assessment ?Cervical / Trunk Assessment: Other exceptions ?Cervical / Trunk Exceptions: abdominal surgery on 2/23 ?  ?Communication Communication ?Communication: No difficulties ?  ?Cognition Arousal/Alertness: Awake/alert ?Behavior During Therapy: [facility] for tasks assessed/performed ?Overall Cognitive Status: Within Functional Limits for tasks assessed ?  ?  ?  ?  ?  ?  ?  ?  ?  ?  ?  ?  ?  ?  ?  ?  ?  ?  ?  ?General Comments  Heel to shin-WNL, smooth pursuits mild slip with R eye, saccades- WNL ? ?  ?Exercises   ?  ?Shoulder Instructions    ? ? ?Home Living Family/patient expects to be discharged to:: Private residence ?Living Arrangements: Spouse/significant other;Children (1 daughter and grandchild) ?Available Help at Discharge: Family;Available 24 hours/day ?Type of Home: House ?Home Access: Stairs to enter ?Entrance Stairs-Number of Steps: 4 ?Entrance Stairs-Rails: Right ?Home Layout: One level ?  ?  ?Bathroom Shower/Tub: Walk-in shower ?  ?Bathroom Toilet: Handicapped height ?Bathroom Accessibility: Yes ?  ?Home Equipment: Educational psychologist (4 wheels) ?  ?  ?  ? ?  ?Prior Functioning/Environment Prior Level of Function : Independent/Modified Independent ?  ?  ?  ?  ?  ?  ?Mobility Comments: indep, no AD, doesn't drive ?ADLs Comments: indep ?  ? ?  ?  ?OT Problem List: Impaired balance (sitting and/or standing);Decreased  activity tolerance;Decreased knowledge of use of DME or AE ?  ?   ?OT Treatment/Interventions: Self-care/ADL training;Therapeutic exercise;Neuromuscular education;DME and/or AE instruction;Therapeutic activities;Balance training;Patient/family education  ?  ?OT Goals(Current goals can be found in the care plan section) Acute Rehab OT Goals ?Patient Stated Goal: To return to independence ?OT Goal Formulation: With patient/family ?Time For Goal Achievement: 11/25/21 ?Potential to Achieve Goals: Good  ?OT Frequency: Min 2X/week ?  ? ?Co-evaluation   ?  ?  ?  ?  ? ?  ?AM-PAC OT "6 Clicks" Daily Activity     ?Outcome Measure Help from another person eating meals?: None ?Help from another person taking care of personal grooming?: A Little ?Help from another person toileting, which includes using toliet, bedpan, or urinal?: A Lot ?Help from another person bathing (including washing, rinsing, drying)?: A Little ?Help from another person to put on and taking off regular upper body clothing?: A Little ?Help from another person to put on and taking off regular lower body clothing?: A Little ?6 Click Score: 18 ?  ?End of Session Nurse Communication: Mobility status ? ?Activity Tolerance: Patient tolerated treatment well ?Patient left: in bed;with call bell/phone within reach;with family/visitor present;with nursing/sitter in room ? ?OT Visit Diagnosis: Unsteadiness on feet (R26.81);Dizziness and giddiness (R42)  ?              ?Time: 2725-3664 ?OT Time Calculation (min): 12 min ?Charges:  OT  General Charges ?$OT Visit: 1 Visit ?OT Evaluation ?$OT Eval Low Complexity: 1 Low ? ?Janica Eldred, OTR/L ?[facility] Re[facility] Services  ?Office [phone] ?Pager: [phone]  ? ?Doranne Schmutz L Shamira Toutant ?11/11/2021, 2:34 PM ?

## 2021-11-11 NOTE — Progress Notes (Signed)
Subjective: This is a patient known to our service who came in with an incarcerated left inguinal hernia and underwent urgent repair of this along with the right side as well.  She had a large post op seroma that was known.  She states that since she was here she has been eating well, moving her bowels, and this fluid area has decreased in size significantly.  She has no pain except minimally at her port sites.  She has been readmitted for a new CVA and is [facility].  She underwent a CT of her abdomen/pel last night for unclear reasons and was noted to have a possible "dehiscence at the hernia site with fluid in the canal ? Seroma or hematoma".  We have been asked to see her for evaluation of her groin prior to transfer.  ROS: See above, otherwise other systems negative  Objective: Vital signs in last 24 hours: Temp:  [97.8 F (36.6 C)-98.4 F (36.9 C)] 98.4 F (36.9 C) (03/01 0943) Pulse Rate:  [69-82] 79 (03/01 0943) Resp:  [14-16] 16 (03/01 0943) BP: (113-144)/(70-87) 113/72 (03/01 0943) SpO2:  [97 %-100 %] 97 % (03/01 0943) Weight:  [56.2 kg] 56.2 kg (03/01 0927) Last BM Date : 11/11/21  Intake/Output from previous day: 02/28 0701 - 03/01 0700 In: -  Out: 500 [Urine:500] Intake/Output this shift: No intake/output data recorded.  PE: Abd: soft, essentially nontender, ND, all port sites are healing well.  Late stages of ecchymosis noted around port sites c/w healing.  Slightly fullness noted in L groin, but no erythema, pain, warmth etc noted concerning for recurrence or infection.    Lab Results:  Recent Labs    11/10/21 2235 11/11/21 0620  WBC 11.2* 10.1  HGB 10.9* 10.4*  HCT 32.2* 31.2*  PLT 321 312   BMET Recent Labs    11/10/21 2235 11/11/21 0620  NA 131* 128*  K 4.4 4.1  CL 96* 93*  CO2 27 28  GLUCOSE 161* 121*  BUN 9 12  CREATININE 0.51 0.39*  CALCIUM 8.9 8.7*   PT/INR Recent Labs    11/09/21 0301 11/10/21 2235  LABPROT 15.9*  16.9*  INR 1.3* 1.4*   CMP     Component Value Date/Time   NA 128 (L) 11/11/2021 0620   NA 137 08/03/2021 1247   K 4.1 11/11/2021 0620   CL 93 (L) 11/11/2021 0620   CO2 28 11/11/2021 0620   GLUCOSE 121 (H) 11/11/2021 0620   BUN 12 11/11/2021 0620   BUN 14 08/03/2021 1247   CREATININE 0.39 (L) 11/11/2021 0620   CALCIUM 8.7 (L) 11/11/2021 0620   PROT 5.8 (L) 11/11/2021 0620   ALBUMIN 3.2 (L) 11/11/2021 0620   AST 59 (H) 11/11/2021 0620   ALT 52 (H) 11/11/2021 0620   ALKPHOS 57 11/11/2021 0620   BILITOT 1.9 (H) 11/11/2021 0620   GFRNONAA >60 11/11/2021 0620   GFRAA >90 06/27/2014 0425   Lipase  No results found for: LIPASE     Studies/Results: DG Chest 2 View  Result Date: 11/10/2021 CLINICAL DATA:  Dizziness. EXAM: CHEST - 2 VIEW COMPARISON:  Radiograph dated 11/07/2021. FINDINGS: The heart size and mediastinal contours are within normal limits. Both lungs are clear. The visualized skeletal structures are unremarkable. IMPRESSION: No active cardiopulmonary disease. Electronically Signed   By: Anner Crete M.D.   On: 11/10/2021 23:02   CT Head Wo Contrast  Result Date: 11/10/2021 CLINICAL DATA:  Dizziness, persistent/recurrent,  cardiac or vascular cause suspected. EXAM: CT HEAD WITHOUT CONTRAST TECHNIQUE: Contiguous axial images were obtained from the base of the skull through the vertex without intravenous contrast. RADIATION DOSE REDUCTION: This exam was performed according to the departmental dose-optimization program which includes automated exposure control, adjustment of the mA and/or kV according to patient size and/or use of iterative reconstruction technique. COMPARISON:  11/07/2021. FINDINGS: Brain: No acute intracranial hemorrhage, midline shift or mass effect. No extra-axial fluid collection. Mild atrophy is noted. No hydrocephalus. Vascular: No hyperdense vessel or unexpected calcification. Skull: Normal. Negative for fracture or focal lesion. Sinuses/Orbits: No  acute finding. Other: None. IMPRESSION: No acute intracranial process. Electronically Signed   By: Brett Fairy M.D.   On: 11/10/2021 23:19   MR BRAIN WO CONTRAST  Result Date: 11/11/2021 CLINICAL DATA:  76 year old female with dizziness. Recent small posterior left MCA, perirolandic infarct on 11/07/2021. EXAM: MRI HEAD WITHOUT CONTRAST TECHNIQUE: Multiplanar, multiecho pulse sequences of the brain and surrounding structures were obtained without intravenous contrast. COMPARISON:  Brain MRI and CTA head and neck 11/07/2021. FINDINGS: Brain: Confluent new right cerebellar PICA territory restricted diffusion in an area of about 4 cm (series 5, image 54). Diffusion abnormality tracks into the right cerebellar vermis on image 57. T2 and FLAIR hyperintense cytotoxic edema with no hemorrhage or mass effect. No other abnormal posterior fossa diffusion. Patchy left MCA territory perirolandic infarct is stable. Mild cortical cytotoxic edema there with no malignant hemorrhagic transformation. Nearby petechial hemorrhage versus underlying chronic microhemorrhage on series 9, image 33 is stable. No right hemisphere or deep gray nuclei restricted diffusion. Stable gray and white matter signal elsewhere. No other chronic cerebral blood products. No midline shift, mass effect, evidence of mass lesion, ventriculomegaly, extra-axial collection or acute intracranial hemorrhage. Cervicomedullary junction and pituitary are within normal limits. Vascular: Major intracranial vascular flow voids are stable, including dominant appearance of the distal right vertebral artery. Skull and upper cervical spine: Stable visible cervical spine disc and endplate degeneration. Visualized bone marrow signal is within normal limits. Sinuses/Orbits: Stable, negative. Other: Mastoids remain clear. Visible internal auditory structures appear normal. IMPRESSION: 1. Positive for Acute Right Cerebellar PICA territory infarct. No associated hemorrhage  or mass effect. 2. 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 No malignant hemorrhagic transformation or mass effect. Electronically Signed   By: Genevie Ann M.D.   On: 11/11/2021 07:16   CT ABDOMEN PELVIS W CONTRAST  Result Date: 11/11/2021 CLINICAL DATA:  Upset stomach, bowel obstruction suspected. Status post emergency left inguinal hernia repair. EXAM: CT ABDOMEN AND PELVIS WITH CONTRAST TECHNIQUE: Multidetector CT imaging of the abdomen and pelvis was performed using the standard protocol following bolus administration of intravenous contrast. RADIATION DOSE REDUCTION: This exam was performed according to the departmental dose-optimization program which includes automated exposure control, adjustment of the mA and/or kV according to patient size and/or use of iterative reconstruction technique. CONTRAST:  18mL OMNIPAQUE IOHEXOL 300 MG/ML  SOLN COMPARISON:  11/04/2021. FINDINGS: Lower chest: Atelectasis is present at the lung bases. Hepatobiliary: No focal liver abnormality is seen. No gallstones, gallbladder wall thickening, or biliary dilatation. Pancreas: Pancreatic atrophy is noted. No pancreatic ductal dilatation or surrounding inflammatory changes. Spleen: Normal in size without focal abnormality. Adrenals/Urinary Tract: No adrenal nodule or mass. The kidneys enhance symmetrically. Subcentimeter hypodensities are present in the lower pole the kidneys which are too small to further characterize. No renal calculus or hydronephrosis. A Foley catheter is present in the urinary bladder  and the bladder is decompressed. Stomach/Bowel: There is a small hiatal hernia. The stomach is otherwise unchanged. A large duodenal diverticulum is noted. No bowel obstruction is identified. No focal bowel wall thickening. The appendix is not definitely seen on exam. Vascular/Lymphatic: No significant vascular findings are present. No enlarged abdominal or pelvic lymph nodes.  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 Hardware artifact limits evaluation. Other: A small amount of ascites is noted in abdomen and pelvis. There is a fluid collection in the left lower quadrant containing foci of air which is continuous with the left inguinal hernia and extends into the anterior thigh on the left. The portion of the collection in the pelvis on the left measures 7.5 x 3.4 cm. The collection extending through the left inguinal hernia and anterior left thigh measures 10.0 x 3.3 cm and is partially excluded from the field of view. A few foci of air is seen in the collections which is likely postsurgical. Hyperdense material is also noted in the collection in the left thigh, possibly representing blood products. 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 Degenerative changes are present in the thoracolumbar spine. No acute osseous abnormality is identified. IMPRESSION: 1. No bowel obstruction. 2. Status post left inguinal hernia repair. There is a fluid collection in the pelvis in the left lower quadrant communicating through the hernia tract and into the anterior right thigh containing air, concerning for dehiscence. Findings may represent seroma or hematoma. Clinical correlation is recommended to exclude superimposed abscess. Surgical consultation is recommended. 3. Remaining chronic findings are unchanged. Critical findings were reported to Dr. Betsey Holiday at 3:59 a.m. Electronically Signed   By: Brett Fairy M.D.   On: 11/11/2021 04:02    Anti-infectives: Anti-infectives (From admission, onward)    None        Assessment/Plan Post op L groin seroma, s/p B inguinal/femoral hernia repair by Dr. Johney Maine 2/23 The patient has been seen and imaging reviewed and reviewed with radiology.  No concerning findings noted for hernia failure or infection.  She has a post op seroma that has been present since right after  her surgery.  This is actually improving and reabsorbing.  No acute intervention is needed for this.  She may follow up with Dr. Johney Maine as an outpatient as previously arranged.  No further surgical concerns.  Discussed with Dr. Verlon Au   LOS: 0 days    Henreitta Cea , [facility] 11/11/2021, 11:16 AM Please see Amion for pager number during day hours 7:00am-4:30pm or 7:00am -11:30am on weekends

## 2021-11-11 NOTE — Care Management Obs Status (Signed)
MEDICARE OBSERVATION STATUS NOTIFICATION ? ? ?Patient Details  ?Name: Hannah Downs ?MRN: [id number] ?Date of Birth: July 08, 1946 ? ? ?Medicare Observation Status Notification Given:  Yes ? ? ? ?Dessa Phi, RN ?11/11/2021, 12:49 PM ?

## 2021-11-11 NOTE — Progress Notes (Signed)
? ?  Inpatient Rehab Admissions Coordinator : ? ?Per therapy recommendations, patient was screened for CIR candidacy by Ottie Glazier RN MSN.  At this time patient appears to be a potential candidate for CIR. I will place a rehab consult per protocol for full assessment. Noted plans to transfer to [facility] for monitoring.Please call me with any questions. ? ?Ottie Glazier RN MSN ?Admissions Coordinator ?[phone] ?  ?

## 2021-11-12 DIAGNOSIS — K91872 Postprocedural seroma of a digestive system organ or structure following a digestive system procedure: Secondary | ICD-10-CM | POA: Diagnosis not present

## 2021-11-12 DIAGNOSIS — E871 Hypo-osmolality and hyponatremia: Secondary | ICD-10-CM

## 2021-11-12 DIAGNOSIS — I639 Cerebral infarction, unspecified: Secondary | ICD-10-CM

## 2021-11-12 DIAGNOSIS — I480 Paroxysmal atrial fibrillation: Secondary | ICD-10-CM | POA: Diagnosis not present

## 2021-11-12 DIAGNOSIS — I10 Essential (primary) hypertension: Secondary | ICD-10-CM | POA: Diagnosis not present

## 2021-11-12 DIAGNOSIS — E785 Hyperlipidemia, unspecified: Secondary | ICD-10-CM

## 2021-11-12 LAB — COMPREHENSIVE METABOLIC PANEL
ALT: 77 U/L — ABNORMAL HIGH (ref 0–44)
AST: 84 U/L — ABNORMAL HIGH (ref 15–41)
Albumin: 3.1 g/dL — ABNORMAL LOW (ref 3.5–5.0)
Alkaline Phosphatase: 56 U/L (ref 38–126)
Anion gap: 8 (ref 5–15)
BUN: 7 mg/dL — ABNORMAL LOW (ref 8–23)
CO2: 31 mmol/L (ref 22–32)
Calcium: 8.7 mg/dL — ABNORMAL LOW (ref 8.9–10.3)
Chloride: 94 mmol/L — ABNORMAL LOW (ref 98–111)
Creatinine, Ser: 0.61 mg/dL (ref 0.44–1.00)
GFR, Estimated: >60 mL/min (ref >60)
Glucose, Bld: 158 mg/dL — ABNORMAL HIGH (ref 70–99)
Potassium: 3.5 mmol/L (ref 3.5–5.1)
Sodium: 133 mmol/L — ABNORMAL LOW (ref 135–145)
Total Bilirubin: 1.2 mg/dL (ref 0.3–1.2)
Total Protein: 5.8 g/dL — ABNORMAL LOW (ref 6.5–8.1)

## 2021-11-12 LAB — PROTIME-INR
INR: 1.1 (ref 0.8–1.2)
Prothrombin Time: 14.5 seconds (ref 11.4–15.2)

## 2021-11-12 LAB — HEPATITIS PANEL, ACUTE

## 2021-11-12 LAB — HCV INTERPRETATION

## 2021-11-12 MED ORDER — WARFARIN - PHYSICIAN DOSING INPATIENT: Freq: Every day | Status: DC

## 2021-11-12 MED ORDER — PANTOPRAZOLE SODIUM 40 MG PO TBEC
40.0000 mg | DELAYED_RELEASE_TABLET | Freq: Two times a day (BID) | ORAL | Status: DC
  Administered 2021-11-12 – 2021-11-13 (×2): 40 mg via ORAL
  Filled 2021-11-12 (×2): qty 1

## 2021-11-12 MED ORDER — WARFARIN SODIUM 5 MG PO TABS
5.0000 mg | ORAL_TABLET | Freq: Every day | ORAL | Status: DC
  Administered 2021-11-12: 5 mg via ORAL
  Filled 2021-11-12: qty 1

## 2021-11-12 NOTE — Assessment & Plan Note (Signed)
Patient with recent bilateral inguinal/femoral hernia repair by Dr. Johney Maine on 11/05/2021. CT abdomen/pelvis on admission with no bowel perforation, s/p left inguinal hernia repair with fluid collection in the pelvis, left lower quadrant communicating through the hernia tracking into air and Hirth right thigh containing air, concerning for dehiscence versus seroma or hematoma versus superimposed abscess.  Evaluated by general surgery on 11/11/2021, etiology secondary to postoperative left groin seroma which is improving and reabsorbing, no acute intervention needed.  Outpatient follow-up with Dr. Johney Maine as previously arranged. ?

## 2021-11-12 NOTE — Assessment & Plan Note (Addendum)
Patient presenting to ED with dizziness associated nausea and vomiting.  MR brain without contrast with subacute infarct left frontal lobe and acute/subacute infarct left parietal lobe.  TTE 2/26 with LVEF 60 to 65%, no thrombus noted.  LDL 43, hemoglobin A1c 5.4.  Neurology was consulted and followed during hospital course.  Started on aspirin 81 mg p.o. daily.  Continued on atorvastatin 20 mg p.o. daily.  Eliquis was discontinued and patient was restarted on Coumadin 5 mg p.o. daily.  Seen by physical and Occupational Therapy with recommendations of home health on discharge.  Will need close follow-up with cardiology for repeat INR next week.  Patient also to follow-up with neurology as previously scheduled. ?

## 2021-11-12 NOTE — Hospital Course (Signed)
Kimbery RANNA KNOEDLER is a 76 year old female with past medical history significant for paroxysmal atrial fibrillation on Eliquis (recently changed from Coumadin), hyperlipidemia, recently admitted for incarcerated left inguinal hernia with hospitalization complicated by atrial fibrillation with RVR and CVA who was discharged 2 days prior who presented to the ED with dizziness after visiting the cardiology office.  Following outpatient cardiology visit, patient started feeling dizzy, nausea with episode of vomiting.  Denies chest pain or shortness of breath.  Denies any weakness of extremities or no visual symptoms. ? ?Patient initially presented to [facility], but due to wait time presented to [facility] long ED for evaluation. ? ?In the ED, temperature 98.2 ?F, HR 82, RR 14, BP 144/82, SPO2 98% on room air.  WBC 10.1, hemoglobin 10.4, platelets 312.  Sodium 128, potassium 4.1, chloride 93, CO2 28, glucose 121, creatinine 0.39, BUN 12, AST 59, ALT 52, total bilirubin 1.5.  COVID-19 PCR negative.  Influenza A/B PCR negative.  CT abdomen/pelvis with no bowel perforation, s/p left inguinal hernia repair with fluid collection in the pelvis, left lower quadrant communicating through the hernia tracking into air and Hirth right thigh containing air, concerning for dehiscence versus seroma or hematoma versus superimposed abscess.  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  Allergy was consulted.  Patient was transferred to [facility] for further evaluation and management under the hospitalist service. ?

## 2021-11-12 NOTE — Assessment & Plan Note (Addendum)
Sodium 128 on admission, likely secondary to hypovolemic hyponatremia in the setting of nausea/vomiting.  Sodium improved to 135 at time of discharge. ?

## 2021-11-12 NOTE — Progress Notes (Signed)
Physical Therapy Treatment ?Patient Details ?Name: Hannah Downs ?MRN: [id number] ?DOB: [birth year] ?Today's Date: 11/12/2021 ? ? ?History of Present Illness Pt. is a 76 y.o. female presenting to [facility] on 3/1 with complaints of dizziness. She was recently released Monday (2/27) after being admitted for an incarcerated hernia and subsequently having a small left frontoparietal infart without deficits. 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  PMH: afib, arthritis, GERD, h/o hernias with previous surgeries ? ?  ?PT Comments  ? ? Focus of session today functional mobility, tranfers, and gait tolerance. The patient tolerated well.  Pt. Shows overall improvement with positional tolerance, transfer ability, and gait. Dizziness and tolerance to higher level balance challenges are still limiting function. Pt. Would benefit from skilled PT to continue to progress balance demands. Plan and discharge should be updated to home with outpatient follow due to patient's functional level and high level of support. Pt to follow acutely as appropriate.  ?   ?Recommendations for follow up therapy are one component of a multi-disciplinary discharge planning process, led by the attending physician.  Recommendations may be updated based on patient status, additional functional criteria and insurance authorization. ? ?Follow Up Recommendations ? Outpatient PT ?  ?  ?Assistance Recommended at Discharge Set up Supervision/Assistance  ?Patient can return home with the following A little help with walking and/or transfers;A little help with bathing/dressing/bathroom;Assist for transportation ?  ?Equipment Recommendations ? None recommended by PT  ?  ?Recommendations for Other Services   ? ? ?  ?Precautions / Restrictions Precautions ?Precautions: Fall ?Precaution Comments: recently abdominal surgery, permanent catheter ?Restrictions ?Weight Bearing Restrictions: No  ?  ? ?Mobility ? Bed Mobility ?Overal bed mobility:  Needs Assistance ?Bed Mobility: Supine to Sit, Sit to Supine ?  ?  ?  ?Sit to supine: Min guard ?Sit to sidelying: Min guard ?General bed mobility comments: Min guard for saftey ?  ? ?Transfers ?Overall transfer level: Needs assistance ?Equipment used: Rolling walker (2 wheels) ?Transfers: Sit to/from Stand ?Sit to Stand: Min guard ?  ?  ?  ?  ?  ?General transfer comment: Min guard for saftey, pt. able to self steady ?  ? ?Ambulation/Gait ?Ambulation/Gait assistance: Min guard ?Gait Distance (Feet): 150 Feet ?Assistive device: Rolling walker (2 wheels) ?Gait Pattern/deviations: Step-through pattern, Decreased stride length, Trunk flexed ?  ?  ?  ?General Gait Details: Pt. with improved ambulation tolerance, states that she has slightly increased dizziness compared to laying in bed. It quickly settles when she stands still. ? ? ?Stairs ?  ?  ?  ?  ?  ? ? ?Wheelchair Mobility ?  ? ?Modified Rankin (Stroke Patients Only) ?Modified Rankin (Stroke Patients Only) ?Pre-Morbid Rankin Score: Slight disability ?Modified Rankin: Moderate disability ? ? ?  ?Balance Overall balance assessment: Needs assistance ?Sitting-balance support: No upper extremity supported, Feet supported ?Sitting balance-Leahy Scale: Fair ?  ?  ?Standing balance support: Bilateral upper extremity supported ?Standing balance-Leahy Scale: Poor ?Standing balance comment: Less reliant on AD for stance and during gait ?  ?  ?  ?  ?  ?  ?  ?  ?  ?  ?  ?  ? ?  ?Cognition Arousal/Alertness: Awake/alert ?Behavior During Therapy: [facility] for tasks assessed/performed ?Overall Cognitive Status: Within Functional Limits for tasks assessed ?  ?  ?  ?  ?  ?  ?  ?  ?  ?  ?  ?  ?  ?  ?  ?  ?  ?  ?  ? ?  ?  Exercises   ? ?  ?General Comments General comments (skin integrity, edema, etc.): VOR x1 pt. shows slight slip when turning head to the right ?  ?  ? ?Pertinent Vitals/Pain Pain Assessment ?Pain Assessment: No/denies pain  ? ? ?Home Living   ?  ?  ?  ?  ?  ?  ?  ?  ?  ?    ?  ?Prior Function    ?  ?  ?   ? ?PT Goals (current goals can now be found in the care plan section) Acute Rehab PT Goals ?Patient Stated Goal: To improve dizziness and positional tolerance ?PT Goal Formulation: With patient/family ?Time For Goal Achievement: 11/25/21 ?Potential to Achieve Goals: Good ?Progress towards PT goals: Progressing toward goals ? ?  ?Frequency ? ? ? Min 4X/week ? ? ? ?  ?PT Plan Discharge plan needs to be updated  ? ? ?Co-evaluation   ?  ?  ?  ?  ? ?  ?AM-PAC PT "6 Clicks" Mobility   ?Outcome Measure ? Help needed turning from your back to your side while in a flat bed without using bedrails?: A Little ?Help needed moving from lying on your back to sitting on the side of a flat bed without using bedrails?: A Little ?Help needed moving to and from a bed to a chair (including a wheelchair)?: A Little ?Help needed standing up from a chair using your arms (e.g., wheelchair or bedside chair)?: A Little ?Help needed to walk in hospital room?: A Little ?Help needed climbing 3-5 steps with a railing? : A Little ?6 Click Score: 18 ? ?  ?End of Session   ?Activity Tolerance: Patient tolerated treatment well ?Patient left: in chair;with chair alarm set;with family/visitor present ?Nurse Communication: Mobility status ?PT Visit Diagnosis: Dizziness and giddiness (R42);Unsteadiness on feet (R26.81);Other symptoms and signs involving the nervous system (R29.898) ?  ? ? ?Time: 1660-6301 ?PT Time Calculation (min) (ACUTE ONLY): 24 min ? ?Charges:  $Gait Training: 8-22 mins ?$Therapeutic Activity: 8-22 mins          ?          ? ?Lorie Apley, SPT ?Acute Rehab Services ? ? ? ?Lorie Apley ?11/12/2021, 1:48 PM ? ?

## 2021-11-12 NOTE — Progress Notes (Addendum)
STROKE TEAM PROGRESS NOTE  ? ?INTERVAL HISTORY ?Her daughter and grandson is at the bedside.  Stated that patient had an acute onset of dizziness with headache and nausea at her cardiology appointment. Once arrived home, she laid down on the couch for a nap and then her husband woke her up and she was still feeling bad so her daughter called 911. Endorsed adherence to eliquis at home, confirmed by daughter. ?Resume coumadin, pharmacy consult placed ? ?Vitals:  ? 11/11/21 2343 11/12/21 [id number] 11/12/21 [id number] 11/12/21 0827  ?BP: 116/87 131/85 127/85 137/84  ?Pulse: 76 73 73 79  ?Resp: 16 18 18 18   ?Temp: 99 ?F (37.2 ?C) 99.2 ?F (37.3 ?C) 98.3 ?F (36.8 ?C) 98.2 ?F (36.8 ?C)  ?TempSrc: Oral Oral Oral Oral  ?SpO2: 97% 96% 97% 98%  ?Weight:      ?Height:      ? ?CBC:  ?Recent Labs  ?Lab 11/10/21 ?2235 11/11/21 ?[id number]  ?WBC 11.2* 10.1  ?NEUTROABS 10.3* 8.4*  ?HGB 10.9* 10.4*  ?HCT 32.2* 31.2*  ?MCV 92.0 92.0  ?PLT 321 312  ? ?Basic Metabolic Panel:  ?Recent Labs  ?Lab 11/06/21 ?[id number] 11/06/21 ?1559 11/08/21 ?[id number] 11/10/21 ?2235 11/11/21 ?[id number]  ?NA 127*   < > 133* 131* 128*  ?K 4.7   < > 4.3 4.4 4.1  ?CL 95*   < > 99 96* 93*  ?CO2 25   < > 25 27 28   ?GLUCOSE 125*   < > 111* 161* 121*  ?BUN 24*   < > 7* 9 12  ?CREATININE 1.08*   < > 0.53 0.51 0.39*  ?CALCIUM 8.6*   < > 8.8* 8.9 8.7*  ?MG 2.8*  --  1.7  --   --   ?PHOS 3.4  --   --   --   --   ? < > = values in this interval not displayed.  ? ?Lipid Panel:  ?Recent Labs  ?Lab 11/08/21 ?0206  ?CHOL 109  ?TRIG 55  ?HDL 55  ?CHOLHDL 2.0  ?VLDL 11  ?[place] 43  ? ?HgbA1c:  ?Recent Labs  ?Lab 11/08/21 ?0206  ?HGBA1C 5.4  ? ?Urine Drug Screen: No results for input(s): LABOPIA, COCAINSCRNUR, LABBENZ, AMPHETMU, THCU, LABBARB in the last 168 hours.  ?Alcohol Level No results for input(s): ETH in the last 168 hours. ? ?IMAGING past 24 hours ?No results found. ? ?PHYSICAL EXAM ? ?Physical Exam  ?Constitutional: Appears well-developed and well-nourished.  ?Cardiovascular: Normal rate and  regular rhythm.  ?Respiratory: Effort normal, non-labored breathing ? ?Neuro: ?Mental Status: ?Patient is awake, alert, oriented to person, place, month, year, and situation. ?Patient is able to give a clear and coherent history. ?No signs of aphasia or neglect ?Cranial Nerves: ?II: Visual Fields are full. Pupils are equal, round, and reactive to light.   ?III,IV, VI: EOMI without ptosis or diploplia.  ?V: Facial sensation is symmetric to temperature ?VII: Facial movement is symmetric resting and smiling ?VIII: Hearing is intact to voice ?X: Palate elevates symmetrically ?XI: Shoulder shrug is symmetric. ?XII: Tongue protrudes midline without atrophy or fasciculations.  ?Motor: ?Tone is normal. Bulk is normal. 5/5 strength was present in RUE, RLE, LLE.  ?4/5 strength in LUE with slight drift when eyes are closed  ?Sensory: ?Sensation is symmetric to light touch and temperature in the arms and legs. No extinction to DSS present.  ?Deep Tendon Reflexes: ?2+ and symmetric in the biceps and patellae.  ?Gait and Balance: ?Romberg negative ?Cerebellar: ?FNF intact bilaterally ? ? ?  ASSESSMENT/PLAN ?Hannah Downs is a 76 y.o. female with history of paroxysmal atrial fibrillation on eliquis.  ? ?She initially was on coumadin and it was held for an emergent laparoscopic reduction and repair for a hernia repair that took place on 2/23.  Her anticoagulation was held that day and then resumed on 2/24.  She had an infarct during this hospital stay and heparin was initiated. She was then started on eliquis and d/c'd home. Given eliquis failure patient wishes to resume coumadin and not continue eliquis. MRI confirms an acute right cerebellar infarct in the PICA territory.  ? 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 Small vessel disease.  ?MRI  Positive for Acute Right Cerebellar  PICA territory infarct.  ?2D Echo EF 60 to 65% no thrombus noted. ?LDL 43 ?HgbA1c 5.4 ?VTE prophylaxis - coumadin ?Eliquis (apixaban) daily prior to admission, now on warfarin daily.  ?Goal INR between 2-3 ?Therapy recommendations:  Outpatient therapy ?Disposition:  Pending ? ?Paroxysmal Atrial Fibrillation ?Failed Eliquis ?Switch back to coumadin with pharmacy to dose ?  ?Hypertension ?Home meds:  Lopressor 12.5mg  BID ?Stable ?Permissive hypertension (OK if < 220/120) but gradually normalize in 5-7 days ?Long-term BP goal normotensive ?  ?Hyperlipidemia ?Home meds: Atorvastatin 80 mg, resumed in hospital ?LDL 43, goal < 70 ?Continue statin at discharge ?Other Stroke Risk Factors ?Advanced Age >/= 84  ?Hx Stroke ?Acute cortical infarct in the left frontal parietal perirolandic region likely secondary cardio embolic secondary to discontinuation/reversal of coumadin for emergent surgery ?Discharged on Eliquis ? ? ?Hospital day # 1 ? ?Patient seen and examined by NP/APP with MD. MD to update note as needed.  ? ?Janine Ores, DNP, FNP-BC ?[facility] ?Pager: [phone] ? ?ATTENDING ATTESTATION: ? ?76 year old female past medical history of atrial fibrillation on warfarin with recent transition to Eliquis earlier this week.  Stroke was from Coumadin being stopped and reversed with vitamin K and Kcentra for incarcerated hernia repair at [place] long On 2/23.  MRI shows new CVA right cerebellar.  She has improved considerably.  She has been on Coumadin for several years without incident.  Goal INR 2-2.5 and pharmacy has been consulted to help with this transition.  It is reasonable to switch her back to Coumadin for stroke prevention.  She had a recent stroke work-up so no further testing is needed at this point.  Follow-up in stroke clinic as planned previously. ? ?Neurology will sign off. please call with questions. ? ?Dr. Reeves Forth evaluated pt independently, reviewed imaging, chart, labs. Discussed and  formulated plan with the APP. Please see APP note above for details.   Total 36 minutes spent on counseling patient and coordinating care, writing notes and reviewing chart. ? ?Tannisha Kennington,MD  ? ? ?To contact Stroke Continuity provider, please refer to [email or web address] ?After hours, contact General Neurology  ?

## 2021-11-12 NOTE — Assessment & Plan Note (Addendum)
Atorvastatin 20 mg p.o. daily ?

## 2021-11-12 NOTE — Progress Notes (Signed)
Occupational Therapy Treatment ?Patient Details ?Name: Hannah Downs ?MRN: [id number] ?DOB: 04-26-1946 ?Today's Date: 11/12/2021 ? ? ?History of present illness Pt. is a 76 y.o. female presenting to [facility] on 3/1 with complaints of dizziness. She was recently released Monday (2/27) after being admitted for an incarcerated hernia and subsequently having a small left frontoparietal infart without deficits. 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  PMH: afib, arthritis, GERD, h/o hernias with previous surgeries ?  ?OT comments ? Patient reports improvement in dizziness and is now able to ambulate in room with walker and min guard to supervision. She reports independence with toileting earlier. She demonstrates all the physical abilities needed to perform ADLs. She has 24/7 assistance at home and a shower chair. Reports she may have a rolling walker. She requires no assistance for ADLs. She has no further OT needs.  ? ?Recommendations for follow up therapy are one component of a multi-disciplinary discharge planning process, led by the attending physician.  Recommendations may be updated based on patient status, additional functional criteria and insurance authorization. ?   ?Follow Up Recommendations ? No OT follow up  ?  ?Assistance Recommended at Discharge Frequent or constant Supervision/Assistance  ?Patient can return home with the following ? A little help with walking and/or transfers;A little help with bathing/dressing/bathroom;Assistance with cooking/housework ?  ?Equipment Recommendations ? None recommended by OT  ?  ?Recommendations for Other Services   ? ?  ?Precautions / Restrictions Precautions ?Precautions: Fall ?Precaution Comments: recently abdominal surgery, permanent catheter ?Restrictions ?Weight Bearing Restrictions: No  ? ? ?  ? ?   ?Balance   ?Sitting-balance support: No upper extremity supported, Feet supported ?Sitting balance-Leahy Scale: Good ?  ?  ?Standing balance  support: Bilateral upper extremity supported ?Standing balance-Leahy Scale: Poor ?Standing balance comment: reliant on walker ?  ?  ?  ?  ?  ?  ?  ?  ?  ?  ?  ?   ? ? ? ? ?Cognition Arousal/Alertness: Awake/alert ?Behavior During Therapy: [facility] for tasks assessed/performed ?Overall Cognitive Status: Within Functional Limits for tasks assessed ?  ?  ?  ?  ?  ?  ?  ?  ?  ?  ?  ?  ?  ?  ?  ?  ?  ?  ?  ?   ?   ?   ?General Comments VOR x1 pt. shows slight slip when turning head to the right  ? ? ?Pertinent Vitals/ Pain       Pain Assessment ?Pain Assessment: No/denies pain ? ? ?Progress Toward Goals ? ?OT Goals(current goals can now be found in the care plan section) ? Progress towards OT goals: Goals met and updated - see care plan ? ?   ?Plan All goals met and education completed, patient discharged from OT services   ? ?Co-evaluation ? ? ?   ?  ?  ?  ?  ? ?  ?AM-PAC OT "6 Clicks" Daily Activity     ?Outcome Measure ? ? Help from another person eating meals?: None ?Help from another person taking care of personal grooming?: None ?Help from another person toileting, which includes using toliet, bedpan, or urinal?: None ?Help from another person bathing (including washing, rinsing, drying)?: None ?Help from another person to put on and taking off regular upper body clothing?: None ?Help from another person to put on and taking off regular lower body clothing?: None ?6 Click Score: 24 ? ?  ?End  of Session Equipment Utilized During Treatment: Rolling walker (2 wheels);Gait belt ? ?OT Visit Diagnosis: Unsteadiness on feet (R26.81);Dizziness and giddiness (R42) ?  ?Activity Tolerance Patient tolerated treatment well ?  ?Patient Left in chair;with call bell/phone within reach;with family/visitor present ?  ?Nurse Communication   ?  ? ?   ? ?Time: 1914-7829 ?OT Time Calculation (min): 16 min ? ?Charges: OT General Charges ?$OT Visit: 1 Visit ?OT Treatments ?$Self Care/Home Management : 8-22 mins ? ?Hannah Downs, OTR/L ?Acute Care  Rehab Services  ?Office [phone] ?Pager: [phone]  ? ?Hannah Downs ?11/12/2021, 11:47 AM ?

## 2021-11-12 NOTE — Progress Notes (Addendum)
ANTICOAGULATION CONSULT NOTE - Follow Up Consult ? ?Pharmacy Consult for Warfarin ?Indication: atrial fibrillation and stroke ? ?Allergies  ?Allergen Reactions  ? Lisinopril Cough  ? ? ?Patient Measurements: ?Height: 5\' 1"  (154.9 cm) ?Weight: 56.2 kg (124 lb) ?IBW/kg (Calculated) : 47.8 kg ?Heparin Dosing Weight: 52.6 kg ? ?Vital Signs: ?Temp: 98.2 ?F (36.8 ?C) (03/02 0827) ?Temp Source: Oral (03/02 0827) ?BP: 137/84 (03/02 0827) ?Pulse Rate: 79 (03/02 0827) ? ?Labs: ?Recent Labs  ?  11/10/21 ?2235 11/11/21 ?01/11/22 11/11/21 ?01/11/22  ?HGB 10.9*  --  10.4*  ?HCT 32.2*  --  31.2*  ?PLT 321  --  312  ?LABPROT 16.9*  --   --   ?INR 1.4*  --   --   ?CREATININE 0.51  --  0.39*  ?TROPONINIHS  --  32* 34*  ? ? ? ?Estimated Creatinine Clearance: 45.1 mL/min (A) (by C-G formula based on SCr of 0.39 mg/dL (L)). ? ?Assessment: ?76 yo female presented to [facility] ED on 2/22 with lower abdominal pain, nausea, and vomiting, found to have recurrent left femoral hernia incarcerated with small bowel and causing obstruction. PTA the patient is on warfarin for atrial fibrillation. INR 2.7 upon admission and the patient was given vitamin K IV 10 mg and KCentra on 2/22. The patient is s/p lysis of adhesions and reduction of recurrent hernia on 2/23.  ? ?Discharged on Eliquis and readmitted with new CVA ? ?PTA Warfarin Regimen: warfarin PO 5 mg daily prior to trying Eliquis (now stopping due to cost and new CVA).  ? ?Goal of Therapy:  ?INR 2 to 3 ?Monitor platelets by anticoagulation protocol: Yes ?  ?Plan:  ?Warfarin 5 mg po daily ?Daily INR ? ?Thank you ?3/23, PharmD ?11/12/2021 11:16 AM ? ?Please check [email or web address] for unit-specific pharmacy phone numbers. ? ? ? ?

## 2021-11-12 NOTE — Progress Notes (Signed)
Pt a/ox4 on room air. Denies discomfort/pain. No distress noted. Vitals updated in chart. NIHSS remains at 0. Family at bedside. ?[facility] arrived to room, transport in progress.  ? ?All pt belongings sent with pt family members, Lennette Bihari and Angie.  ? ? ?

## 2021-11-12 NOTE — Assessment & Plan Note (Addendum)
Metoprolol succinate 150 mg p.o. daily. Continue aspirin and statin ?

## 2021-11-12 NOTE — Progress Notes (Signed)
Inpatient Rehabilitation Admissions Coordinator  ? ?Noted outpatient therapy now recommended. We will not pursue [facility] admit at this time.We will sign off. ? ?Danne Baxter, RN, MSN ?Rehab Admissions Coordinator ?[phone] ?11/12/2021 12:49 PM ? ?

## 2021-11-12 NOTE — Assessment & Plan Note (Addendum)
Currently in normal sinus rhythm. Metoprolol succinate 150 mg p.o. daily.  Eliquis discontinued.  Restarted on Coumadin 5 mg p.o. daily, will need close follow-up with cardiology and Coumadin clinic for repeat INR next week. ? ?

## 2021-11-12 NOTE — Progress Notes (Signed)
Report given to Ricki Miller at [place]. ?Pt will go to [address].  ?Pt and pt family updated on intended transfer.  ? ?[facility] transport set up.   ?

## 2021-11-12 NOTE — TOC Progression Note (Signed)
Transition of Care (TOC) - Progression Note  ? ? ?Patient Details  ?Name: Hannah Downs ?MRN: [id number] ?Date of Birth: 04-19-1946 ? ?Transition of Care (TOC) CM/SW Contact  ?Pollie Friar, RN ?Phone Number: ?11/12/2021, 2:06 PM ? ?Clinical Narrative:    ?Recommendations have changed to Outpatient therapy. CM met with the patent and her family and she prefers home health services. CM provided choice and she had no preference. CM has arranged [place] with [facility]. Information on the AVS.  ?Pt has needed transportation and denies any issue with her home medications.  ?Pt requesting walker for home. CM has ordered through [facility] and they will deliver the DME to the room.  ?TOC following. ? ? ?Expected Discharge Plan: [place] ?Barriers to Discharge: Continued Medical Work up ? ?Expected Discharge Plan and Services ?Expected Discharge Plan: [place] ?  ?Discharge Planning Services: CM Consult ?Post Acute Care Choice: Home Health, Durable Medical Equipment ?Living arrangements for the past 2 months: [place] ?                ?DME Arranged: Walker rolling ?DME Agency: [employer] ?Date DME Agency Contacted: 11/12/21 ?  ?Representative spoke with at DME Agency: Freda Munro ?HH Arranged: PT ?[place] Agency: [place] ?Date HH Agency Contacted: 11/12/21 ?  ?Representative spoke with at [place]: Erline Levine ? ? ?Social Determinants of Health (SDOH) Interventions ?  ? ?Readmission Risk Interventions ?No flowsheet data found. ? ?

## 2021-11-12 NOTE — Progress Notes (Signed)
PROGRESS NOTE    Hannah Downs  ZOX:[id number] DOB: [birth year] DOA: 11/11/2021 PCP: [name], MD    Brief Narrative:  Hannah Downs is a 76 year old female with past medical history significant for paroxysmal atrial fibrillation on Eliquis (recently changed from Coumadin), hyperlipidemia, recently admitted for incarcerated left inguinal hernia with hospitalization complicated by atrial fibrillation with RVR and CVA who was discharged 2 days prior who presented to the ED with dizziness after visiting the cardiology office.  Following outpatient cardiology visit, patient started feeling dizzy, nausea with episode of vomiting.  Denies chest pain or shortness of breath.  Denies any weakness of extremities or no visual symptoms.  Patient initially presented to [facility], but due to wait time presented to [facility] long ED for evaluation.  In the ED, temperature 98.2 F, HR 82, RR 14, BP 144/82, SPO2 98% on room air.  WBC 10.1, hemoglobin 10.4, platelets 312.  Sodium 128, potassium 4.1, chloride 93, CO2 28, glucose 121, creatinine 0.39, BUN 12, AST 59, ALT 52, total bilirubin 1.5.  COVID-19 PCR negative.  Influenza A/B PCR negative.  CT abdomen/pelvis with no bowel perforation, s/p left inguinal hernia repair with fluid collection in the pelvis, left lower quadrant communicating through the hernia tracking into air and Hirth right thigh containing air, concerning for dehiscence versus seroma or hematoma versus superimposed abscess.  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  Allergy was consulted.  Patient was transferred to [facility] for further evaluation and management under the hospitalist service.    Assessment & Plan:   Assessment and Plan: CVA (cerebral vascular accident)  [facility]) Patient presenting to ED with dizziness associated nausea and vomiting.  MR brain without contrast with subacute infarct left frontal lobe and acute/subacute infarct left parietal lobe.  TTE 2/26 with LVEF 60 to 65%, no thrombus noted.  LDL 43, hemoglobin A1c 5.4. --Neurology following, appreciate assistance --Aspirin 81 mg p.o. daily --Atorvastatin 20 mg p.o. daily --Restarted on Coumadin --PT/OT recommending outpatient therapy, patient/family prefer home health --Await further neurology recommendations  Postoperative seroma involving digestive system after digestive system procedure Patient with recent bilateral inguinal/femoral hernia repair by Dr. Michaell Cowing on 11/05/2021. CT abdomen/pelvis on admission with no bowel perforation, s/p left inguinal hernia repair with fluid collection in the pelvis, left lower quadrant communicating through the hernia tracking into air and Hirth right thigh containing air, concerning for dehiscence versus seroma or hematoma versus superimposed abscess.  Evaluated by general surgery on 11/11/2021, etiology secondary to postoperative left groin seroma which is improving and reabsorbing, no acute intervention needed.  Outpatient follow-up with Dr. Michaell Cowing as previously arranged.  Paroxysmal atrial fibrillation (HCC) Currently in normal sinus rhythm. --Metoprolol succinate 150 mg p.o. daily --Restarted on Coumadin, pharmacy consulted for dosing/monitoring --Outpatient follow-up with cardiology, Coumadin  Essential hypertension --Metoprolol succinate 150 mg p.o. daily --Continue aspirin and statin  Hyperlipidemia --Atorvastatin 20 mg p.o. daily  Hyponatremia Sodium 128 on admission, likely secondary to hypovolemic hyponatremia in the setting of nausea/vomiting. --Na 128>133 --Encourage increased oral intake --repeat BMP in am    DVT prophylaxis: SCDs Start: 11/11/21 0605 warfarin (COUMADIN) tablet 5 mg    Code Status: Full Code Family Communication:  Updated daughter present at bedside this morning  Disposition Plan:  Level of care: Progressive Status is: Inpatient Remains inpatient appropriate because: Awaiting further neurology  recommendations, restarting Coumadin    Consultants:  Neurology General surgery - signed off 3/1  Procedures:  None  Antimicrobials:  None   Subjective: Patient seen examined at bedside, resting comfortably.  Sitting in bedside chair.  Daughter and grandson present.  No specific complaints this morning.  Daughter concerned regarding recurrent stroke.  Believes that this is a Eliquis failure and wishes to transition back to Coumadin which she was previously on.  No other specific complaints or concerns at this time.  Patient currently denies headache, no current dizziness, no chest pain, no shortness of breath, no abdominal pain, no fever/chills/night sweats, no nausea/vomiting/diarrhea, no fatigue, no paresthesias.  No acute events overnight per nursing staff.  Objective: Vitals:   11/12/21 0047 11/12/21 0337 11/12/21 0827 11/12/21 1143  BP: 131/85 127/85 137/84 132/86  Pulse: 73 73 79 70  Resp: 18 18 18 18   Temp: 99.2 F (37.3 C) 98.3 F (36.8 C) 98.2 F (36.8 C) 98.6 F (37 C)  TempSrc: Oral Oral Oral Oral  SpO2: 96% 97% 98% 98%  Weight:      Height:        Intake/Output Summary (Last 24 hours) at 11/12/2021 1604 Last data filed at 11/12/2021 0800 Gross per 24 hour  Intake 720 ml  Output 1975 ml  Net -1255 ml   Filed Weights   11/11/21 0927  Weight: 56.2 kg    Examination:  Physical Exam: GEN: NAD, alert and oriented x 3, elderly in appearance HEENT: NCAT, PERRL, EOMI, sclera clear, MMM PULM: CTAB w/o wheezes/crackles, normal respiratory effort CV: RRR w/o M/G/R GI: abd soft, NTND, NABS, no R/G/M MSK: no peripheral edema, muscle strength globally intact 5/5 bilateral upper/lower extremities NEURO: CN II-XII intact, no focal deficits, sensation to light touch intact PSYCH: normal  mood/affect Integumentary: dry/intact, no rashes or wounds    Data Reviewed: I have personally reviewed following labs and imaging studies  CBC: Recent Labs  Lab 11/07/21 0714 11/08/21 0955 11/09/21 0301 11/10/21 2235 11/11/21 0620  WBC 10.2 10.3 7.1 11.2* 10.1  NEUTROABS  --   --   --  10.3* 8.4*  HGB 10.9* 11.4* 10.0* 10.9* 10.4*  HCT 33.6* 33.6* 29.3* 32.2* 31.2*  MCV 93.3 91.6 90.2 92.0 92.0  PLT 206 272 251 321 312   Basic Metabolic Panel: Recent Labs  Lab 11/06/21 0520 11/06/21 1559 11/07/21 1558 11/08/21 0955 11/10/21 2235 11/11/21 0620 11/12/21 1045  NA 127*   < > 131* 133* 131* 128* 133*  K 4.7   < > 4.0 4.3 4.4 4.1 3.5  CL 95*   < > 97* 99 96* 93* 94*  CO2 25   < > 27 25 27 28 31   GLUCOSE 125*   < > 154* 111* 161* 121* 158*  BUN 24*   < > 9 7* 9 12 7*  CREATININE 1.08*   < > 0.53 0.53 0.51 0.39* 0.61  CALCIUM 8.6*   < > 8.6* 8.8* 8.9 8.7* 8.7*  MG 2.8*  --   --  1.7  --   --   --   PHOS 3.4  --   --   --   --   --   --    < > = values in this interval not displayed.   GFR: Estimated Creatinine Clearance: 45.1 mL/min (by C-G formula based on SCr of 0.61 mg/dL). Liver Function Tests: Recent Labs  Lab 11/06/21 0520 11/10/21 2235 11/11/21 0620 11/12/21 1045  AST 28 72* 59* 84*  ALT 17 55* 52* 77*  ALKPHOS 37* 62 57 56  BILITOT 1.9* 1.9* 1.9* 1.2  PROT 6.0* 6.0* 5.8* 5.8*  ALBUMIN 3.4* 3.3* 3.2* 3.1*   No results for input(s): LIPASE, AMYLASE in the last 168 hours. No results for input(s): AMMONIA in the last 168 hours. Coagulation Profile: Recent Labs  Lab 11/06/21 0520 11/07/21 0714 11/09/21 0301 11/10/21 2235 11/12/21 1045  INR 1.1 1.2 1.3* 1.4* 1.1   Cardiac Enzymes: No results for input(s): CKTOTAL, CKMB, CKMBINDEX, TROPONINI in the last 168 hours. BNP (last 3 results) No results for input(s): PROBNP in the last 8760 hours. HbA1C: No results for input(s): HGBA1C in the last 72 hours. CBG: No results for input(s): GLUCAP in the  last 168 hours. Lipid Profile: No results for input(s): CHOL, HDL, LDLCALC, TRIG, CHOLHDL, LDLDIRECT in the last 72 hours. Thyroid Function Tests: No results for input(s): TSH, T4TOTAL, FREET4, T3FREE, THYROIDAB in the last 72 hours. Anemia Panel: No results for input(s): VITAMINB12, FOLATE, FERRITIN, TIBC, IRON, RETICCTPCT in the last 72 hours. Sepsis Labs: No results for input(s): PROCALCITON, LATICACIDVEN in the last 168 hours.  Recent Results (from the past 240 hour(s))  Blood culture (routine x 2)     Status: None   Collection Time: 11/04/21  5:39 PM   Specimen: BLOOD  Result Value Ref Range Status   Specimen Description   Final    BLOOD LEFT ANTECUBITAL Performed at [facility], [address] [place], [place] [address]    Special Requests   Final    BOTTLES DRAWN AEROBIC AND ANAEROBIC Blood Culture adequate volume Performed at [facility], [address] [place], [place] [address]    Culture   Final    NO GROWTH 5 DAYS Performed at [facility], [address], [place], [place] [address]    Report Status 11/09/2021 FINAL  Final  Blood culture (routine x 2)     Status: None   Collection Time: 11/04/21  5:39 PM   Specimen: BLOOD  Result Value Ref Range Status   Specimen Description   Final    BLOOD Performed at [facility], [address], [place], [place] [address]    Special Requests   Final    BOTTLES DRAWN AEROBIC AND ANAEROBIC Blood Culture results may not be optimal due to an excessive volume of blood received in culture bottles Performed at [facility], [address] [place], [place] [address]    Culture   Final    NO GROWTH 5 DAYS Performed at [facility], [address] [place], [place] [address]    Report Status 11/09/2021 FINAL  Final  Urine Culture     Status: Abnormal   Collection Time: 11/04/21  6:47 PM   Specimen: Urine, Catheterized  Result Value Ref  Range Status   Specimen Description   Final    URINE, CATHETERIZED Performed at [facility], [address] [place], [place] [address]    Special Requests   Final    NONE Performed at [facility], [address] [place], [place] [address]    Culture (A)  Final    >=100,000 COLONIES/mL KLEBSIELLA SPECIES >=100,000 COLONIES/mL PSEUDOMONAS AERUGINOSA    Report Status 11/07/2021 FINAL  Final   Organism ID, Bacteria KLEBSIELLA SPECIES (A)  Final   Organism ID, Bacteria PSEUDOMONAS AERUGINOSA (A)  Final      Susceptibility   Klebsiella species -  MIC*    AMPICILLIN RESISTANT Resistant     CEFAZOLIN <=4 SENSITIVE Sensitive     CEFEPIME <=0.12 SENSITIVE Sensitive     CEFTRIAXONE <=0.25 SENSITIVE Sensitive     CIPROFLOXACIN <=0.25 SENSITIVE Sensitive     GENTAMICIN <=1 SENSITIVE Sensitive     IMIPENEM <=0.25 SENSITIVE Sensitive     NITROFURANTOIN 64 INTERMEDIATE Intermediate     TRIMETH/SULFA <=20 SENSITIVE Sensitive     AMPICILLIN/SULBACTAM 4 SENSITIVE Sensitive     PIP/TAZO <=4 SENSITIVE Sensitive     * >=100,000 COLONIES/mL KLEBSIELLA SPECIES   Pseudomonas aeruginosa - MIC*    CEFTAZIDIME 2 SENSITIVE Sensitive     CIPROFLOXACIN <=0.25 SENSITIVE Sensitive     GENTAMICIN <=1 SENSITIVE Sensitive     IMIPENEM 2 SENSITIVE Sensitive     PIP/TAZO <=4 SENSITIVE Sensitive     CEFEPIME 2 SENSITIVE Sensitive     * >=100,000 COLONIES/mL PSEUDOMONAS AERUGINOSA  Resp Panel by RT-PCR (Flu A&B, Covid)     Status: Abnormal   Collection Time: 11/04/21  6:51 PM   Specimen: Nasopharyngeal(NP) swabs in vial transport medium  Result Value Ref Range Status   SARS Coronavirus 2 by RT PCR POSITIVE (A) NEGATIVE Final    Comment: (NOTE) SARS-CoV-2 target nucleic acids are DETECTED.  The SARS-CoV-2 RNA is generally detectable in upper respiratory specimens during the acute phase of infection. Positive results are indicative of the presence of the identified  virus, but do not rule out bacterial infection or co-infection with other pathogens not detected by the test. Clinical correlation with patient history and other diagnostic information is necessary to determine patient infection status. The expected result is Negative.  Fact Sheet for Patients: BloggerCourse.com  Fact Sheet for Healthcare Providers: SeriousBroker.it  This test is not yet approved or cleared by the [place] FDA and  has been authorized for detection and/or diagnosis of SARS-CoV-2 by FDA under an Emergency Use Authorization (EUA).  This EUA will remain in effect (meaning this test can be used) for the duration of  the COVID-19 declaration under Section 564(b)(1) of the A ct, 21 U.S.C. section 360bbb-3(b)(1), unless the authorization is terminated or revoked sooner.     Influenza A by PCR NEGATIVE NEGATIVE Final   Influenza B by PCR NEGATIVE NEGATIVE Final    Comment: (NOTE) The Xpert Xpress SARS-CoV-2/FLU/RSV plus assay is intended as an aid in the diagnosis of influenza from Nasopharyngeal swab specimens and should not be used as a sole basis for treatment. Nasal washings and aspirates are unacceptable for Xpert Xpress SARS-CoV-2/FLU/RSV testing.  Fact Sheet for Patients: BloggerCourse.com  Fact Sheet for Healthcare Providers: SeriousBroker.it  This test is not yet approved or cleared by the [place] FDA and has been authorized for detection and/or diagnosis of SARS-CoV-2 by FDA under an Emergency Use Authorization (EUA). This EUA will remain in effect (meaning this test can be used) for the duration of the COVID-19 declaration under Section 564(b)(1) of the Act, 21 U.S.C. section 360bbb-3(b)(1), unless the authorization is terminated or revoked.  Performed at [facility], [address], [place], [place] [address]   Resp Panel  by RT-PCR (Flu A&B, Covid) Nasopharyngeal Swab     Status: None   Collection Time: 11/11/21  3:42 AM   Specimen: Nasopharyngeal Swab; Nasopharyngeal(NP) swabs in vial transport medium  Result Value Ref Range Status   SARS Coronavirus 2 by RT PCR NEGATIVE NEGATIVE Final    Comment: (NOTE) SARS-CoV-2 target nucleic acids are NOT DETECTED.  The SARS-CoV-2 RNA is generally detectable in upper respiratory specimens during the acute phase of infection. The lowest concentration of SARS-CoV-2 viral copies this assay can detect is 138 copies/mL. A negative result does not preclude SARS-Cov-2 infection and should not be used as the sole basis for treatment or other patient management decisions. A negative result may occur with  improper specimen collection/handling, submission of specimen other than nasopharyngeal swab, presence of viral mutation(s) within the areas targeted by this assay, and inadequate number of viral copies(<138 copies/mL). A negative result must be combined with clinical observations, patient history, and epidemiological information. The expected result is Negative.  Fact Sheet for Patients:  BloggerCourse.com  Fact Sheet for Healthcare Providers:  SeriousBroker.it  This test is no t yet approved or cleared by the [place] FDA and  has been authorized for detection and/or diagnosis of SARS-CoV-2 by FDA under an Emergency Use Authorization (EUA). This EUA will remain  in effect (meaning this test can be used) for the duration of the COVID-19 declaration under Section 564(b)(1) of the Act, 21 U.S.C.section 360bbb-3(b)(1), unless the authorization is terminated  or revoked sooner.       Influenza A by PCR NEGATIVE NEGATIVE Final   Influenza B by PCR NEGATIVE NEGATIVE Final    Comment: (NOTE) The Xpert Xpress SARS-CoV-2/FLU/RSV plus assay is intended as an aid in the diagnosis of influenza from Nasopharyngeal swab  specimens and should not be used as a sole basis for treatment. Nasal washings and aspirates are unacceptable for Xpert Xpress SARS-CoV-2/FLU/RSV testing.  Fact Sheet for Patients: BloggerCourse.com  Fact Sheet for Healthcare Providers: SeriousBroker.it  This test is not yet approved or cleared by the [place] FDA and has been authorized for detection and/or diagnosis of SARS-CoV-2 by FDA under an Emergency Use Authorization (EUA). This EUA will remain in effect (meaning this test can be used) for the duration of the COVID-19 declaration under Section 564(b)(1) of the Act, 21 U.S.C. section 360bbb-3(b)(1), unless the authorization is terminated or revoked.  Performed at [facility], [address] [place], [place] [address]          Radiology Studies: DG Chest 2 View  Result Date: 11/10/2021 CLINICAL DATA:  Dizziness. EXAM: CHEST - 2 VIEW COMPARISON:  Radiograph dated 11/07/2021. FINDINGS: The heart size and mediastinal contours are within normal limits. Both lungs are clear. The visualized skeletal structures are unremarkable. IMPRESSION: No active cardiopulmonary disease. Electronically Signed   By: Elgie Collard M.D.   On: 11/10/2021 23:02   CT Head Wo Contrast  Result Date: 11/10/2021 CLINICAL DATA:  Dizziness, persistent/recurrent, cardiac or vascular cause suspected. EXAM: CT HEAD WITHOUT CONTRAST TECHNIQUE: Contiguous axial images were obtained from the base of the skull through the vertex without intravenous contrast. RADIATION DOSE REDUCTION: This exam was performed according to the departmental dose-optimization program which includes automated exposure control, adjustment of the mA and/or kV according to patient size and/or use of iterative reconstruction technique. COMPARISON:  11/07/2021. FINDINGS: Brain: No acute intracranial hemorrhage, midline shift or mass effect. No extra-axial fluid  collection. Mild atrophy is noted. No hydrocephalus. Vascular: No hyperdense vessel or unexpected calcification. Skull: Normal. Negative for fracture or focal lesion. Sinuses/Orbits: No acute finding. Other: None. IMPRESSION: No acute intracranial process. Electronically Signed   By: Thornell Sartorius M.D.   On: 11/10/2021 23:19   MR BRAIN WO CONTRAST  Result Date: 11/11/2021 CLINICAL DATA:  76 year old female with dizziness. Recent small posterior left MCA, perirolandic infarct on  11/07/2021. EXAM: MRI HEAD WITHOUT CONTRAST TECHNIQUE: Multiplanar, multiecho pulse sequences of the brain and surrounding structures were obtained without intravenous contrast. COMPARISON:  Brain MRI and CTA head and neck 11/07/2021. FINDINGS: Brain: Confluent new right cerebellar PICA territory restricted diffusion in an area of about 4 cm (series 5, image 54). Diffusion abnormality tracks into the right cerebellar vermis on image 57. T2 and FLAIR hyperintense cytotoxic edema with no hemorrhage or mass effect. No other abnormal posterior fossa diffusion. Patchy left MCA territory perirolandic infarct is stable. Mild cortical cytotoxic edema there with no malignant hemorrhagic transformation. Nearby petechial hemorrhage versus underlying chronic microhemorrhage on series 9, image 33 is stable. No right hemisphere or deep gray nuclei restricted diffusion. Stable gray and white matter signal elsewhere. No other chronic cerebral blood products. No midline shift, mass effect, evidence of mass lesion, ventriculomegaly, extra-axial collection or acute intracranial hemorrhage. Cervicomedullary junction and pituitary are within normal limits. Vascular: Major intracranial vascular flow voids are stable, including dominant appearance of the distal right vertebral artery. Skull and upper cervical spine: Stable visible cervical spine disc and endplate degeneration. Visualized bone marrow signal is within normal limits. Sinuses/Orbits: Stable,  negative. Other: Mastoids remain clear. Visible internal auditory structures appear normal. IMPRESSION: 1. Positive for Acute Right Cerebellar PICA territory infarct. No associated hemorrhage or mass effect. 2. 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 No malignant hemorrhagic transformation or mass effect. Electronically Signed   By: Odessa Fleming M.D.   On: 11/11/2021 07:16   CT ABDOMEN PELVIS W CONTRAST  Result Date: 11/11/2021 CLINICAL DATA:  Upset stomach, bowel obstruction suspected. Status post emergency left inguinal hernia repair. EXAM: CT ABDOMEN AND PELVIS WITH CONTRAST TECHNIQUE: Multidetector CT imaging of the abdomen and pelvis was performed using the standard protocol following bolus administration of intravenous contrast. RADIATION DOSE REDUCTION: This exam was performed according to the departmental dose-optimization program which includes automated exposure control, adjustment of the mA and/or kV according to patient size and/or use of iterative reconstruction technique. CONTRAST:  OMNIPAQUE IOHEXOL 300 MG/ML  SOLN COMPARISON:  11/04/2021. FINDINGS: Lower chest: Atelectasis is present at the lung bases. Hepatobiliary: No focal liver abnormality is seen. No gallstones, gallbladder wall thickening, or biliary dilatation. Pancreas: Pancreatic atrophy is noted. No pancreatic ductal dilatation or surrounding inflammatory changes. Spleen: Normal in size without focal abnormality. Adrenals/Urinary Tract: No adrenal nodule or mass. The kidneys enhance symmetrically. Subcentimeter hypodensities are present in the lower pole the kidneys which are too small to further characterize. No renal calculus or hydronephrosis. A Foley catheter is present in the urinary bladder and the bladder is decompressed. Stomach/Bowel: There is a small hiatal hernia. The stomach is otherwise unchanged. A large duodenal diverticulum is noted. No bowel obstruction is identified. No  focal bowel wall thickening. The appendix is not definitely seen on exam. Vascular/Lymphatic: No significant vascular findings are present. No enlarged abdominal or pelvic lymph nodes. 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 Hardware artifact limits evaluation. Other: A small amount of ascites is noted in abdomen and pelvis. There is a fluid collection in the left lower quadrant containing foci of air which is continuous with the left inguinal hernia and extends into the anterior thigh on the left. The portion of the collection in the pelvis on the left measures 7.5 x 3.4 cm. The collection extending through the left inguinal hernia and anterior left thigh measures 10.0 x 3.3 cm and is partially  excluded from the field of view. A few foci of air is seen in the collections which is likely postsurgical. Hyperdense material is also noted in the collection in the left thigh, possibly representing blood products. 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 Degenerative changes are present in the thoracolumbar spine. No acute osseous abnormality is identified. IMPRESSION: 1. No bowel obstruction. 2. Status post left inguinal hernia repair. There is a fluid collection in the pelvis in the left lower quadrant communicating through the hernia tract and into the anterior right thigh containing air, concerning for dehiscence. Findings may represent seroma or hematoma. Clinical correlation is recommended to exclude superimposed abscess. Surgical consultation is recommended. 3. Remaining chronic findings are unchanged. Critical findings were reported to Dr. Blinda Leatherwood at 3:59 a.m. Electronically Signed   By: Thornell Sartorius M.D.   On: 11/11/2021 04:02        Scheduled Meds:  aspirin EC  81 mg Oral Daily   atorvastatin  20 mg Oral Daily   Chlorhexidine Gluconate Cloth  6 each Topical Daily   gabapentin  100 mg Oral BID   metoprolol succinate   150 mg Oral Daily   warfarin  5 mg Oral q1600   Warfarin - Physician Dosing Inpatient   Does not apply q1600   Continuous Infusions:   LOS: 1 day    Time spent: 49 minutes spent on chart review, discussion with nursing staff, consultants, updating family and interview/physical exam; more than 50% of that time was spent in counseling and/or coordination of care.    Alvira Philips [place], DO [facility] Available via Epic secure chat 7am-7pm After these hours, please refer to coverage provider listed on [email or web address] 11/12/2021, 4:04 PM

## 2021-11-13 ENCOUNTER — Telehealth: Payer: Self-pay | Admitting: Cardiovascular Disease

## 2021-11-13 DIAGNOSIS — I480 Paroxysmal atrial fibrillation: Secondary | ICD-10-CM | POA: Diagnosis not present

## 2021-11-13 DIAGNOSIS — I10 Essential (primary) hypertension: Secondary | ICD-10-CM | POA: Diagnosis not present

## 2021-11-13 DIAGNOSIS — K91872 Postprocedural seroma of a digestive system organ or structure following a digestive system procedure: Secondary | ICD-10-CM | POA: Diagnosis not present

## 2021-11-13 DIAGNOSIS — I639 Cerebral infarction, unspecified: Secondary | ICD-10-CM | POA: Diagnosis not present

## 2021-11-13 LAB — PROTIME-INR
INR: 1.1 (ref 0.8–1.2)
Prothrombin Time: 14.6 seconds (ref 11.4–15.2)

## 2021-11-13 LAB — BASIC METABOLIC PANEL
Anion gap: 8 (ref 5–15)
BUN: 5 mg/dL — ABNORMAL LOW (ref 8–23)
CO2: 30 mmol/L (ref 22–32)
Calcium: 8.9 mg/dL (ref 8.9–10.3)
Chloride: 97 mmol/L — ABNORMAL LOW (ref 98–111)
Creatinine, Ser: 0.45 mg/dL (ref 0.44–1.00)
GFR, Estimated: >60 mL/min (ref >60)
Glucose, Bld: 91 mg/dL (ref 70–99)
Potassium: 3.6 mmol/L (ref 3.5–5.1)
Sodium: 135 mmol/L (ref 135–145)

## 2021-11-13 MED ORDER — WARFARIN - PHARMACIST DOSING INPATIENT: Freq: Every day | Status: DC

## 2021-11-13 MED ORDER — ASPIRIN 81 MG PO TBEC: 81.0000 mg | DELAYED_RELEASE_TABLET | Freq: Every day | ORAL | 3 refills | Status: AC

## 2021-11-13 MED ORDER — WARFARIN SODIUM 5 MG PO TABS: 5.0000 mg | ORAL_TABLET | Freq: Every day | ORAL | Status: DC

## 2021-11-13 NOTE — Discharge Instructions (Signed)
Information on my medicine - Coumadin?   (Warfarin) ? ?Why was Coumadin prescribed for you? ?Coumadin was prescribed for you because you have a blood clot or a medical condition that can cause an increased risk of forming blood clots. Blood clots can cause serious health problems by blocking the flow of blood to the heart, lung, or brain. Coumadin can prevent harmful blood clots from forming. ?As a reminder your indication for Coumadin is:  Stroke Prevention because of Atrial Fibrillation ? ?What test will check on my response to Coumadin? ?While on Coumadin (warfarin) you will need to have an INR test regularly to ensure that your dose is keeping you in the desired range. The INR (international normalized ratio) number is calculated from the result of the laboratory test called prothrombin time (PT). ? ?If an INR APPOINTMENT HAS NOT ALREADY BEEN MADE FOR YOU please schedule an appointment to have this lab work done by your health care provider within 7 days. ?Your INR goal is usually a number between:  2 to 3 or your provider may give you a more narrow range like 2-2.5.  Ask your health care provider during an office visit what your goal INR is. ? ?What  do you need to  know  About  COUMADIN? ?Take Coumadin (warfarin) exactly as prescribed by your healthcare provider about the same time each day.  DO NOT stop taking without talking to the doctor who prescribed the medication.  Stopping without other blood clot prevention medication to take the place of Coumadin may increase your risk of developing a new clot or stroke.  Get refills before you run out. ? ?What do you do if you miss a dose? ?If you miss a dose, take it as soon as you remember on the same day then continue your regularly scheduled regimen the next day.  Do not take two doses of Coumadin at the same time. ? ?Important Safety Information ?A possible side effect of Coumadin (Warfarin) is an increased risk of bleeding. You should call your healthcare  provider right away if you experience any of the following: ?Bleeding from an injury or your nose that does not stop. ?Unusual colored urine (red or dark brown) or unusual colored stools (red or black). ?Unusual bruising for unknown reasons. ?A serious fall or if you hit your head (even if there is no bleeding). ? ?Some foods or medicines interact with Coumadin? (warfarin) and might alter your response to warfarin. To help avoid this: ?Eat a balanced diet, maintaining a consistent amount of Vitamin K. ?Notify your provider about major diet changes you plan to make. ?Avoid alcohol or limit your intake to 1 drink for women and 2 drinks for men per day. ?(1 drink is 5 oz. wine, 12 oz. beer, or 1.5 oz. liquor.) ? ?Make sure that ANY health care provider who prescribes medication for you knows that you are taking Coumadin (warfarin).  Also make sure the healthcare provider who is monitoring your Coumadin knows when you have started a new medication including herbals and non-prescription products. ? ?Coumadin? (Warfarin)  Major Drug Interactions  ?Increased Warfarin Effect Decreased Warfarin Effect  ?Alcohol (large quantities) ?Antibiotics (esp. Septra/Bactrim, Flagyl, Cipro) ?Amiodarone (Cordarone) ?Aspirin (ASA) ?Cimetidine (Tagamet) ?Megestrol (Megace) ?NSAIDs (ibuprofen, naproxen, etc.) ?Piroxicam (Feldene) ?Propafenone (Rythmol SR) ?Propranolol (Inderal) ?Isoniazid (INH) ?Posaconazole (Noxafil) Barbiturates (Phenobarbital) ?Carbamazepine (Tegretol) ?Chlordiazepoxide (Librium) ?Cholestyramine (Questran) ?Griseofulvin ?Oral Contraceptives ?Rifampin ?Sucralfate (Carafate) ?Vitamin K  ? ?Coumadin? (Warfarin) Major Herbal Interactions  ?Increased Warfarin Effect Decreased Warfarin Effect  ?  Garlic ?Ginseng ?Ginkgo biloba Coenzyme Q10 ?Green tea ?St. John?s wort   ? ?Coumadin? (Warfarin) FOOD Interactions  ?Eat a consistent number of servings per week of foods HIGH in Vitamin K ?(1 serving = ? cup)  ?Collards (cooked, or  boiled & drained) ?Kale (cooked, or boiled & drained) ?Mustard greens (cooked, or boiled & drained) ?Parsley *serving size only = ? cup ?Spinach (cooked, or boiled & drained) ?Swiss chard (cooked, or boiled & drained) ?Turnip greens (cooked, or boiled & drained)  ?Eat a consistent number of servings per week of foods MEDIUM-HIGH in Vitamin K ?(1 serving = 1 cup)  ?Asparagus (cooked, or boiled & drained) ?Broccoli (cooked, boiled & drained, or raw & chopped) ?Brussel sprouts (cooked, or boiled & drained) *serving size only = ? cup ?Lettuce, raw (green leaf, endive, romaine) ?Spinach, raw ?Turnip greens, raw & chopped  ? ?These websites have more information on Coumadin (warfarin):  www.coumadin.com; ?www.ahrq.gov/consumer/coumadin.htm; ? ? ?

## 2021-11-13 NOTE — Care Management Important Message (Signed)
Important Message ? ?Patient Details  ?Name: Hannah Downs ?MRN: [id number] ?Date of Birth: 05/31/1946 ? ? ?Medicare Important Message Given:  Yes ? ?Patient left prior to IM delivery  ?Will mail to the patient home address.  ? ? ? ?Colonel Krauser ?11/13/2021, 2:07 PM ?

## 2021-11-13 NOTE — Discharge Summary (Signed)
Physician Discharge Summary  Hannah Downs [id number] DOB: 31-Oct-1945 DOA: 11/11/2021  PCP: Harlan Stains, MD  Admit date: 11/11/2021 Discharge date: 11/13/2021  Admitted From: Home Disposition: Home  Recommendations for Outpatient Follow-up:  Follow up with PCP in 1-2 weeks Follow-up with cardiology as scheduled, Coumadin clinic next week for repeat INR Follow-up with neurology outpatient as previously scheduled Follow-up with general surgery, Dr. Johney Maine as previously scheduled Discontinued Eliquis, restarted on Coumadin which she previously was on Please obtain BMP and INR in 1 week  Home Health: RN/PT/OT Equipment/Devices: Walker  Discharge Condition: Stable CODE STATUS: Full code Diet recommendation: Heart healthy diet  History of present illness:  Hannah Downs is a 76 year old female with past medical history significant for paroxysmal atrial fibrillation on Eliquis (recently changed from Coumadin), hyperlipidemia, recently admitted for incarcerated left inguinal hernia with hospitalization complicated by atrial fibrillation with RVR and CVA who was discharged 2 days prior who presented to the ED with dizziness after visiting the cardiology office.  Following outpatient cardiology visit, patient started feeling dizzy, nausea with episode of vomiting.  Denies chest pain or shortness of breath.  Denies any weakness of extremities or no visual symptoms.  Patient initially presented to [facility], but due to wait time presented to [facility] ED for evaluation.  In the ED, temperature 98.2 F, HR 82, RR 14, BP 144/82, SPO2 98% on room air.  WBC 10.1, hemoglobin 10.4, platelets 312.  Sodium 128, potassium 4.1, chloride 93, CO2 28, glucose 121, creatinine 0.39, BUN 12, AST 59, ALT 52, total bilirubin 1.5.  COVID-19 PCR negative.  Influenza A/B PCR negative.  CT abdomen/pelvis with no bowel perforation, s/p left inguinal hernia repair with fluid collection in the  pelvis, left lower quadrant communicating through the hernia tracking into air and Hirth right thigh containing air, concerning for dehiscence versus seroma or hematoma versus superimposed abscess.  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  Allergy was consulted.  Patient was transferred to [facility] for further evaluation and management under the hospitalist service.  Hospital course:  Assessment and Plan: CVA (cerebral vascular accident) [facility]) Patient presenting to ED with dizziness associated nausea and vomiting.  MR brain without contrast with subacute infarct left frontal lobe and acute/subacute infarct left parietal lobe.  TTE 2/26 with LVEF 60 to 65%, no thrombus noted.  LDL 43, hemoglobin A1c 5.4.  Neurology was consulted and followed during hospital course.  Started on aspirin 81 mg p.o. daily.  Continued on atorvastatin 20 mg p.o. daily.  Eliquis was discontinued and patient was restarted on Coumadin 5 mg p.o. daily.  Seen by physical and Occupational Therapy with recommendations of home health on discharge.  Will need close follow-up with cardiology for repeat INR next week.  Patient also to follow-up with neurology as previously scheduled.  Postoperative seroma involving digestive system after digestive system procedure Patient with recent bilateral inguinal/femoral hernia repair by Dr. Johney Maine on 11/05/2021. CT abdomen/pelvis on admission with no bowel perforation, s/p left inguinal hernia repair with fluid collection in the pelvis, left lower quadrant communicating through the hernia tracking into air and Hirth right thigh containing air, concerning for dehiscence versus seroma or hematoma versus superimposed abscess.  Evaluated by general surgery on 11/11/2021, etiology secondary to postoperative left  groin seroma which is improving and reabsorbing, no acute intervention  Physician Discharge Summary  Hannah Downs [id number] DOB: 31-Oct-1945 DOA: 11/11/2021  PCP: Harlan Stains, MD  Admit date: 11/11/2021 Discharge date: 11/13/2021  Admitted From: Home Disposition: Home  Recommendations for Outpatient Follow-up:  Follow up with PCP in 1-2 weeks Follow-up with cardiology as scheduled, Coumadin clinic next week for repeat INR Follow-up with neurology outpatient as previously scheduled Follow-up with general surgery, Dr. Johney Maine as previously scheduled Discontinued Eliquis, restarted on Coumadin which she previously was on Please obtain BMP and INR in 1 week  Home Health: RN/PT/OT Equipment/Devices: Walker  Discharge Condition: Stable CODE STATUS: Full code Diet recommendation: Heart healthy diet  History of present illness:  Hannah Downs is a 76 year old female with past medical history significant for paroxysmal atrial fibrillation on Eliquis (recently changed from Coumadin), hyperlipidemia, recently admitted for incarcerated left inguinal hernia with hospitalization complicated by atrial fibrillation with RVR and CVA who was discharged 2 days prior who presented to the ED with dizziness after visiting the cardiology office.  Following outpatient cardiology visit, patient started feeling dizzy, nausea with episode of vomiting.  Denies chest pain or shortness of breath.  Denies any weakness of extremities or no visual symptoms.  Patient initially presented to [facility], but due to wait time presented to [facility] ED for evaluation.  In the ED, temperature 98.2 F, HR 82, RR 14, BP 144/82, SPO2 98% on room air.  WBC 10.1, hemoglobin 10.4, platelets 312.  Sodium 128, potassium 4.1, chloride 93, CO2 28, glucose 121, creatinine 0.39, BUN 12, AST 59, ALT 52, total bilirubin 1.5.  COVID-19 PCR negative.  Influenza A/B PCR negative.  CT abdomen/pelvis with no bowel perforation, s/p left inguinal hernia repair with fluid collection in the  pelvis, left lower quadrant communicating through the hernia tracking into air and Hirth right thigh containing air, concerning for dehiscence versus seroma or hematoma versus superimposed abscess.  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  Allergy was consulted.  Patient was transferred to [facility] for further evaluation and management under the hospitalist service.  Hospital course:  Assessment and Plan: CVA (cerebral vascular accident) [facility]) Patient presenting to ED with dizziness associated nausea and vomiting.  MR brain without contrast with subacute infarct left frontal lobe and acute/subacute infarct left parietal lobe.  TTE 2/26 with LVEF 60 to 65%, no thrombus noted.  LDL 43, hemoglobin A1c 5.4.  Neurology was consulted and followed during hospital course.  Started on aspirin 81 mg p.o. daily.  Continued on atorvastatin 20 mg p.o. daily.  Eliquis was discontinued and patient was restarted on Coumadin 5 mg p.o. daily.  Seen by physical and Occupational Therapy with recommendations of home health on discharge.  Will need close follow-up with cardiology for repeat INR next week.  Patient also to follow-up with neurology as previously scheduled.  Postoperative seroma involving digestive system after digestive system procedure Patient with recent bilateral inguinal/femoral hernia repair by Dr. Johney Maine on 11/05/2021. CT abdomen/pelvis on admission with no bowel perforation, s/p left inguinal hernia repair with fluid collection in the pelvis, left lower quadrant communicating through the hernia tracking into air and Hirth right thigh containing air, concerning for dehiscence versus seroma or hematoma versus superimposed abscess.  Evaluated by general surgery on 11/11/2021, etiology secondary to postoperative left  groin seroma which is improving and reabsorbing, no acute intervention  is faintly visualized in the right supraclavicular region. The left chest wall soft tissue gas is not definitively seen. 2. Low lung volumes with bibasilar atelectasis. Electronically Signed   By: Keith Rake M.D.   On: 11/07/2021 23:45   ECHOCARDIOGRAM COMPLETE  Result Date: 11/08/2021    ECHOCARDIOGRAM REPORT   Patient Name:   Hannah Downs Date of Exam: 11/08/2021 Medical Rec #:  [id number]           Height:       61.0 in Accession #:    [id number]          Weight:       116.0 lb Date of Birth:  [birth year]           BSA:          1.498 m Patient Age:    76 years            BP:           154/101 mmHg Patient Gender: F                   HR:           80 bpm. Exam Location:  Inpatient Procedure: 2D Echo, Cardiac Doppler and Color Doppler Indications:    CVA  History:        Patient has prior history of Echocardiogram examinations, most                 recent 04/01/2020. Risk Factors:Hypertension and Dyslipidemia.  Sonographer:    Luisa Hart RDCS Referring Phys: [phone] A CALDWELL POWELL JR  Sonographer Comments: No parasternal window, suboptimal apical window and suboptimal subcostal window. Image acquisition challenging due to patient body habitus. IMPRESSIONS  1. Technically difficult; no parasternal views.  2. Left ventricular ejection fraction, by estimation, is 60 to 65%. The left ventricle has normal function. The left ventricle has no regional wall motion abnormalities. Left ventricular diastolic parameters are consistent with Grade II diastolic dysfunction (pseudonormalization).  3. Right ventricular systolic function is normal. The right ventricular size is normal.  4. Left atrial size was mildly dilated.  5. Right atrial size was mildly dilated.  6. The mitral valve is normal in structure. Trivial mitral valve regurgitation. No evidence of mitral stenosis.  7. The aortic valve was not assessed. Aortic valve  regurgitation is not visualized. No aortic stenosis is present.  8. Pulmonic valve regurgitation Not assessed.  9. The inferior vena cava is normal in size with greater than 50% respiratory variability, suggesting right atrial pressure of 3 mmHg. FINDINGS  Left Ventricle: Left ventricular ejection fraction, by estimation, is 60 to 65%. The left ventricle has normal function. The left ventricle has no regional wall motion abnormalities. The left ventricular internal cavity size was normal in size. There is  no left ventricular hypertrophy. Left ventricular diastolic parameters are consistent with Grade II diastolic dysfunction (pseudonormalization). Right Ventricle: The right ventricular size is normal. Right ventricular systolic function is normal. Left Atrium: Left atrial size was mildly dilated. Right Atrium: Right atrial size was mildly dilated. Pericardium: There is no evidence of pericardial effusion. Mitral Valve: The mitral valve is normal in structure. Trivial mitral valve regurgitation. No evidence of mitral valve stenosis. MV peak gradient, 2.4 mmHg. The mean mitral valve gradient is 1.0 mmHg. Tricuspid Valve: The tricuspid valve is normal in structure. Tricuspid valve regurgitation is not demonstrated. No evidence of tricuspid stenosis. Aortic Valve: The aortic valve was not assessed. Aortic  Physician Discharge Summary  Hannah Downs [id number] DOB: 31-Oct-1945 DOA: 11/11/2021  PCP: Harlan Stains, MD  Admit date: 11/11/2021 Discharge date: 11/13/2021  Admitted From: Home Disposition: Home  Recommendations for Outpatient Follow-up:  Follow up with PCP in 1-2 weeks Follow-up with cardiology as scheduled, Coumadin clinic next week for repeat INR Follow-up with neurology outpatient as previously scheduled Follow-up with general surgery, Dr. Johney Maine as previously scheduled Discontinued Eliquis, restarted on Coumadin which she previously was on Please obtain BMP and INR in 1 week  Home Health: RN/PT/OT Equipment/Devices: Walker  Discharge Condition: Stable CODE STATUS: Full code Diet recommendation: Heart healthy diet  History of present illness:  Hannah Downs is a 76 year old female with past medical history significant for paroxysmal atrial fibrillation on Eliquis (recently changed from Coumadin), hyperlipidemia, recently admitted for incarcerated left inguinal hernia with hospitalization complicated by atrial fibrillation with RVR and CVA who was discharged 2 days prior who presented to the ED with dizziness after visiting the cardiology office.  Following outpatient cardiology visit, patient started feeling dizzy, nausea with episode of vomiting.  Denies chest pain or shortness of breath.  Denies any weakness of extremities or no visual symptoms.  Patient initially presented to [facility], but due to wait time presented to [facility] ED for evaluation.  In the ED, temperature 98.2 F, HR 82, RR 14, BP 144/82, SPO2 98% on room air.  WBC 10.1, hemoglobin 10.4, platelets 312.  Sodium 128, potassium 4.1, chloride 93, CO2 28, glucose 121, creatinine 0.39, BUN 12, AST 59, ALT 52, total bilirubin 1.5.  COVID-19 PCR negative.  Influenza A/B PCR negative.  CT abdomen/pelvis with no bowel perforation, s/p left inguinal hernia repair with fluid collection in the  pelvis, left lower quadrant communicating through the hernia tracking into air and Hirth right thigh containing air, concerning for dehiscence versus seroma or hematoma versus superimposed abscess.  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  Allergy was consulted.  Patient was transferred to [facility] for further evaluation and management under the hospitalist service.  Hospital course:  Assessment and Plan: CVA (cerebral vascular accident) [facility]) Patient presenting to ED with dizziness associated nausea and vomiting.  MR brain without contrast with subacute infarct left frontal lobe and acute/subacute infarct left parietal lobe.  TTE 2/26 with LVEF 60 to 65%, no thrombus noted.  LDL 43, hemoglobin A1c 5.4.  Neurology was consulted and followed during hospital course.  Started on aspirin 81 mg p.o. daily.  Continued on atorvastatin 20 mg p.o. daily.  Eliquis was discontinued and patient was restarted on Coumadin 5 mg p.o. daily.  Seen by physical and Occupational Therapy with recommendations of home health on discharge.  Will need close follow-up with cardiology for repeat INR next week.  Patient also to follow-up with neurology as previously scheduled.  Postoperative seroma involving digestive system after digestive system procedure Patient with recent bilateral inguinal/femoral hernia repair by Dr. Johney Maine on 11/05/2021. CT abdomen/pelvis on admission with no bowel perforation, s/p left inguinal hernia repair with fluid collection in the pelvis, left lower quadrant communicating through the hernia tracking into air and Hirth right thigh containing air, concerning for dehiscence versus seroma or hematoma versus superimposed abscess.  Evaluated by general surgery on 11/11/2021, etiology secondary to postoperative left  groin seroma which is improving and reabsorbing, no acute intervention  Physician Discharge Summary  Hannah Downs [id number] DOB: 31-Oct-1945 DOA: 11/11/2021  PCP: Harlan Stains, MD  Admit date: 11/11/2021 Discharge date: 11/13/2021  Admitted From: Home Disposition: Home  Recommendations for Outpatient Follow-up:  Follow up with PCP in 1-2 weeks Follow-up with cardiology as scheduled, Coumadin clinic next week for repeat INR Follow-up with neurology outpatient as previously scheduled Follow-up with general surgery, Dr. Johney Maine as previously scheduled Discontinued Eliquis, restarted on Coumadin which she previously was on Please obtain BMP and INR in 1 week  Home Health: RN/PT/OT Equipment/Devices: Walker  Discharge Condition: Stable CODE STATUS: Full code Diet recommendation: Heart healthy diet  History of present illness:  Hannah Downs is a 76 year old female with past medical history significant for paroxysmal atrial fibrillation on Eliquis (recently changed from Coumadin), hyperlipidemia, recently admitted for incarcerated left inguinal hernia with hospitalization complicated by atrial fibrillation with RVR and CVA who was discharged 2 days prior who presented to the ED with dizziness after visiting the cardiology office.  Following outpatient cardiology visit, patient started feeling dizzy, nausea with episode of vomiting.  Denies chest pain or shortness of breath.  Denies any weakness of extremities or no visual symptoms.  Patient initially presented to [facility], but due to wait time presented to [facility] ED for evaluation.  In the ED, temperature 98.2 F, HR 82, RR 14, BP 144/82, SPO2 98% on room air.  WBC 10.1, hemoglobin 10.4, platelets 312.  Sodium 128, potassium 4.1, chloride 93, CO2 28, glucose 121, creatinine 0.39, BUN 12, AST 59, ALT 52, total bilirubin 1.5.  COVID-19 PCR negative.  Influenza A/B PCR negative.  CT abdomen/pelvis with no bowel perforation, s/p left inguinal hernia repair with fluid collection in the  pelvis, left lower quadrant communicating through the hernia tracking into air and Hirth right thigh containing air, concerning for dehiscence versus seroma or hematoma versus superimposed abscess.  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  Allergy was consulted.  Patient was transferred to [facility] for further evaluation and management under the hospitalist service.  Hospital course:  Assessment and Plan: CVA (cerebral vascular accident) [facility]) Patient presenting to ED with dizziness associated nausea and vomiting.  MR brain without contrast with subacute infarct left frontal lobe and acute/subacute infarct left parietal lobe.  TTE 2/26 with LVEF 60 to 65%, no thrombus noted.  LDL 43, hemoglobin A1c 5.4.  Neurology was consulted and followed during hospital course.  Started on aspirin 81 mg p.o. daily.  Continued on atorvastatin 20 mg p.o. daily.  Eliquis was discontinued and patient was restarted on Coumadin 5 mg p.o. daily.  Seen by physical and Occupational Therapy with recommendations of home health on discharge.  Will need close follow-up with cardiology for repeat INR next week.  Patient also to follow-up with neurology as previously scheduled.  Postoperative seroma involving digestive system after digestive system procedure Patient with recent bilateral inguinal/femoral hernia repair by Dr. Johney Maine on 11/05/2021. CT abdomen/pelvis on admission with no bowel perforation, s/p left inguinal hernia repair with fluid collection in the pelvis, left lower quadrant communicating through the hernia tracking into air and Hirth right thigh containing air, concerning for dehiscence versus seroma or hematoma versus superimposed abscess.  Evaluated by general surgery on 11/11/2021, etiology secondary to postoperative left  groin seroma which is improving and reabsorbing, no acute intervention  Physician Discharge Summary  Hannah Downs [id number] DOB: 31-Oct-1945 DOA: 11/11/2021  PCP: Harlan Stains, MD  Admit date: 11/11/2021 Discharge date: 11/13/2021  Admitted From: Home Disposition: Home  Recommendations for Outpatient Follow-up:  Follow up with PCP in 1-2 weeks Follow-up with cardiology as scheduled, Coumadin clinic next week for repeat INR Follow-up with neurology outpatient as previously scheduled Follow-up with general surgery, Dr. Johney Maine as previously scheduled Discontinued Eliquis, restarted on Coumadin which she previously was on Please obtain BMP and INR in 1 week  Home Health: RN/PT/OT Equipment/Devices: Walker  Discharge Condition: Stable CODE STATUS: Full code Diet recommendation: Heart healthy diet  History of present illness:  Hannah Downs is a 76 year old female with past medical history significant for paroxysmal atrial fibrillation on Eliquis (recently changed from Coumadin), hyperlipidemia, recently admitted for incarcerated left inguinal hernia with hospitalization complicated by atrial fibrillation with RVR and CVA who was discharged 2 days prior who presented to the ED with dizziness after visiting the cardiology office.  Following outpatient cardiology visit, patient started feeling dizzy, nausea with episode of vomiting.  Denies chest pain or shortness of breath.  Denies any weakness of extremities or no visual symptoms.  Patient initially presented to [facility], but due to wait time presented to [facility] ED for evaluation.  In the ED, temperature 98.2 F, HR 82, RR 14, BP 144/82, SPO2 98% on room air.  WBC 10.1, hemoglobin 10.4, platelets 312.  Sodium 128, potassium 4.1, chloride 93, CO2 28, glucose 121, creatinine 0.39, BUN 12, AST 59, ALT 52, total bilirubin 1.5.  COVID-19 PCR negative.  Influenza A/B PCR negative.  CT abdomen/pelvis with no bowel perforation, s/p left inguinal hernia repair with fluid collection in the  pelvis, left lower quadrant communicating through the hernia tracking into air and Hirth right thigh containing air, concerning for dehiscence versus seroma or hematoma versus superimposed abscess.  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  Allergy was consulted.  Patient was transferred to [facility] for further evaluation and management under the hospitalist service.  Hospital course:  Assessment and Plan: CVA (cerebral vascular accident) [facility]) Patient presenting to ED with dizziness associated nausea and vomiting.  MR brain without contrast with subacute infarct left frontal lobe and acute/subacute infarct left parietal lobe.  TTE 2/26 with LVEF 60 to 65%, no thrombus noted.  LDL 43, hemoglobin A1c 5.4.  Neurology was consulted and followed during hospital course.  Started on aspirin 81 mg p.o. daily.  Continued on atorvastatin 20 mg p.o. daily.  Eliquis was discontinued and patient was restarted on Coumadin 5 mg p.o. daily.  Seen by physical and Occupational Therapy with recommendations of home health on discharge.  Will need close follow-up with cardiology for repeat INR next week.  Patient also to follow-up with neurology as previously scheduled.  Postoperative seroma involving digestive system after digestive system procedure Patient with recent bilateral inguinal/femoral hernia repair by Dr. Johney Maine on 11/05/2021. CT abdomen/pelvis on admission with no bowel perforation, s/p left inguinal hernia repair with fluid collection in the pelvis, left lower quadrant communicating through the hernia tracking into air and Hirth right thigh containing air, concerning for dehiscence versus seroma or hematoma versus superimposed abscess.  Evaluated by general surgery on 11/11/2021, etiology secondary to postoperative left  groin seroma which is improving and reabsorbing, no acute intervention  Physician Discharge Summary  Hannah Downs [id number] DOB: 31-Oct-1945 DOA: 11/11/2021  PCP: Harlan Stains, MD  Admit date: 11/11/2021 Discharge date: 11/13/2021  Admitted From: Home Disposition: Home  Recommendations for Outpatient Follow-up:  Follow up with PCP in 1-2 weeks Follow-up with cardiology as scheduled, Coumadin clinic next week for repeat INR Follow-up with neurology outpatient as previously scheduled Follow-up with general surgery, Dr. Johney Maine as previously scheduled Discontinued Eliquis, restarted on Coumadin which she previously was on Please obtain BMP and INR in 1 week  Home Health: RN/PT/OT Equipment/Devices: Walker  Discharge Condition: Stable CODE STATUS: Full code Diet recommendation: Heart healthy diet  History of present illness:  Hannah Downs is a 76 year old female with past medical history significant for paroxysmal atrial fibrillation on Eliquis (recently changed from Coumadin), hyperlipidemia, recently admitted for incarcerated left inguinal hernia with hospitalization complicated by atrial fibrillation with RVR and CVA who was discharged 2 days prior who presented to the ED with dizziness after visiting the cardiology office.  Following outpatient cardiology visit, patient started feeling dizzy, nausea with episode of vomiting.  Denies chest pain or shortness of breath.  Denies any weakness of extremities or no visual symptoms.  Patient initially presented to [facility], but due to wait time presented to [facility] ED for evaluation.  In the ED, temperature 98.2 F, HR 82, RR 14, BP 144/82, SPO2 98% on room air.  WBC 10.1, hemoglobin 10.4, platelets 312.  Sodium 128, potassium 4.1, chloride 93, CO2 28, glucose 121, creatinine 0.39, BUN 12, AST 59, ALT 52, total bilirubin 1.5.  COVID-19 PCR negative.  Influenza A/B PCR negative.  CT abdomen/pelvis with no bowel perforation, s/p left inguinal hernia repair with fluid collection in the  pelvis, left lower quadrant communicating through the hernia tracking into air and Hirth right thigh containing air, concerning for dehiscence versus seroma or hematoma versus superimposed abscess.  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  Allergy was consulted.  Patient was transferred to [facility] for further evaluation and management under the hospitalist service.  Hospital course:  Assessment and Plan: CVA (cerebral vascular accident) [facility]) Patient presenting to ED with dizziness associated nausea and vomiting.  MR brain without contrast with subacute infarct left frontal lobe and acute/subacute infarct left parietal lobe.  TTE 2/26 with LVEF 60 to 65%, no thrombus noted.  LDL 43, hemoglobin A1c 5.4.  Neurology was consulted and followed during hospital course.  Started on aspirin 81 mg p.o. daily.  Continued on atorvastatin 20 mg p.o. daily.  Eliquis was discontinued and patient was restarted on Coumadin 5 mg p.o. daily.  Seen by physical and Occupational Therapy with recommendations of home health on discharge.  Will need close follow-up with cardiology for repeat INR next week.  Patient also to follow-up with neurology as previously scheduled.  Postoperative seroma involving digestive system after digestive system procedure Patient with recent bilateral inguinal/femoral hernia repair by Dr. Johney Maine on 11/05/2021. CT abdomen/pelvis on admission with no bowel perforation, s/p left inguinal hernia repair with fluid collection in the pelvis, left lower quadrant communicating through the hernia tracking into air and Hirth right thigh containing air, concerning for dehiscence versus seroma or hematoma versus superimposed abscess.  Evaluated by general surgery on 11/11/2021, etiology secondary to postoperative left  groin seroma which is improving and reabsorbing, no acute intervention  is faintly visualized in the right supraclavicular region. The left chest wall soft tissue gas is not definitively seen. 2. Low lung volumes with bibasilar atelectasis. Electronically Signed   By: Keith Rake M.D.   On: 11/07/2021 23:45   ECHOCARDIOGRAM COMPLETE  Result Date: 11/08/2021    ECHOCARDIOGRAM REPORT   Patient Name:   Hannah Downs Date of Exam: 11/08/2021 Medical Rec #:  [id number]           Height:       61.0 in Accession #:    [id number]          Weight:       116.0 lb Date of Birth:  [birth year]           BSA:          1.498 m Patient Age:    76 years            BP:           154/101 mmHg Patient Gender: F                   HR:           80 bpm. Exam Location:  Inpatient Procedure: 2D Echo, Cardiac Doppler and Color Doppler Indications:    CVA  History:        Patient has prior history of Echocardiogram examinations, most                 recent 04/01/2020. Risk Factors:Hypertension and Dyslipidemia.  Sonographer:    Luisa Hart RDCS Referring Phys: [phone] A CALDWELL POWELL JR  Sonographer Comments: No parasternal window, suboptimal apical window and suboptimal subcostal window. Image acquisition challenging due to patient body habitus. IMPRESSIONS  1. Technically difficult; no parasternal views.  2. Left ventricular ejection fraction, by estimation, is 60 to 65%. The left ventricle has normal function. The left ventricle has no regional wall motion abnormalities. Left ventricular diastolic parameters are consistent with Grade II diastolic dysfunction (pseudonormalization).  3. Right ventricular systolic function is normal. The right ventricular size is normal.  4. Left atrial size was mildly dilated.  5. Right atrial size was mildly dilated.  6. The mitral valve is normal in structure. Trivial mitral valve regurgitation. No evidence of mitral stenosis.  7. The aortic valve was not assessed. Aortic valve  regurgitation is not visualized. No aortic stenosis is present.  8. Pulmonic valve regurgitation Not assessed.  9. The inferior vena cava is normal in size with greater than 50% respiratory variability, suggesting right atrial pressure of 3 mmHg. FINDINGS  Left Ventricle: Left ventricular ejection fraction, by estimation, is 60 to 65%. The left ventricle has normal function. The left ventricle has no regional wall motion abnormalities. The left ventricular internal cavity size was normal in size. There is  no left ventricular hypertrophy. Left ventricular diastolic parameters are consistent with Grade II diastolic dysfunction (pseudonormalization). Right Ventricle: The right ventricular size is normal. Right ventricular systolic function is normal. Left Atrium: Left atrial size was mildly dilated. Right Atrium: Right atrial size was mildly dilated. Pericardium: There is no evidence of pericardial effusion. Mitral Valve: The mitral valve is normal in structure. Trivial mitral valve regurgitation. No evidence of mitral valve stenosis. MV peak gradient, 2.4 mmHg. The mean mitral valve gradient is 1.0 mmHg. Tricuspid Valve: The tricuspid valve is normal in structure. Tricuspid valve regurgitation is not demonstrated. No evidence of tricuspid stenosis. Aortic Valve: The aortic valve was not assessed. Aortic  Physician Discharge Summary  Hannah Downs [id number] DOB: 31-Oct-1945 DOA: 11/11/2021  PCP: Harlan Stains, MD  Admit date: 11/11/2021 Discharge date: 11/13/2021  Admitted From: Home Disposition: Home  Recommendations for Outpatient Follow-up:  Follow up with PCP in 1-2 weeks Follow-up with cardiology as scheduled, Coumadin clinic next week for repeat INR Follow-up with neurology outpatient as previously scheduled Follow-up with general surgery, Dr. Johney Maine as previously scheduled Discontinued Eliquis, restarted on Coumadin which she previously was on Please obtain BMP and INR in 1 week  Home Health: RN/PT/OT Equipment/Devices: Walker  Discharge Condition: Stable CODE STATUS: Full code Diet recommendation: Heart healthy diet  History of present illness:  Hannah Downs is a 76 year old female with past medical history significant for paroxysmal atrial fibrillation on Eliquis (recently changed from Coumadin), hyperlipidemia, recently admitted for incarcerated left inguinal hernia with hospitalization complicated by atrial fibrillation with RVR and CVA who was discharged 2 days prior who presented to the ED with dizziness after visiting the cardiology office.  Following outpatient cardiology visit, patient started feeling dizzy, nausea with episode of vomiting.  Denies chest pain or shortness of breath.  Denies any weakness of extremities or no visual symptoms.  Patient initially presented to [facility], but due to wait time presented to [facility] ED for evaluation.  In the ED, temperature 98.2 F, HR 82, RR 14, BP 144/82, SPO2 98% on room air.  WBC 10.1, hemoglobin 10.4, platelets 312.  Sodium 128, potassium 4.1, chloride 93, CO2 28, glucose 121, creatinine 0.39, BUN 12, AST 59, ALT 52, total bilirubin 1.5.  COVID-19 PCR negative.  Influenza A/B PCR negative.  CT abdomen/pelvis with no bowel perforation, s/p left inguinal hernia repair with fluid collection in the  pelvis, left lower quadrant communicating through the hernia tracking into air and Hirth right thigh containing air, concerning for dehiscence versus seroma or hematoma versus superimposed abscess.  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  Allergy was consulted.  Patient was transferred to [facility] for further evaluation and management under the hospitalist service.  Hospital course:  Assessment and Plan: CVA (cerebral vascular accident) [facility]) Patient presenting to ED with dizziness associated nausea and vomiting.  MR brain without contrast with subacute infarct left frontal lobe and acute/subacute infarct left parietal lobe.  TTE 2/26 with LVEF 60 to 65%, no thrombus noted.  LDL 43, hemoglobin A1c 5.4.  Neurology was consulted and followed during hospital course.  Started on aspirin 81 mg p.o. daily.  Continued on atorvastatin 20 mg p.o. daily.  Eliquis was discontinued and patient was restarted on Coumadin 5 mg p.o. daily.  Seen by physical and Occupational Therapy with recommendations of home health on discharge.  Will need close follow-up with cardiology for repeat INR next week.  Patient also to follow-up with neurology as previously scheduled.  Postoperative seroma involving digestive system after digestive system procedure Patient with recent bilateral inguinal/femoral hernia repair by Dr. Johney Maine on 11/05/2021. CT abdomen/pelvis on admission with no bowel perforation, s/p left inguinal hernia repair with fluid collection in the pelvis, left lower quadrant communicating through the hernia tracking into air and Hirth right thigh containing air, concerning for dehiscence versus seroma or hematoma versus superimposed abscess.  Evaluated by general surgery on 11/11/2021, etiology secondary to postoperative left  groin seroma which is improving and reabsorbing, no acute intervention  is faintly visualized in the right supraclavicular region. The left chest wall soft tissue gas is not definitively seen. 2. Low lung volumes with bibasilar atelectasis. Electronically Signed   By: Keith Rake M.D.   On: 11/07/2021 23:45   ECHOCARDIOGRAM COMPLETE  Result Date: 11/08/2021    ECHOCARDIOGRAM REPORT   Patient Name:   Hannah Downs Date of Exam: 11/08/2021 Medical Rec #:  [id number]           Height:       61.0 in Accession #:    [id number]          Weight:       116.0 lb Date of Birth:  [birth year]           BSA:          1.498 m Patient Age:    76 years            BP:           154/101 mmHg Patient Gender: F                   HR:           80 bpm. Exam Location:  Inpatient Procedure: 2D Echo, Cardiac Doppler and Color Doppler Indications:    CVA  History:        Patient has prior history of Echocardiogram examinations, most                 recent 04/01/2020. Risk Factors:Hypertension and Dyslipidemia.  Sonographer:    Luisa Hart RDCS Referring Phys: [phone] A CALDWELL POWELL JR  Sonographer Comments: No parasternal window, suboptimal apical window and suboptimal subcostal window. Image acquisition challenging due to patient body habitus. IMPRESSIONS  1. Technically difficult; no parasternal views.  2. Left ventricular ejection fraction, by estimation, is 60 to 65%. The left ventricle has normal function. The left ventricle has no regional wall motion abnormalities. Left ventricular diastolic parameters are consistent with Grade II diastolic dysfunction (pseudonormalization).  3. Right ventricular systolic function is normal. The right ventricular size is normal.  4. Left atrial size was mildly dilated.  5. Right atrial size was mildly dilated.  6. The mitral valve is normal in structure. Trivial mitral valve regurgitation. No evidence of mitral stenosis.  7. The aortic valve was not assessed. Aortic valve  regurgitation is not visualized. No aortic stenosis is present.  8. Pulmonic valve regurgitation Not assessed.  9. The inferior vena cava is normal in size with greater than 50% respiratory variability, suggesting right atrial pressure of 3 mmHg. FINDINGS  Left Ventricle: Left ventricular ejection fraction, by estimation, is 60 to 65%. The left ventricle has normal function. The left ventricle has no regional wall motion abnormalities. The left ventricular internal cavity size was normal in size. There is  no left ventricular hypertrophy. Left ventricular diastolic parameters are consistent with Grade II diastolic dysfunction (pseudonormalization). Right Ventricle: The right ventricular size is normal. Right ventricular systolic function is normal. Left Atrium: Left atrial size was mildly dilated. Right Atrium: Right atrial size was mildly dilated. Pericardium: There is no evidence of pericardial effusion. Mitral Valve: The mitral valve is normal in structure. Trivial mitral valve regurgitation. No evidence of mitral valve stenosis. MV peak gradient, 2.4 mmHg. The mean mitral valve gradient is 1.0 mmHg. Tricuspid Valve: The tricuspid valve is normal in structure. Tricuspid valve regurgitation is not demonstrated. No evidence of tricuspid stenosis. Aortic Valve: The aortic valve was not assessed. Aortic  Physician Discharge Summary  Hannah Downs [id number] DOB: 31-Oct-1945 DOA: 11/11/2021  PCP: Harlan Stains, MD  Admit date: 11/11/2021 Discharge date: 11/13/2021  Admitted From: Home Disposition: Home  Recommendations for Outpatient Follow-up:  Follow up with PCP in 1-2 weeks Follow-up with cardiology as scheduled, Coumadin clinic next week for repeat INR Follow-up with neurology outpatient as previously scheduled Follow-up with general surgery, Dr. Johney Maine as previously scheduled Discontinued Eliquis, restarted on Coumadin which she previously was on Please obtain BMP and INR in 1 week  Home Health: RN/PT/OT Equipment/Devices: Walker  Discharge Condition: Stable CODE STATUS: Full code Diet recommendation: Heart healthy diet  History of present illness:  Hannah Downs is a 76 year old female with past medical history significant for paroxysmal atrial fibrillation on Eliquis (recently changed from Coumadin), hyperlipidemia, recently admitted for incarcerated left inguinal hernia with hospitalization complicated by atrial fibrillation with RVR and CVA who was discharged 2 days prior who presented to the ED with dizziness after visiting the cardiology office.  Following outpatient cardiology visit, patient started feeling dizzy, nausea with episode of vomiting.  Denies chest pain or shortness of breath.  Denies any weakness of extremities or no visual symptoms.  Patient initially presented to [facility], but due to wait time presented to [facility] ED for evaluation.  In the ED, temperature 98.2 F, HR 82, RR 14, BP 144/82, SPO2 98% on room air.  WBC 10.1, hemoglobin 10.4, platelets 312.  Sodium 128, potassium 4.1, chloride 93, CO2 28, glucose 121, creatinine 0.39, BUN 12, AST 59, ALT 52, total bilirubin 1.5.  COVID-19 PCR negative.  Influenza A/B PCR negative.  CT abdomen/pelvis with no bowel perforation, s/p left inguinal hernia repair with fluid collection in the  pelvis, left lower quadrant communicating through the hernia tracking into air and Hirth right thigh containing air, concerning for dehiscence versus seroma or hematoma versus superimposed abscess.  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  Allergy was consulted.  Patient was transferred to [facility] for further evaluation and management under the hospitalist service.  Hospital course:  Assessment and Plan: CVA (cerebral vascular accident) [facility]) Patient presenting to ED with dizziness associated nausea and vomiting.  MR brain without contrast with subacute infarct left frontal lobe and acute/subacute infarct left parietal lobe.  TTE 2/26 with LVEF 60 to 65%, no thrombus noted.  LDL 43, hemoglobin A1c 5.4.  Neurology was consulted and followed during hospital course.  Started on aspirin 81 mg p.o. daily.  Continued on atorvastatin 20 mg p.o. daily.  Eliquis was discontinued and patient was restarted on Coumadin 5 mg p.o. daily.  Seen by physical and Occupational Therapy with recommendations of home health on discharge.  Will need close follow-up with cardiology for repeat INR next week.  Patient also to follow-up with neurology as previously scheduled.  Postoperative seroma involving digestive system after digestive system procedure Patient with recent bilateral inguinal/femoral hernia repair by Dr. Johney Maine on 11/05/2021. CT abdomen/pelvis on admission with no bowel perforation, s/p left inguinal hernia repair with fluid collection in the pelvis, left lower quadrant communicating through the hernia tracking into air and Hirth right thigh containing air, concerning for dehiscence versus seroma or hematoma versus superimposed abscess.  Evaluated by general surgery on 11/11/2021, etiology secondary to postoperative left  groin seroma which is improving and reabsorbing, no acute intervention  is faintly visualized in the right supraclavicular region. The left chest wall soft tissue gas is not definitively seen. 2. Low lung volumes with bibasilar atelectasis. Electronically Signed   By: Keith Rake M.D.   On: 11/07/2021 23:45   ECHOCARDIOGRAM COMPLETE  Result Date: 11/08/2021    ECHOCARDIOGRAM REPORT   Patient Name:   Hannah Downs Date of Exam: 11/08/2021 Medical Rec #:  [id number]           Height:       61.0 in Accession #:    [id number]          Weight:       116.0 lb Date of Birth:  [birth year]           BSA:          1.498 m Patient Age:    76 years            BP:           154/101 mmHg Patient Gender: F                   HR:           80 bpm. Exam Location:  Inpatient Procedure: 2D Echo, Cardiac Doppler and Color Doppler Indications:    CVA  History:        Patient has prior history of Echocardiogram examinations, most                 recent 04/01/2020. Risk Factors:Hypertension and Dyslipidemia.  Sonographer:    Luisa Hart RDCS Referring Phys: [phone] A CALDWELL POWELL JR  Sonographer Comments: No parasternal window, suboptimal apical window and suboptimal subcostal window. Image acquisition challenging due to patient body habitus. IMPRESSIONS  1. Technically difficult; no parasternal views.  2. Left ventricular ejection fraction, by estimation, is 60 to 65%. The left ventricle has normal function. The left ventricle has no regional wall motion abnormalities. Left ventricular diastolic parameters are consistent with Grade II diastolic dysfunction (pseudonormalization).  3. Right ventricular systolic function is normal. The right ventricular size is normal.  4. Left atrial size was mildly dilated.  5. Right atrial size was mildly dilated.  6. The mitral valve is normal in structure. Trivial mitral valve regurgitation. No evidence of mitral stenosis.  7. The aortic valve was not assessed. Aortic valve  regurgitation is not visualized. No aortic stenosis is present.  8. Pulmonic valve regurgitation Not assessed.  9. The inferior vena cava is normal in size with greater than 50% respiratory variability, suggesting right atrial pressure of 3 mmHg. FINDINGS  Left Ventricle: Left ventricular ejection fraction, by estimation, is 60 to 65%. The left ventricle has normal function. The left ventricle has no regional wall motion abnormalities. The left ventricular internal cavity size was normal in size. There is  no left ventricular hypertrophy. Left ventricular diastolic parameters are consistent with Grade II diastolic dysfunction (pseudonormalization). Right Ventricle: The right ventricular size is normal. Right ventricular systolic function is normal. Left Atrium: Left atrial size was mildly dilated. Right Atrium: Right atrial size was mildly dilated. Pericardium: There is no evidence of pericardial effusion. Mitral Valve: The mitral valve is normal in structure. Trivial mitral valve regurgitation. No evidence of mitral valve stenosis. MV peak gradient, 2.4 mmHg. The mean mitral valve gradient is 1.0 mmHg. Tricuspid Valve: The tricuspid valve is normal in structure. Tricuspid valve regurgitation is not demonstrated. No evidence of tricuspid stenosis. Aortic Valve: The aortic valve was not assessed. Aortic  Physician Discharge Summary  Hannah Downs [id number] DOB: 31-Oct-1945 DOA: 11/11/2021  PCP: Harlan Stains, MD  Admit date: 11/11/2021 Discharge date: 11/13/2021  Admitted From: Home Disposition: Home  Recommendations for Outpatient Follow-up:  Follow up with PCP in 1-2 weeks Follow-up with cardiology as scheduled, Coumadin clinic next week for repeat INR Follow-up with neurology outpatient as previously scheduled Follow-up with general surgery, Dr. Johney Maine as previously scheduled Discontinued Eliquis, restarted on Coumadin which she previously was on Please obtain BMP and INR in 1 week  Home Health: RN/PT/OT Equipment/Devices: Walker  Discharge Condition: Stable CODE STATUS: Full code Diet recommendation: Heart healthy diet  History of present illness:  Hannah Downs is a 76 year old female with past medical history significant for paroxysmal atrial fibrillation on Eliquis (recently changed from Coumadin), hyperlipidemia, recently admitted for incarcerated left inguinal hernia with hospitalization complicated by atrial fibrillation with RVR and CVA who was discharged 2 days prior who presented to the ED with dizziness after visiting the cardiology office.  Following outpatient cardiology visit, patient started feeling dizzy, nausea with episode of vomiting.  Denies chest pain or shortness of breath.  Denies any weakness of extremities or no visual symptoms.  Patient initially presented to [facility], but due to wait time presented to [facility] ED for evaluation.  In the ED, temperature 98.2 F, HR 82, RR 14, BP 144/82, SPO2 98% on room air.  WBC 10.1, hemoglobin 10.4, platelets 312.  Sodium 128, potassium 4.1, chloride 93, CO2 28, glucose 121, creatinine 0.39, BUN 12, AST 59, ALT 52, total bilirubin 1.5.  COVID-19 PCR negative.  Influenza A/B PCR negative.  CT abdomen/pelvis with no bowel perforation, s/p left inguinal hernia repair with fluid collection in the  pelvis, left lower quadrant communicating through the hernia tracking into air and Hirth right thigh containing air, concerning for dehiscence versus seroma or hematoma versus superimposed abscess.  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  Allergy was consulted.  Patient was transferred to [facility] for further evaluation and management under the hospitalist service.  Hospital course:  Assessment and Plan: CVA (cerebral vascular accident) [facility]) Patient presenting to ED with dizziness associated nausea and vomiting.  MR brain without contrast with subacute infarct left frontal lobe and acute/subacute infarct left parietal lobe.  TTE 2/26 with LVEF 60 to 65%, no thrombus noted.  LDL 43, hemoglobin A1c 5.4.  Neurology was consulted and followed during hospital course.  Started on aspirin 81 mg p.o. daily.  Continued on atorvastatin 20 mg p.o. daily.  Eliquis was discontinued and patient was restarted on Coumadin 5 mg p.o. daily.  Seen by physical and Occupational Therapy with recommendations of home health on discharge.  Will need close follow-up with cardiology for repeat INR next week.  Patient also to follow-up with neurology as previously scheduled.  Postoperative seroma involving digestive system after digestive system procedure Patient with recent bilateral inguinal/femoral hernia repair by Dr. Johney Maine on 11/05/2021. CT abdomen/pelvis on admission with no bowel perforation, s/p left inguinal hernia repair with fluid collection in the pelvis, left lower quadrant communicating through the hernia tracking into air and Hirth right thigh containing air, concerning for dehiscence versus seroma or hematoma versus superimposed abscess.  Evaluated by general surgery on 11/11/2021, etiology secondary to postoperative left  groin seroma which is improving and reabsorbing, no acute intervention  Physician Discharge Summary  Hannah Downs [id number] DOB: 31-Oct-1945 DOA: 11/11/2021  PCP: Harlan Stains, MD  Admit date: 11/11/2021 Discharge date: 11/13/2021  Admitted From: Home Disposition: Home  Recommendations for Outpatient Follow-up:  Follow up with PCP in 1-2 weeks Follow-up with cardiology as scheduled, Coumadin clinic next week for repeat INR Follow-up with neurology outpatient as previously scheduled Follow-up with general surgery, Dr. Johney Maine as previously scheduled Discontinued Eliquis, restarted on Coumadin which she previously was on Please obtain BMP and INR in 1 week  Home Health: RN/PT/OT Equipment/Devices: Walker  Discharge Condition: Stable CODE STATUS: Full code Diet recommendation: Heart healthy diet  History of present illness:  Hannah Downs is a 76 year old female with past medical history significant for paroxysmal atrial fibrillation on Eliquis (recently changed from Coumadin), hyperlipidemia, recently admitted for incarcerated left inguinal hernia with hospitalization complicated by atrial fibrillation with RVR and CVA who was discharged 2 days prior who presented to the ED with dizziness after visiting the cardiology office.  Following outpatient cardiology visit, patient started feeling dizzy, nausea with episode of vomiting.  Denies chest pain or shortness of breath.  Denies any weakness of extremities or no visual symptoms.  Patient initially presented to [facility], but due to wait time presented to [facility] long ED for evaluation.  In the ED, temperature 98.2 F, HR 82, RR 14, BP 144/82, SPO2 98% on room air.  WBC 10.1, hemoglobin 10.4, platelets 312.  Sodium 128, potassium 4.1, chloride 93, CO2 28, glucose 121, creatinine 0.39, BUN 12, AST 59, ALT 52, total bilirubin 1.5.  COVID-19 PCR negative.  Influenza A/B PCR negative.  CT abdomen/pelvis with no bowel perforation, s/p left inguinal hernia repair with fluid collection in the  pelvis, left lower quadrant communicating through the hernia tracking into air and Hirth right thigh containing air, concerning for dehiscence versus seroma or hematoma versus superimposed abscess.  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  Allergy was consulted.  Patient was transferred to [facility] for further evaluation and management under the hospitalist service.  Hospital course:  Assessment and Plan: CVA (cerebral vascular accident) [facility]) Patient presenting to ED with dizziness associated nausea and vomiting.  MR brain without contrast with subacute infarct left frontal lobe and acute/subacute infarct left parietal lobe.  TTE 2/26 with LVEF 60 to 65%, no thrombus noted.  LDL 43, hemoglobin A1c 5.4.  Neurology was consulted and followed during hospital course.  Started on aspirin 81 mg p.o. daily.  Continued on atorvastatin 20 mg p.o. daily.  Eliquis was discontinued and patient was restarted on Coumadin 5 mg p.o. daily.  Seen by physical and Occupational Therapy with recommendations of home health on discharge.  Will need close follow-up with cardiology for repeat INR next week.  Patient also to follow-up with neurology as previously scheduled.  Postoperative seroma involving digestive system after digestive system procedure Patient with recent bilateral inguinal/femoral hernia repair by Dr. Johney Maine on 11/05/2021. CT abdomen/pelvis on admission with no bowel perforation, s/p left inguinal hernia repair with fluid collection in the pelvis, left lower quadrant communicating through the hernia tracking into air and Hirth right thigh containing air, concerning for dehiscence versus seroma or hematoma versus superimposed abscess.  Evaluated by general surgery on 11/11/2021, etiology secondary to postoperative left  groin seroma which is improving and reabsorbing, no acute intervention  is faintly visualized in the right supraclavicular region. The left chest wall soft tissue gas is not definitively seen. 2. Low lung volumes with bibasilar atelectasis. Electronically Signed   By: Keith Rake M.D.   On: 11/07/2021 23:45   ECHOCARDIOGRAM COMPLETE  Result Date: 11/08/2021    ECHOCARDIOGRAM REPORT   Patient Name:   Hannah Downs Date of Exam: 11/08/2021 Medical Rec #:  [id number]           Height:       61.0 in Accession #:    [id number]          Weight:       116.0 lb Date of Birth:  [birth year]           BSA:          1.498 m Patient Age:    76 years            BP:           154/101 mmHg Patient Gender: F                   HR:           80 bpm. Exam Location:  Inpatient Procedure: 2D Echo, Cardiac Doppler and Color Doppler Indications:    CVA  History:        Patient has prior history of Echocardiogram examinations, most                 recent 04/01/2020. Risk Factors:Hypertension and Dyslipidemia.  Sonographer:    Luisa Hart RDCS Referring Phys: [phone] A CALDWELL POWELL JR  Sonographer Comments: No parasternal window, suboptimal apical window and suboptimal subcostal window. Image acquisition challenging due to patient body habitus. IMPRESSIONS  1. Technically difficult; no parasternal views.  2. Left ventricular ejection fraction, by estimation, is 60 to 65%. The left ventricle has normal function. The left ventricle has no regional wall motion abnormalities. Left ventricular diastolic parameters are consistent with Grade II diastolic dysfunction (pseudonormalization).  3. Right ventricular systolic function is normal. The right ventricular size is normal.  4. Left atrial size was mildly dilated.  5. Right atrial size was mildly dilated.  6. The mitral valve is normal in structure. Trivial mitral valve regurgitation. No evidence of mitral stenosis.  7. The aortic valve was not assessed. Aortic valve  regurgitation is not visualized. No aortic stenosis is present.  8. Pulmonic valve regurgitation Not assessed.  9. The inferior vena cava is normal in size with greater than 50% respiratory variability, suggesting right atrial pressure of 3 mmHg. FINDINGS  Left Ventricle: Left ventricular ejection fraction, by estimation, is 60 to 65%. The left ventricle has normal function. The left ventricle has no regional wall motion abnormalities. The left ventricular internal cavity size was normal in size. There is  no left ventricular hypertrophy. Left ventricular diastolic parameters are consistent with Grade II diastolic dysfunction (pseudonormalization). Right Ventricle: The right ventricular size is normal. Right ventricular systolic function is normal. Left Atrium: Left atrial size was mildly dilated. Right Atrium: Right atrial size was mildly dilated. Pericardium: There is no evidence of pericardial effusion. Mitral Valve: The mitral valve is normal in structure. Trivial mitral valve regurgitation. No evidence of mitral valve stenosis. MV peak gradient, 2.4 mmHg. The mean mitral valve gradient is 1.0 mmHg. Tricuspid Valve: The tricuspid valve is normal in structure. Tricuspid valve regurgitation is not demonstrated. No evidence of tricuspid stenosis. Aortic Valve: The aortic valve was not assessed. Aortic

## 2021-11-13 NOTE — Progress Notes (Signed)
ANTICOAGULATION CONSULT NOTE - Follow Up Consult ? ?Pharmacy Consult for Warfarin ?Indication: atrial fibrillation and stroke ? ?Allergies  ?Allergen Reactions  ? Lisinopril Cough  ? ? ?Patient Measurements: ?Height: 5\' 1"  (154.9 cm) ?Weight: 56.2 kg (124 lb) ?IBW/kg (Calculated) : 47.8 kg ?Heparin Dosing Weight: 52.6 kg ? ?Vital Signs: ?Temp: 98.3 ?F (36.8 ?C) (03/03 0414) ?Temp Source: Oral (03/03 0414) ?BP: 123/84 (03/03 0414) ?Pulse Rate: 78 (03/03 0414) ? ?Labs: ?Recent Labs  ?  11/10/21 ?2235 11/11/21 ?0240 11/11/21 ?[id number] 11/12/21 ?1045 11/13/21 ?[id number]  ?HGB 10.9*  --  10.4*  --   --   ?HCT 32.2*  --  31.2*  --   --   ?PLT 321  --  312  --   --   ?LABPROT 16.9*  --   --  14.5 14.6  ?INR 1.4*  --   --  1.1 1.1  ?CREATININE 0.51  --  0.39* 0.61 0.45  ?TROPONINIHS  --  32* 34*  --   --   ? ? ? ?Estimated Creatinine Clearance: 45.1 mL/min (by C-G formula based on SCr of 0.45 mg/dL). ? ?Assessment: ?76 yo female presented to [facility] ED on 2/22 with lower abdominal pain, nausea, and vomiting, found to have recurrent left femoral hernia incarcerated with small bowel and causing obstruction. PTA the patient is on warfarin for atrial fibrillation. INR 2.7 upon admission and the patient was given vitamin K IV 10 mg and KCentra on 2/22. The patient is s/p lysis of adhesions and reduction of recurrent hernia on 2/23.  ? ?Discharged on Eliquis and readmitted with new CVA, now back on Warfarin ? ?Goal of Therapy:  ?INR 2 to 3 ?Monitor platelets by anticoagulation protocol: Yes ?  ?Plan:  ?Continue Warfarin 5 mg po daily ?Daily INR ? ?Thank you ?Anette Guarneri, PharmD ?11/13/2021 9:27 AM ? ?Please check [email or web address] for unit-specific pharmacy phone numbers. ? ? ? ?

## 2021-11-13 NOTE — Progress Notes (Signed)
Discharge instructions given to the patient and daughter Irving Burton.  Both expressed understanding of instructions.  Encouraged to call the doctor for concerns.  Needs addressed.  Discharged home. ?

## 2021-11-13 NOTE — TOC Transition Note (Signed)
Transition of Care (TOC) - CM/SW Discharge Note ? ? ?Patient Details  ?Name: Hannah Downs ?MRN: [id number] ?Date of Birth: 05-Aug-1946 ? ?Transition of Care (TOC) CM/SW Contact:  ?Kermit Balo, RN ?Phone Number: ?11/13/2021, 11:19 AM ? ? ?Clinical Narrative:    ?Patient discharging home with home health services through [facility]. Information on the AVS.  ?Walker for home is at the bedside.  ?Daughter to call and obtain a coumadin appointment per MD and pt.  ?Pt has transportation home.  ? ? ?Final next level of care: Home w Home Health Services ?Barriers to Discharge: No Barriers Identified ? ? ?Patient Goals and CMS Choice ?Patient states their goals for this hospitalization and ongoing recovery are:: CIR ?CMS Medicare.gov Compare Post Acute Care list provided to:: Patient ?Choice offered to / list presented to : Patient ? ?Discharge Placement ?  ?           ?  ?  ?  ?  ? ?Discharge Plan and Services ?  ?Discharge Planning Services: CM Consult ?Post Acute Care Choice: Home Health, Durable Medical Equipment          ?DME Arranged: Walker rolling ?DME Agency: [employer] ?Date DME Agency Contacted: 11/12/21 ?  ?Representative spoke with at DME Agency: Velna Hatchet ?HH Arranged: PT ?HH Agency: [facility] ?Date HH Agency Contacted: 11/12/21 ?  ?Representative spoke with at [facility] Agency: Misty Stanley ? ?Social Determinants of Health (SDOH) Interventions ?  ? ? ?Readmission Risk Interventions ?No flowsheet data found. ? ? ? ? ?

## 2021-11-13 NOTE — Telephone Encounter (Signed)
New Message: ? ? ? ?Daughter said she was told by the Neurologist at [facility] to call. He wanted  her to call to let Dr Elease Hashimoto  know that the patient suffered a stroke of the Cerebellum while she was in the office(Monday, 11-09-26). The  Neurology team at [facility] caleld it an Eliquis Failure, because Eliquis had failed to do what it needed to do. Patient was started on Coumadin last night.(11-12-21) and still taking her Baby Aspirin.  Daughter says if you have any questions, please give her a call. ?

## 2021-11-14 ENCOUNTER — Telehealth: Payer: Self-pay | Admitting: Neurology

## 2021-11-14 NOTE — Telephone Encounter (Signed)
Dr. Acie Fredrickson had reached out to me to discuss about concerns I had earlier about Eliquis failure that I had mentioned in my consult note earlier from 11/12/21. At that time, I saw Ms. Hannah Downs after she developed a R cerbellar stroke. She had taken her 3rd dose of Eliquis and then had a R cerebellar stroke.  ? ?I spoke with the rest of my partners and the consensus is that this is unlikely to be Eliquis failure. High suspicion that she probably developed a small Atrial appendage thrombus while patient was off Anticoagulation for Hernia repair and that probably embolized and caused her to have a R cerebellar stroke. ? ?It is reasonable to try Eliquis again. ? ?I called patient and spoke with her and with her daughter Hannah Downs to clarify this. I also sent Dr. Acie Fredrickson a secure chat message. ? ?Donnetta Simpers ?[facility] ?Pager Number [phone] ? ?

## 2021-11-16 ENCOUNTER — Other Ambulatory Visit: Payer: Self-pay | Admitting: Cardiovascular Disease

## 2021-11-17 DIAGNOSIS — M5136 Other intervertebral disc degeneration, lumbar region: Secondary | ICD-10-CM | POA: Diagnosis not present

## 2021-11-17 DIAGNOSIS — F4320 Adjustment disorder, unspecified: Secondary | ICD-10-CM | POA: Diagnosis not present

## 2021-11-17 DIAGNOSIS — K219 Gastro-esophageal reflux disease without esophagitis: Secondary | ICD-10-CM | POA: Diagnosis not present

## 2021-11-17 DIAGNOSIS — I480 Paroxysmal atrial fibrillation: Secondary | ICD-10-CM | POA: Diagnosis not present

## 2021-11-17 DIAGNOSIS — Z48815 Encounter for surgical aftercare following surgery on the digestive system: Secondary | ICD-10-CM | POA: Diagnosis not present

## 2021-11-17 DIAGNOSIS — I10 Essential (primary) hypertension: Secondary | ICD-10-CM | POA: Diagnosis not present

## 2021-11-17 DIAGNOSIS — I8391 Asymptomatic varicose veins of right lower extremity: Secondary | ICD-10-CM | POA: Diagnosis not present

## 2021-11-17 DIAGNOSIS — Z8673 Personal history of transient ischemic attack (TIA), and cerebral infarction without residual deficits: Secondary | ICD-10-CM | POA: Diagnosis not present

## 2021-11-17 DIAGNOSIS — E559 Vitamin D deficiency, unspecified: Secondary | ICD-10-CM | POA: Diagnosis not present

## 2021-11-17 DIAGNOSIS — Z8616 Personal history of COVID-19: Secondary | ICD-10-CM | POA: Diagnosis not present

## 2021-11-17 DIAGNOSIS — M8580 Other specified disorders of bone density and structure, unspecified site: Secondary | ICD-10-CM | POA: Diagnosis not present

## 2021-11-17 DIAGNOSIS — D6869 Other thrombophilia: Secondary | ICD-10-CM | POA: Diagnosis not present

## 2021-11-17 DIAGNOSIS — Z7901 Long term (current) use of anticoagulants: Secondary | ICD-10-CM | POA: Diagnosis not present

## 2021-11-17 DIAGNOSIS — R3914 Feeling of incomplete bladder emptying: Secondary | ICD-10-CM | POA: Diagnosis not present

## 2021-11-17 DIAGNOSIS — I700 Atherosclerosis of aorta: Secondary | ICD-10-CM | POA: Diagnosis not present

## 2021-11-17 DIAGNOSIS — E7800 Pure hypercholesterolemia, unspecified: Secondary | ICD-10-CM | POA: Diagnosis not present

## 2021-11-17 DIAGNOSIS — M1990 Unspecified osteoarthritis, unspecified site: Secondary | ICD-10-CM | POA: Diagnosis not present

## 2021-11-17 DIAGNOSIS — Z7982 Long term (current) use of aspirin: Secondary | ICD-10-CM | POA: Diagnosis not present

## 2021-11-18 ENCOUNTER — Other Ambulatory Visit: Payer: Self-pay

## 2021-11-18 ENCOUNTER — Telehealth: Payer: Self-pay

## 2021-11-18 ENCOUNTER — Ambulatory Visit (INDEPENDENT_AMBULATORY_CARE_PROVIDER_SITE_OTHER): Payer: Medicare Other

## 2021-11-18 DIAGNOSIS — I480 Paroxysmal atrial fibrillation: Secondary | ICD-10-CM

## 2021-11-18 DIAGNOSIS — I4891 Unspecified atrial fibrillation: Secondary | ICD-10-CM | POA: Diagnosis not present

## 2021-11-18 DIAGNOSIS — Z5181 Encounter for therapeutic drug level monitoring: Secondary | ICD-10-CM

## 2021-11-18 DIAGNOSIS — I639 Cerebral infarction, unspecified: Secondary | ICD-10-CM

## 2021-11-18 LAB — POCT INR: INR: 1.3 — AB (ref 2.0–3.0)

## 2021-11-18 MED ORDER — WARFARIN SODIUM 5 MG PO TABS: 5.0000 mg | ORAL_TABLET | Freq: Every day | ORAL | 0 refills | Status: DC

## 2021-11-18 NOTE — Patient Instructions (Signed)
Description   ?Take 1.5 tablets today and then resume taking 1 tablet (5mg ) daily.  ?Recheck INR in 1 week.  ?Coumadin Clinic [phone] ?  ?   ?

## 2021-11-18 NOTE — Telephone Encounter (Signed)
**Note De-identified Ameshia Pewitt Obfuscation** -----  **Note De-Identified Rosaleah Person Obfuscation** Message from Thayer Headings, MD sent at 11/10/2021  2:57 PM EST ----- ?Please see if patient qualifies for patients assistnace for Eliquis  ? ?Thanks ? ?Phil Nahser  ? ? ?

## 2021-11-18 NOTE — Telephone Encounter (Signed)
Prescription refill request received for warfarin ?Lov: 11/10/21 Hannah Downs) ?Next INR check: 11/24/21 ?Warfarin tablet strength: 5mg  ? ?Appropriate dose and refill sent to requested pharmacy.  ?

## 2021-11-18 NOTE — Telephone Encounter (Signed)
**Note De-Identified Eilam Shrewsbury Obfuscation** The pt states that she is staying on Warfarin as it orks best for her. ?She also states that her husband had a office visit with Dr Elease Hashimoto a few days ago and that he advised Dr Elease Hashimoto that she is staying on Warfarin. ? ?She thanked me for my call. ?

## 2021-11-20 DIAGNOSIS — Z8673 Personal history of transient ischemic attack (TIA), and cerebral infarction without residual deficits: Secondary | ICD-10-CM | POA: Diagnosis not present

## 2021-11-20 DIAGNOSIS — R7401 Elevation of levels of liver transaminase levels: Secondary | ICD-10-CM | POA: Diagnosis not present

## 2021-11-20 DIAGNOSIS — I10 Essential (primary) hypertension: Secondary | ICD-10-CM | POA: Diagnosis not present

## 2021-11-20 DIAGNOSIS — D649 Anemia, unspecified: Secondary | ICD-10-CM | POA: Diagnosis not present

## 2021-11-20 DIAGNOSIS — E785 Hyperlipidemia, unspecified: Secondary | ICD-10-CM | POA: Diagnosis not present

## 2021-11-20 DIAGNOSIS — I480 Paroxysmal atrial fibrillation: Secondary | ICD-10-CM | POA: Diagnosis not present

## 2021-11-20 DIAGNOSIS — D6869 Other thrombophilia: Secondary | ICD-10-CM | POA: Diagnosis not present

## 2021-11-23 ENCOUNTER — Other Ambulatory Visit: Payer: Self-pay

## 2021-11-23 ENCOUNTER — Encounter: Payer: Self-pay | Admitting: Obstetrics and Gynecology

## 2021-11-23 ENCOUNTER — Ambulatory Visit (INDEPENDENT_AMBULATORY_CARE_PROVIDER_SITE_OTHER): Payer: Medicare Other | Admitting: Obstetrics and Gynecology

## 2021-11-23 VITALS — BP 130/80 | HR 82 | Ht 60.0 in | Wt 122.0 lb

## 2021-11-23 DIAGNOSIS — R339 Retention of urine, unspecified: Secondary | ICD-10-CM

## 2021-11-23 DIAGNOSIS — N816 Rectocele: Secondary | ICD-10-CM

## 2021-11-23 DIAGNOSIS — N8110 Cystocele, unspecified: Secondary | ICD-10-CM

## 2021-11-23 DIAGNOSIS — N813 Complete uterovaginal prolapse: Secondary | ICD-10-CM

## 2021-11-23 NOTE — Progress Notes (Signed)
[facility] New Patient Evaluation and Consultation  Referring Provider: Bjorn Loser, MD PCP: Harlan Stains, MD Date of Service: 11/23/2021  SUBJECTIVE Chief Complaint: New Patient (Initial Visit) (Hannah Downs is a 76 y.o. female here for a prolapse evaluation.)  History of Present Illness: Hannah Downs is a 76 y.o. White or Caucasian female seen in consultation at the request of Dr. Matilde Sprang for evaluation of prolapse.    Review of records significant for: - 09/18/21- s/p robotic partial takedown of incarcerated left inguinal hernia- component of large bowel and medial bladder extending to vagina. From operative note: "medially the wall of the hernia seem to go very near the left lateral wall of the vagina which was everted by the prolapsed uterus... I elected to reperitonealized the left inguinal region having reduced quite a bit but and also reduced some adhesions were stuck in there but the hernia remains.  I explained this to her husband and any repair would likely need to be done in the form of a laparotomy that might involve a preop bowel prep but also to consider hysterectomy and anterior and posterior repair possibly rectopexy as well as herniorrhaphy." - Presented to ED on 09/20/21 with inability to void.  - 11/05/21 s/p Laparoscopic lysis of adhesions, Reduction of incarcerated recurrent left femoral hernia, Laparoscopic repair of incarcerated recurrent left femoral hernia , Laparoscopic repair of right femoral hernia with mesh, Laparoscopic repair of right obturator hernia -Post operatively she went into a.fib and developed CVA (right cerebellum). - Seen by Dr Lorra Hals who recommended a pessary and also placed referral to [facility] for potential surgery.   Urinary Symptoms: Does not leak urine.  Currently has a foley catheter in place.  Wears 2 diapers per day Prior to the catheter, she had difficulty urinating due to the prolapse. She would push  her bladder back inside and lean forward to urinate.  She has been having the catheter changed monthly at [facility].   UTIs: 1 UTI's in the last year.   Denies history of blood in urine and kidney or bladder stones  Pelvic Organ Prolapse Symptoms:                  She Admits to a feeling of a bulge the vaginal area. It has been present for 1 year.  She Admits to seeing a bulge.  This bulge is bothersome.  Bowel Symptom: Bowel movements: 1 time(s) per day Stool consistency: soft  Straining: no.  Splinting: no.  Incomplete evacuation: no.  She Denies accidental bowel leakage / fecal incontinence Bowel regimen: none  Sexual Function Sexually active: no.   Pelvic Pain Denies pelvic pain  Past Medical History:  Past Medical History:  Diagnosis Date   Arthritis    Dysrhythmia    afib   GERD (gastroesophageal reflux disease)    Hypercholesterolemia    Hypertension    Lumbar degenerative disc disease    Miscarriage    times 3   Osteopenia    Seasonal allergies    Stroke (HCC)    Varicose vein    right leg   Vitamin D deficiency      Past Surgical History:   Past Surgical History:  Procedure Laterality Date   DILATION AND CURETTAGE OF UTERUS  1998   hx of approx 26 years ago   FEMORAL HERNIA REPAIR  11/02/2006   Emergent reduction/primary repair.  Dr Effie Shy   INGUINAL HERNIA REPAIR  02/03/2007   open w  mesh.  Dr Effie Shy   INGUINAL HERNIA REPAIR N/A 11/04/2021   Procedure: lysis of adhesions reduction of incarcerated recurrent left femoral hernia laparoscopic repair. laparoscopic right femoral hernia repair and right obturator repair with mesh. tap block;  Surgeon: Michael Boston, MD;  Location: [facility];  Service: General;  Laterality: N/A;   ROBOTIC ASSISTED DIAGNOSTIC LAPAROSCOPY  09/18/2021   robotic reduction/partial takedown of recurrent inguinal hernia   TOTAL HIP ARTHROPLASTY Right 06/26/2014   Procedure: RIGHT TOTAL HIP ARTHROPLASTY ANTERIOR APPROACH  WITH ACETABULAR AUTOGRAFT;  Surgeon: Gearlean Alf, MD;  Location: [facility];  Service: Orthopedics;  Laterality: Right;     Past OB/GYN History: OB History  Gravida Para Term Preterm AB Living  38         12  SAB IAB Ectopic Multiple Live Births          12    # Outcome Date GA Lbr Len/2nd Weight Sex Delivery Anes PTL Lv  15 Gravida           14 Gravida           13 Gravida           12 Gravida           11 Gravida           10 Gravida           9 Gravida           8 Gravida           7 Gravida           6 Gravida           5 Gravida           4 Gravida           3 Gravida           2 Gravida           1 Gravida             Vaginal deliveries: 12 Menopausal: Denies vaginal bleeding since menopause, noticed some of the vaginal tissue bleeding due to prolapse.  Denies history of abnormal pap smears.   Medications: She has a current medication list which includes the following prescription(s): acetaminophen, aspirin, atorvastatin, vitamin d3, metoprolol succinate, multivitamin with minerals, omeprazole, warfarin, feeding supplement, gabapentin, and tramadol.   Allergies: Patient is allergic to lisinopril.   Social History:  Social History   Tobacco Use   Smoking status: Never   Smokeless tobacco: Never  Vaping Use   Vaping Use: Never used  Substance Use Topics   Alcohol use: No   Drug use: No    Relationship status: married She lives with husband and daughter.   She is not employed. Regular exercise: No History of abuse: No  Family History:   Family History  Problem Relation Age of Onset   Cancer Mother    Stroke Father      Review of Systems: Review of Systems  Constitutional:  Negative for fever, malaise/fatigue and weight loss.  Respiratory:  Negative for cough, shortness of breath and wheezing.   Cardiovascular:  Positive for palpitations. Negative for chest pain and leg swelling.  Gastrointestinal:  Negative for abdominal pain and blood in stool.   Genitourinary:  Negative for dysuria.  Musculoskeletal:  Negative for myalgias.  Skin:  Negative for rash.  Neurological:  Negative for dizziness and headaches.  Endo/Heme/Allergies:  Does not bruise/bleed easily.  Psychiatric/Behavioral:  Negative for depression. The patient is not nervous/anxious.     OBJECTIVE Physical Exam: Vitals:   11/23/21 1319  BP: 130/80  Pulse: 82  Weight: 122 lb (55.3 kg)  Height: 5' (1.524 m)    Physical Exam Constitutional:      General: She is not in acute distress. Pulmonary:     Effort: Pulmonary effort is normal.  Abdominal:     General: There is no distension.     Palpations: Abdomen is soft.     Tenderness: There is no abdominal tenderness. There is no rebound.  Musculoskeletal:        General: No swelling. Normal range of motion.  Skin:    General: Skin is warm and dry.     Findings: No rash.  Neurological:     Mental Status: She is alert and oriented to person, place, and time.  Psychiatric:        Mood and Affect: Mood normal.        Behavior: Behavior normal.     GU / Detailed Urogynecologic Evaluation:  Pelvic Exam: Normal external female genitalia; Bartholin's and Skene's glands normal in appearance; Urethral prolapse noted around catheter.   CST: negative  Complete procedentia noted Normal vaginal mucosa with atrophy, no abrasions present. Cervix normal appearance. Uterus normal single, nontender. Adnexa no mass, fullness, tenderness.     Pelvic floor strength I/V  Pelvic floor musculature: Right levator non-tender, Right obturator non-tender, Left levator non-tender, Left obturator non-tender  POP-Q:   POP-Q  3                                            Aa   9                                           Ba  9                                              C   9                                            Gh  3                                            Pb  9                                            tvl    3                                            Ap  9  Bp  -2.5                                              D   Pessary fitting: A 3in gellhorn pessary was placed but this was too large. A 2-3/4in gellhorn pessary was placed. It was comfortable, fit well, and stayed in placed with strong valsalva, walking and bending.  Lot # [id number] Exp 01/18/26   Rectal Exam:  Mild rectal prolapse noted    ASSESSMENT AND PLAN Ms. Nestor is a 76 y.o. with:  1. Uterovaginal prolapse, complete   2. Prolapse of anterior vaginal wall   3. Prolapse of posterior vaginal wall   4. Incomplete bladder emptying    Stage IV uterovaginal prolapse - 2-3/4 in gellhorn pessary placed today.  - She does not want surgery at this time due to history of recent stroke and I agree she is not a good surgical candidate. She has follow up with General surgery tomorrow and has been referred to [facility] in case of need for larger combined surgery.  - We discussed that if the pessary works for her then she should discuss voiding trial with [facility] as the catheter should not be worn long term. She already has significant urethral irritation and prolapse from the catheter.   Return 1 month for pessary check   Jaquita Folds, MD

## 2021-11-24 ENCOUNTER — Ambulatory Visit (INDEPENDENT_AMBULATORY_CARE_PROVIDER_SITE_OTHER): Payer: Medicare Other

## 2021-11-24 DIAGNOSIS — I4891 Unspecified atrial fibrillation: Secondary | ICD-10-CM

## 2021-11-24 DIAGNOSIS — N8110 Cystocele, unspecified: Secondary | ICD-10-CM | POA: Diagnosis not present

## 2021-11-24 DIAGNOSIS — I480 Paroxysmal atrial fibrillation: Secondary | ICD-10-CM | POA: Diagnosis not present

## 2021-11-24 DIAGNOSIS — Z978 Presence of other specified devices: Secondary | ICD-10-CM | POA: Diagnosis not present

## 2021-11-24 DIAGNOSIS — I10 Essential (primary) hypertension: Secondary | ICD-10-CM | POA: Diagnosis not present

## 2021-11-24 DIAGNOSIS — Z48815 Encounter for surgical aftercare following surgery on the digestive system: Secondary | ICD-10-CM | POA: Diagnosis not present

## 2021-11-24 DIAGNOSIS — Z8673 Personal history of transient ischemic attack (TIA), and cerebral infarction without residual deficits: Secondary | ICD-10-CM | POA: Diagnosis not present

## 2021-11-24 DIAGNOSIS — I8391 Asymptomatic varicose veins of right lower extremity: Secondary | ICD-10-CM | POA: Diagnosis not present

## 2021-11-24 DIAGNOSIS — K458 Other specified abdominal hernia without obstruction or gangrene: Secondary | ICD-10-CM | POA: Diagnosis not present

## 2021-11-24 DIAGNOSIS — Z5181 Encounter for therapeutic drug level monitoring: Secondary | ICD-10-CM

## 2021-11-24 DIAGNOSIS — I639 Cerebral infarction, unspecified: Secondary | ICD-10-CM

## 2021-11-24 DIAGNOSIS — Z09 Encounter for follow-up examination after completed treatment for conditions other than malignant neoplasm: Secondary | ICD-10-CM | POA: Diagnosis not present

## 2021-11-24 DIAGNOSIS — M5136 Other intervertebral disc degeneration, lumbar region: Secondary | ICD-10-CM | POA: Diagnosis not present

## 2021-11-24 DIAGNOSIS — N814 Uterovaginal prolapse, unspecified: Secondary | ICD-10-CM | POA: Diagnosis not present

## 2021-11-24 DIAGNOSIS — K4190 Unilateral femoral hernia, without obstruction or gangrene, not specified as recurrent: Secondary | ICD-10-CM | POA: Diagnosis not present

## 2021-11-24 DIAGNOSIS — M8580 Other specified disorders of bone density and structure, unspecified site: Secondary | ICD-10-CM | POA: Diagnosis not present

## 2021-11-24 LAB — POCT INR: INR: 2.3 (ref 2.0–3.0)

## 2021-11-24 NOTE — Patient Instructions (Signed)
Description   ?Continue on same dosage of Warfarin 1 tablet (5mg ) daily.  ?Recheck INR in 1 week.  ?Coumadin Clinic [phone] ?  ?  ?

## 2021-11-25 ENCOUNTER — Ambulatory Visit: Payer: Medicare Other | Admitting: Obstetrics and Gynecology

## 2021-11-27 ENCOUNTER — Ambulatory Visit: Payer: Medicare Other | Admitting: Obstetrics and Gynecology

## 2021-11-27 DIAGNOSIS — M8580 Other specified disorders of bone density and structure, unspecified site: Secondary | ICD-10-CM | POA: Diagnosis not present

## 2021-11-27 DIAGNOSIS — I480 Paroxysmal atrial fibrillation: Secondary | ICD-10-CM | POA: Diagnosis not present

## 2021-11-27 DIAGNOSIS — I10 Essential (primary) hypertension: Secondary | ICD-10-CM | POA: Diagnosis not present

## 2021-11-27 DIAGNOSIS — I8391 Asymptomatic varicose veins of right lower extremity: Secondary | ICD-10-CM | POA: Diagnosis not present

## 2021-11-27 DIAGNOSIS — Z48815 Encounter for surgical aftercare following surgery on the digestive system: Secondary | ICD-10-CM | POA: Diagnosis not present

## 2021-11-27 DIAGNOSIS — M5136 Other intervertebral disc degeneration, lumbar region: Secondary | ICD-10-CM | POA: Diagnosis not present

## 2021-11-30 ENCOUNTER — Other Ambulatory Visit: Payer: Self-pay

## 2021-11-30 ENCOUNTER — Ambulatory Visit (INDEPENDENT_AMBULATORY_CARE_PROVIDER_SITE_OTHER): Payer: Medicare Other

## 2021-11-30 DIAGNOSIS — I639 Cerebral infarction, unspecified: Secondary | ICD-10-CM | POA: Diagnosis not present

## 2021-11-30 DIAGNOSIS — I480 Paroxysmal atrial fibrillation: Secondary | ICD-10-CM

## 2021-11-30 DIAGNOSIS — I4891 Unspecified atrial fibrillation: Secondary | ICD-10-CM | POA: Diagnosis not present

## 2021-11-30 DIAGNOSIS — Z5181 Encounter for therapeutic drug level monitoring: Secondary | ICD-10-CM

## 2021-11-30 LAB — POCT INR: INR: 3.6 — AB (ref 2.0–3.0)

## 2021-11-30 NOTE — Patient Instructions (Signed)
Description   ?Skip today's dosage of Warfarin, then resume same dosage of Warfarin 1 tablet (5mg ) daily. Recheck INR in 2 weeks. Coumadin Clinic [phone] ?  ?  ?

## 2021-12-01 DIAGNOSIS — D72829 Elevated white blood cell count, unspecified: Secondary | ICD-10-CM | POA: Diagnosis not present

## 2021-12-02 DIAGNOSIS — M8580 Other specified disorders of bone density and structure, unspecified site: Secondary | ICD-10-CM | POA: Diagnosis not present

## 2021-12-02 DIAGNOSIS — I480 Paroxysmal atrial fibrillation: Secondary | ICD-10-CM | POA: Diagnosis not present

## 2021-12-02 DIAGNOSIS — M5136 Other intervertebral disc degeneration, lumbar region: Secondary | ICD-10-CM | POA: Diagnosis not present

## 2021-12-02 DIAGNOSIS — I8391 Asymptomatic varicose veins of right lower extremity: Secondary | ICD-10-CM | POA: Diagnosis not present

## 2021-12-02 DIAGNOSIS — Z48815 Encounter for surgical aftercare following surgery on the digestive system: Secondary | ICD-10-CM | POA: Diagnosis not present

## 2021-12-02 DIAGNOSIS — I10 Essential (primary) hypertension: Secondary | ICD-10-CM | POA: Diagnosis not present

## 2021-12-03 DIAGNOSIS — I480 Paroxysmal atrial fibrillation: Secondary | ICD-10-CM | POA: Diagnosis not present

## 2021-12-03 DIAGNOSIS — M8580 Other specified disorders of bone density and structure, unspecified site: Secondary | ICD-10-CM | POA: Diagnosis not present

## 2021-12-03 DIAGNOSIS — I8391 Asymptomatic varicose veins of right lower extremity: Secondary | ICD-10-CM | POA: Diagnosis not present

## 2021-12-03 DIAGNOSIS — Z48815 Encounter for surgical aftercare following surgery on the digestive system: Secondary | ICD-10-CM | POA: Diagnosis not present

## 2021-12-03 DIAGNOSIS — M5136 Other intervertebral disc degeneration, lumbar region: Secondary | ICD-10-CM | POA: Diagnosis not present

## 2021-12-03 DIAGNOSIS — I10 Essential (primary) hypertension: Secondary | ICD-10-CM | POA: Diagnosis not present

## 2021-12-07 DIAGNOSIS — M5136 Other intervertebral disc degeneration, lumbar region: Secondary | ICD-10-CM | POA: Diagnosis not present

## 2021-12-07 DIAGNOSIS — Z48815 Encounter for surgical aftercare following surgery on the digestive system: Secondary | ICD-10-CM | POA: Diagnosis not present

## 2021-12-07 DIAGNOSIS — I480 Paroxysmal atrial fibrillation: Secondary | ICD-10-CM | POA: Diagnosis not present

## 2021-12-07 DIAGNOSIS — I8391 Asymptomatic varicose veins of right lower extremity: Secondary | ICD-10-CM | POA: Diagnosis not present

## 2021-12-07 DIAGNOSIS — M8580 Other specified disorders of bone density and structure, unspecified site: Secondary | ICD-10-CM | POA: Diagnosis not present

## 2021-12-07 DIAGNOSIS — I10 Essential (primary) hypertension: Secondary | ICD-10-CM | POA: Diagnosis not present

## 2021-12-10 DIAGNOSIS — M8580 Other specified disorders of bone density and structure, unspecified site: Secondary | ICD-10-CM | POA: Diagnosis not present

## 2021-12-10 DIAGNOSIS — I8391 Asymptomatic varicose veins of right lower extremity: Secondary | ICD-10-CM | POA: Diagnosis not present

## 2021-12-10 DIAGNOSIS — I10 Essential (primary) hypertension: Secondary | ICD-10-CM | POA: Diagnosis not present

## 2021-12-10 DIAGNOSIS — M5136 Other intervertebral disc degeneration, lumbar region: Secondary | ICD-10-CM | POA: Diagnosis not present

## 2021-12-10 DIAGNOSIS — I480 Paroxysmal atrial fibrillation: Secondary | ICD-10-CM | POA: Diagnosis not present

## 2021-12-10 DIAGNOSIS — Z48815 Encounter for surgical aftercare following surgery on the digestive system: Secondary | ICD-10-CM | POA: Diagnosis not present

## 2021-12-14 ENCOUNTER — Ambulatory Visit (INDEPENDENT_AMBULATORY_CARE_PROVIDER_SITE_OTHER): Payer: Medicare Other

## 2021-12-14 DIAGNOSIS — Z5181 Encounter for therapeutic drug level monitoring: Secondary | ICD-10-CM

## 2021-12-14 DIAGNOSIS — I639 Cerebral infarction, unspecified: Secondary | ICD-10-CM

## 2021-12-14 DIAGNOSIS — I4891 Unspecified atrial fibrillation: Secondary | ICD-10-CM | POA: Diagnosis not present

## 2021-12-14 DIAGNOSIS — I480 Paroxysmal atrial fibrillation: Secondary | ICD-10-CM | POA: Diagnosis not present

## 2021-12-14 LAB — POCT INR: INR: 1.5 — AB (ref 2.0–3.0)

## 2021-12-14 NOTE — Patient Instructions (Signed)
Description   ?Take 1.5 tablets today and tomorrow, then resume same dosage of Warfarin 1 tablet (5mg ) daily. Recheck INR in 2 weeks. Coumadin Clinic [phone] ?  ?  ?

## 2021-12-17 DIAGNOSIS — M1990 Unspecified osteoarthritis, unspecified site: Secondary | ICD-10-CM | POA: Diagnosis not present

## 2021-12-17 DIAGNOSIS — D6869 Other thrombophilia: Secondary | ICD-10-CM | POA: Diagnosis not present

## 2021-12-17 DIAGNOSIS — F4320 Adjustment disorder, unspecified: Secondary | ICD-10-CM | POA: Diagnosis not present

## 2021-12-17 DIAGNOSIS — Z7982 Long term (current) use of aspirin: Secondary | ICD-10-CM | POA: Diagnosis not present

## 2021-12-17 DIAGNOSIS — I700 Atherosclerosis of aorta: Secondary | ICD-10-CM | POA: Diagnosis not present

## 2021-12-17 DIAGNOSIS — I480 Paroxysmal atrial fibrillation: Secondary | ICD-10-CM | POA: Diagnosis not present

## 2021-12-17 DIAGNOSIS — M5136 Other intervertebral disc degeneration, lumbar region: Secondary | ICD-10-CM | POA: Diagnosis not present

## 2021-12-17 DIAGNOSIS — Z8616 Personal history of COVID-19: Secondary | ICD-10-CM | POA: Diagnosis not present

## 2021-12-17 DIAGNOSIS — Z7901 Long term (current) use of anticoagulants: Secondary | ICD-10-CM | POA: Diagnosis not present

## 2021-12-17 DIAGNOSIS — Z48815 Encounter for surgical aftercare following surgery on the digestive system: Secondary | ICD-10-CM | POA: Diagnosis not present

## 2021-12-17 DIAGNOSIS — I8391 Asymptomatic varicose veins of right lower extremity: Secondary | ICD-10-CM | POA: Diagnosis not present

## 2021-12-17 DIAGNOSIS — Z8673 Personal history of transient ischemic attack (TIA), and cerebral infarction without residual deficits: Secondary | ICD-10-CM | POA: Diagnosis not present

## 2021-12-17 DIAGNOSIS — K219 Gastro-esophageal reflux disease without esophagitis: Secondary | ICD-10-CM | POA: Diagnosis not present

## 2021-12-17 DIAGNOSIS — I10 Essential (primary) hypertension: Secondary | ICD-10-CM | POA: Diagnosis not present

## 2021-12-17 DIAGNOSIS — E559 Vitamin D deficiency, unspecified: Secondary | ICD-10-CM | POA: Diagnosis not present

## 2021-12-17 DIAGNOSIS — M8580 Other specified disorders of bone density and structure, unspecified site: Secondary | ICD-10-CM | POA: Diagnosis not present

## 2021-12-17 DIAGNOSIS — E7800 Pure hypercholesterolemia, unspecified: Secondary | ICD-10-CM | POA: Diagnosis not present

## 2021-12-18 DIAGNOSIS — Z48815 Encounter for surgical aftercare following surgery on the digestive system: Secondary | ICD-10-CM | POA: Diagnosis not present

## 2021-12-18 DIAGNOSIS — I480 Paroxysmal atrial fibrillation: Secondary | ICD-10-CM | POA: Diagnosis not present

## 2021-12-18 DIAGNOSIS — I10 Essential (primary) hypertension: Secondary | ICD-10-CM | POA: Diagnosis not present

## 2021-12-18 DIAGNOSIS — M5136 Other intervertebral disc degeneration, lumbar region: Secondary | ICD-10-CM | POA: Diagnosis not present

## 2021-12-18 DIAGNOSIS — I8391 Asymptomatic varicose veins of right lower extremity: Secondary | ICD-10-CM | POA: Diagnosis not present

## 2021-12-18 DIAGNOSIS — M8580 Other specified disorders of bone density and structure, unspecified site: Secondary | ICD-10-CM | POA: Diagnosis not present

## 2021-12-21 ENCOUNTER — Other Ambulatory Visit: Payer: Self-pay | Admitting: Cardiovascular Disease

## 2021-12-21 DIAGNOSIS — I480 Paroxysmal atrial fibrillation: Secondary | ICD-10-CM

## 2021-12-22 DIAGNOSIS — R3914 Feeling of incomplete bladder emptying: Secondary | ICD-10-CM | POA: Diagnosis not present

## 2021-12-25 DIAGNOSIS — Z48815 Encounter for surgical aftercare following surgery on the digestive system: Secondary | ICD-10-CM | POA: Diagnosis not present

## 2021-12-25 DIAGNOSIS — I8391 Asymptomatic varicose veins of right lower extremity: Secondary | ICD-10-CM | POA: Diagnosis not present

## 2021-12-25 DIAGNOSIS — I480 Paroxysmal atrial fibrillation: Secondary | ICD-10-CM | POA: Diagnosis not present

## 2021-12-25 DIAGNOSIS — M8580 Other specified disorders of bone density and structure, unspecified site: Secondary | ICD-10-CM | POA: Diagnosis not present

## 2021-12-25 DIAGNOSIS — M5136 Other intervertebral disc degeneration, lumbar region: Secondary | ICD-10-CM | POA: Diagnosis not present

## 2021-12-25 DIAGNOSIS — I10 Essential (primary) hypertension: Secondary | ICD-10-CM | POA: Diagnosis not present

## 2021-12-28 ENCOUNTER — Ambulatory Visit (INDEPENDENT_AMBULATORY_CARE_PROVIDER_SITE_OTHER): Payer: Medicare Other | Admitting: *Deleted

## 2021-12-28 DIAGNOSIS — Z5181 Encounter for therapeutic drug level monitoring: Secondary | ICD-10-CM | POA: Diagnosis not present

## 2021-12-28 DIAGNOSIS — I4891 Unspecified atrial fibrillation: Secondary | ICD-10-CM | POA: Diagnosis not present

## 2021-12-28 DIAGNOSIS — I639 Cerebral infarction, unspecified: Secondary | ICD-10-CM

## 2021-12-28 DIAGNOSIS — I480 Paroxysmal atrial fibrillation: Secondary | ICD-10-CM

## 2021-12-28 LAB — POCT INR: INR: 1.8 — AB (ref 2.0–3.0)

## 2021-12-28 NOTE — Patient Instructions (Signed)
Description   ?Take 1.5 tablets today then start taking Warfarin 1 tablet (5mg ) daily except 1.5 tablets on Sundays. Recheck INR in 2 weeks. Coumadin Clinic [phone] ?  ?  ?

## 2021-12-29 DIAGNOSIS — M8580 Other specified disorders of bone density and structure, unspecified site: Secondary | ICD-10-CM | POA: Diagnosis not present

## 2021-12-29 DIAGNOSIS — Z48815 Encounter for surgical aftercare following surgery on the digestive system: Secondary | ICD-10-CM | POA: Diagnosis not present

## 2021-12-29 DIAGNOSIS — I480 Paroxysmal atrial fibrillation: Secondary | ICD-10-CM | POA: Diagnosis not present

## 2021-12-29 DIAGNOSIS — M5136 Other intervertebral disc degeneration, lumbar region: Secondary | ICD-10-CM | POA: Diagnosis not present

## 2021-12-29 DIAGNOSIS — I8391 Asymptomatic varicose veins of right lower extremity: Secondary | ICD-10-CM | POA: Diagnosis not present

## 2021-12-29 DIAGNOSIS — I10 Essential (primary) hypertension: Secondary | ICD-10-CM | POA: Diagnosis not present

## 2022-01-06 ENCOUNTER — Ambulatory Visit: Payer: Medicare Other | Admitting: Obstetrics and Gynecology

## 2022-01-07 ENCOUNTER — Ambulatory Visit (INDEPENDENT_AMBULATORY_CARE_PROVIDER_SITE_OTHER): Payer: Medicare Other | Admitting: Obstetrics and Gynecology

## 2022-01-07 ENCOUNTER — Encounter: Payer: Self-pay | Admitting: Obstetrics and Gynecology

## 2022-01-07 VITALS — BP 171/88 | HR 76

## 2022-01-07 DIAGNOSIS — N813 Complete uterovaginal prolapse: Secondary | ICD-10-CM | POA: Diagnosis not present

## 2022-01-07 DIAGNOSIS — N952 Postmenopausal atrophic vaginitis: Secondary | ICD-10-CM | POA: Diagnosis not present

## 2022-01-07 MED ORDER — ESTRADIOL 0.1 MG/GM VA CREA: 0.5000 g | TOPICAL_CREAM | VAGINAL | 11 refills | Status: DC

## 2022-01-07 NOTE — Progress Notes (Signed)
[facility] ? ? ?Subjective:  ?  ? ?Chief Complaint: No chief complaint on file. ? ?History of Present Illness: ?Hannah Downs is a 76 y.o. female with stage IV pelvic organ prolapse. At last visit, a size 2-3/4in long stem gellhorn pessary was placed. The pessary did not stay in very long and fell out at home. She still has catheter in place- being managed by [facility].  ? ?Past Medical History: ?Patient  has a past medical history of Arthritis, Dysrhythmia, GERD (gastroesophageal reflux disease), Hypercholesterolemia, Hypertension, Lumbar degenerative disc disease, Miscarriage, Osteopenia, Seasonal allergies, Stroke ([place]), Varicose vein, and Vitamin D deficiency.  ? ?Past Surgical History: ?She  has a past surgical history that includes Dilation and curettage of uterus (1998); Inguinal hernia repair (02/03/2007); Total hip arthroplasty (Right, 06/26/2014); Femoral hernia repair (11/02/2006); Robotic assisted diagnostic laparoscopy (09/18/2021); and Inguinal hernia repair (N/A, 11/04/2021).  ? ?Medications: ?She has a current medication list which includes the following prescription(s): acetaminophen, aspirin, atorvastatin, vitamin d3, estradiol, metoprolol succinate, multivitamin with minerals, omeprazole, warfarin, feeding supplement, gabapentin, and tramadol.  ? ?Allergies: ?Patient is allergic to lisinopril.  ? ?Social History: ?Patient  reports that she has never smoked. She has never used smokeless tobacco. She reports that she does not drink alcohol and does not use drugs.  ? ?  ? ?Objective:  ?  ?Physical Exam: ?BP (!) 171/88   Pulse 76  ?Gen: No apparent distress, A&O x 3. ?Detailed Urogynecologic Evaluation:  ?Pelvic Exam: Normal external female genitalia; Bartholin's and Skene's glands normal in appearance; Urethra with prolapse around catheter.  ? ?A 3in gellhorn was placed but was expelled partially with standing. A #5 cube pessary was placed, it fit well but was slightly  small. Fit with a #6 cube pessary (Lot# [id number], Exp 05/09/25). It was comfortable, fit well, and stayed in placed with strong cough, valsalva and bending.  ? ?POP-Q:  ?  ?POP-Q ?  ?3  ?                                          Aa   ?9 ?                                          Ba   ?9  ?                                            C  ?  ?9  ?                                          Gh   ?3  ?                                          Pb   ?9  ?  tvl  ?  ?3  ?                                          Ap   ?9  ?                                          Bp   ?-2.5  ?                                            D  ?  ? ?  ? ?Assessment/Plan:  ?  ?Assessment: ?Ms. Polcari is a 76 y.o. with stage IV pelvic organ prolapse  ? ?Plan: ?- Fit with a #6 cube pessary.  ?- Prescribed vaginal estrogen cream for atrophy- Estrace 0.5g twice a week. We discussed that there is minimal systemic absorption with vaginal preparation so would be low risk for blood clots.  ?- Will have her return in a few weeks. If pessary has stayed in place, will plan for voiding trial to remove catheter.  ? ?Jaquita Folds, MD ? ? ?  ? ? ?

## 2022-01-08 ENCOUNTER — Telehealth: Payer: Self-pay | Admitting: Obstetrics and Gynecology

## 2022-01-08 ENCOUNTER — Encounter: Payer: Self-pay | Admitting: Obstetrics and Gynecology

## 2022-01-08 ENCOUNTER — Ambulatory Visit (INDEPENDENT_AMBULATORY_CARE_PROVIDER_SITE_OTHER): Payer: Medicare Other | Admitting: Obstetrics and Gynecology

## 2022-01-08 VITALS — BP 137/89 | HR 88

## 2022-01-08 DIAGNOSIS — N393 Stress incontinence (female) (male): Secondary | ICD-10-CM

## 2022-01-08 DIAGNOSIS — N3941 Urge incontinence: Secondary | ICD-10-CM

## 2022-01-08 DIAGNOSIS — N813 Complete uterovaginal prolapse: Secondary | ICD-10-CM | POA: Diagnosis not present

## 2022-01-08 MED ORDER — MIRABEGRON ER 50 MG PO TB24: 50.0000 mg | ORAL_TABLET | Freq: Every day | ORAL | 5 refills | Status: DC

## 2022-01-08 NOTE — Progress Notes (Signed)
[facility] ?Return Visit ? ?SUBJECTIVE  ?History of Present Illness: ?REESA GIRDLEY is a 76 y.o. female seen in follow-up for prolapse. She had a #6 cube pessary placed. Since she has had the pessary placed, she has been leaking uncontrollably around the catheter. She does see that the catheter is draining well. But the pessary has stayed in place well, even with her being very active around the house.  ? ? ?Past Medical History: ?Patient  has a past medical history of Arthritis, Dysrhythmia, GERD (gastroesophageal reflux disease), Hypercholesterolemia, Hypertension, Lumbar degenerative disc disease, Miscarriage, Osteopenia, Seasonal allergies, Stroke ([place]), Varicose vein, and Vitamin D deficiency.  ? ?Past Surgical History: ?She  has a past surgical history that includes Dilation and curettage of uterus (1998); Inguinal hernia repair (02/03/2007); Total hip arthroplasty (Right, 06/26/2014); Femoral hernia repair (11/02/2006); Robotic assisted diagnostic laparoscopy (09/18/2021); and Inguinal hernia repair (N/A, 11/04/2021).  ? ?Medications: ?She has a current medication list which includes the following prescription(s): acetaminophen, aspirin, atorvastatin, vitamin d3, estradiol, feeding supplement, gabapentin, metoprolol succinate, multivitamin with minerals, omeprazole, tramadol, and warfarin.  ? ?Allergies: ?Patient is allergic to lisinopril.  ? ?Social History: ?Patient  reports that she has never smoked. She has never used smokeless tobacco. She reports that she does not drink alcohol and does not use drugs.  ?  ?  ?OBJECTIVE  ?  ? ?Physical Exam: ?Vitals:  ? 01/08/22 1452  ?BP: 137/89  ?Pulse: 88  ? ?Gen: No apparent distress, A&O x 3. ? ?Detailed Urogynecologic Evaluation:  ?Pessary noted to be in place. Palpated pessary and space still present between the pessary and the vaginal wall.  ?Prior exam showed: ?POP-Q (11/23/21):  ?  ?POP-Q ?  ?3  ?                                          Aa    ?9 ?                                          Ba   ?9  ?                                            C  ?  ?9  ?                                          Gh   ?3  ?                                          Pb   ?9  ?                                          tvl  ?  ?3  ?  Ap   ?9  ?                                          Bp   ?-2.5  ?                                            D  ?   ?Voidng trial performed: backfilled 327ml, pt voided on the way to commode and voided in hat 170ml. PVR with bladder scan was 0.  ? ?ASSESSMENT AND PLAN  ?  ?Ms. Bottari is a 76 y.o. with:  ?1. Uterovaginal prolapse, complete   ?2. Urge incontinence   ?3. SUI (stress urinary incontinence, female)   ? ?- We discussed that she previously had an obstruction from her advanced prolapse, which was relieved by the pessary, and then this can worsen the incontinence.  ?- Since she is leaking around the catheter, suspect she also has some urge incontinence. Passed voiding trial today and catheter removed. Prescribed myrbetriq 50mg  and provided samples.  ?- Will have her return in a week to follow up and ensure she is still emptying her bladder well without the catheter.  ? ?Jaquita Folds, MD ? ?Time spent: I spent 30 minutes dedicated to the care of this patient on the date of this encounter to include pre-visit review of records, face-to-face time with the patient  and post visit documentation and ordering medication/ testing.  ? ?

## 2022-01-08 NOTE — Telephone Encounter (Signed)
Pt was scheduled and seen

## 2022-01-11 ENCOUNTER — Ambulatory Visit (INDEPENDENT_AMBULATORY_CARE_PROVIDER_SITE_OTHER): Payer: Medicare Other | Admitting: *Deleted

## 2022-01-11 DIAGNOSIS — I480 Paroxysmal atrial fibrillation: Secondary | ICD-10-CM

## 2022-01-11 DIAGNOSIS — I639 Cerebral infarction, unspecified: Secondary | ICD-10-CM | POA: Diagnosis not present

## 2022-01-11 DIAGNOSIS — I4891 Unspecified atrial fibrillation: Secondary | ICD-10-CM

## 2022-01-11 DIAGNOSIS — Z5181 Encounter for therapeutic drug level monitoring: Secondary | ICD-10-CM | POA: Diagnosis not present

## 2022-01-11 LAB — POCT INR: INR: 1.5 — AB (ref 2.0–3.0)

## 2022-01-11 MED ORDER — WARFARIN SODIUM 5 MG PO TABS: ORAL_TABLET | ORAL | 1 refills | Status: DC

## 2022-01-11 NOTE — Patient Instructions (Addendum)
Description   ?Take 1.5 tablets today then start taking Warfarin 1 tablet (5mg ) daily except 1.5 tablets on Sundays, Tuesdays, and Thursdays. Recheck INR in 2 weeks. [facility] [phone] ?  ?  ? ?

## 2022-01-12 ENCOUNTER — Encounter: Payer: Self-pay | Admitting: Neurology

## 2022-01-12 ENCOUNTER — Ambulatory Visit (INDEPENDENT_AMBULATORY_CARE_PROVIDER_SITE_OTHER): Payer: Medicare Other | Admitting: Neurology

## 2022-01-12 VITALS — BP 143/88 | HR 66 | Ht 61.0 in | Wt 113.0 lb

## 2022-01-12 DIAGNOSIS — I639 Cerebral infarction, unspecified: Secondary | ICD-10-CM

## 2022-01-12 DIAGNOSIS — I4811 Longstanding persistent atrial fibrillation: Secondary | ICD-10-CM | POA: Diagnosis not present

## 2022-01-12 NOTE — Progress Notes (Addendum)
?[facility] ?[id number] ?[place]. [place] [id number] ?[phone] ? ?     OFFICE CONSULT NOTE ? ?Hannah Downs ?Date of Birth:  August 03, 1946 ?Medical Record Number:  [id number]  ? ?Referring MD: [name] ? ?Reason for Referral: Stroke ? ?HPI: Hannah Downs is a 76 year old Caucasian lady seen today for initial office consultation p visit for stroke.  History is obtained from the patient and review of electronic medical records and personally reviewed pertinent available imaging films in PACS.She has medical history significant for paroxysmal atrial fibrillation previously on Warfarin with recent transition to Eliquis, hyperlipidemia, essential hypertension, recent discharge from the hospital on 2/27 following a left femoral hernia incarcerated with small bowel causing obstruction s/p laparoscopic repair on 2/23. Prior to her hernia repair on 2/23 the patient's warfarin was reversed with vitamin K and Kcentra with development of right sided facial and right arm numbness on 2/24 with MRI evidence of left frontoparietal infarction. Patient was discharged on Eliquis with he first dose on 11/09/21 at 1300 and has remained compliant with this mediation regimen outpatient. On 2/28, patient woke up in her usual state of health at 06:30 and had a cup of coffee. She states that at around 08:30, she had an acute onset of dizziness but attributed this to her atrial fibrillation and states that it resolved briefly thereafter. She states that she was just feeling generally "off" and decided to go to the atrial fibrillation clinic for evaluation. While she was at the clinic at around 14:00, the patient states that dizziness "hit her like a ton of bricks" with associated nausea, vomiting, and imbalance.  She was not considered for thrombolysis due to having taken Eliquis.  CT scan of the head was performed which showed no acute abnormality only changes of small vessel disease.  MRI scan of the brain  showed a right posterior inferior cerebellar artery territory infarct.  There is no significant mass effect on the fourth ventricle or midline shift.  2D echo showed ejection fraction 6065% and no clot was noted.  LDL cholesterol 43 mg percent hemoglobin A1c was 5.4.  Patient had been on Eliquis for a few days prior to her second admission for stroke but she stated she could not afford it due to high co-pay and hence will switch back to warfarin.  She is tolerating warfarin well without bleeding or bruising but her INR has been quite fluctuating and she has to now go every 2 weeks to get it checked.  Patient is currently getting home health and physical and Occupational Therapy.  She can walk without a walker and states her balance is a lot improved.  Numbness is also resolved.  She has no new complaints. ? ?ROS:   ?14 system review of systems is positive for numbness, imbalance, walking difficulty, decreased hearing and all other systems negative ? ?PMH:  ?Past Medical History:  ?Diagnosis Date  ? Arthritis   ? Dysrhythmia   ? afib  ? GERD (gastroesophageal reflux disease)   ? Hypercholesterolemia   ? Hypertension   ? Lumbar degenerative disc disease   ? Miscarriage   ? times 3  ? Osteopenia   ? Seasonal allergies   ? Stroke [facility])   ? Varicose vein   ? right leg  ? Vitamin D deficiency   ? ? ?Social History:  ?Social History  ? ?Socioeconomic History  ? Marital status: Married  ?  Spouse name: Not on file  ? Number of children: Not  on file  ? Years of education: Not on file  ? Highest education level: Not on file  ?Occupational History  ? Not on file  ?Tobacco Use  ? Smoking status: Never  ? Smokeless tobacco: Never  ?Vaping Use  ? Vaping Use: Never used  ?Substance and Sexual Activity  ? Alcohol use: No  ? Drug use: No  ? Sexual activity: Not Currently  ?  Birth control/protection: Patch  ?Other Topics Concern  ? Not on file  ?Social History Narrative  ? Not on file  ? ?Social Determinants of Health  ? ?Financial  Resource Strain: Not on file  ?Food Insecurity: Not on file  ?Transportation Needs: Not on file  ?Physical Activity: Not on file  ?Stress: Not on file  ?Social Connections: Not on file  ?Intimate Partner Violence: Not on file  ? ? ?Medications:   ?Current Outpatient Medications on File Prior to Visit  ?Medication Sig Dispense Refill  ? acetaminophen (TYLENOL) 500 MG tablet Take 1,000 mg by mouth every 6 (six) hours as needed for moderate pain.    ? aspirin EC 81 MG EC tablet Take 1 tablet (81 mg total) by mouth daily. Swallow whole. 30 tablet 3  ? atorvastatin (LIPITOR) 20 MG tablet Take 1 tablet (20 mg total) by mouth daily. 90 tablet 3  ? Cholecalciferol (VITAMIN D3) 50 MCG (2000 UT) TABS Take 2,000 Units by mouth in the morning.    ? estradiol (ESTRACE) 0.1 MG/GM vaginal cream Place 0.5 g vaginally 2 (two) times a week. Place 0.5g nightly twice a week 30 g 11  ? metoprolol succinate (TOPROL-XL) 100 MG 24 hr tablet Take 1.5 tablets (150 mg total) by mouth daily. Take with or immediately following a meal. 135 tablet 3  ? mirabegron ER (MYRBETRIQ) 50 MG TB24 tablet Take 1 tablet (50 mg total) by mouth daily. 30 tablet 5  ? Multiple Vitamin (MULTIVITAMIN WITH MINERALS) TABS tablet Take 1 tablet by mouth daily.    ? omeprazole (PRILOSEC OTC) 20 MG tablet Take 20 mg by mouth daily.    ? warfarin (COUMADIN) 5 MG tablet Take 1 tablet by mouth daily except 1.5 tablets on Sundays, Tuesdays, and Thursdays or as directed by Anticoagulation Clinic. 40 tablet 1  ? ?No current facility-administered medications on file prior to visit.  ? ? ?Allergies:   ?Allergies  ?Allergen Reactions  ? Lisinopril Cough  ? ? ?Physical Exam ?General: Frail elderly Caucasian lady, seated, in no evident distress ?Head: head normocephalic and atraumatic.   ?Neck: supple with no carotid or supraclavicular bruits ?Cardiovascular: regular rate and rhythm, no murmurs ?Musculoskeletal: no deformity ?Skin:  no rash/petichiae ?Vascular:  Normal pulses all  extremities ? ?Neurologic Exam ?Mental Status: Awake and fully alert. Oriented to place and time. Recent and remote memory intact. Attention span, concentration and fund of knowledge appropriate. Mood and affect appropriate.  ?Cranial Nerves: Fundoscopic exam reveals sharp disc margins. Pupils equal, briskly reactive to light. Extraocular movements full without nystagmus. Visual fields full to confrontation. Hearing slightly diminished bilaterally.  t. Facial sensation intact. Face, tongue, palate moves normally and symmetrically.  ?Motor: Normal bulk and tone. Normal strength in all tested extremity muscles. ?Sensory.: intact to touch , pinprick , position and vibratory sensation.  ?Coordination: Rapid alternating movements normal in all extremities. Finger-to-nose and heel-to-shin performed accurately bilaterally. ?Gait and Station: Arises from chair without difficulty. Stance is normal. Gait demonstrates normal stride length and balance . Able to heel, toe and tandem walk with  moderate difficulty.  ?Reflexes: 1+ and symmetric. Toes downgoing.  ? ?NIHSS  0 ?Modified Rankin  2 ? ? 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  Vascular risk factors of hypertension, hyperlipidemia and atrial fibrillation ? ? ? ? ?PLAN: I had a long d/w patient about her recent strokes from atrial fibrillation, risk for recurrent stroke/TIAs, personally independently reviewed imaging studies and stroke evaluation results and answered questions.Continue warfarin daily with INR goal between 2 and 3 and she cannot afford Eliquis for secondary stroke prevention and maintain strict control of hypertension with blood pressure goal below 130/90, diabetes with hemoglobin A1c goal below 6.5% and lipids with LDL  cholesterol goal below 70 mg/dL. I also advised the patient to eat a healthy diet with plenty of whole grains, cereals, fruits and vegetables, exercise regularly and maintain ideal body weight Followup i

## 2022-01-12 NOTE — Patient Instructions (Signed)
I had a long d/w patient about her recent strokes from atrial fibrillation, risk for recurrent stroke/TIAs, personally independently reviewed imaging studies and stroke evaluation results and answered questions.Continue warfarin daily with INR goal between 2 and 3 and she cannot afford Eliquis for secondary stroke prevention and maintain strict control of hypertension with blood pressure goal below 130/90, diabetes with hemoglobin A1c goal below 6.5% and lipids with LDL cholesterol goal below 70 mg/dL. I also advised the patient to eat a healthy diet with plenty of whole grains, cereals, fruits and vegetables, exercise regularly and maintain ideal body weight Followup in the future with me in 3 months or call earlier if necessary. ?Stroke Prevention ?Some medical conditions and behaviors can lead to a higher chance of having a stroke. You can help prevent a stroke by eating healthy, exercising, not smoking, and managing any medical conditions you have. ?Stroke is a leading cause of functional impairment. Primary prevention is particularly important because a majority of strokes are first-time events. Stroke changes the lives of not only those who experience a stroke but also their family and other caregivers. ?How can this condition affect me? ?A stroke is a medical emergency and should be treated right away. A stroke can lead to brain damage and can sometimes be life-threatening. If a person gets medical treatment right away, there is a better chance of surviving and recovering from a stroke. ?What can increase my risk? ?The following medical conditions may increase your risk of a stroke: ?Cardiovascular disease. ?High blood pressure (hypertension). ?Diabetes. ?High cholesterol. ?Sickle cell disease. ?Blood clotting disorders (hypercoagulable state). ?Obesity. ?Sleep disorders (obstructive sleep apnea). ?Other risk factors include: ?Being older than age 84. ?Having a history of blood clots, stroke, or mini-stroke  (transient ischemic attack, TIA). ?Genetic factors, such as race, ethnicity, or a family history of stroke. ?Smoking cigarettes or using other tobacco products. ?Taking birth control pills, especially if you also use tobacco. ?Heavy use of alcohol or drugs, especially cocaine and methamphetamine. ?Physical inactivity. ?What actions can I take to prevent this? ?Manage your health conditions ?High cholesterol levels. ?Eating a healthy diet is important for preventing high cholesterol. If cholesterol cannot be managed through diet alone, you may need to take medicines. ?Take any prescribed medicines to control your cholesterol as told by your health care provider. ?Hypertension. ?To reduce your risk of stroke, try to keep your blood pressure below 130/80. ?Eating a healthy diet and exercising regularly are important for controlling blood pressure. If these steps are not enough to manage your blood pressure, you may need to take medicines. ?Take any prescribed medicines to control hypertension as told by your health care provider. ?Ask your health care provider if you should monitor your blood pressure at home. ?Have your blood pressure checked every year, even if your blood pressure is normal. Blood pressure increases with age and some medical conditions. ?Diabetes. ?Eating a healthy diet and exercising regularly are important parts of managing your blood sugar (glucose). If your blood sugar cannot be managed through diet and exercise, you may need to take medicines. ?Take any prescribed medicines to control your diabetes as told by your health care provider. ?Get evaluated for obstructive sleep apnea. Talk to your health care provider about getting a sleep evaluation if you snore a lot or have excessive sleepiness. ?Make sure that any other medical conditions you have, such as atrial fibrillation or atherosclerosis, are managed. ?Nutrition ?Follow instructions from your health care provider about what to eat or drink  to help manage your health condition. These instructions may include: ?Reducing your daily calorie intake. ?Limiting how much salt (sodium) you use to 1,500 milligrams (mg) each day. ?Using only healthy fats for cooking, such as olive oil, canola oil, or sunflower oil. ?Eating healthy foods. You can do this by: ?Choosing foods that are high in fiber, such as whole grains, and fresh fruits and vegetables. ?Eating at least 5 servings of fruits and vegetables a day. Try to fill one-half of your plate with fruits and vegetables at each meal. ?Choosing lean protein foods, such as lean cuts of meat, poultry without skin, fish, tofu, beans, and nuts. ?Eating low-fat dairy products. ?Avoiding foods that are high in sodium. This can help lower blood pressure. ?Avoiding foods that have saturated fat, trans fat, and cholesterol. This can help prevent high cholesterol. ?Avoiding processed and prepared foods. ?Counting your daily carbohydrate intake. ? ?Lifestyle ?If you drink alcohol: ?Limit how much you have to: ?0-1 drink a day for women who are not pregnant. ?0-2 drinks a day for men. ?Know how much alcohol is in your drink. In the [place] one drink equals one 12 oz bottle of beer (375mL), one 5 oz glass of wine (17mL), or one 1? oz glass of hard liquor (35mL). ?Do not use any products that contain nicotine or tobacco. These products include cigarettes, chewing tobacco, and vaping devices, such as e-cigarettes. If you need help quitting, ask your health care provider. ?Avoid secondhand smoke. ?Do not use drugs. ?Activity ? ?Try to stay at a healthy weight. ?Get at least 30 minutes of exercise on most days, such as: ?Fast walking. ?Biking. ?Swimming. ?Medicines ?Take over-the-counter and prescription medicines only as told by your health care provider. Aspirin or blood thinners (antiplatelets or anticoagulants) may be recommended to reduce your risk of forming blood clots that can lead to stroke. ?Avoid taking birth control  pills. Talk to your health care provider about the risks of taking birth control pills if: ?You are over 35 years old. ?You smoke. ?You get very bad headaches. ?You have had a blood clot. ?Where to find more information ?American Stroke Association: www.strokeassociation.org ?Get help right away if: ?You or a loved one has any symptoms of a stroke. "BE FAST" is an easy way to remember the main warning signs of a stroke: ?B - Balance. Signs are dizziness, sudden trouble walking, or loss of balance. ?E - Eyes. Signs are trouble seeing or a sudden change in vision. ?F - Face. Signs are sudden weakness or numbness of the face, or the face or eyelid drooping on one side. ?A - Arms. Signs are weakness or numbness in an arm. This happens suddenly and usually on one side of the body. ?S - Speech. Signs are sudden trouble speaking, slurred speech, or trouble understanding what people say. ?T - Time. Time to call emergency services. Write down what time symptoms started. ?You or a loved one has other signs of a stroke, such as: ?A sudden, severe headache with no known cause. ?Nausea or vomiting. ?Seizure. ?These symptoms may represent a serious problem that is an emergency. Do not wait to see if the symptoms will go away. Get medical help right away. Call your local emergency services (911 in the [place] Do not drive yourself to the hospital. ?Summary ?You can help to prevent a stroke by eating healthy, exercising, not smoking, limiting alcohol intake, and managing any medical conditions you may have. ?Do not use any products that contain nicotine or  tobacco. These include cigarettes, chewing tobacco, and vaping devices, such as e-cigarettes. If you need help quitting, ask your health care provider. ?Remember "BE FAST" for warning signs of a stroke. Get help right away if you or a loved one has any of these signs. ?This information is not intended to replace advice given to you by your health care provider. Make sure you  discuss any questions you have with your health care provider. ?Document Revised: 03/31/2020 Document Reviewed: 03/31/2020 ?Elsevier Patient Education ? [place]. ? ? ?

## 2022-01-13 ENCOUNTER — Encounter: Payer: Self-pay | Admitting: Obstetrics and Gynecology

## 2022-01-13 ENCOUNTER — Ambulatory Visit (INDEPENDENT_AMBULATORY_CARE_PROVIDER_SITE_OTHER): Payer: Medicare Other | Admitting: Obstetrics and Gynecology

## 2022-01-13 VITALS — BP 162/109 | HR 76

## 2022-01-13 DIAGNOSIS — N3941 Urge incontinence: Secondary | ICD-10-CM | POA: Diagnosis not present

## 2022-01-13 DIAGNOSIS — R3915 Urgency of urination: Secondary | ICD-10-CM | POA: Diagnosis not present

## 2022-01-13 DIAGNOSIS — N813 Complete uterovaginal prolapse: Secondary | ICD-10-CM

## 2022-01-13 MED ORDER — TROSPIUM CHLORIDE 20 MG PO TABS: 20.0000 mg | ORAL_TABLET | Freq: Two times a day (BID) | ORAL | 5 refills | Status: DC

## 2022-01-13 NOTE — Progress Notes (Signed)
[facility] ?Return Visit ? ?SUBJECTIVE  ?History of Present Illness: ?Hannah Downs is a 76 y.o. female seen in follow-up for prolapse. She has a #6 cube pessary placed.  ?The pessary has been working well. She is using vaginal estrogen twice a week. Also using vitamin E and coconut oil for vaginal moisture during the day.  ? ?She has been voiding well. Leakage has been a lot less during the day while on the Myrbetriq. At night, she has been having some leakage and still needing to use a pad. She does not have prescription coverage and cannot afford the cost of the myrbetriq.  ? ?Past Medical History: ?Patient  has a past medical history of Arthritis, Dysrhythmia, GERD (gastroesophageal reflux disease), Hypercholesterolemia, Hypertension, Lumbar degenerative disc disease, Miscarriage, Osteopenia, Seasonal allergies, Stroke ([place]), Varicose vein, and Vitamin D deficiency.  ? ?Past Surgical History: ?She  has a past surgical history that includes Dilation and curettage of uterus (1998); Inguinal hernia repair (02/03/2007); Total hip arthroplasty (Right, 06/26/2014); Femoral hernia repair (11/02/2006); Robotic assisted diagnostic laparoscopy (09/18/2021); and Inguinal hernia repair (N/A, 11/04/2021).  ? ?Medications: ?She has a current medication list which includes the following prescription(s): trospium, acetaminophen, aspirin, atorvastatin, vitamin d3, estradiol, metoprolol succinate, mirabegron er, multivitamin with minerals, omeprazole, and warfarin.  ? ?Allergies: ?Patient is allergic to lisinopril.  ? ?Social History: ?Patient  reports that she has never smoked. She has never used smokeless tobacco. She reports that she does not drink alcohol and does not use drugs.  ?  ?  ?OBJECTIVE  ?  ? ?Physical Exam: ?Vitals:  ? 01/13/22 1028  ?BP: (!) 162/109  ?Pulse: 76  ? ?Gen: No apparent distress, A&O x 3. ? ?Detailed Urogynecologic Evaluation:  ?Pessary noted to be in place. Pessary removed and  cleaned. Speculum exam revealed no lesions on the vagina. Pessary replaced and fit well.  ? ?Prior exam showed: ?POP-Q (11/23/21):  ?  ?POP-Q ?  ?3  ?                                          Aa   ?9 ?                                          Ba   ?9  ?                                            C  ?  ?9  ?                                          Gh   ?3  ?                                          Pb   ?9  ?  tvl  ?  ?3  ?                                          Ap   ?9  ?                                          Bp   ?-2.5  ?                                            D  ?   ? ? ?ASSESSMENT AND PLAN  ?  ?Hannah Downs is a 76 y.o. with:  ?1. Uterovaginal prolapse, complete   ?2. Urinary urgency   ?3. Urge incontinence   ? ?- Continue with #6 cube pessary. Follow up q3 months for pessary cleanings.  ?- She can complete samples of myrbetriq but since she does not have Rx insurance coverage she will not be able to afford the cost. Prescribed Trospium 20mg  BID instead, which she can get with good Rx.  ? ?Return 3 months for pessary check or sooner if needed ? ?Jaquita Folds, MD ? ? ?

## 2022-01-14 DIAGNOSIS — Z48815 Encounter for surgical aftercare following surgery on the digestive system: Secondary | ICD-10-CM | POA: Diagnosis not present

## 2022-01-14 DIAGNOSIS — I10 Essential (primary) hypertension: Secondary | ICD-10-CM | POA: Diagnosis not present

## 2022-01-14 DIAGNOSIS — M8580 Other specified disorders of bone density and structure, unspecified site: Secondary | ICD-10-CM | POA: Diagnosis not present

## 2022-01-14 DIAGNOSIS — I8391 Asymptomatic varicose veins of right lower extremity: Secondary | ICD-10-CM | POA: Diagnosis not present

## 2022-01-14 DIAGNOSIS — I480 Paroxysmal atrial fibrillation: Secondary | ICD-10-CM | POA: Diagnosis not present

## 2022-01-14 DIAGNOSIS — M5136 Other intervertebral disc degeneration, lumbar region: Secondary | ICD-10-CM | POA: Diagnosis not present

## 2022-01-25 ENCOUNTER — Ambulatory Visit (INDEPENDENT_AMBULATORY_CARE_PROVIDER_SITE_OTHER): Payer: Medicare Other

## 2022-01-25 DIAGNOSIS — I480 Paroxysmal atrial fibrillation: Secondary | ICD-10-CM | POA: Diagnosis not present

## 2022-01-25 DIAGNOSIS — Z5181 Encounter for therapeutic drug level monitoring: Secondary | ICD-10-CM

## 2022-01-25 DIAGNOSIS — I4891 Unspecified atrial fibrillation: Secondary | ICD-10-CM

## 2022-01-25 LAB — POCT INR: INR: 1.5 — AB (ref 2.0–3.0)

## 2022-01-25 NOTE — Patient Instructions (Signed)
Description   ?Take 1.5 tablets today then START taking Warfarin 1.5 tablets daily except 1 tablet on Tuesdays and Fridays.  ?Recheck INR in 2 weeks.  ?Coumadin Clinic [phone] ?  ?   ?

## 2022-02-03 ENCOUNTER — Ambulatory Visit: Payer: Medicare Other | Admitting: Obstetrics and Gynecology

## 2022-02-08 NOTE — Progress Notes (Unsigned)
Cardiology Office Note:    Date:  02/09/2022   ID:  Hannah Downs, DOB 13-Sep-1946, MRN [id number]  PCP:  [name], MD  [facility] Providers Cardiologist:  Kristeen Miss, MD Cardiology APP:  Kennon Rounds    Referring MD: [name], MD   Chief Complaint:  F/u for AFib    Patient Profile: Paroxysmal atrial fibrillation Echo 7/21: EF 55-60, Gr 2 DD, RVSP 38.3, mild BAE Echo 10/2021: EF 60-65, Gr 2 DD, normal RVSF, mild BAE, trivial MR Hypertension Hyperlipidemia Aortic Atherosclerosis GERD Hx of CVA (Feb 2023, March 2023) S/p urgent hernia repair Feb 2023 (Incarcerated L Fem) C/b AF w RVR and periop stroke  Prior CV Studies: ECHO COMPLETE WO IMAGING ENHANCING AGENT 11/08/2021 EF 60-65, no RWMA, GRII DD, normal RVSF, mild BAE, trivial MR    Echocardiogram 04/01/2020 EF 55-60, no RWMA, mild LVH, GRII DD, normal RVSF, mildly elevated PASP, RVSP 38.3, mild BAE, trivial MR, mild-moderate TR  LONG TERM MONITOR (8-14 DAYS) HOOK UP AND INTERPRETATION 03/26/2020 Narrative Cardiac monitor showed paroxysmal atrial fibrillation ( less than 1% burden) with HR 92-142bpm. Atrial fibrillation was associated with patient triggered events.  History of Present Illness:   Hannah Downs is a 76 y.o. female with the above problem list.  She was admitted in February 2023 with an incarcerated left femoral hernia c/b SBO requiring urgent surgery.  She required reversal of her Coumadin.  Her course was complicated by atrial fibrillation with rapid ventricular rate and perioperative stroke.   She had follow-up with Dr. Elease Hashimoto in February 2023 after her recent admission.  She had been transition to apixaban during her admission but was having some difficulty affording this.  Our office was looking into assistance.  After her visit, she started having issues with dizziness and presented to the emergency room.  She was ultimately admitted 3/1-3/3 with recurrent stroke.  MRI was  notable for subacute infarct left frontal lobe and acute/subacute infarct left parietal lobe.  Her apixaban was transition back to warfarin.  She was also started on aspirin.  She returns for follow-up.  She is here with her husband.  She has been doing well.  She completed home health physical therapy several weeks ago.  She does have some issues with balance but has not fallen.  She still has occasional dizziness.  Overall, this is improved.  She has not had chest pain, shortness of breath, syncope, significant leg edema.    Past Medical History:  Diagnosis Date   Arthritis    Dysrhythmia    afib   GERD (gastroesophageal reflux disease)    Hypercholesterolemia    Hypertension    Lumbar degenerative disc disease    Miscarriage    times 3   Osteopenia    Seasonal allergies    Stroke (HCC)    Varicose vein    right leg   Vitamin D deficiency    Current Medications: Current Meds  Medication Sig   acetaminophen (TYLENOL) 500 MG tablet Take 1,000 mg by mouth every 6 (six) hours as needed for moderate pain.   aspirin EC 81 MG EC tablet Take 1 tablet (81 mg total) by mouth daily. Swallow whole.   atorvastatin (LIPITOR) 20 MG tablet Take 1 tablet (20 mg total) by mouth daily.   Cholecalciferol (VITAMIN D3) 50 MCG (2000 UT) TABS Take 2,000 Units by mouth in the morning.   estradiol (ESTRACE) 0.1 MG/GM vaginal cream Place 0.5 g vaginally 2 (two) times a  week. Place 0.5g nightly twice a week   metoprolol succinate (TOPROL-XL) 100 MG 24 hr tablet Take 1.5 tablets (150 mg total) by mouth daily. Take with or immediately following a meal.   omeprazole (PRILOSEC OTC) 20 MG tablet Take 20 mg by mouth daily.   trospium (SANCTURA) 20 MG tablet Take 1 tablet (20 mg total) by mouth 2 (two) times daily.   warfarin (COUMADIN) 5 MG tablet Take 1 tablet by mouth daily except 1.5 tablets on Sundays, Tuesdays, and Thursdays or as directed by Anticoagulation Clinic.    Allergies:   Lisinopril   Social  History   Tobacco Use   Smoking status: Never   Smokeless tobacco: Never  Vaping Use   Vaping Use: Never used  Substance Use Topics   Alcohol use: No   Drug use: No    Family Hx: The patient's family history includes Cancer in her mother; Stroke in her father.  Review of Systems  Gastrointestinal:  Negative for hematochezia.  Genitourinary:  Negative for hematuria.    EKGs/Labs/Other Test Reviewed:    EKG:  EKG is not ordered today.  The ekg ordered today demonstrates n/a  Recent Labs: 11/08/2021: Magnesium 1.7 11/11/2021: Hemoglobin 10.4; Platelets 312 11/12/2021: ALT 77 11/13/2021: BUN 5; Creatinine, Ser 0.45; Potassium 3.6; Sodium 135   Recent Lipid Panel Recent Labs    11/08/21 0206  CHOL 109  TRIG 55  HDL 55  VLDL 11  LDLCALC 43     Risk Assessment/Calculations:    CHA2DS2-VASc Score = 7   This indicates a 11.2% annual risk of stroke. The patient's score is based upon: CHF History: 0 HTN History: 1 Diabetes History: 0 Stroke History: 2 Vascular Disease History: 1 Age Score: 2 Gender Score: 1        Physical Exam:    VS:  BP (!) 154/90   Pulse 68   Ht 5\' 1"  (1.549 m)   Wt 113 lb 9.6 oz (51.5 kg)   SpO2 92%   BMI 21.46 kg/m     Wt Readings from Last 3 Encounters:  02/09/22 113 lb 9.6 oz (51.5 kg)  01/12/22 113 lb (51.3 kg)  11/23/21 122 lb (55.3 kg)    Constitutional:      Appearance: Healthy appearance. Not in distress.  Neck:     Vascular: JVD normal.  Pulmonary:     Effort: Pulmonary effort is normal.     Breath sounds: No wheezing. No rales.  Cardiovascular:     Normal rate. Regular rhythm. Normal S1. Normal S2.      Murmurs: There is no murmur.  Edema:    Peripheral edema present.    Ankle: bilateral trace edema of the ankle. Abdominal:     Palpations: Abdomen is soft.  Skin:    General: Skin is warm and dry.  Neurological:     Mental Status: Alert and oriented to person, place and time.     Cranial Nerves: Cranial nerves are  intact.        ASSESSMENT & PLAN:   Aortic atherosclerosis (HCC) Continue ASA, statin Rx.  Essential hypertension BP above goal.  She notes a hx of "white coat HTN".  She notes her BPs at home are usually good.  She was taken off of HCTZ in the hospital. It looks like she has low Na.  I have asked her to monitor her BP for 2 weeks and send readings.  Continue Toprol XL 150 mg once daily.  If BP remains above  goal, consider addition of ACE/ARB.  Hyperlipidemia She notes she has been on the same dose of Lipitor for years.  LDL optimal in Feb 2023.  Continue Lipitor 20 mg once daily.   Paroxysmal atrial fibrillation (HCC) She had perioperative stroke in Feb and then again in March.  She was switched back to Coumadin and has been doing well since.  She has paroxysms of atrial fibrillation.  This is overall stable.  She will f/u with the coumadin clinic today for monitoring. Continue Toprol XL 150 mg once daily. F/u 6 mos.   Acquired thrombophilia (HCC) She continues on Coumadin which is managed in our Coumadin clinic.             Dispo:  Return in about 6 months (around 08/12/2022) for Routine Follow Up, w/ Dr. Elease Hashimoto.   Medication Adjustments/Labs and Tests Ordered: Current medicines are reviewed at length with the patient today.  Concerns regarding medicines are outlined above.  Tests Ordered: No orders of the defined types were placed in this encounter.  Medication Changes: No orders of the defined types were placed in this encounter.  Signed, Tereso Newcomer, PA-C  02/09/2022 10:13 AM    [facility] [address] [place], [place], [place]  [address] Phone: [phone]; Fax: [phone]

## 2022-02-09 ENCOUNTER — Encounter: Payer: Self-pay | Admitting: Physician Assistant

## 2022-02-09 ENCOUNTER — Ambulatory Visit (INDEPENDENT_AMBULATORY_CARE_PROVIDER_SITE_OTHER): Payer: Medicare Other | Admitting: Physician Assistant

## 2022-02-09 ENCOUNTER — Ambulatory Visit (INDEPENDENT_AMBULATORY_CARE_PROVIDER_SITE_OTHER): Payer: Medicare Other | Admitting: *Deleted

## 2022-02-09 VITALS — BP 154/90 | HR 68 | Ht 61.0 in | Wt 113.6 lb

## 2022-02-09 DIAGNOSIS — I639 Cerebral infarction, unspecified: Secondary | ICD-10-CM

## 2022-02-09 DIAGNOSIS — D6869 Other thrombophilia: Secondary | ICD-10-CM | POA: Diagnosis not present

## 2022-02-09 DIAGNOSIS — I700 Atherosclerosis of aorta: Secondary | ICD-10-CM

## 2022-02-09 DIAGNOSIS — I4891 Unspecified atrial fibrillation: Secondary | ICD-10-CM | POA: Diagnosis not present

## 2022-02-09 DIAGNOSIS — I480 Paroxysmal atrial fibrillation: Secondary | ICD-10-CM | POA: Diagnosis not present

## 2022-02-09 DIAGNOSIS — E7800 Pure hypercholesterolemia, unspecified: Secondary | ICD-10-CM

## 2022-02-09 DIAGNOSIS — Z5181 Encounter for therapeutic drug level monitoring: Secondary | ICD-10-CM | POA: Diagnosis not present

## 2022-02-09 DIAGNOSIS — I10 Essential (primary) hypertension: Secondary | ICD-10-CM

## 2022-02-09 LAB — POCT INR: INR: 2.2 (ref 2.0–3.0)

## 2022-02-09 NOTE — Assessment & Plan Note (Signed)
She had perioperative stroke in Feb and then again in March.  She was switched back to Coumadin and has been doing well since.  She has paroxysms of atrial fibrillation.  This is overall stable.  She will f/u with the coumadin clinic today for monitoring. Continue Toprol XL 150 mg once daily. F/u 6 mos.

## 2022-02-09 NOTE — Assessment & Plan Note (Signed)
She continues on Coumadin which is managed in our Coumadin clinic.

## 2022-02-09 NOTE — Assessment & Plan Note (Signed)
BP above goal.  She notes a hx of "white coat HTN".  She notes her BPs at home are usually good.  She was taken off of HCTZ in the hospital. It looks like she has low Na.  I have asked her to monitor her BP for 2 weeks and send readings.  Continue Toprol XL 150 mg once daily.  If BP remains above goal, consider addition of ACE/ARB.

## 2022-02-09 NOTE — Patient Instructions (Addendum)
Medication Instructions:  Your physician recommends that you continue on your current medications as directed. Please refer to the Current Medication list given to you today.  *If you need a refill on your cardiac medications before your next appointment, please call your pharmacy*   Lab Work: None ordered  If you have labs (blood work) drawn today and your tests are completely normal, you will receive your results only by: MyChart Message (if you have MyChart) OR A paper copy in the mail If you have any lab test that is abnormal or we need to change your treatment, we will call you to review the results.   Testing/Procedures: None ordered   Follow-Up: At [facility], you and your health needs are our priority.  As part of our continuing mission to provide you with exceptional heart care, we have created designated Provider Care Teams.  These Care Teams include your primary Cardiologist (physician) and Advanced Practice Providers (APPs -  Physician Assistants and Nurse Practitioners) who all work together to provide you with the care you need, when you need it.  We recommend signing up for the patient portal called "MyChart".  Sign up information is provided on this After Visit Summary.  MyChart is used to connect with patients for Virtual Visits (Telemedicine).  Patients are able to view lab/test results, encounter notes, upcoming appointments, etc.  Non-urgent messages can be sent to your provider as well.   To learn more about what you can do with MyChart, go to [email or web address].    Your next appointment:   6 month(s)  The format for your next appointment:   In Person  Provider:   Kristeen Miss, MD           Other Instructions Your mychart log in information:  username:  [email or web address] Password:  [email or web address]  Your physician has requested that you regularly monitor and record your blood pressure readings at home. Please use the same machine at the same time  of day to check your readings and record them to bring to your follow-up visit.   Please monitor blood pressures and keep a log of your readings and send in a mychart message with those readings.    Make sure to check 2 hours after your medications.    AVOID these things for 30 minutes before checking your blood pressure: No Drinking caffeine. No Drinking alcohol. No Eating. No Smoking. No Exercising.   Five minutes before checking your blood pressure: Pee. Sit in a dining chair. Avoid sitting in a soft couch or armchair. Be quiet. Do not talk   Important Information About Sugar

## 2022-02-09 NOTE — Patient Instructions (Signed)
Description   Continue taking Warfarin 1.5 tablets daily except 1 tablet on Tuesdays and Fridays.  Recheck INR in 2 weeks.  Coumadin Clinic [phone]

## 2022-02-09 NOTE — Assessment & Plan Note (Signed)
She notes she has been on the same dose of Lipitor for years.  LDL optimal in Feb 2023.  Continue Lipitor 20 mg once daily.

## 2022-02-09 NOTE — Assessment & Plan Note (Signed)
Continue ASA, statin Rx.  

## 2022-02-18 DIAGNOSIS — R634 Abnormal weight loss: Secondary | ICD-10-CM | POA: Diagnosis not present

## 2022-02-18 DIAGNOSIS — D6869 Other thrombophilia: Secondary | ICD-10-CM | POA: Diagnosis not present

## 2022-02-18 DIAGNOSIS — D72829 Elevated white blood cell count, unspecified: Secondary | ICD-10-CM | POA: Diagnosis not present

## 2022-02-18 DIAGNOSIS — Z8673 Personal history of transient ischemic attack (TIA), and cerebral infarction without residual deficits: Secondary | ICD-10-CM | POA: Diagnosis not present

## 2022-02-18 DIAGNOSIS — E559 Vitamin D deficiency, unspecified: Secondary | ICD-10-CM | POA: Diagnosis not present

## 2022-02-18 DIAGNOSIS — E785 Hyperlipidemia, unspecified: Secondary | ICD-10-CM | POA: Diagnosis not present

## 2022-02-18 DIAGNOSIS — I480 Paroxysmal atrial fibrillation: Secondary | ICD-10-CM | POA: Diagnosis not present

## 2022-03-01 ENCOUNTER — Ambulatory Visit: Payer: Medicare Other

## 2022-03-01 ENCOUNTER — Telehealth: Payer: Self-pay | Admitting: Neurology

## 2022-03-01 DIAGNOSIS — Z5181 Encounter for therapeutic drug level monitoring: Secondary | ICD-10-CM | POA: Diagnosis not present

## 2022-03-01 DIAGNOSIS — I480 Paroxysmal atrial fibrillation: Secondary | ICD-10-CM

## 2022-03-01 DIAGNOSIS — I4891 Unspecified atrial fibrillation: Secondary | ICD-10-CM | POA: Diagnosis not present

## 2022-03-01 LAB — POCT INR: INR: 1.7 — AB (ref 2.0–3.0)

## 2022-03-01 NOTE — Telephone Encounter (Signed)
8/21 appointment rescheduled with pt over the phone - MD out

## 2022-03-01 NOTE — Patient Instructions (Signed)
TAKE 2 TABLETS TODAY and then INCREASE TO 1.5 tablets daily except 1 tablet on Fridays.  Recheck INR in 3 weeks.  Coumadin Clinic [phone]

## 2022-03-04 ENCOUNTER — Other Ambulatory Visit: Payer: Self-pay | Admitting: *Deleted

## 2022-03-04 DIAGNOSIS — I480 Paroxysmal atrial fibrillation: Secondary | ICD-10-CM

## 2022-03-04 MED ORDER — WARFARIN SODIUM 5 MG PO TABS: ORAL_TABLET | ORAL | 1 refills | Status: DC

## 2022-03-23 ENCOUNTER — Ambulatory Visit: Payer: Medicare Other

## 2022-03-23 DIAGNOSIS — I4891 Unspecified atrial fibrillation: Secondary | ICD-10-CM | POA: Diagnosis not present

## 2022-03-23 DIAGNOSIS — I480 Paroxysmal atrial fibrillation: Secondary | ICD-10-CM | POA: Diagnosis not present

## 2022-03-23 DIAGNOSIS — Z5181 Encounter for therapeutic drug level monitoring: Secondary | ICD-10-CM | POA: Diagnosis not present

## 2022-03-23 DIAGNOSIS — I639 Cerebral infarction, unspecified: Secondary | ICD-10-CM

## 2022-03-23 LAB — POCT INR: INR: 2.4 (ref 2.0–3.0)

## 2022-03-23 NOTE — Patient Instructions (Signed)
Description   Continue on same dosage 1.5 tablets daily except 1 tablet on Fridays. Recheck INR in 4 weeks. Coumadin Clinic [phone]

## 2022-04-14 ENCOUNTER — Encounter: Payer: Self-pay | Admitting: Obstetrics and Gynecology

## 2022-04-14 ENCOUNTER — Ambulatory Visit (INDEPENDENT_AMBULATORY_CARE_PROVIDER_SITE_OTHER): Payer: Medicare Other | Admitting: Obstetrics and Gynecology

## 2022-04-14 VITALS — BP 149/90 | HR 75

## 2022-04-14 DIAGNOSIS — N3941 Urge incontinence: Secondary | ICD-10-CM

## 2022-04-14 DIAGNOSIS — N813 Complete uterovaginal prolapse: Secondary | ICD-10-CM

## 2022-04-14 NOTE — Progress Notes (Addendum)
[facility] Return Visit  SUBJECTIVE  History of Present Illness: TYHESHA DUTSON is a 76 y.o. female seen in follow-up for prolapse. She has a #6 cube pessary. The pessary has been working well. Denies vaginal bleeding She is using vaginal estrogen twice a week. Also using vitamin E and coconut oil for vaginal moisture during the day.   Trospium has been working well for her. She feels less urgency throughout the day and can go less often. Still has some leakage.   Past Medical History: Patient  has a past medical history of Arthritis, Dysrhythmia, GERD (gastroesophageal reflux disease), Hypercholesterolemia, Hypertension, Lumbar degenerative disc disease, Miscarriage, Osteopenia, Seasonal allergies, Stroke (HCC), Varicose vein, and Vitamin D deficiency.   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   Medications: She has a current medication list which includes the following prescription(s): acetaminophen, atorvastatin, vitamin d3, estradiol, metoprolol succinate, omeprazole, trospium, and warfarin.   Allergies: Patient is allergic to lisinopril.   Social History: Patient  reports that she has never smoked. She has never used smokeless tobacco. She reports that she does not drink alcohol and does not use drugs.      OBJECTIVE     Physical Exam: Vitals:   04/14/22 1033  BP: (!) 149/90  Pulse: 75    Gen: No apparent distress, A&O x 3.  Detailed Urogynecologic Evaluation:  Pessary noted to be in place. Pessary removed and cleaned. Speculum exam revealed no lesions on the vagina. Pessary replaced with a #6 cube with drainage holes and fit well. [id number]  Prior exam showed: POP-Q  (11/23/21):    POP-Q   3                                            Aa   9                                           Ba   9                                              C    9                                            Gh   3                                            Pb   9                                            tvl    3  Ap   9                                            Bp   -2.5                                              D       ASSESSMENT AND PLAN    Ms. Stockdale is a 76 y.o. with:  1. Uterovaginal prolapse, complete   2. Urge incontinence     - Continue with #6 cube pessary (replaced today with one with drainage holes). Follow up q3 months for pessary cleanings.  - Continue with Trospium 20mg  BID.   Return 3 months for pessary check or sooner if needed  , MD  Time spent: I spent 20 minutes dedicated to the care of this patient on the date of this encounter to include pre-visit review of records, face-to-face time with the patient and post visit documentation.

## 2022-04-20 ENCOUNTER — Ambulatory Visit: Payer: Medicare Other | Admitting: *Deleted

## 2022-04-20 DIAGNOSIS — I639 Cerebral infarction, unspecified: Secondary | ICD-10-CM | POA: Diagnosis not present

## 2022-04-20 DIAGNOSIS — Z5181 Encounter for therapeutic drug level monitoring: Secondary | ICD-10-CM

## 2022-04-20 DIAGNOSIS — I480 Paroxysmal atrial fibrillation: Secondary | ICD-10-CM | POA: Diagnosis not present

## 2022-04-20 DIAGNOSIS — I4891 Unspecified atrial fibrillation: Secondary | ICD-10-CM | POA: Diagnosis not present

## 2022-04-20 LAB — POCT INR: INR: 2.4 (ref 2.0–3.0)

## 2022-04-20 NOTE — Patient Instructions (Signed)
Description   Continue taking 1.5 tablets daily except 1 tablet on Fridays. Recheck INR in 5 weeks. Coumadin Clinic [phone]

## 2022-05-02 ENCOUNTER — Other Ambulatory Visit: Payer: Self-pay | Admitting: Cardiovascular Disease

## 2022-05-02 DIAGNOSIS — I480 Paroxysmal atrial fibrillation: Secondary | ICD-10-CM

## 2022-05-03 ENCOUNTER — Ambulatory Visit: Payer: Medicare Other | Admitting: Neurology

## 2022-05-03 NOTE — Telephone Encounter (Signed)
Prescription refill request received for warfarin Lov: 02/09/22 Hannah Downs) Next INR check: 05/25/22 Warfarin tablet strength: 5mg   Appropriate dose and refill sent to requested pharmacy.

## 2022-05-18 ENCOUNTER — Ambulatory Visit: Payer: Medicare Other | Admitting: Neurology

## 2022-05-18 ENCOUNTER — Telehealth: Payer: Self-pay | Admitting: Neurology

## 2022-05-18 NOTE — Telephone Encounter (Signed)
Pt cancel appt due to not feeling well; bad headache.

## 2022-05-25 ENCOUNTER — Ambulatory Visit: Payer: Medicare Other | Attending: Cardiology | Admitting: *Deleted

## 2022-05-25 DIAGNOSIS — Z5181 Encounter for therapeutic drug level monitoring: Secondary | ICD-10-CM | POA: Diagnosis not present

## 2022-05-25 DIAGNOSIS — I639 Cerebral infarction, unspecified: Secondary | ICD-10-CM | POA: Insufficient documentation

## 2022-05-25 DIAGNOSIS — I4891 Unspecified atrial fibrillation: Secondary | ICD-10-CM | POA: Diagnosis not present

## 2022-05-25 DIAGNOSIS — I480 Paroxysmal atrial fibrillation: Secondary | ICD-10-CM | POA: Diagnosis not present

## 2022-05-25 LAB — POCT INR: INR: 3 (ref 2.0–3.0)

## 2022-05-25 NOTE — Patient Instructions (Signed)
Description   Today take 1 tablet then continue taking 1.5 tablets daily except 1 tablet on Fridays. Recheck INR in 6 weeks. Coumadin Clinic [phone]

## 2022-05-28 ENCOUNTER — Telehealth: Payer: Self-pay

## 2022-05-28 NOTE — Patient Outreach (Signed)
Care Coordination   Reschedule Call  Visit Note   05/28/2022 Name: Hannah Downs MRN: [id number] DOB: [birth year]  Hannah Downs is a 76 y.o. year old female who sees [name], MD for primary care. I spoke with  Hannah Downs by phone today.  What matters to the patients health and wellness today?  Unable to talk today but would like to reschedule call.    Goals Addressed             This Visit's Progress    COMPLETED: Care Coordination Activities-follow up scheduled        Care Coordination Interventions: Active listening / Reflection utilized  Discussed/.Educated Care Coordination Program         SDOH assessments and interventions completed:  No     Care Coordination Interventions Activated:  Yes  Care Coordination Interventions:  Yes, provided   Follow up plan: Follow up call scheduled for 9/24 930    Encounter Outcome:  Pt. Visit Completed   Juanell Fairly RN, BSN, [facility] Care Coordinator [facility]   Phone: [phone]

## 2022-06-23 ENCOUNTER — Other Ambulatory Visit: Payer: Self-pay | Admitting: Cardiovascular Disease

## 2022-06-23 DIAGNOSIS — I480 Paroxysmal atrial fibrillation: Secondary | ICD-10-CM

## 2022-07-01 DIAGNOSIS — K219 Gastro-esophageal reflux disease without esophagitis: Secondary | ICD-10-CM | POA: Diagnosis not present

## 2022-07-01 DIAGNOSIS — R030 Elevated blood-pressure reading, without diagnosis of hypertension: Secondary | ICD-10-CM | POA: Diagnosis not present

## 2022-07-01 DIAGNOSIS — I480 Paroxysmal atrial fibrillation: Secondary | ICD-10-CM | POA: Diagnosis not present

## 2022-07-01 DIAGNOSIS — D6869 Other thrombophilia: Secondary | ICD-10-CM | POA: Diagnosis not present

## 2022-07-01 DIAGNOSIS — Z8673 Personal history of transient ischemic attack (TIA), and cerebral infarction without residual deficits: Secondary | ICD-10-CM | POA: Diagnosis not present

## 2022-07-01 DIAGNOSIS — J309 Allergic rhinitis, unspecified: Secondary | ICD-10-CM | POA: Diagnosis not present

## 2022-07-01 DIAGNOSIS — E785 Hyperlipidemia, unspecified: Secondary | ICD-10-CM | POA: Diagnosis not present

## 2022-07-06 ENCOUNTER — Ambulatory Visit: Payer: Medicare Other | Attending: Cardiovascular Disease

## 2022-07-06 DIAGNOSIS — Z5181 Encounter for therapeutic drug level monitoring: Secondary | ICD-10-CM | POA: Diagnosis not present

## 2022-07-06 DIAGNOSIS — I4891 Unspecified atrial fibrillation: Secondary | ICD-10-CM

## 2022-07-06 LAB — POCT INR: INR: 2.6 (ref 2.0–3.0)

## 2022-07-06 NOTE — Patient Instructions (Signed)
continue taking 1.5 tablets daily except 1 tablet on Fridays. Recheck INR in 6 weeks. Coumadin Clinic [phone]

## 2022-07-14 NOTE — Progress Notes (Unsigned)
[name]   Subjective:     Chief Complaint: Pessary Check and Uterovaginal Prolapse  History of Present Illness: Hannah Downs is a 76 y.o. female with anterior and posterior prolapse who presents for a pessary check. She is using a size #6 cube pessary. The pessary has been working well but she reports some light bleeding when she wipes. She is not using vaginal estrogen but has some at home.     Past Medical History: Patient  has a past medical history of Arthritis, Dysrhythmia, GERD (gastroesophageal reflux disease), Hypercholesterolemia, Hypertension, Lumbar degenerative disc disease, Miscarriage, Osteopenia, Seasonal allergies, Stroke ([place]), Varicose vein, and Vitamin D deficiency.   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   Medications: She has a current medication list which includes the following prescription(s): acetaminophen, atorvastatin, vitamin d3, estradiol, metoprolol succinate, omeprazole, trospium, and warfarin.   Allergies: Patient is allergic to lisinopril.   Social History: Patient  reports that she has never smoked. She has never used smokeless tobacco. She reports that she does not drink alcohol and does not use drugs.      Objective:    Physical Exam: BP (!) 193/108   Pulse 80  Gen: No apparent distress, A&O x 3. Detailed Urogynecologic Evaluation:  Pelvic Exam: Normal external female genitalia; Bartholin's and Skene's glands normal in appearance; urethral meatus with caruncle, no urethral masses or discharge. The pessary was noted to be in place. It was removed and cleaned. Of note, there was thick discharge adhered to the pessary that was scraped off  during cleaning. Speculum exam revealed mild excoriation in the vagina. Silver nitrate was used at two spots in the vagina that were slightly oozing blood. The pessary was replaced. It was comfortable to the patient and fit well.   Patient also has bleeding external hemorrhoids that she is aware of. She reports she "will stock up on preparation H".     Assessment/Plan:    Assessment: Hannah Downs is a 76 y.o. with Pelvic organ prolapse here for a pessary check. She is doing well.   Plan: She will keep the pessary in place until next visit. She will start to use estrogen cream nightly x2 weeks. She will follow-up in 3 months for a pessary check but was encouraged to call if she saw any heavy bleeding, blood clots, or had significant change in her comfort level with the pessary. Patient and her daughter were agreeable to plan of care.   All questions were answered.   Time Spent:  Time spent: I spent 20 minutes dedicated to the care of this patient on the date of this encounter to include pre-visit review of records, face-to-face time with the patient discussing vaginal atrophy, pessary care, estrogen use and post visit documentation.

## 2022-07-15 ENCOUNTER — Encounter: Payer: Self-pay | Admitting: Obstetrics and Gynecology

## 2022-07-15 ENCOUNTER — Ambulatory Visit (INDEPENDENT_AMBULATORY_CARE_PROVIDER_SITE_OTHER): Payer: Medicare Other | Admitting: Obstetrics and Gynecology

## 2022-07-15 VITALS — BP 193/108 | HR 80

## 2022-07-15 DIAGNOSIS — N813 Complete uterovaginal prolapse: Secondary | ICD-10-CM | POA: Diagnosis not present

## 2022-07-15 DIAGNOSIS — N952 Postmenopausal atrophic vaginitis: Secondary | ICD-10-CM | POA: Diagnosis not present

## 2022-07-15 NOTE — Patient Instructions (Signed)
  Use the estrogen cream every night for 2 weeks and then 2-3 times a week continuing on.

## 2022-07-16 ENCOUNTER — Ambulatory Visit: Payer: Medicare Other | Admitting: Obstetrics and Gynecology

## 2022-07-21 NOTE — Progress Notes (Unsigned)
[facility]   Subjective:     Chief Complaint: Pessary Discomfort  History of Present Illness: Hannah Downs is a 76 y.o. female with anterior and posterior prolapse who presents for a pessary discomfort She is using a size #6 cube pessary. And reports it has been uncomfortable and pinching. She states she has seen a small amount of blood. Reports she has been using estrogen cream nightly and using coconut oil in between uses of estrogen.   Past Medical History: Patient  has a past medical history of Arthritis, Dysrhythmia, GERD (gastroesophageal reflux disease), Hypercholesterolemia, Hypertension, Lumbar degenerative disc disease, Miscarriage, Osteopenia, Seasonal allergies, Stroke (HCC), Varicose vein, and Vitamin D deficiency.   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   Medications: She has a current medication list which includes the following prescription(s): acetaminophen, atorvastatin, vitamin d3, estradiol, metoprolol succinate, omeprazole, trospium, and warfarin.   Allergies: Patient is allergic to lisinopril.   Social History: Patient  reports that she has never smoked. She has never used smokeless tobacco. She reports that she does not drink alcohol and does not use drugs.      Objective:    Physical Exam: There were no vitals taken for this visit. Gen: No apparent distress, A&O x 3. Detailed Urogynecologic Evaluation:  Pelvic Exam: Normal external female genitalia; Bartholin's and Skene's glands normal in appearance; urethral meatus with caruncle, no urethral masses or discharge. The pessary was noted to be in place. It was removed and cleaned. Patient has excoriation  from the cube on the left and right vaginal walls. Vaginal estrogen cream applied internally before the pessary was reinserted. There was not significant bleeding to the area but some granulation tissue was noted.     Assessment/Plan:    Assessment: Hannah Downs is a 76 y.o. with Pelvic organ prolapse here for a pessary check.   Attempted a size 5 incontinence dish pessary which came out when patient squatted. Attempted a size 5 donut which was kept in place but came to the opening upon re-exam. Size 6 donut would probably be appropriate, or the option was discussed to order a large, inflatable donut which we can add and take air away from for sizing. The patient and her daughter opted for this option.   Discussed with patient her advanced prolapse and that the pessary is one of the few options besides surgery. Patient and her daughter inquired about surgery and information was given on Colpocleisis. Patient hesitant for any surgery as she had a stroke earlier this year. If surgery were considered, clearance would need to be obtained from neurology. Expressed to patient this was not meant to push her in the direction of surgery, just to give her all options that are available at this time and if she wishes to pursue that option in the future we would schedule an appointment with Dr. Florian Buff.    Plan: She will keep the pessary in place until next visit.  She will continue to use the estrogen cream.  Estrogen cream will be applied to deeper vaginal tissue before pessary cleaning at her visits.  She will consider surgery and if she chooses to go that route we will make an appointment with Dr. Florian Buff.  We will order a large inflatable Donut Pessary for placement when it  comes in.   Patient to follow up in 1 month for pessary check

## 2022-07-22 ENCOUNTER — Encounter: Payer: Self-pay | Admitting: Obstetrics and Gynecology

## 2022-07-22 ENCOUNTER — Ambulatory Visit (INDEPENDENT_AMBULATORY_CARE_PROVIDER_SITE_OTHER): Payer: Medicare Other | Admitting: Obstetrics and Gynecology

## 2022-07-22 VITALS — BP 123/86 | HR 69

## 2022-07-22 DIAGNOSIS — N952 Postmenopausal atrophic vaginitis: Secondary | ICD-10-CM

## 2022-07-22 DIAGNOSIS — N813 Complete uterovaginal prolapse: Secondary | ICD-10-CM | POA: Diagnosis not present

## 2022-07-23 ENCOUNTER — Other Ambulatory Visit: Payer: Self-pay | Admitting: Obstetrics and Gynecology

## 2022-07-23 DIAGNOSIS — N3941 Urge incontinence: Secondary | ICD-10-CM

## 2022-08-15 ENCOUNTER — Encounter: Payer: Self-pay | Admitting: Cardiovascular Disease

## 2022-08-15 NOTE — Progress Notes (Signed)
This encounter was created in error - please disregard.

## 2022-08-16 ENCOUNTER — Encounter: Payer: Medicare Other | Admitting: Cardiovascular Disease

## 2022-08-17 ENCOUNTER — Ambulatory Visit: Payer: Medicare Other

## 2022-08-19 ENCOUNTER — Encounter: Payer: Self-pay | Admitting: Obstetrics and Gynecology

## 2022-08-19 ENCOUNTER — Ambulatory Visit (INDEPENDENT_AMBULATORY_CARE_PROVIDER_SITE_OTHER): Payer: Medicare Other | Admitting: Obstetrics and Gynecology

## 2022-08-19 VITALS — BP 172/102 | HR 73

## 2022-08-19 DIAGNOSIS — N952 Postmenopausal atrophic vaginitis: Secondary | ICD-10-CM | POA: Diagnosis not present

## 2022-08-19 DIAGNOSIS — N813 Complete uterovaginal prolapse: Secondary | ICD-10-CM

## 2022-08-19 DIAGNOSIS — R339 Retention of urine, unspecified: Secondary | ICD-10-CM

## 2022-08-19 NOTE — Progress Notes (Signed)
[facility]   Subjective:     Chief Complaint:  Chief Complaint  Patient presents with   Pessary Check   History of Present Illness: Hannah Downs is a 76 y.o. female with stage IV pelvic organ prolapse who presents for a pessary check. She is using a size #5 cube pessary. The pessary has been causing some pain. She is using vaginal estrogen. She denies vaginal bleeding. Plan last visit was to order an XL Inflatoball pessary to insert at this visit.   Past Medical History: Patient  has a past medical history of Arthritis, Dysrhythmia, GERD (gastroesophageal reflux disease), Hypercholesterolemia, Hypertension, Lumbar degenerative disc disease, Miscarriage, Osteopenia, Seasonal allergies, Stroke (HCC), Varicose vein, and Vitamin D deficiency.   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   Medications: She has a current medication list which includes the following prescription(s): acetaminophen, atorvastatin, vitamin d3, estradiol, metoprolol succinate, omeprazole, trospium, and warfarin.   Allergies: Patient is allergic to lisinopril.   Social History: Patient  reports that she has never smoked. She has never used smokeless tobacco. She reports that she does not drink alcohol and does not use drugs.      Objective:    Physical Exam: BP (!) 172/102   Pulse 73  Gen: No apparent distress, A&O x 3. Detailed Urogynecologic Evaluation:  Pelvic Exam: Normal external female genitalia; Bartholin's and Skene's glands normal in appearance; urethral meatus with caruncle, no urethral masses or discharge. The pessary was noted to be in place. It was removed and cleaned. Speculum exam revealed  erosion in the vagina. The erosion has decreased in size since patient's last visit. Minimal blood noted at the right vaginal wall. The pessary was replaced. It was comfortable to the patient and fit well.     Assessment/Plan:    Assessment: Hannah Downs is a 76 y.o. with #5 Cube pessary here for a pessary check. She is doing well but having some pain on the right upper region in her vagina. Her XL Inflatoball pessary (Lot [id number]) came and this was placed today with instructions given to the daughter how to use it in case she would need to ever deflate the pessary. The pessary was comfortable and fit well.   Plan: She will keep the pessary in place until next visit. She will continue to use estrogen. She will follow-up in 1 months for a pessary check or sooner as needed.   If this pessary does not work she is considering having surgery. She has a follow up visit with her neurologist to confirm eligibility for surgery if this is necessary.   All questions were answered.

## 2022-08-20 ENCOUNTER — Ambulatory Visit (INDEPENDENT_AMBULATORY_CARE_PROVIDER_SITE_OTHER): Payer: Medicare Other | Admitting: Obstetrics and Gynecology

## 2022-08-20 ENCOUNTER — Encounter: Payer: Self-pay | Admitting: Obstetrics and Gynecology

## 2022-08-20 VITALS — BP 168/100 | HR 89

## 2022-08-20 DIAGNOSIS — N813 Complete uterovaginal prolapse: Secondary | ICD-10-CM | POA: Diagnosis not present

## 2022-08-20 DIAGNOSIS — R339 Retention of urine, unspecified: Secondary | ICD-10-CM

## 2022-08-20 LAB — POCT URINALYSIS DIPSTICK
Bilirubin, UA: NEGATIVE
Glucose, UA: NEGATIVE
Ketones, UA: NEGATIVE
Leukocytes, UA: NEGATIVE
Nitrite, UA: NEGATIVE
Protein, UA: NEGATIVE
Spec Grav, UA: 1.02 (ref 1.010–1.025)
Urobilinogen, UA: 0.2 E.U./dL
pH, UA: 7 (ref 5.0–8.0)

## 2022-08-20 NOTE — Progress Notes (Signed)
[facility]   SUBJECTIVE  History of Present Illness: Hannah Downs is a 76 y.o. female seen in urgent visit following her pessary falling out and having acute urinary retention related to prolapse.   We had placed an XL Inflatoball pessary yesterday following removal of her cube pessary in an attempt to better support her complete prolapse. Last night she reports she had a hard sneeze and it came out some but she was able to push it up. She also reports she was in the shower and it came out all the way, still inflated, this morning and she could not pee.    Past Medical History: Patient  has a past medical history of Arthritis, Dysrhythmia, GERD (gastroesophageal reflux disease), Hypercholesterolemia, Hypertension, Lumbar degenerative disc disease, Miscarriage, Osteopenia, Seasonal allergies, Stroke (HCC), Varicose vein, and Vitamin D deficiency.   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   Medications: She has a current medication list which includes the following prescription(s): acetaminophen, atorvastatin, vitamin d3, estradiol, metoprolol succinate, omeprazole, trospium, and warfarin.   Allergies: Patient is allergic to lisinopril.   Social History: Patient  reports that she has never smoked. She has never used smokeless tobacco. She reports that she does not drink alcohol and does not use drugs.      OBJECTIVE     Physical Exam: Vitals:   08/20/22 1046 08/20/22 1125  BP: (!) 182/105 (!) 168/100  Pulse: 75 89   Gen: No apparent distress, A&O x 3.  Detailed Urogynecologic Evaluation:  Prolapse significant and unable to visualize urethra for straight catheterization without  prolapse reduction. Tissues unchanged from previous exam on 12/7.   POC Urine: Negative for all components except trace blood.    ASSESSMENT AND PLAN    Hannah Downs is a 76 y.o. with:  1. Urinary retention   2. Incomplete bladder emptying   3. Uterovaginal prolapse, complete     Re-inserted #6 cube pessary and in and out catheterization done. 450 ML drained from patient's bladder. Daughter and patient instructed on self catheterization and self caths given. Patient's daughter reports if it was necessary she feels like she could assist her mother in self catheterization.  POC urine done today to evaluate for potential UTI. POC urine did not show evidence of UTI. Should she have to in and out catheterize she will call the office on Monday to assess need for indwelling.  Plan for urodynamics testing in January and she is to see her neurologist to confirm eligibility for surgical planning. Plan to see Dr. Florian Buff after Urodynamics for follow up and surgical discussion.     Patient and daughter agreeable to plan of care at this time. Shared decision making used for plan of care today.

## 2022-08-23 ENCOUNTER — Ambulatory Visit: Payer: Medicare Other

## 2022-08-30 ENCOUNTER — Ambulatory Visit: Payer: Medicare Other | Attending: Cardiology

## 2022-08-30 DIAGNOSIS — I480 Paroxysmal atrial fibrillation: Secondary | ICD-10-CM

## 2022-08-30 DIAGNOSIS — Z5181 Encounter for therapeutic drug level monitoring: Secondary | ICD-10-CM

## 2022-08-30 DIAGNOSIS — I4891 Unspecified atrial fibrillation: Secondary | ICD-10-CM | POA: Diagnosis not present

## 2022-08-30 LAB — POCT INR: INR: 3.5 — AB (ref 2.0–3.0)

## 2022-08-30 NOTE — Patient Instructions (Signed)
HOLD TODAY ONLY THEN continue taking 1.5 tablets daily except 1 tablet on Fridays. Recheck INR in 6 weeks. [facility] [phone] 

## 2022-09-08 ENCOUNTER — Telehealth: Payer: Self-pay | Admitting: Obstetrics and Gynecology

## 2022-09-08 IMAGING — CT CT ABD-PELV W/ CM
2 of 6 series · 15 of 46 positions shown, 17 images · IV contrast (OMNIPAQUE 300)
Comparison: 11/04/2021.

CLINICAL DATA: Upset stomach, bowel obstruction suspected. Status
post emergency left inguinal hernia repair.

EXAM:
CT ABDOMEN AND PELVIS WITH CONTRAST
TECHNIQUE: Multidetector CT imaging of the abdomen and pelvis was performed
using the standard protocol following bolus administration of
intravenous contrast.

[Series 2: axial st · axial · 0.69mm/px · z∈[-278,+52]mm · 12 of 79 slices shown, 14 images]
[im 7/79  soft-tissue]
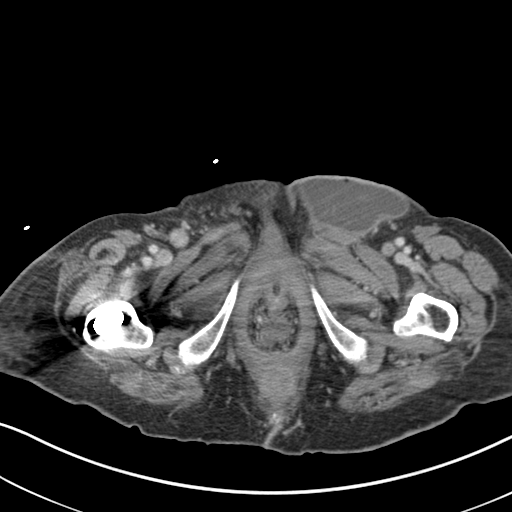
[im 7/79  bone]
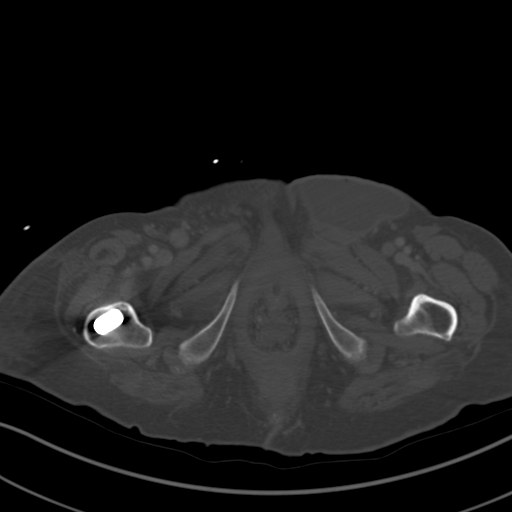
[im 13/79  soft-tissue]
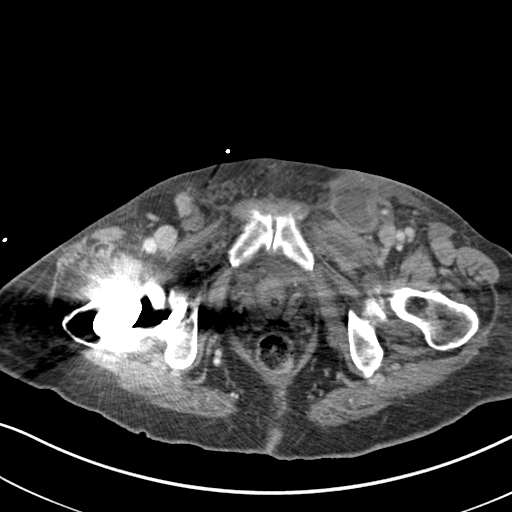
[im 19/79  soft-tissue]
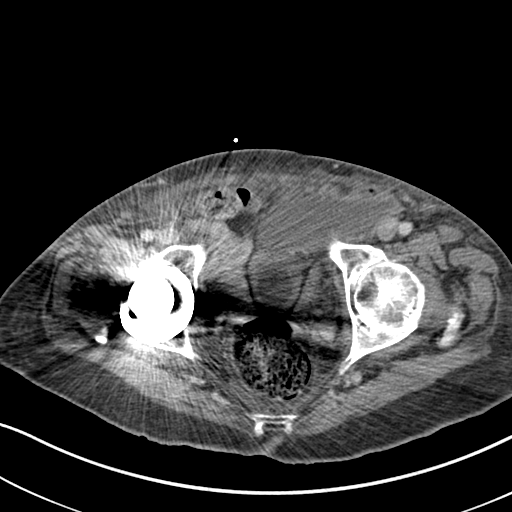
[im 25/79  soft-tissue]
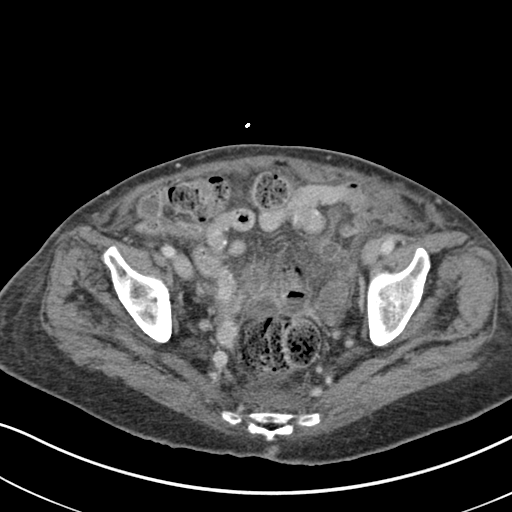
[im 31/79  soft-tissue]
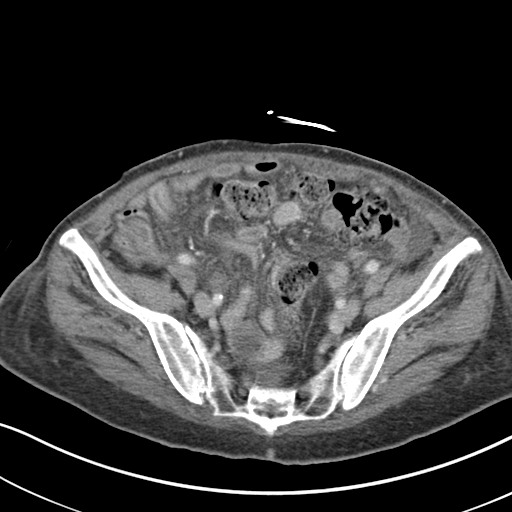
[im 37/79  soft-tissue]
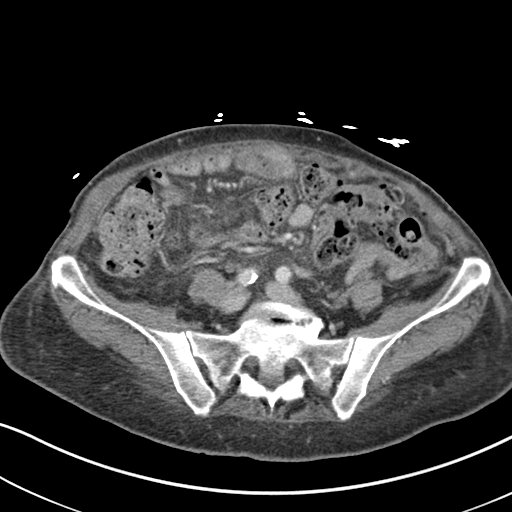
[im 43/79  soft-tissue]
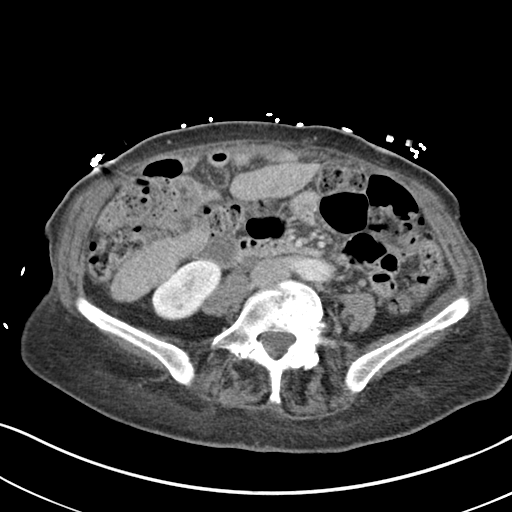
[im 49/79  soft-tissue]
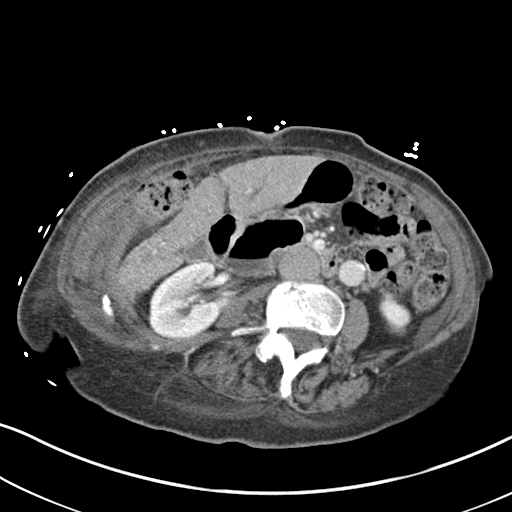
[im 55/79  soft-tissue]
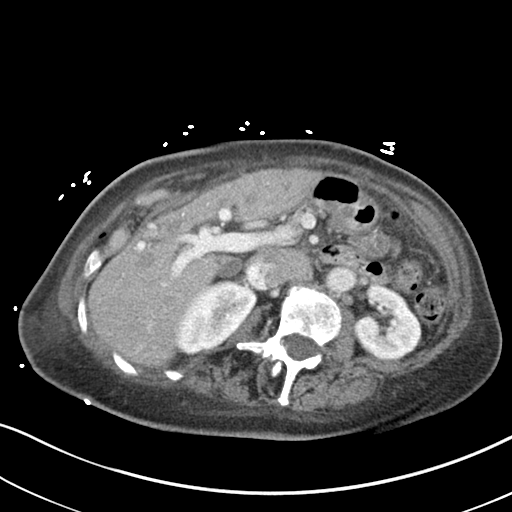
[im 55/79  bone]
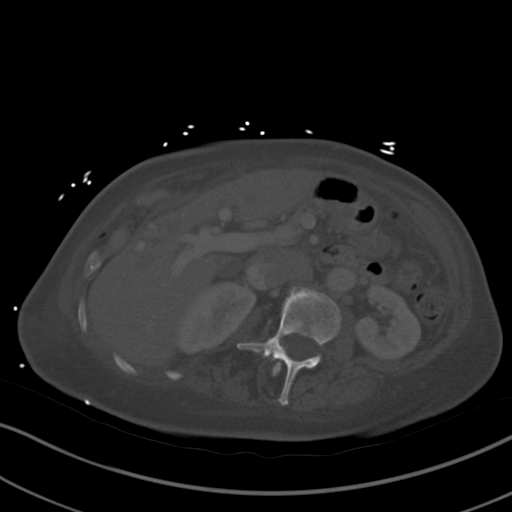
[im 61/79  soft-tissue]
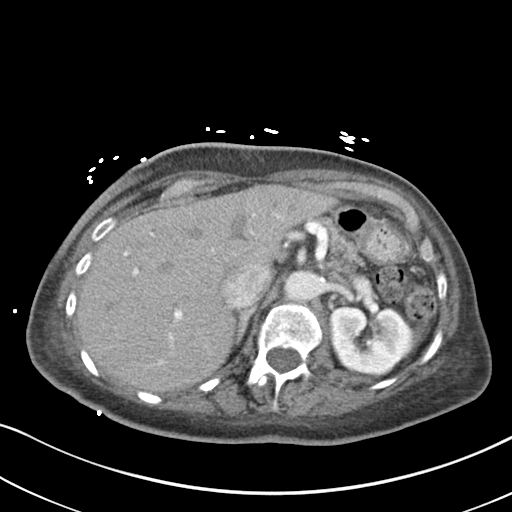
[im 67/79  soft-tissue]
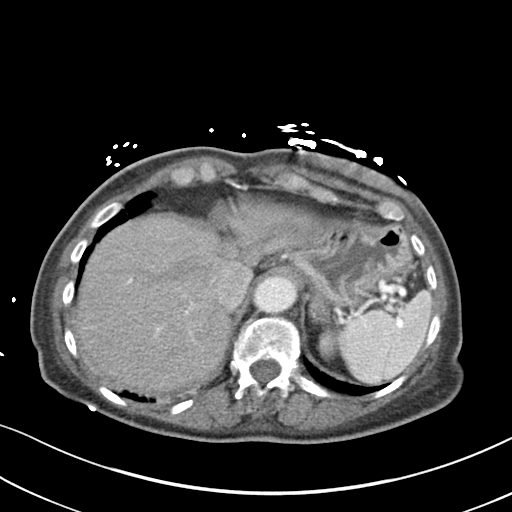
[im 73/79  soft-tissue]
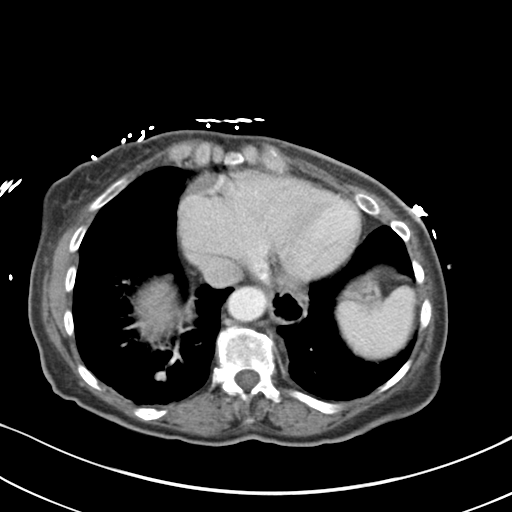

[Series 4: coronal st · coronal · 0.63mm/px · 3 of 115 slices shown]
[im 39/115  soft-tissue]
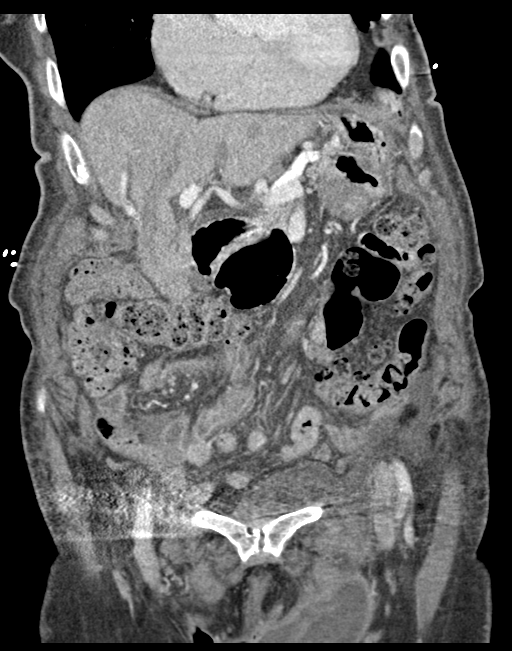
[im 51/115  soft-tissue]
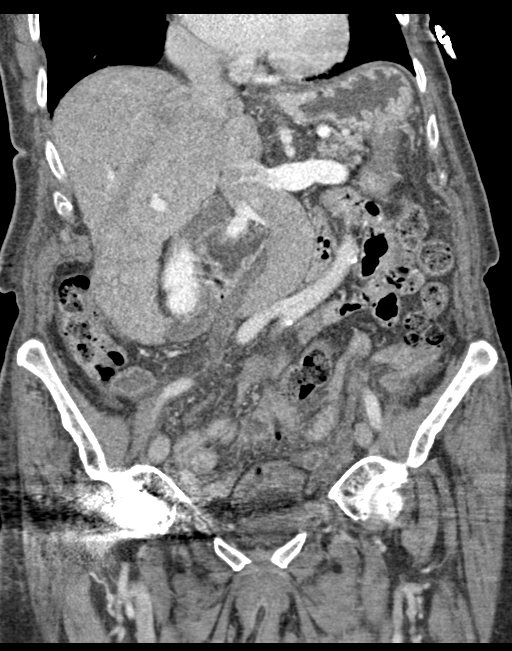
[im 64/115  soft-tissue]
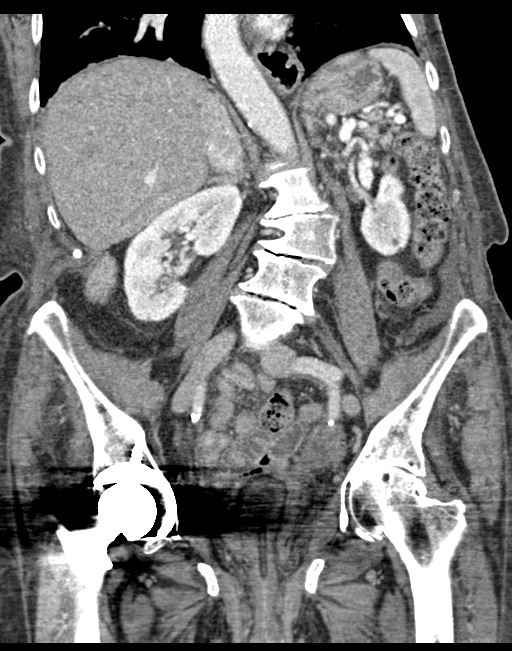

[15 of 46 positions shown; findings below may reference images not displayed]

RADIATION DOSE REDUCTION: This exam was performed according to the
departmental dose-optimization program which includes automated
exposure control, adjustment of the mA and/or kV according to
patient size and/or use of iterative reconstruction technique.

CONTRAST:  100mL OMNIPAQUE IOHEXOL 300 MG/ML  SOLN
FINDINGS: Lower chest: Atelectasis is present at the lung bases.

Hepatobiliary: No focal liver abnormality is seen. No gallstones,
gallbladder wall thickening, or biliary dilatation.

Pancreas: Pancreatic atrophy is noted. No pancreatic ductal
dilatation or surrounding inflammatory changes.

Spleen: Normal in size without focal abnormality.

Adrenals/Urinary Tract: No adrenal nodule or mass. The kidneys
enhance symmetrically. Subcentimeter hypodensities are present in
the lower pole the kidneys which are too small to further
characterize. No renal calculus or hydronephrosis. A Foley catheter
is present in the urinary bladder and the bladder is decompressed.

Stomach/Bowel: There is a small hiatal hernia. The stomach is
otherwise unchanged. A large duodenal diverticulum is noted. No
bowel obstruction is identified. No focal bowel wall thickening. The
appendix is not definitely seen on exam.

Vascular/Lymphatic: No significant vascular findings are present. No
enlarged abdominal or pelvic lymph nodes.

Reproductive: There is pelvic organ prolapse, not significantly
changed from the prior exam. Hardware artifact limits evaluation.

Other: A small amount of ascites is noted in abdomen and pelvis.
There is a fluid collection in the left lower quadrant containing
foci of air which is continuous with the left inguinal hernia and
extends into the anterior thigh on the left. The portion of the
collection in the pelvis on the left measures 7.5 x 3.4 cm. The
collection extending through the left inguinal hernia and anterior
left thigh measures 10.0 x 3.3 cm and is partially excluded from the
field of view. A few foci of air is seen in the collections which is
likely postsurgical. Hyperdense material is also noted in the
collection in the left thigh, possibly representing blood products.

Musculoskeletal: Total hip arthroplasty changes are noted on the
right. Degenerative changes are present in the thoracolumbar spine.
No acute osseous abnormality is identified.
IMPRESSION: 1. No bowel obstruction.
2. Status post left inguinal hernia repair. There is a fluid
collection in the pelvis in the left lower quadrant communicating
through the hernia tract and into the anterior right thigh
containing air, concerning for dehiscence. Findings may represent
seroma or hematoma. Clinical correlation is recommended to exclude
superimposed abscess. Surgical consultation is recommended.
3. Remaining chronic findings are unchanged.

Critical findings were reported to Dr. Navas at [DATE] a.m.

## 2022-09-08 NOTE — Telephone Encounter (Signed)
Called to assess how patient is doing and she reports she is doing okay. She is having to splint with the pessary to urinate. She reports she can push up on the pessary and urinate but overall is doing well.

## 2022-09-20 ENCOUNTER — Encounter: Payer: Self-pay | Admitting: Obstetrics and Gynecology

## 2022-09-20 ENCOUNTER — Ambulatory Visit (INDEPENDENT_AMBULATORY_CARE_PROVIDER_SITE_OTHER): Payer: Medicare Other | Admitting: Obstetrics and Gynecology

## 2022-09-20 VITALS — BP 153/68 | HR 78

## 2022-09-20 DIAGNOSIS — N393 Stress incontinence (female) (male): Secondary | ICD-10-CM

## 2022-09-20 DIAGNOSIS — R339 Retention of urine, unspecified: Secondary | ICD-10-CM | POA: Diagnosis not present

## 2022-09-20 DIAGNOSIS — N3941 Urge incontinence: Secondary | ICD-10-CM | POA: Diagnosis not present

## 2022-09-20 DIAGNOSIS — N813 Complete uterovaginal prolapse: Secondary | ICD-10-CM

## 2022-09-20 LAB — POCT URINALYSIS DIPSTICK
Bilirubin, UA: NEGATIVE
Glucose, UA: NEGATIVE
Ketones, UA: NEGATIVE
Leukocytes, UA: NEGATIVE
Nitrite, UA: NEGATIVE
Protein, UA: POSITIVE — AB
Spec Grav, UA: 1.025 (ref 1.010–1.025)
Urobilinogen, UA: 0.2 E.U./dL
pH, UA: 6 (ref 5.0–8.0)

## 2022-09-20 MED ORDER — PHENAZOPYRIDINE HCL 95 MG PO TABS: 95.0000 mg | ORAL_TABLET | Freq: Three times a day (TID) | ORAL | 0 refills | Status: DC | PRN

## 2022-09-20 NOTE — Patient Instructions (Signed)

## 2022-09-20 NOTE — Progress Notes (Addendum)
[facility] Urodynamics Procedure  Referring Physician: Harlan Stains, MD Date of Procedure: 09/20/2022  Hannah Downs is a 77 y.o. female who presents for urodynamic evaluation. Indication(s) for study: SUI, UUI, and OAB  Vital Signs: There were no vitals taken for this visit.  Laboratory Results: A clean catch urine specimen revealed:  POC urine: Positive for protein, negative for all other components   Voiding Diary: Not done  Procedure Timeout:  The correct patient was verified and the correct procedure was verified. The patient was in the correct position and safety precautions were reviewed based on at the patient's history.  Urodynamic Procedure A 71F dual lumen urodynamics catheter was placed under sterile conditions into the patient's bladder. A 71F catheter was placed into the rectum in order to measure abdominal pressure. EMG patches were placed in the appropriate position.  All connections were confirmed and calibrations/adjusted made. Saline was instilled into the bladder through the dual lumen catheters.  Cough/valsalva pressures were measured periodically during filling.  Patient was allowed to void.  The bladder was then emptied of its residual.  UROFLOW: Revealed a Qmax of 1.4 mL/sec.  She voided 7 mL and had a residual of 20 mL.  It was a interrupted pattern and represented normal habits though interpretation limited due to low voided volume.  CMG: This was performed with sterile water in the sitting position at a fill rate of 20-30 mL/min.    First sensation of fullness was 99 mLs,  First urge was 231 mLs,  Strong urge was 298 mLs and  Capacity was 366 mLs  Stress incontinence was demonstrated Highest positive Barrier CLPP was 40 cmH20 at 350 ml. Highest positive Barrier VLPP was 66 cmH20 at 350 ml.  Detrusor function was normal, with no phasic contractions seen.    Compliance:  Normal. End fill detrusor pressure was 7.5 cmH20.   Calculated compliance was 48.71mL/cmH20  UPP: MUCP with barrier reduction was 66 cm of water.    MICTURITION STUDY: Voiding was performed with reduction using pessary and scopettes in the sitting position.  Pdet at Qmax was 29.4 cm of water.  Qmax was 14.7 mL/sec.  It was a interrupted pattern.  She voided 57 mL and had a residual of 310 mL.  It was not a volitional void, a small detrusor contraction was present and abdominal straining was present  EMG: This was performed with patches.  She had voluntary contractions, recruitment with fill was present and urethral sphincter was not relaxed with void.  The details of the procedure with the study tracings have been scanned into EPIC.   Urodynamic Impression:  1. Sensation was normal; capacity was reduced 2. Stress Incontinence was demonstrated at normal pressures; 3. Detrusor Overactivity was not demonstrated with leakage. 4. Emptying was dysfunctional with a elevated PVR (378ml), a small detrusor contraction was present,  abdominal straining present, dyssynergic urethral sphincter activity on EMG.  Plan: - The patient will follow up  to discuss the findings and treatment options.

## 2022-09-21 ENCOUNTER — Ambulatory Visit (INDEPENDENT_AMBULATORY_CARE_PROVIDER_SITE_OTHER): Payer: Medicare Other | Admitting: Neurology

## 2022-09-21 ENCOUNTER — Encounter: Payer: Self-pay | Admitting: Neurology

## 2022-09-21 VITALS — BP 165/97 | HR 67 | Ht 61.0 in | Wt 117.0 lb

## 2022-09-21 DIAGNOSIS — Z8673 Personal history of transient ischemic attack (TIA), and cerebral infarction without residual deficits: Secondary | ICD-10-CM | POA: Diagnosis not present

## 2022-09-21 DIAGNOSIS — I482 Chronic atrial fibrillation, unspecified: Secondary | ICD-10-CM | POA: Diagnosis not present

## 2022-09-21 NOTE — Patient Instructions (Signed)
I had a long d/w patient and her daughter about her s recent stroke, atrial fibrillation, risk for recurrent stroke/TIAs, personally independently reviewed imaging studies and stroke evaluation results and answered questions.Continue warfarin daily  for secondary stroke prevention and maintain strict control of hypertension with blood pressure goal below 130/90, diabetes with hemoglobin A1c goal below 6.5% and lipids with LDL cholesterol goal below 70 mg/dL. I also advised the patient to eat a healthy diet with plenty of whole grains, cereals, fruits and vegetables, exercise regularly and maintain ideal body weight .patient is planning on having bladder surgery soon.  He is neurologically cleared to undergo the surgery but will have to hold warfarin 3 to 5 days prior to the procedure and perhaps bridged with Lovenox reduce her perioperative risk of stroke with a small but acceptable periprocedural risk of stroke or TIA patient is willing.  We discussed alternative to warfarin considering participation inLibrexia AF trial ( eliquis versus Milvexian ) but patient is not interested and wants to stay on warfarin.  Followup in the future with my nurse practitioner in 6 months or call earlier if necessary.

## 2022-09-21 NOTE — Progress Notes (Signed)
[facility] [address] [place]. [place] [address] [phone]       OFFICE FOLLOW-UP VISIT NOTE  Ms. Hannah Downs Date of Birth:  [birth year] Medical Record Number:  [id number]   Referring MD: [name]  Reason for Referral: Stroke  HPI: Initial visit 01/12/2022 Hannah Downs is a 77 year old Caucasian lady seen today for initial office consultation p visit for stroke.  History is obtained from the patient and review of electronic medical records and personally reviewed pertinent available imaging films in PACS.She has medical history significant for paroxysmal atrial fibrillation previously on Warfarin with recent transition to Eliquis, hyperlipidemia, essential hypertension, recent discharge from the hospital on 2/27 following a left femoral hernia incarcerated with small bowel causing obstruction s/p laparoscopic repair on 2/23. Prior to her hernia repair on 2/23 the patient's warfarin was reversed with vitamin K and Kcentra with development of right sided facial and right arm numbness on 2/24 with MRI evidence of left frontoparietal infarction. Patient was discharged on Eliquis with he first dose on 11/09/21 at 1300 and has remained compliant with this mediation regimen outpatient. On 2/28, patient woke up in her usual state of health at 06:30 and had a cup of coffee. She states that at around 08:30, she had an acute onset of dizziness but attributed this to her atrial fibrillation and states that it resolved briefly thereafter. She states that she was just feeling generally "off" and decided to go to the atrial fibrillation clinic for evaluation. While she was at the clinic at around 14:00, the patient states that dizziness "hit her like a ton of bricks" with associated nausea, vomiting, and imbalance.  She was not considered for thrombolysis due to having taken Eliquis.  CT scan of the head was performed which showed no acute abnormality only changes of small vessel  disease.  MRI scan of the brain showed a right posterior inferior cerebellar artery territory infarct.  There is no significant mass effect on the fourth ventricle or midline shift.  2D echo showed ejection fraction 6065% and no clot was noted.  LDL cholesterol 43 mg percent hemoglobin A1c was 5.4.  Patient had been on Eliquis for a few days prior to her second admission for stroke but she stated she could not afford it due to high co-pay and hence will switch back to warfarin.  She is tolerating warfarin well without bleeding or bruising but her INR has been quite fluctuating and she has to now go every 2 weeks to get it checked.  Patient is currently getting home health and physical and Occupational Therapy.  She can walk without a walker and states her balance is a lot improved.  Numbness is also resolved.  She has no new complaints. Updated 09/21/2022 : She returns for follow-up after last visit in May 2023.  She states she is doing well.  She has had no recurrent stroke or TIA symptoms.  She remains on warfarin which she seems to be tolerating well though her INR does fluctuate a bit.  Last INR was greater than 3 checked few weeks ago.  She states her blood pressure is well-controlled though she does not check it frequently.  Today it is elevated in office at 165/97.  She remains on Lipitor which is tolerating well without muscle aches and pains she has no new complaints. ROS:   14 system review of systems is positive for numbness, imbalance, walking difficulty, decreased hearing and all other systems negative  PMH:  Past  Medical History:  Diagnosis Date   Arthritis    Dysrhythmia    afib   GERD (gastroesophageal reflux disease)    Hypercholesterolemia    Hypertension    Lumbar degenerative disc disease    Miscarriage    times 3   Osteopenia    Seasonal allergies    Stroke (HCC)    Varicose vein    right leg   Vitamin D deficiency     Social History:  Social History   Socioeconomic  History   Marital status: Married    Spouse name: Not on file   Number of children: Not on file   Years of education: Not on file   Highest education level: Not on file  Occupational History   Not on file  Tobacco Use   Smoking status: Never   Smokeless tobacco: Never  Vaping Use   Vaping Use: Never used  Substance and Sexual Activity   Alcohol use: No   Drug use: No   Sexual activity: Not Currently    Birth control/protection: Patch  Other Topics Concern   Not on file  Social History Narrative   Not on file   Social Determinants of Health   Financial Resource Strain: Not on file  Food Insecurity: Not on file  Transportation Needs: Not on file  Physical Activity: Not on file  Stress: Not on file  Social Connections: Not on file  Intimate Partner Violence: Not on file    Medications:   Current Outpatient Medications on File Prior to Visit  Medication Sig Dispense Refill   acetaminophen (TYLENOL) 500 MG tablet Take 1,000 mg by mouth every 6 (six) hours as needed for moderate pain.     aspirin EC 81 MG tablet Take 81 mg by mouth daily. Swallow whole.     atorvastatin (LIPITOR) 20 MG tablet Take 1 tablet (20 mg total) by mouth daily. 90 tablet 3   Cholecalciferol (VITAMIN D3) 50 MCG (2000 UT) TABS Take 2,000 Units by mouth in the morning.     estradiol (ESTRACE) 0.1 MG/GM vaginal cream Place 0.5 g vaginally 2 (two) times a week. Place 0.5g nightly twice a week 30 g 11   metoprolol succinate (TOPROL-XL) 100 MG 24 hr tablet Take 1.5 tablets (150 mg total) by mouth daily. Take with or immediately following a meal. 135 tablet 3   omeprazole (PRILOSEC OTC) 20 MG tablet Take 20 mg by mouth daily.     phenazopyridine (PYRIDIUM) 95 MG tablet Take 1 tablet (95 mg total) by mouth 3 (three) times daily as needed for pain. 15 tablet 0   trospium (SANCTURA) 20 MG tablet Take 1 tablet by mouth twice daily 30 tablet 11   warfarin (COUMADIN) 5 MG tablet TAKE 1 & 1/2 (ONE & ONE-HALF) TABLETS  BY MOUTH ONCE DAILY EXCEPT 1 TABLET ON FRIDAYS OR AS DIRECTED BY ANTICOAGULATION CLINIC 45 tablet 3   No current facility-administered medications on file prior to visit.    Allergies:   Allergies  Allergen Reactions   Lisinopril Cough    Physical Exam General: Frail elderly Caucasian lady, seated, in no evident distress Head: head normocephalic and atraumatic.   Neck: supple with no carotid or supraclavicular bruits Cardiovascular: regular rate and rhythm, no murmurs Musculoskeletal: no deformity Skin:  no rash/petichiae Vascular:  Normal pulses all extremities  Neurologic Exam Mental Status: Awake and fully alert. Oriented to place and time. Recent and remote memory intact. Attention span, concentration and fund of knowledge appropriate. Mood and  affect appropriate.  Cranial Nerves: Fundoscopic exam not done. Pupils equal, briskly reactive to light. Extraocular movements full without nystagmus. Visual fields full to confrontation. Hearing slightly diminished bilaterally.  t. Facial sensation intact. Face, tongue, palate moves normally and symmetrically.  Motor: Normal bulk and tone. Normal strength in all tested extremity muscles. Sensory.: intact to touch , pinprick , position and vibratory sensation.  Coordination: Rapid alternating movements normal in all extremities. Finger-to-nose and heel-to-shin performed accurately bilaterally. Gait and Station: Arises from chair without difficulty. Stance is normal. Gait demonstrates normal stride length and balance . Able to heel, toe and tandem walk with moderate difficulty.  Reflexes: 1+ and symmetric. Toes downgoing.       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  Vascular risk factors of hypertension,  hyperlipidemia and atrial fibrillation.  She is doing well from neurovascular standpoint.     PLAN: II had a long d/w patient and her daughter about her s recent stroke, atrial fibrillation, risk for recurrent stroke/TIAs, personally independently reviewed imaging studies and stroke evaluation results and answered questions.Continue warfarin daily  for secondary stroke prevention and maintain strict control of hypertension with blood pressure goal below 130/90, diabetes with hemoglobin A1c goal below 6.5% and lipids with LDL cholesterol goal below 70 mg/dL. I also advised the patient to eat a healthy diet with plenty of whole grains, cereals, fruits and vegetables, exercise regularly and maintain ideal body weight .patient is planning on having bladder surgery soon.  He is neurologically cleared to undergo the surgery but will have to hold warfarin 3 to 5 days prior to the procedure and perhaps bridged with Lovenox reduce her perioperative risk of stroke with a small but acceptable periprocedural risk of stroke or TIA patient is willing.  We discussed alternative to warfarin considering participation inLibrexia AF trial ( eliquis versus Milvexian ) but patient is not interested and wants to stay on warfarin.  Followup in the future with my nurse practitioner in 6 months or call earlier if necessary.  Greater than 50% time during this 35-minute visit was spent on counseling and coordination of care about her embolic strokes and atrial fibrillation and discussion about risk benefits of anticoagulation and answering questions.   Delia Heady, MD Note: This document was prepared with digital dictation and possible smart phrase technology. Any transcriptional errors that result from this process are unintentional.

## 2022-09-24 ENCOUNTER — Ambulatory Visit (INDEPENDENT_AMBULATORY_CARE_PROVIDER_SITE_OTHER): Payer: Medicare Other | Admitting: Obstetrics and Gynecology

## 2022-09-24 ENCOUNTER — Encounter: Payer: Self-pay | Admitting: Obstetrics and Gynecology

## 2022-09-24 VITALS — BP 189/99 | HR 75

## 2022-09-24 DIAGNOSIS — R339 Retention of urine, unspecified: Secondary | ICD-10-CM | POA: Diagnosis not present

## 2022-09-24 DIAGNOSIS — N816 Rectocele: Secondary | ICD-10-CM | POA: Diagnosis not present

## 2022-09-24 DIAGNOSIS — N813 Complete uterovaginal prolapse: Secondary | ICD-10-CM | POA: Diagnosis not present

## 2022-09-24 DIAGNOSIS — N393 Stress incontinence (female) (male): Secondary | ICD-10-CM | POA: Diagnosis not present

## 2022-09-24 DIAGNOSIS — N8110 Cystocele, unspecified: Secondary | ICD-10-CM

## 2022-09-24 NOTE — Progress Notes (Addendum)
[facility] Return Visit  SUBJECTIVE  History of Present Illness: Hannah Downs is a 77 y.o. female seen in follow-up for Prolapse. She has a #6 cube. It has been intermittently falling out and she would like to consider surgery. She underwent urodynamic testing.   Urodynamic Impression:  1. Sensation was normal; capacity was reduced 2. Stress Incontinence was demonstrated at normal pressures; 3. Detrusor Overactivity was not demonstrated with leakage. 4. Emptying was dysfunctional with a elevated PVR (348ml), a small detrusor contraction was present,  abdominal straining present, dyssynergic urethral sphincter activity on EMG.  Seen by the Neurologist (Dr Hannah Downs) who states she is neurologically cleared for surgery. She is on warfarin for A. Fib and this is prescribed by cardiology/ coumadin clinic.   Past Medical History: Patient  has a past medical history of Arthritis, Dysrhythmia, GERD (gastroesophageal reflux disease), Hypercholesterolemia, Hypertension, Lumbar degenerative disc disease, Miscarriage, Osteopenia, Seasonal allergies, Stroke ([place]), Varicose vein, and Vitamin D deficiency.   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   Medications: She has a current medication list which includes the following prescription(s): acetaminophen, aspirin ec, atorvastatin, vitamin d3, estradiol, metoprolol succinate, omeprazole, phenazopyridine, trospium, and warfarin.   Allergies: Patient is allergic to lisinopril.   Social History: Patient  reports that she has never smoked. She has never used smokeless tobacco. She reports that she does not drink alcohol and does not use drugs.       OBJECTIVE     Physical Exam: Vitals:   09/24/22 1237  BP: (!) 189/99  Pulse: 75   Gen: No apparent distress, A&O x 3.  Detailed Urogynecologic Evaluation:  Deferred. Prior exam showed:  POP-Q (11/23/21):    POP-Q   3                                            Aa   9                                           Ba   9                                              C    9                                            Gh   3                                            Pb   9  tvl    3                                            Ap   9                                            Bp   -2.5                                              D         ASSESSMENT AND PLAN    [name] is a 77 y.o. with:  1. Prolapse of anterior vaginal wall   2. Prolapse of posterior vaginal wall   3. Uterovaginal prolapse, complete   4. Incomplete bladder emptying   5. SUI (stress urinary incontinence, female)    - We discussed that the least invasive and most effective option for surgery would be a Colpocleisis.  - We reviewed the results of urodynamic testing which showed SUI and incomplete bladder emptying. If emptying does not improve after surgery, will need to obtain US to look for hydronephrosis and renal function testing.  - Surgery will not address SUI symptoms as a procedure could complicate urinary retention. She is not bothered by her leakage at this time.   Plan for surgery: Exam under anesthesia, LeForte Colpocleisis, levator plication with perineorrhaphy, cystoscopy  - We reviewed the patient's specific anatomic and functional findings, with the assistance of diagrams, and together finalized the above procedure. The planned surgical procedures were discussed along with the surgical risks outlined below, which were also provided on a detailed handout. Additional treatment options including expectant management, conservative management, medical  management were discussed where appropriate.  We reviewed the benefits and risks of each treatment option.   General Surgical Risks: For all procedures, there are risks of bleeding, infection, damage to surrounding organs including but not limited to bowel, bladder, blood vessels, ureters and nerves, and need for further surgery if an injury were to occur. These risks are all low with minimally invasive surgery.   There are risks of numbness and weakness at any body site or buttock/rectal pain.  It is possible that baseline pain can be worsened by surgery, either with or without mesh. If surgery is vaginal, there is also a low risk of possible conversion to laparoscopy or open abdominal incision where indicated. Very rare risks include blood transfusion, blood clot, heart attack, pneumonia, or death.   There is also a risk of short-term postoperative urinary retention with need to use a catheter. About half of patients need to go home from surgery with a catheter, which is then later removed in the office. The risk of long-term need for a catheter is very low. There is also a risk of worsening of overactive bladder.    - For preop Visit:  She is required to have a visit within 30 days of her surgery.   - Medical clearance: required Letter sent to Dr Acie Fredrickson (Cardiology) requesting risk stratification and medical optimization and recommendations for perioperative anticoagulation.  Neurological clearance obtained from Dr Hannah Downs.  - Anticoagulant use: Yes- warfarin -  Medicaid Hysterectomy form: n/a - Accepts blood transfusion: Yes - Expected length of stay: outpatient  Request sent for surgery scheduling.   Hannah Folds, MD

## 2022-09-29 ENCOUNTER — Encounter: Payer: Self-pay | Admitting: Obstetrics and Gynecology

## 2022-09-30 ENCOUNTER — Telehealth: Payer: Self-pay | Admitting: *Deleted

## 2022-09-30 NOTE — Telephone Encounter (Signed)
Pre-operative Risk Assessment    Patient Name: Hannah Downs  DOB: October 30, 1945 MRN: [id number]      Request for Surgical Clearance    Procedure:   PELVIC ORGAN PROLAPSE  Date of Surgery:  Clearance TBD                                 Surgeon:  Lanetta Inch, MD Surgeon's Group or Practice Name:  [facility]Y AT [facility] Phone number:  [phone] Fax number:  [phone]   Type of Clearance Requested:   - Medical  - Pharmacy:  Hold Aspirin and Warfarin (Coumadin) NOT INDICATED HOW LONG   Type of Anesthesia:  General    Additional requests/questions:    Wilhemina Cash   09/30/2022, 9:09 AM

## 2022-10-01 NOTE — Telephone Encounter (Signed)
Preoperative team, patient will need updated BMP and CBC for pharmacy to complete recommendations.  Please order BMP and CBC.  Thank you for your help.  Jossie Ng. Everton Bertha NP-C     10/01/2022, 12:46 PM [place] [address] Office [phone] Fax [phone]

## 2022-10-01 NOTE — Telephone Encounter (Signed)
Spoke with patient who states that she has not made up her mind if she will be having the surgery or not. Patient also states that she has until the end of the month to decide and will let our office know. I did inform patient that she does have an appointment on 1/23 with Dr. Acie Fredrickson and if she made up her mind by then to let him know and labs could be done then so she wouldn't have to make two trips to the office.

## 2022-10-01 NOTE — Telephone Encounter (Signed)
Patient with diagnosis of afib on warfarin for anticoagulation.    Procedure:   PELVIC ORGAN PROLAPSE  Date of procedure: TBD   CHA2DS2-VASc Score = 7   This indicates a 11.2% annual risk of stroke. The patient's score is based upon: CHF History: 0 HTN History: 1 Diabetes History: 0 Stroke History: 2 Vascular Disease History: 1 Age Score: 2 Gender Score: 1      Patient will need updated BMP and CBC prior to bridge being coordinated.  Per office protocol, patient can hold warfarin for 5 days prior to procedure.    Patient WILL need bridging with Lovenox (enoxaparin) around procedure.  This can be coordinated by our coumadin clinic. Patient will need to let coumadin clinic know as soon as procedure is scheduled.  **This guidance is not considered finalized until pre-operative APP has relayed final recommendations.**

## 2022-10-04 ENCOUNTER — Encounter: Payer: Self-pay | Admitting: Cardiovascular Disease

## 2022-10-04 NOTE — Progress Notes (Signed)
Cardiology Office Note:    Date:  10/05/2022   ID:  MAALI STOLZE, DOB 12/20/1945, MRN [id number]  PCP:  [name], MD  [facility] Cardiologist:  [facility] Electrophysiologist:  None   Referring MD: [name], MD   Chief Complaint  Patient presents with   Atrial Fibrillation    Sept. 7, 2021:   Hannah Downs is a 77 y.o. female with a hx of HTN, HLD, PAF  She had been complaining of palpitations and an event monitor showed atrial fib .  She was originally prescribed Eliquis but is now on Coumadin due to cost   She was originally seen in [place] but would like to switch to see me since they live here and  I see her husband .  Has been having palpitations of the past year.  Has had some dizziness,   Fell on the bathroom floor.  Her HR seemed to be normal after she passed. Out   Was seen in the [place] office.  Orthostatic BP were checked, Zio monitor showed Afib   Has paroxysmal AFib Can last for 30 minutes.  Labs from Dr. Cliffton Asters were reviewed Potassium is 4.4.   Hb is 14. 5   Echo in July show normal LV function. Grade 2 diastolic dysfunction  She is feeling better now that she is on metoprolol   Feb. 4, 2022:  Hannah Downs is seen today for follow up of her PAF She has grade 2 diastolic dysfunction She is feeling well   Feb. 28, 2023 Seen with daughter, Rosalita Chessman SBO  from an inguinal hernia on Jan. 6.  Was not able to have surgery  A month later developed incarcerated hernia and SBO . INR was 2.6  Received Vit K and synthetic Vit K Wheeled into surgery . There was a lapse in getting her back on warfarin.  Ultimately was placed on Eliquis  but its $500+ Daughter said the family will take care of the cost Will as Lyn Via to help with patient assistance   Is eating and drinking ok now .   Wt is 124 lbs Age 58 Creatinine 0.5   Jan. 23, 2024 Hannah Downs is seen for follow up of her PAF, chronic diastolic  dysfunction  Her weight is down 4 pounds to 120 pounds today. Seen with Hank today   Needs to have a bladder tack soon  Larita Fife does not know if she wants to go though with it  She had 2 strokes this past year when she held her warfarin following surgery  We will bridge with Lovenox. She will be at low risk for her upcoming surgery if she would like    She is on ASA and warfarin .  I dont think she will need ASA long term   Will restart HCTZ 25 mg a day  Our coumadin clinic to manage the warfarin dosing , lovenox dose and schedule  and follow up        Past Medical History:  Diagnosis Date   Arthritis    Dysrhythmia    afib   GERD (gastroesophageal reflux disease)    Hypercholesterolemia    Hypertension    Lumbar degenerative disc disease    Miscarriage    times 3   Osteopenia    Seasonal allergies    Stroke (HCC)    Varicose vein    right leg   Vitamin D deficiency     Past Surgical History:  Procedure Laterality  Date   DILATION AND CURETTAGE OF UTERUS  1998   hx of approx 26 years ago   FEMORAL HERNIA REPAIR  11/02/2006   Emergent reduction/primary repair.  Dr Grier Mitts   INGUINAL HERNIA REPAIR  02/03/2007   open w mesh.  Dr Grier Mitts   INGUINAL HERNIA REPAIR N/A 11/04/2021   Procedure: lysis of adhesions reduction of incarcerated recurrent left femoral hernia laparoscopic repair. laparoscopic right femoral hernia repair and right obturator repair with mesh. tap block;  Surgeon: Karie Soda, MD;  Location: [facility];  Service: General;  Laterality: N/A;   ROBOTIC ASSISTED DIAGNOSTIC LAPAROSCOPY  09/18/2021   robotic reduction/partial takedown of recurrent inguinal hernia   TOTAL HIP ARTHROPLASTY Right 06/26/2014   Procedure: RIGHT TOTAL HIP ARTHROPLASTY ANTERIOR APPROACH WITH ACETABULAR AUTOGRAFT;  Surgeon: Loanne Drilling, MD;  Location: [facility];  Service: Orthopedics;  Laterality: Right;    Current Medications: Current Meds  Medication Sig   acetaminophen  (TYLENOL) 500 MG tablet Take 1,000 mg by mouth every 6 (six) hours as needed for moderate pain.   aspirin EC 81 MG tablet Take 81 mg by mouth daily. Swallow whole.   atorvastatin (LIPITOR) 20 MG tablet Take 1 tablet (20 mg total) by mouth daily.   Cholecalciferol (VITAMIN D3) 50 MCG (2000 UT) TABS Take 2,000 Units by mouth in the morning.   estradiol (ESTRACE) 0.1 MG/GM vaginal cream Place 0.5 g vaginally 2 (two) times a week. Place 0.5g nightly twice a week   hydrochlorothiazide (HYDRODIURIL) 25 MG tablet Take 1 tablet (25 mg total) by mouth daily.   metoprolol succinate (TOPROL-XL) 100 MG 24 hr tablet Take 1.5 tablets (150 mg total) by mouth daily. Take with or immediately following a meal.   omeprazole (PRILOSEC OTC) 20 MG tablet Take 20 mg by mouth daily.   potassium chloride (KLOR-CON) 10 MEQ tablet Take 1 tablet (10 mEq total) by mouth daily.   trospium (SANCTURA) 20 MG tablet Take 1 tablet by mouth twice daily   warfarin (COUMADIN) 5 MG tablet TAKE 1 & 1/2 (ONE & ONE-HALF) TABLETS BY MOUTH ONCE DAILY EXCEPT 1 TABLET ON FRIDAYS OR AS DIRECTED BY ANTICOAGULATION CLINIC   [DISCONTINUED] phenazopyridine (PYRIDIUM) 95 MG tablet Take 1 tablet (95 mg total) by mouth 3 (three) times daily as needed for pain.     Allergies:   Lisinopril   Social History   Socioeconomic History   Marital status: Married    Spouse name: Not on file   Number of children: Not on file   Years of education: Not on file   Highest education level: Not on file  Occupational History   Not on file  Tobacco Use   Smoking status: Never   Smokeless tobacco: Never  Vaping Use   Vaping Use: Never used  Substance and Sexual Activity   Alcohol use: No   Drug use: No   Sexual activity: Not Currently    Birth control/protection: Patch  Other Topics Concern   Not on file  Social History Narrative   Not on file   Social Determinants of Health   Financial Resource Strain: Not on file  Food Insecurity: Not on file   Transportation Needs: Not on file  Physical Activity: Not on file  Stress: Not on file  Social Connections: Not on file     Family History: The patient's family history includes Cancer in her mother; Stroke in her father.  ROS:   Please see the history of present illness.  All other systems reviewed and are negative.  EKGs/Labs/Other Studies Reviewed:       EKG: October 05, 2022: Normal sinus rhythm at 75.  Normal EKG.   Recent Labs: 11/08/2021: Magnesium 1.7 11/11/2021: Hemoglobin 10.4; Platelets 312 11/12/2021: ALT 77 11/13/2021: BUN 5; Creatinine, Ser 0.45; Potassium 3.6; Sodium 135  Recent Lipid Panel    Component Value Date/Time   CHOL 109 11/08/2021 0206   TRIG 55 11/08/2021 0206   HDL 55 11/08/2021 0206   CHOLHDL 2.0 11/08/2021 0206   VLDL 11 11/08/2021 0206   LDLCALC 43 11/08/2021 0206    Physical Exam:    Physical Exam: Blood pressure (!) 140/100, pulse 75, height 5' (1.524 m), weight 120 lb 3.2 oz (54.5 kg).  HYPERTENSION CONTROL Vitals:   10/05/22 1404 10/05/22 1427  BP: (!) 150/90 (!) 140/100    The patient's blood pressure is elevated above target today.  In order to address the patient's elevated BP: A new medication was prescribed today.       GEN:  Well nourished, well developed in no acute distress HEENT: Normal NECK: No JVD; No carotid bruits LYMPHATICS: No lymphadenopathy CARDIAC: RRR , no murmurs, rubs, gallops RESPIRATORY:  Clear to auscultation without rales, wheezing or rhonchi  ABDOMEN: Soft, non-tender, non-distended MUSCULOSKELETAL:  No edema; No deformity  SKIN: Warm and dry NEUROLOGIC:  Alert and oriented x 3    ASSESSMENT:    1. Atrial fibrillation, unspecified type (HCC)   2. Essential hypertension      PLAN:       Paroxysmal atrial fib:   She is at low risk for her upcoming bladder surgery if she choses to go through with it   Our coumadin clinic will determine Lovenox dose, and dosing schedule     3.   History of embolic stroke: She will be seeing neurology.  Continue anticoagulation for her paroxysmal atrial fibrillation.   She had strokes following her surgery.  These were emergent surgeries and she did not have time to properly hold Coumadin, dosed with Lovenox.  For future surgery she will ideally need to have her Coumadin held, Lovenox bridging close follow-up with our Coumadin clinic.  With proper attention to her warfarin and Lovenox I think we can reduce her risk of stroke very low for her upcoming bladder surgery another surgical procedures.  3.  Hypertension: Blood pressure has been elevated.  She used to be on HCTZ.  Will restart hydrochlorothiazide 25 mg a day and potassium chloride 10 mg a day.  Recheck a basic metabolic profile in 3 weeks.  Will see her again in 3 months.  Medication Adjustments/Labs and Tests Ordered: Current medicines are reviewed at length with the patient today.  Concerns regarding medicines are outlined above.  Orders Placed This Encounter  Procedures   Basic Metabolic Panel (BMET)   Basic Metabolic Panel (BMET)   CBC   EKG 12-Lead   Meds ordered this encounter  Medications   hydrochlorothiazide (HYDRODIURIL) 25 MG tablet    Sig: Take 1 tablet (25 mg total) by mouth daily.    Dispense:  90 tablet    Refill:  2   potassium chloride (KLOR-CON) 10 MEQ tablet    Sig: Take 1 tablet (10 mEq total) by mouth daily.    Dispense:  90 tablet    Refill:  2     Patient Instructions  Medication Instructions:  Your physician has recommended you make the following change in your medication:  Start hydrochlorothiazide 25 mg  by mouth daily  Start potassium 10 meq by mouth daily   *If you need a refill on your cardiac medications before your next appointment, please call your pharmacy*   Lab Work: Your physician recommends that you return for lab work in: 3 weeks --BMP  Lab work to be done today --BMP and CBC   If you have labs (blood work) drawn  today and your tests are completely normal, you will receive your results only by: MyChart Message (if you have MyChart) OR A paper copy in the mail If you have any lab test that is abnormal or we need to change your treatment, we will call you to review the results.   Testing/Procedures: none   Follow-Up: At [facility], you and your health needs are our priority.  As part of our continuing mission to provide you with exceptional heart care, we have created designated Provider Care Teams.  These Care Teams include your primary Cardiologist (physician) and Advanced Practice Providers (APPs -  Physician Assistants and Nurse Practitioners) who all work together to provide you with the care you need, when you need it.  We recommend signing up for the patient portal called "MyChart".  Sign up information is provided on this After Visit Summary.  MyChart is used to connect with patients for Virtual Visits (Telemedicine).  Patients are able to view lab/test results, encounter notes, upcoming appointments, etc.  Non-urgent messages can be sent to your provider as well.   To learn more about what you can do with MyChart, go to [email or web address].    Your next appointment:   3 month(s)  Provider:   APP  If Card or EP not listed click to update   DO NOT delete brackets or number around this link :1}   Other Instructions      Signed, Kristeen Miss, MD  10/05/2022 5:47 PM    [facility]

## 2022-10-05 ENCOUNTER — Ambulatory Visit: Payer: Medicare Other | Attending: Cardiovascular Disease | Admitting: Cardiovascular Disease

## 2022-10-05 ENCOUNTER — Encounter: Payer: Self-pay | Admitting: Cardiovascular Disease

## 2022-10-05 VITALS — BP 140/100 | HR 75 | Ht 60.0 in | Wt 120.2 lb

## 2022-10-05 DIAGNOSIS — I10 Essential (primary) hypertension: Secondary | ICD-10-CM | POA: Diagnosis not present

## 2022-10-05 DIAGNOSIS — I4891 Unspecified atrial fibrillation: Secondary | ICD-10-CM

## 2022-10-05 MED ORDER — HYDROCHLOROTHIAZIDE 25 MG PO TABS: 25.0000 mg | ORAL_TABLET | Freq: Every day | ORAL | 2 refills | Status: DC

## 2022-10-05 MED ORDER — POTASSIUM CHLORIDE ER 10 MEQ PO TBCR: 10.0000 meq | EXTENDED_RELEASE_TABLET | Freq: Every day | ORAL | 2 refills | Status: DC

## 2022-10-05 NOTE — Patient Instructions (Signed)
Medication Instructions:  Your physician has recommended you make the following change in your medication:  Start hydrochlorothiazide 25 mg by mouth daily  Start potassium 10 meq by mouth daily   *If you need a refill on your cardiac medications before your next appointment, please call your pharmacy*   Lab Work: Your physician recommends that you return for lab work in: 3 weeks --BMP  Lab work to be done today --BMP and CBC   If you have labs (blood work) drawn today and your tests are completely normal, you will receive your results only by: [employer] (if you have [place]) OR A paper copy in the mail If you have any lab test that is abnormal or we need to change your treatment, we will call you to review the results.   Testing/Procedures: none   Follow-Up: At [facility], you and your health needs are our priority.  As part of our continuing mission to provide you with exceptional heart care, we have created designated Provider Care Teams.  These Care Teams include your primary Cardiologist (physician) and Advanced Practice Providers (APPs -  Physician Assistants and Nurse Practitioners) who all work together to provide you with the care you need, when you need it.  We recommend signing up for the patient portal called "MyChart".  Sign up information is provided on this After Visit Summary.  MyChart is used to connect with patients for Virtual Visits (Telemedicine).  Patients are able to view lab/test results, encounter notes, upcoming appointments, etc.  Non-urgent messages can be sent to your provider as well.   To learn more about what you can do with MyChart, go to [email or web address].    Your next appointment:   3 month(s)  Provider:   APP  If Card or EP not listed click to update   DO NOT delete brackets or number around this link :1}   Other Instructions

## 2022-10-06 LAB — CBC
Hematocrit: 44.1 % (ref 34.0–46.6)
Hemoglobin: 14.4 g/dL (ref 11.1–15.9)
MCH: 29.1 pg (ref 26.6–33.0)
MCHC: 32.7 g/dL (ref 31.5–35.7)
MCV: 89 fL (ref 79–97)
Platelets: 301 10*3/uL (ref 150–450)
RBC: 4.95 x10E6/uL (ref 3.77–5.28)
RDW: 13.2 % (ref 11.7–15.4)
WBC: 11.1 10*3/uL — ABNORMAL HIGH (ref 3.4–10.8)

## 2022-10-06 LAB — BASIC METABOLIC PANEL
BUN/Creatinine Ratio: 22 (ref 12–28)
BUN: 14 mg/dL (ref 8–27)
CO2: 24 mmol/L (ref 20–29)
Calcium: 9.8 mg/dL (ref 8.7–10.3)
Chloride: 103 mmol/L (ref 96–106)
Creatinine, Ser: 0.65 mg/dL (ref 0.57–1.00)
Glucose: 99 mg/dL (ref 70–99)
Potassium: 4.4 mmol/L (ref 3.5–5.2)
Sodium: 143 mmol/L (ref 134–144)
eGFR: 91 mL/min/{1.73_m2} (ref >59)

## 2022-10-07 NOTE — Telephone Encounter (Signed)
Patient Name: Hannah Downs  DOB: 07/29/1946 MRN: [id number]  Primary Cardiologist: Kristeen Miss, MD  Chart reviewed as part of pre-operative protocol coverage. Pre-op clearance already addressed by colleagues in earlier phone notes. To summarize recommendations:  -Needs to have a bladder tack soon  Larita Fife does not know if she wants to go though with it  She had 2 strokes this past year when she held her warfarin following surgery  We will bridge with Lovenox. She will be at low risk for her upcoming surgery if she would like     She is on ASA and warfarin .  I dont think she will need ASA long term  -Dr. Elease Hashimoto   Per office protocol, patient can hold warfarin for 5 days prior to procedure.     Patient WILL need bridging with Lovenox (enoxaparin) around procedure.   This can be coordinated by our coumadin clinic. Patient will need to let coumadin clinic know as soon as procedure is scheduled.  Will route this bundled recommendation to requesting provider via Epic fax function and remove from pre-op pool. Please call with questions.  Sharlene Dory, PA-C 10/07/2022, 3:21 PM

## 2022-10-11 ENCOUNTER — Ambulatory Visit: Payer: Medicare Other | Attending: Cardiovascular Disease

## 2022-10-11 DIAGNOSIS — I480 Paroxysmal atrial fibrillation: Secondary | ICD-10-CM

## 2022-10-11 DIAGNOSIS — Z5181 Encounter for therapeutic drug level monitoring: Secondary | ICD-10-CM | POA: Diagnosis not present

## 2022-10-11 DIAGNOSIS — I4891 Unspecified atrial fibrillation: Secondary | ICD-10-CM | POA: Diagnosis not present

## 2022-10-11 LAB — POCT INR: INR: 3.8 — AB (ref 2.0–3.0)

## 2022-10-11 NOTE — Patient Instructions (Signed)
HOLD TODAY ONLY THEN continue taking 1.5 tablets daily except 1 tablet on Fridays. Recheck INR in 6 weeks. Coumadin Clinic [phone]

## 2022-10-13 ENCOUNTER — Telehealth: Payer: Self-pay | Admitting: Obstetrics and Gynecology

## 2022-10-13 NOTE — Telephone Encounter (Signed)
Called and spoke to patient regarding thoughts around surgery, as practice have received clearance from Neuro and Cardiology.   Patient is still very hesitant and wants to hold off until end of summer or early fall. She states she is just extremely nervous about having another stroke during surgery. We discussed that this is understandable for her to have concerns. Patient plans to come for pessary checks and we will continue with this until she believes she is ready to pursue surgical plan.

## 2022-10-18 ENCOUNTER — Ambulatory Visit: Payer: Medicare Other | Admitting: Obstetrics and Gynecology

## 2022-10-18 ENCOUNTER — Encounter: Payer: Self-pay | Admitting: Obstetrics and Gynecology

## 2022-10-18 ENCOUNTER — Ambulatory Visit (INDEPENDENT_AMBULATORY_CARE_PROVIDER_SITE_OTHER): Payer: Medicare Other | Admitting: Obstetrics and Gynecology

## 2022-10-18 VITALS — BP 172/94 | HR 76

## 2022-10-18 DIAGNOSIS — N8110 Cystocele, unspecified: Secondary | ICD-10-CM

## 2022-10-18 DIAGNOSIS — R339 Retention of urine, unspecified: Secondary | ICD-10-CM

## 2022-10-18 DIAGNOSIS — N813 Complete uterovaginal prolapse: Secondary | ICD-10-CM | POA: Diagnosis not present

## 2022-10-18 DIAGNOSIS — Z4689 Encounter for fitting and adjustment of other specified devices: Secondary | ICD-10-CM

## 2022-10-18 DIAGNOSIS — N816 Rectocele: Secondary | ICD-10-CM

## 2022-10-18 NOTE — Patient Instructions (Signed)
Please bring estrogen to next appointment.

## 2022-10-18 NOTE — Progress Notes (Signed)
 Urogynecology   Subjective:     Chief Complaint:  Chief Complaint  Patient presents with   Pessary Check   History of Present Illness: Hannah Downs is a 77 y.o. female with stage IV pelvic organ prolapse who presents for a pessary check. She is using a size #6 cube pessary. The pessary has been working well and she has no complaints. She is using vaginal estrogen. She denies vaginal bleeding.  Past Medical History: Patient  has a past medical history of Arthritis, Dysrhythmia, GERD (gastroesophageal reflux disease), Hypercholesterolemia, Hypertension, Lumbar degenerative disc disease, Miscarriage, Osteopenia, Seasonal allergies, Stroke ([place]), Varicose vein, and Vitamin D deficiency.   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   Medications: She has a current medication list which includes the following prescription(s): acetaminophen, aspirin ec, atorvastatin, vitamin d3, estradiol, hydrochlorothiazide, metoprolol succinate, omeprazole, potassium chloride, trospium, and warfarin.   Allergies: Patient is allergic to lisinopril.   Social History: Patient  reports that she has never smoked. She has never used smokeless tobacco. She reports that she does not drink alcohol and does not use drugs.      Objective:    Physical Exam: BP (!) 172/94   Pulse 76  Gen: No apparent distress, A&O x 3. Detailed Urogynecologic Evaluation:  Pelvic Exam: Normal external female genitalia; Bartholin's and Skene's glands normal in appearance; urethral meatus with caruncle, no urethral masses or discharge. The pessary was noted to be in place. It was removed and cleaned and swabs with hibaclens were used  in the vagina to cleanse the area. Speculum exam revealed moderate erosion in the vagina on the back wall related to pessary use. The pessary was replaced. It was comfortable to the patient and fit well.    Assessment/Plan:    Assessment: Hannah Downs is a 77 y.o. with stage IV pelvic organ prolapse here for a pessary check. She is planning for surgery in August.   Plan: She will keep the pessary in place until next visit. She will continue to use estrogen. Will have her follow up monthly until August due to erosion in the vagina. Will have family bring estrogen cream so I can apply it to the back of the vagina before pessary re-insertion. She is unable to urinate without pessary use, and therefore cannot leave the pessary out. We have tried multiple different pessaries without success.    She will follow-up in 1 months for a pessary check or sooner as needed.  All questions were answered.

## 2022-10-26 ENCOUNTER — Other Ambulatory Visit: Payer: Self-pay | Admitting: Cardiovascular Disease

## 2022-10-26 ENCOUNTER — Other Ambulatory Visit: Payer: Medicare Other

## 2022-10-26 DIAGNOSIS — I480 Paroxysmal atrial fibrillation: Secondary | ICD-10-CM

## 2022-10-26 NOTE — Telephone Encounter (Signed)
Prescription refill request received for warfarin Lov: 10/05/2022 Next INR check: 3/11 Warfarin tablet strength: 82m

## 2022-11-01 ENCOUNTER — Encounter: Payer: Self-pay | Admitting: *Deleted

## 2022-11-05 ENCOUNTER — Ambulatory Visit: Payer: Medicare Other | Attending: Cardiovascular Disease

## 2022-11-05 DIAGNOSIS — I4891 Unspecified atrial fibrillation: Secondary | ICD-10-CM

## 2022-11-05 DIAGNOSIS — I10 Essential (primary) hypertension: Secondary | ICD-10-CM

## 2022-11-06 LAB — BASIC METABOLIC PANEL WITH GFR
BUN/Creatinine Ratio: 23 (ref 12–28)
BUN: 13 mg/dL (ref 8–27)
CO2: 24 mmol/L (ref 20–29)
Calcium: 9.5 mg/dL (ref 8.7–10.3)
Chloride: 95 mmol/L — ABNORMAL LOW (ref 96–106)
Creatinine, Ser: 0.57 mg/dL (ref 0.57–1.00)
Glucose: 98 mg/dL (ref 70–99)
Potassium: 4 mmol/L (ref 3.5–5.2)
Sodium: 134 mmol/L (ref 134–144)
eGFR: 94 mL/min/1.73

## 2022-11-12 ENCOUNTER — Other Ambulatory Visit: Payer: Self-pay | Admitting: Cardiovascular Disease

## 2022-11-15 ENCOUNTER — Encounter: Payer: Self-pay | Admitting: Obstetrics and Gynecology

## 2022-11-15 ENCOUNTER — Ambulatory Visit (INDEPENDENT_AMBULATORY_CARE_PROVIDER_SITE_OTHER): Payer: Medicare Other | Admitting: Obstetrics and Gynecology

## 2022-11-15 VITALS — BP 160/90 | HR 72

## 2022-11-15 DIAGNOSIS — N8110 Cystocele, unspecified: Secondary | ICD-10-CM

## 2022-11-15 DIAGNOSIS — N952 Postmenopausal atrophic vaginitis: Secondary | ICD-10-CM

## 2022-11-15 DIAGNOSIS — N816 Rectocele: Secondary | ICD-10-CM

## 2022-11-15 DIAGNOSIS — Z4689 Encounter for fitting and adjustment of other specified devices: Secondary | ICD-10-CM

## 2022-11-15 DIAGNOSIS — N813 Complete uterovaginal prolapse: Secondary | ICD-10-CM | POA: Diagnosis not present

## 2022-11-15 NOTE — Patient Instructions (Signed)
Come back in a month and please bring the estrogen cream next visit

## 2022-11-15 NOTE — Progress Notes (Signed)
[facility]   Subjective:     Chief Complaint:  Chief Complaint  Patient presents with   Pessary Check    Hannah Downs is a 77 y.o. female is here for pessary check.   History of Present Illness: Hannah Downs is a 77 y.o. female with stage IV pelvic organ prolapse who presents for a pessary check. She is using a size #6 cube pessary. The pessary has fallen out x4 times in the last month. She is using vaginal estrogen. She denies vaginal bleeding.  Past Medical History: Patient  has a past medical history of Arthritis, Dysrhythmia, GERD (gastroesophageal reflux disease), Hypercholesterolemia, Hypertension, Lumbar degenerative disc disease, Miscarriage, Osteopenia, Seasonal allergies, Stroke ([place]), Varicose vein, and Vitamin D deficiency.   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   Medications: She has a current medication list which includes the following prescription(s): acetaminophen, aspirin ec, atorvastatin, vitamin d3, estradiol, hydrochlorothiazide, metoprolol succinate, omeprazole, potassium chloride, trospium, and warfarin.   Allergies: Patient is allergic to lisinopril.   Social History: Patient  reports that she has never smoked. She has never used smokeless tobacco. She reports that she does not drink alcohol and does not use drugs.      Objective:    Physical Exam: BP (!) 160/91   Pulse 72  Gen: No apparent distress, A&O x 3. Detailed Urogynecologic Evaluation:  Pelvic Exam: Normal external female genitalia; Bartholin's and Skene's glands normal in appearance; urethral meatus with caruncle, no urethral masses or discharge. The pessary was noted to be in  place. It was removed and cleaned. Speculum exam revealed erosion in the vagina on the left and right side of the vaginal walls. The pessary was changed today to a size #7 cube [id number]) It was comfortable to the patient and fit well.     Assessment/Plan:    Assessment: Hannah Downs is a 77 y.o. with stage IV pelvic organ prolapse here for a pessary check. She is doing well.  Plan: She will keep the pessary in place until next visit. She will continue to use estrogen. She will follow-up in 4 weeks for a pessary check or sooner as needed.  All questions were answered.

## 2022-11-18 ENCOUNTER — Ambulatory Visit: Payer: Medicare Other | Admitting: Obstetrics and Gynecology

## 2022-11-22 ENCOUNTER — Ambulatory Visit: Payer: Medicare Other | Attending: Cardiovascular Disease | Admitting: *Deleted

## 2022-11-22 DIAGNOSIS — Z5181 Encounter for therapeutic drug level monitoring: Secondary | ICD-10-CM | POA: Diagnosis not present

## 2022-11-22 DIAGNOSIS — I4891 Unspecified atrial fibrillation: Secondary | ICD-10-CM | POA: Diagnosis not present

## 2022-11-22 LAB — POCT INR: POC INR: 3.4

## 2022-11-22 NOTE — Patient Instructions (Signed)
Description   HOLD TODAY  Then START taking 1.5 tablets daily except 1 tablet on Mondays and Fridays. Be consistent with vitamin K intake.  Recheck INR in 3 weeks. Coumadin Clinic [phone]

## 2022-12-09 ENCOUNTER — Other Ambulatory Visit: Payer: Self-pay | Admitting: Cardiovascular Disease

## 2022-12-13 ENCOUNTER — Ambulatory Visit: Payer: Medicare Other

## 2022-12-16 ENCOUNTER — Ambulatory Visit: Payer: Medicare Other | Admitting: Obstetrics and Gynecology

## 2022-12-20 ENCOUNTER — Ambulatory Visit: Payer: Medicare Other | Admitting: Obstetrics and Gynecology

## 2022-12-20 ENCOUNTER — Ambulatory Visit: Payer: Medicare Other | Attending: Internal Medicine | Admitting: *Deleted

## 2022-12-20 DIAGNOSIS — Z5181 Encounter for therapeutic drug level monitoring: Secondary | ICD-10-CM | POA: Diagnosis not present

## 2022-12-20 DIAGNOSIS — I4891 Unspecified atrial fibrillation: Secondary | ICD-10-CM | POA: Diagnosis not present

## 2022-12-20 LAB — POCT INR: POC INR: 3.4

## 2022-12-20 NOTE — Patient Instructions (Signed)
Description   HOLD TODAY  Then START taking 1.5 tablets daily except 1 tablet on Mondays, Wednesdays and Fridays. Be consistent with vitamin K intake.  Recheck INR in 3 weeks. Coumadin Clinic [phone]

## 2022-12-27 ENCOUNTER — Other Ambulatory Visit: Payer: Self-pay

## 2022-12-27 DIAGNOSIS — N3941 Urge incontinence: Secondary | ICD-10-CM

## 2022-12-27 MED ORDER — TROSPIUM CHLORIDE 20 MG PO TABS: 20.0000 mg | ORAL_TABLET | Freq: Two times a day (BID) | ORAL | 11 refills | Status: DC

## 2022-12-27 NOTE — Progress Notes (Signed)
Medication refill request from [facility] was faxed to the [facility] for Trospium 20mg  1 tab BID. Refill has been sent to the pharmacy.

## 2022-12-31 ENCOUNTER — Ambulatory Visit: Payer: Medicare Other | Attending: Cardiology | Admitting: *Deleted

## 2022-12-31 DIAGNOSIS — I4891 Unspecified atrial fibrillation: Secondary | ICD-10-CM | POA: Diagnosis not present

## 2022-12-31 DIAGNOSIS — Z5181 Encounter for therapeutic drug level monitoring: Secondary | ICD-10-CM | POA: Diagnosis not present

## 2022-12-31 LAB — POCT INR: POC INR: 3.7

## 2022-12-31 NOTE — Patient Instructions (Signed)
Description   Hold warfarin today and then START taking warfarin 1 tablet daily except for 1.5 tablets on Tuesday and Thursdays. Recheck INR in 3 weeks. [phone]

## 2023-01-03 DIAGNOSIS — D72829 Elevated white blood cell count, unspecified: Secondary | ICD-10-CM | POA: Diagnosis not present

## 2023-01-03 DIAGNOSIS — E785 Hyperlipidemia, unspecified: Secondary | ICD-10-CM | POA: Diagnosis not present

## 2023-01-03 DIAGNOSIS — I480 Paroxysmal atrial fibrillation: Secondary | ICD-10-CM | POA: Diagnosis not present

## 2023-01-03 DIAGNOSIS — I10 Essential (primary) hypertension: Secondary | ICD-10-CM | POA: Diagnosis not present

## 2023-01-03 DIAGNOSIS — Z8673 Personal history of transient ischemic attack (TIA), and cerebral infarction without residual deficits: Secondary | ICD-10-CM | POA: Diagnosis not present

## 2023-01-03 DIAGNOSIS — D6869 Other thrombophilia: Secondary | ICD-10-CM | POA: Diagnosis not present

## 2023-01-04 NOTE — Progress Notes (Signed)
Office Visit    Patient Name: Hannah Downs Date of Encounter: 01/04/2023  Primary Care Provider:  [name], MD Primary Cardiologist:  Hannah Miss, MD Primary Electrophysiologist: None   Past Medical History    Past Medical History:  Diagnosis Date   Arthritis    Dysrhythmia    afib   GERD (gastroesophageal reflux disease)    Hypercholesterolemia    Hypertension    Lumbar degenerative disc disease    Miscarriage    times 3   Osteopenia    Seasonal allergies    Stroke [facility])    Varicose vein    right leg   Vitamin D deficiency    Past Surgical History:  Procedure Laterality Date   DILATION AND CURETTAGE OF UTERUS  1998   hx of approx 26 years ago   FEMORAL HERNIA REPAIR  11/02/2006   Emergent reduction/primary repair.  Dr Hannah Downs   INGUINAL HERNIA REPAIR  02/03/2007   open w mesh.  Dr Hannah Downs   INGUINAL HERNIA REPAIR N/A 11/04/2021   Procedure: lysis of adhesions reduction of incarcerated recurrent left femoral hernia laparoscopic repair. laparoscopic right femoral hernia repair and right obturator repair with mesh. tap block;  Surgeon: Hannah Soda, MD;  Location: [facility];  Service: General;  Laterality: N/A;   ROBOTIC ASSISTED DIAGNOSTIC LAPAROSCOPY  09/18/2021   robotic reduction/partial takedown of recurrent inguinal hernia   TOTAL HIP ARTHROPLASTY Right 06/26/2014   Procedure: RIGHT TOTAL HIP ARTHROPLASTY ANTERIOR APPROACH WITH ACETABULAR AUTOGRAFT;  Surgeon: Hannah Drilling, MD;  Location: [facility];  Service: Orthopedics;  Laterality: Right;    Allergies  Allergies  Allergen Reactions   Lisinopril Cough     History of Present Illness    Hannah Downs  is a 77 year old female with a PMH of PAF (on Coumadin), HLD, 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  [name] was seen initially in  by Dr. Azucena Cecil for complaint of palpitations.   She was given 2-week ZIO monitor that showed atrial fibrillation with less than 1% burden.  Metoprolol succinate was increased and patient was started on Eliquis initially and transition to Coumadin.  She underwent an urgent hernia repair 10/2021 due to incarcerated left femoral artery followed by [facility].  She experienced 2 strokes when holding warfarin for surgery.  She will require Lovenox bridging for any surgical procedures in the future.  [name] presents today with her husband for 1 month follow-up of hypertension.  Since last being seen in the office patient reports she has been doing well with no acute episodes of atrial fibrillation or palpitations.  Her blood pressure today is well-controlled at 138/84 and is in the 120s systolically at home.  She reports compliance with her current medications and denies any adverse reactions.  She is staying active and walks her vehicle daily multiple times.  She is euvolemic on examination and denies any shortness of breath or orthopnea.  She is still considering surgery but currently has no plans to continue at this time with any scheduled procedures.  Patient denies chest pain, palpitations, dyspnea, PND, orthopnea, nausea, vomiting, dizziness, syncope, edema, weight gain, or early satiety.  Home Medications    Current Outpatient Medications  Medication Sig Dispense Refill   acetaminophen (TYLENOL) 500 MG tablet Take 1,000 mg by mouth every 6 (six) hours as needed for moderate pain.     aspirin EC  81 MG tablet Take 81 mg by mouth daily. Swallow whole.     atorvastatin (LIPITOR) 20 MG tablet Take 1 tablet by mouth once daily 90 tablet 3   Cholecalciferol (VITAMIN D3) 50 MCG (2000 UT) TABS Take 2,000 Units by mouth in the morning.     estradiol (ESTRACE) 0.1 MG/GM vaginal cream Place 0.5 g vaginally 2 (two) times a week. Place 0.5g nightly twice a week 30 g 11   hydrochlorothiazide (HYDRODIURIL) 25 MG tablet Take 1 tablet (25 mg total) by mouth daily. 90  tablet 2   metoprolol succinate (TOPROL-XL) 100 MG 24 hr tablet TAKE 1 & 1/2 (ONE & ONE-HALF) TABLETS BY MOUTH ONCE DAILY (TAKE  WITH  OR  IMMEDIATELY  FOLLOWING  A  MEAL) 135 tablet 3   omeprazole (PRILOSEC OTC) 20 MG tablet Take 20 mg by mouth daily.     potassium chloride (KLOR-CON) 10 MEQ tablet Take 1 tablet (10 mEq total) by mouth daily. 90 tablet 2   trospium (SANCTURA) 20 MG tablet Take 1 tablet (20 mg total) by mouth 2 (two) times daily. 30 tablet 11   warfarin (COUMADIN) 5 MG tablet TAKE 1 & 1/2 (ONE & ONE-HALF) TABLETS BY MOUTH ONCE DAILY, EXCEPT TAKE 1 TABLET BY MOUTH ON FRIDAYS OR AS DIRECTED BY ANTICOAGULATION CLINIC 45 tablet 1   No current facility-administered medications for this visit.     Review of Systems  Please see the history of present illness.    (+) Trace lower extremity edema   All other systems reviewed and are otherwise negative except as noted above.  Physical Exam    Wt Readings from Last 3 Encounters:  10/05/22 120 lb 3.2 oz (54.5 kg)  09/21/22 117 lb (53.1 kg)  02/09/22 113 lb 9.6 oz (51.5 kg)   ZO:XWRUE were no vitals filed for this visit.,There is no height or weight on file to calculate BMI.  Constitutional:      Appearance: Healthy appearance. Not in distress.  Neck:     Vascular: JVD normal.  Pulmonary:     Effort: Pulmonary effort is normal.     Breath sounds: No wheezing. No rales. Diminished in the bases Cardiovascular:     Normal rate. Regular rhythm. Normal S1. Normal S2.      Murmurs: There is no murmur.  Edema:    Peripheral edema absent.  Abdominal:     Palpations: Abdomen is soft non tender. There is no hepatomegaly.  Skin:    General: Skin is warm and dry.  Neurological:     General: No focal deficit present.     Mental Status: Alert and oriented to person, place and time.     Cranial Nerves: Cranial nerves are intact.  EKG/LABS/ Recent Cardiac Studies    ECG personally reviewed by me today -none completed  today  Cardiac Studies & Procedures       ECHOCARDIOGRAM  ECHOCARDIOGRAM COMPLETE 11/08/2021  Narrative ECHOCARDIOGRAM REPORT    Patient Name:   Hannah Downs Date of Exam: 11/08/2021 Medical Rec #:  [id number]           Height:       61.0 in Accession #:    [id number]          Weight:       116.0 lb Date of Birth:  [birth year]           BSA:          1.498 m Patient Age:  76 years            BP:           154/101 mmHg Patient Gender: F                   HR:           80 bpm. Exam Location:  Inpatient  Procedure: 2D Echo, Cardiac Doppler and Color Doppler  Indications:    CVA  History:        Patient has prior history of Echocardiogram examinations, most recent 04/01/2020. Risk Factors:Hypertension and Dyslipidemia.  Sonographer:    Neomia Dear RDCS Referring Phys: [phone] A CALDWELL POWELL JR   Sonographer Comments: No parasternal window, suboptimal apical window and suboptimal subcostal window. Image acquisition challenging due to patient body habitus. IMPRESSIONS   1. Technically difficult; no parasternal views. 2. Left ventricular ejection fraction, by estimation, is 60 to 65%. The left ventricle has normal function. The left ventricle has no regional wall motion abnormalities. Left ventricular diastolic parameters are consistent with Grade II diastolic dysfunction (pseudonormalization). 3. Right ventricular systolic function is normal. The right ventricular size is normal. 4. Left atrial size was mildly dilated. 5. Right atrial size was mildly dilated. 6. The mitral valve is normal in structure. Trivial mitral valve regurgitation. No evidence of mitral stenosis. 7. The aortic valve was not assessed. Aortic valve regurgitation is not visualized. No aortic stenosis is present. 8. Pulmonic valve regurgitation Not assessed. 9. The inferior vena cava is normal in size with greater than 50% respiratory variability, suggesting right atrial pressure of 3  mmHg.  FINDINGS Left Ventricle: Left ventricular ejection fraction, by estimation, is 60 to 65%. The left ventricle has normal function. The left ventricle has no regional wall motion abnormalities. The left ventricular internal cavity size was normal in size. There is no left ventricular hypertrophy. Left ventricular diastolic parameters are consistent with Grade II diastolic dysfunction (pseudonormalization).  Right Ventricle: The right ventricular size is normal. Right ventricular systolic function is normal.  Left Atrium: Left atrial size was mildly dilated.  Right Atrium: Right atrial size was mildly dilated.  Pericardium: There is no evidence of pericardial effusion.  Mitral Valve: The mitral valve is normal in structure. Trivial mitral valve regurgitation. No evidence of mitral valve stenosis. MV peak gradient, 2.4 mmHg. The mean mitral valve gradient is 1.0 mmHg.  Tricuspid Valve: The tricuspid valve is normal in structure. Tricuspid valve regurgitation is not demonstrated. No evidence of tricuspid stenosis.  Aortic Valve: The aortic valve was not assessed. Aortic valve regurgitation is not visualized. No aortic stenosis is present. Aortic valve mean gradient measures 4.0 mmHg. Aortic valve peak gradient measures 8.1 mmHg.  Pulmonic Valve: The pulmonic valve was not assessed. Pulmonic valve regurgitation Not assessed.  Aorta: Aortic root could not be assessed.  Venous: The inferior vena cava is normal in size with greater than 50% respiratory variability, suggesting right atrial pressure of 3 mmHg.  IAS/Shunts: The interatrial septum was not well visualized.  Additional Comments: Technically difficult; no parasternal views.    LV Volumes (MOD) LV vol d, MOD A2C: 22.4 ml Diastology LV vol d, MOD A4C: 31.8 ml LV e' medial:    6.93 cm/s LV vol s, MOD A2C: 10.8 ml LV E/e' medial:  10.4 LV vol s, MOD A4C: 16.7 ml LV e' lateral:   6.25 cm/s LV SV MOD A2C:     11.6 ml LV E/e'  lateral: 11.6 LV SV MOD A4C:  31.8 ml LV SV MOD BP:      14.6 ml  RIGHT VENTRICLE RV Basal diam:  3.90 cm RV Mid diam:    2.10 cm RV S prime:     26.80 cm/s TAPSE (M-mode): 3.3 cm  LEFT ATRIUM           Index        RIGHT ATRIUM           Index LA Vol (A2C): 42.4 ml 28.30 ml/m  RA Area:     17.30 cm RA Volume:   43.60 ml  29.10 ml/m AORTIC VALVE AV Vmax:           142.00 cm/s AV Vmean:          95.100 cm/s AV VTI:            0.235 m AV Peak Grad:      8.1 mmHg AV Mean Grad:      4.0 mmHg LVOT Vmax:         110.00 cm/s LVOT Vmean:        71.000 cm/s LVOT VTI:          0.176 m LVOT/AV VTI ratio: 0.75  MITRAL VALVE MV Peak grad: 2.4 mmHg     SHUNTS MV Mean grad: 1.0 mmHg     Systemic VTI: 0.18 m MV Vmax:      0.78 m/s MV Vmean:     56.5 cm/s MV E velocity: 72.30 cm/s MV A velocity: 51.90 cm/s MV E/A ratio:  1.39  Olga Millers MD Electronically signed by Olga Millers MD Signature Date/Time: 11/08/2021/11:09:01 AM    Final    MONITORS  LONG TERM MONITOR (3-14 DAYS) 03/26/2020  Narrative Cardiac monitor showed paroxysmal atrial fibrillation ( less than 1% burden) with HR 92-142bpm. Atrial fibrillation was associated with patient triggered events.           Risk Assessment/Calculations:    CHA2DS2-VASc Score = 7   This indicates a 11.2% annual risk of stroke. The patient's score is based upon: CHF History: 0 HTN History: 1 Diabetes History: 0 Stroke History: 2 Vascular Disease History: 1 Age Score: 2 Gender Score: 1           Lab Results  Component Value Date   WBC 11.1 (H) 10/05/2022   HGB 14.4 10/05/2022   HCT 44.1 10/05/2022   MCV 89 10/05/2022   PLT 301 10/05/2022   Lab Results  Component Value Date   CREATININE 0.57 11/05/2022   BUN 13 11/05/2022   NA 134 11/05/2022   K 4.0 11/05/2022   CL 95 (L) 11/05/2022   CO2 24 11/05/2022   Lab Results  Component Value Date   ALT 77 (H) 11/12/2021   AST 84 (H) 11/12/2021   ALKPHOS 56  11/12/2021   BILITOT 1.2 11/12/2021   Lab Results  Component Value Date   CHOL 109 11/08/2021   HDL 55 11/08/2021   LDLCALC 43 11/08/2021   TRIG 55 11/08/2021   CHOLHDL 2.0 11/08/2021    Lab Results  Component Value Date   HGBA1C 5.4 11/08/2021     Assessment & Plan    1.  Essential hypertension: -Patient's blood pressures were noted to be elevated at follow-up on 10/05/2022 and HCTZ 25 mg was restarted. -Today patient's blood pressure is well-controlled at 138/84 -Continue HCTZ 25 mg daily and Toprol-XL 100 mg daily  2.  Paroxysmal AF: -Patient currently on rate control with Toprol XL 100 mg daily -She  is currently on Coumadin and managed by Coumadin clinic -Today patient reports no breakthrough palpitations or episodes of atrial fibrillation -CHA2DS2-VASc Score = 7 [CHF History: 0, HTN History: 1, Diabetes History: 0, Stroke History: 2, Vascular Disease History: 1, Age Score: 2, Gender Score: 1].  Therefore, the patient's annual risk of stroke is 11.2 %.      3.  History of CVA: -Patient had previous embolic strokes while holding anticoagulation for emergent surgeries. -Continue GDMT with ASA 81 mg and Lipitor 20 mg daily  4.  Hyperlipidemia: -Patient's LDL cholesterol was 76 -Continue Lipitor 20 mg daily  Disposition: Follow-up with Hannah Miss, MD or APP in 12 months    Medication Adjustments/Labs and Tests Ordered: Current medicines are reviewed at length with the patient today.  Concerns regarding medicines are outlined above.   Signed, Napoleon Form, Leodis Rains, NP 01/04/2023, 7:52 PM [facility]

## 2023-01-05 ENCOUNTER — Encounter: Payer: Self-pay | Admitting: Nurse Practitioner

## 2023-01-05 ENCOUNTER — Ambulatory Visit: Payer: Medicare Other | Attending: Nurse Practitioner | Admitting: Nurse Practitioner

## 2023-01-05 VITALS — BP 138/84 | HR 73 | Ht 60.0 in | Wt 122.8 lb

## 2023-01-05 DIAGNOSIS — I10 Essential (primary) hypertension: Secondary | ICD-10-CM | POA: Diagnosis not present

## 2023-01-05 DIAGNOSIS — I639 Cerebral infarction, unspecified: Secondary | ICD-10-CM

## 2023-01-05 DIAGNOSIS — E7800 Pure hypercholesterolemia, unspecified: Secondary | ICD-10-CM

## 2023-01-05 DIAGNOSIS — I480 Paroxysmal atrial fibrillation: Secondary | ICD-10-CM | POA: Diagnosis not present

## 2023-01-05 NOTE — Patient Instructions (Signed)
Medication Instructions:  Your physician recommends that you continue on your current medications as directed. Please refer to the Current Medication list given to you today.  *If you need a refill on your cardiac medications before your next appointment, please call your pharmacy*   Lab Work: None ordered  If you have labs (blood work) drawn today and your tests are completely normal, you will receive your results only by: MyChart Message (if you have MyChart) OR A paper copy in the mail If you have any lab test that is abnormal or we need to change your treatment, we will call you to review the results.   Testing/Procedures: None ordered   Follow-Up: At [facility], you and your health needs are our priority.  As part of our continuing mission to provide you with exceptional heart care, we have created designated Provider Care Teams.  These Care Teams include your primary Cardiologist (physician) and Advanced Practice Providers (APPs -  Physician Assistants and Nurse Practitioners) who all work together to provide you with the care you need, when you need it.  We recommend signing up for the patient portal called "MyChart".  Sign up information is provided on this After Visit Summary.  MyChart is used to connect with patients for Virtual Visits (Telemedicine).  Patients are able to view lab/test results, encounter notes, upcoming appointments, etc.  Non-urgent messages can be sent to your provider as well.   To learn more about what you can do with MyChart, go to [email or web address].    Your next appointment:   12 month(s)  Provider:   Philip Nahser, MD     Other Instructions  

## 2023-01-12 ENCOUNTER — Other Ambulatory Visit: Payer: Self-pay | Admitting: Cardiovascular Disease

## 2023-01-12 DIAGNOSIS — I480 Paroxysmal atrial fibrillation: Secondary | ICD-10-CM

## 2023-01-12 NOTE — Progress Notes (Unsigned)
[facility]   Subjective:     Chief Complaint: No chief complaint on file.  History of Present Illness: Hannah Downs is a 77 y.o. female with stage IV pelvic organ prolapse who presents for a pessary check. She is using a size #7 cube pessary. The pessary has been working well and she has no complaints. She is using vaginal estrogen. She denies vaginal bleeding.  Past Medical History: Patient  has a past medical history of Arthritis, Dysrhythmia, GERD (gastroesophageal reflux disease), Hypercholesterolemia, Hypertension, Lumbar degenerative disc disease, Miscarriage, Osteopenia, Seasonal allergies, Stroke (HCC), Varicose vein, and Vitamin D deficiency.   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   Medications: She has a current medication list which includes the following prescription(s): acetaminophen, aspirin ec, atorvastatin, vitamin d3, estradiol, hydrochlorothiazide, metoprolol succinate, omeprazole, potassium chloride, trospium, warfarin, and warfarin.   Allergies: Patient is allergic to lisinopril.   Social History: Patient  reports that she has never smoked. She has never used smokeless tobacco. She reports that she does not drink alcohol and does not use drugs.      Objective:    Physical Exam: There were no vitals taken for this visit. Gen: No apparent distress, A&O x 3. Detailed Urogynecologic Evaluation:  Pelvic Exam: Normal external female genitalia; Bartholin's and Skene's glands normal in appearance; urethral meatus {urethra:24773}, no urethral masses or discharge. The pessary was noted to be {in place:24774}. It was removed and cleaned. Speculum exam revealed  {vaginal lesions:24775} in the vagina. The pessary was replaced. It was comfortable to the patient and fit well.    Laboratory Results: Urine dipstick shows: {ua dip:315374::"negative for all components"}.    Assessment/Plan:    Assessment: Hannah Downs is a 77 y.o. with {PFD symptoms:24771} here for a pessary check. She is doing well.  Plan: She will {pessary plan:24776}. She will continue to use {lubricant:24777}. She will follow-up in *** {days/wks/mos/yrs:310907} for a pessary check or sooner as needed.  All questions were answered.   Time Spent:

## 2023-01-12 NOTE — Telephone Encounter (Signed)
Prescription refill request received for warfarin Lov: 01/05/23 Louanne Skye)  Next INR check: 5/10/224 Warfarin tablet strength: 5mg   Appropriate dose. Refill sent.

## 2023-01-13 ENCOUNTER — Ambulatory Visit (INDEPENDENT_AMBULATORY_CARE_PROVIDER_SITE_OTHER): Payer: Medicare Other | Admitting: Obstetrics and Gynecology

## 2023-01-13 ENCOUNTER — Encounter: Payer: Self-pay | Admitting: Obstetrics and Gynecology

## 2023-01-13 VITALS — BP 134/78 | HR 76

## 2023-01-13 DIAGNOSIS — N816 Rectocele: Secondary | ICD-10-CM

## 2023-01-13 DIAGNOSIS — N813 Complete uterovaginal prolapse: Secondary | ICD-10-CM | POA: Diagnosis not present

## 2023-01-13 DIAGNOSIS — N952 Postmenopausal atrophic vaginitis: Secondary | ICD-10-CM | POA: Diagnosis not present

## 2023-01-13 DIAGNOSIS — N8110 Cystocele, unspecified: Secondary | ICD-10-CM

## 2023-01-13 DIAGNOSIS — Z4689 Encounter for fitting and adjustment of other specified devices: Secondary | ICD-10-CM

## 2023-01-13 MED ORDER — TROSPIUM CHLORIDE 20 MG PO TABS: 20.0000 mg | ORAL_TABLET | Freq: Two times a day (BID) | ORAL | 11 refills | Status: DC

## 2023-01-13 NOTE — Patient Instructions (Signed)
Take care of Rubik and I have changed the prescription for the Trospium for 20mg  twice a day.

## 2023-01-17 ENCOUNTER — Ambulatory Visit: Payer: Medicare Other | Admitting: Obstetrics and Gynecology

## 2023-01-21 ENCOUNTER — Ambulatory Visit: Payer: Medicare Other | Attending: Cardiovascular Disease | Admitting: *Deleted

## 2023-01-21 DIAGNOSIS — Z5181 Encounter for therapeutic drug level monitoring: Secondary | ICD-10-CM | POA: Diagnosis not present

## 2023-01-21 DIAGNOSIS — I4891 Unspecified atrial fibrillation: Secondary | ICD-10-CM | POA: Diagnosis not present

## 2023-01-21 LAB — POCT INR: POC INR: 3.4

## 2023-01-21 NOTE — Patient Instructions (Signed)
Description   Hold warfarin today and then START taking warfarin 1 tablet daily except for 1.5 tablets on Tuesdays. Recheck INR in 3 weeks. [phone]

## 2023-02-11 ENCOUNTER — Ambulatory Visit: Payer: Medicare Other | Attending: Cardiology

## 2023-02-11 DIAGNOSIS — Z5181 Encounter for therapeutic drug level monitoring: Secondary | ICD-10-CM | POA: Insufficient documentation

## 2023-02-11 DIAGNOSIS — I4891 Unspecified atrial fibrillation: Secondary | ICD-10-CM | POA: Diagnosis not present

## 2023-02-11 DIAGNOSIS — I480 Paroxysmal atrial fibrillation: Secondary | ICD-10-CM | POA: Insufficient documentation

## 2023-02-11 LAB — POCT INR: INR: 3.1 — AB (ref 2.0–3.0)

## 2023-02-11 NOTE — Patient Instructions (Signed)
Continue  taking warfarin 1 tablet daily except for 1.5 tablets on Tuesdays. Recheck INR in 6 weeks. [phone]  Eat greens tonight

## 2023-02-12 ENCOUNTER — Other Ambulatory Visit: Payer: Self-pay | Admitting: Obstetrics and Gynecology

## 2023-02-12 DIAGNOSIS — N952 Postmenopausal atrophic vaginitis: Secondary | ICD-10-CM

## 2023-02-15 ENCOUNTER — Ambulatory Visit: Payer: Medicare Other | Admitting: Obstetrics and Gynecology

## 2023-02-27 ENCOUNTER — Other Ambulatory Visit: Payer: Self-pay | Admitting: Cardiovascular Disease

## 2023-02-27 DIAGNOSIS — I480 Paroxysmal atrial fibrillation: Secondary | ICD-10-CM

## 2023-03-15 ENCOUNTER — Ambulatory Visit (INDEPENDENT_AMBULATORY_CARE_PROVIDER_SITE_OTHER): Payer: Medicare Other | Admitting: Obstetrics and Gynecology

## 2023-03-15 ENCOUNTER — Encounter: Payer: Self-pay | Admitting: Obstetrics and Gynecology

## 2023-03-15 VITALS — BP 148/87 | HR 79

## 2023-03-15 DIAGNOSIS — N898 Other specified noninflammatory disorders of vagina: Secondary | ICD-10-CM

## 2023-03-15 DIAGNOSIS — N816 Rectocele: Secondary | ICD-10-CM

## 2023-03-15 DIAGNOSIS — N8110 Cystocele, unspecified: Secondary | ICD-10-CM

## 2023-03-15 DIAGNOSIS — N813 Complete uterovaginal prolapse: Secondary | ICD-10-CM | POA: Diagnosis not present

## 2023-03-15 DIAGNOSIS — Z4689 Encounter for fitting and adjustment of other specified devices: Secondary | ICD-10-CM

## 2023-03-15 DIAGNOSIS — N952 Postmenopausal atrophic vaginitis: Secondary | ICD-10-CM

## 2023-03-15 NOTE — Progress Notes (Signed)
[facility]   Subjective:     Chief Complaint:  Chief Complaint  Patient presents with   Pessary Check   History of Present Illness: Hannah Downs is a 77 y.o. female with stage IV pelvic organ prolapse who presents for a pessary check. She is using a size #7 cube pessary. The pessary has been working well but she endorses bleeding. She is using vaginal estrogen. She endorses heavy vaginal bleeding yesterday, not like a period she states but heavier than normal. She reports her blood has been "too thin" recently with an elevated INR.   Past Medical History: Patient  has a past medical history of Arthritis, Dysrhythmia, GERD (gastroesophageal reflux disease), Hypercholesterolemia, Hypertension, Lumbar degenerative disc disease, Miscarriage, Osteopenia, Seasonal allergies, Stroke (HCC), Varicose vein, and Vitamin D deficiency.   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   Medications: She has a current medication list which includes the following prescription(s): acetaminophen, aspirin ec, atorvastatin, vitamin d3, estradiol, hydrochlorothiazide, metoprolol succinate, omeprazole, potassium chloride, trospium, and warfarin.   Allergies: Patient is allergic to lisinopril.   Social History: Patient  reports that she has never smoked. She has never used smokeless tobacco. She reports that she does not drink alcohol and does not use drugs.      Objective:    Physical Exam: BP (!) 148/87   Pulse 79  Gen: No apparent distress, A&O x 3. Detailed Urogynecologic Evaluation:  Pelvic Exam: Normal external female genitalia; Bartholin's and Skene's glands normal in appearance; urethral meatus  with caruncle, no urethral masses or discharge. The pessary was noted to be in place. It was removed and cleaned. Speculum exam revealed erosion in the vagina. Silver nitrate needed to stop bleeding at the vaginal apex. The pessary was replaced. Estorgen cream applied to the back of pessary today before reinsertion.  It was comfortable to the patient and fit well.    Assessment/Plan:    Assessment: Hannah Downs is a 77 y.o. with stage IV pelvic organ prolapse here for a pessary check. She is doing well.  Plan: She will keep the pessary in place until next visit. She will continue to use estrogen. She will follow-up in 1 months for a pessary check due to her bleeding or sooner as needed.   All questions were answered.

## 2023-03-25 ENCOUNTER — Ambulatory Visit: Payer: Medicare Other | Attending: Cardiovascular Disease

## 2023-03-25 DIAGNOSIS — I4891 Unspecified atrial fibrillation: Secondary | ICD-10-CM | POA: Diagnosis not present

## 2023-03-25 DIAGNOSIS — Z5181 Encounter for therapeutic drug level monitoring: Secondary | ICD-10-CM | POA: Diagnosis not present

## 2023-03-25 DIAGNOSIS — I480 Paroxysmal atrial fibrillation: Secondary | ICD-10-CM | POA: Diagnosis not present

## 2023-03-25 LAB — POCT INR: INR: 2.9 (ref 2.0–3.0)

## 2023-03-25 NOTE — Patient Instructions (Signed)
Continue  taking warfarin 1 tablet daily except for 1.5 tablets on Tuesdays. Recheck INR in 6 weeks. [phone]

## 2023-03-27 ENCOUNTER — Other Ambulatory Visit: Payer: Self-pay | Admitting: Obstetrics and Gynecology

## 2023-03-27 DIAGNOSIS — N952 Postmenopausal atrophic vaginitis: Secondary | ICD-10-CM

## 2023-03-28 ENCOUNTER — Other Ambulatory Visit: Payer: Self-pay | Admitting: Cardiovascular Disease

## 2023-03-28 DIAGNOSIS — I480 Paroxysmal atrial fibrillation: Secondary | ICD-10-CM

## 2023-03-30 ENCOUNTER — Other Ambulatory Visit: Payer: Self-pay | Admitting: Cardiovascular Disease

## 2023-03-30 DIAGNOSIS — I480 Paroxysmal atrial fibrillation: Secondary | ICD-10-CM

## 2023-04-06 ENCOUNTER — Ambulatory Visit: Payer: Medicare Other | Admitting: Adult Health

## 2023-04-12 ENCOUNTER — Ambulatory Visit: Payer: Medicare Other | Admitting: Obstetrics and Gynecology

## 2023-04-12 ENCOUNTER — Encounter: Payer: Self-pay | Admitting: Obstetrics and Gynecology

## 2023-04-12 VITALS — BP 175/83 | HR 76

## 2023-04-12 DIAGNOSIS — N816 Rectocele: Secondary | ICD-10-CM

## 2023-04-12 DIAGNOSIS — Z4689 Encounter for fitting and adjustment of other specified devices: Secondary | ICD-10-CM

## 2023-04-12 DIAGNOSIS — N952 Postmenopausal atrophic vaginitis: Secondary | ICD-10-CM | POA: Diagnosis not present

## 2023-04-12 DIAGNOSIS — N8110 Cystocele, unspecified: Secondary | ICD-10-CM

## 2023-04-12 DIAGNOSIS — N813 Complete uterovaginal prolapse: Secondary | ICD-10-CM

## 2023-04-12 NOTE — Progress Notes (Signed)
[facility]   Subjective:     Chief Complaint:  Chief Complaint  Patient presents with   Pessary Check    Hannah Downs is a 77 y.o. female is here for pessary check.   History of Present Illness: Hannah Downs is a 77 y.o. female with stage IV pelvic organ prolapse who presents for a pessary check. She is using a size #7 cube pessary. The pessary has been working well and she has no complaints. She is using vaginal estrogen. She denies vaginal bleeding.  Past Medical History: Patient  has a past medical history of Arthritis, Dysrhythmia, GERD (gastroesophageal reflux disease), Hypercholesterolemia, Hypertension, Lumbar degenerative disc disease, Miscarriage, Osteopenia, Seasonal allergies, Stroke (HCC), Varicose vein, and Vitamin D deficiency.   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   Medications: She has a current medication list which includes the following prescription(s): acetaminophen, aspirin ec, atorvastatin, vitamin d3, estradiol, hydrochlorothiazide, metoprolol succinate, omeprazole, potassium chloride, trospium, and warfarin.   Allergies: Patient is allergic to lisinopril.   Social History: Patient  reports that she has never smoked. She has never used smokeless tobacco. She reports that she does not drink alcohol and does not use drugs.      Objective:    Physical Exam: BP (!) 175/83   Pulse 76  Gen: No apparent distress, A&O x 3. Detailed Urogynecologic Evaluation:  Pelvic Exam: Normal external female genitalia; Bartholin's and Skene's glands normal in appearance; urethral meatus prolapsed, no urethral masses or discharge. The pessary was noted to be in  place. It was removed and cleaned. Speculum exam revealed erosion in the vagina. The pessary was replaced. It was comfortable to the patient and fit well.    Assessment/Plan:    Assessment: Hannah Downs is a 77 y.o. with stage IV pelvic organ prolapse here for a pessary check. She is doing well.  Plan: She will keep the pessary in place until next visit. She will continue to use estrogen. She will follow-up in 2 months for a pessary check or sooner as needed.   Patient has decided to move forward with doing surgery in October. Message sent to surgery scheduler.   All questions were answered.

## 2023-05-06 ENCOUNTER — Ambulatory Visit: Payer: Medicare Other

## 2023-05-09 ENCOUNTER — Ambulatory Visit: Payer: Medicare Other

## 2023-05-20 ENCOUNTER — Ambulatory Visit: Payer: Medicare Other | Attending: Cardiology

## 2023-05-20 ENCOUNTER — Telehealth (HOSPITAL_BASED_OUTPATIENT_CLINIC_OR_DEPARTMENT_OTHER): Payer: Self-pay | Admitting: *Deleted

## 2023-05-20 DIAGNOSIS — I480 Paroxysmal atrial fibrillation: Secondary | ICD-10-CM | POA: Diagnosis not present

## 2023-05-20 DIAGNOSIS — Z5181 Encounter for therapeutic drug level monitoring: Secondary | ICD-10-CM | POA: Insufficient documentation

## 2023-05-20 DIAGNOSIS — I4891 Unspecified atrial fibrillation: Secondary | ICD-10-CM | POA: Diagnosis not present

## 2023-05-20 LAB — POCT INR: INR: 2.6 (ref 2.0–3.0)

## 2023-05-20 NOTE — Patient Instructions (Signed)
Continue  taking warfarin 1 tablet daily except for 1.5 tablets on Tuesdays. Recheck INR in 3 weeks. [phone]

## 2023-05-20 NOTE — Telephone Encounter (Signed)
Called Hannah Downs to let her know that she will need to reach out to coumadin clinic for instructions on bridging to lovenox prior to surgery. Hannah Downs will call and let them know when her surgery is scheduled and set up appt to discuss the change.

## 2023-06-01 ENCOUNTER — Encounter: Payer: Self-pay | Admitting: Obstetrics and Gynecology

## 2023-06-01 ENCOUNTER — Ambulatory Visit (INDEPENDENT_AMBULATORY_CARE_PROVIDER_SITE_OTHER): Payer: Medicare Other | Admitting: Obstetrics and Gynecology

## 2023-06-01 VITALS — BP 154/88 | HR 76

## 2023-06-01 DIAGNOSIS — Z01818 Encounter for other preprocedural examination: Secondary | ICD-10-CM

## 2023-06-01 MED ORDER — TRAMADOL HCL 50 MG PO TABS
50.0000 mg | ORAL_TABLET | Freq: Three times a day (TID) | ORAL | 0 refills | Status: AC | PRN
Start: 2023-06-01 — End: 2023-06-06

## 2023-06-01 MED ORDER — POLYETHYLENE GLYCOL 3350 17 GM/SCOOP PO POWD
17.0000 g | Freq: Every day | ORAL | 0 refills | Status: DC
Start: 2023-06-01 — End: 2023-06-21

## 2023-06-01 MED ORDER — ACETAMINOPHEN 500 MG PO TABS
500.0000 mg | ORAL_TABLET | Freq: Four times a day (QID) | ORAL | 0 refills | Status: DC | PRN
Start: 2023-06-01 — End: 2023-08-16

## 2023-06-01 MED ORDER — IBUPROFEN 600 MG PO TABS
600.0000 mg | ORAL_TABLET | Freq: Four times a day (QID) | ORAL | 0 refills | Status: DC | PRN
Start: 2023-06-01 — End: 2023-06-21

## 2023-06-01 NOTE — Progress Notes (Signed)
[facility] Pre-Operative Exam  Subjective Chief Complaint: Hannah Downs presents for a preoperative encounter.   History of Present Illness: Hannah Downs is a 77 y.o. female who presents for preoperative visit.  She is scheduled to undergo Exam under anesthesia, LeForte Colpocleisis, levator plication with perineorrhaphy, cystoscopy   on 06/20/23.  Her symptoms include Pelvic organ prolapse, and she was was found to have Stage IV anterior, Stage IV posterior, Stage IV apical prolapse.   Urodynamics showed: 1. Sensation was normal; capacity was reduced 2. Stress Incontinence was demonstrated at normal pressures; 3. Detrusor Overactivity was not demonstrated with leakage. 4. Emptying was dysfunctional with a elevated PVR ( ), a small detrusor contraction was present,  abdominal straining present, dyssynergic urethral sphincter activity on EMG.  Past Medical History:  Diagnosis Date   Arthritis    Dysrhythmia    afib   GERD (gastroesophageal reflux disease)    Hypercholesterolemia    Hypertension    Lumbar degenerative disc disease    Miscarriage    times 3   Osteopenia    Seasonal allergies    Stroke (HCC)    Varicose vein    right leg   Vitamin D deficiency      Past Surgical History:  Procedure Laterality Date   DILATION AND CURETTAGE OF UTERUS  1998   hx of approx 26 years ago   FEMORAL HERNIA REPAIR  11/02/2006   Emergent reduction/primary repair.  Dr Grier Mitts   INGUINAL HERNIA REPAIR  02/03/2007   open w mesh.  Dr Grier Mitts   INGUINAL HERNIA REPAIR N/A 11/04/2021   Procedure: lysis of adhesions reduction of incarcerated recurrent left femoral hernia laparoscopic repair. laparoscopic right femoral hernia repair and right obturator repair with mesh. tap block;  Surgeon: Karie Soda, MD;  Location: [facility];  Service: General;  Laterality: N/A;   ROBOTIC ASSISTED DIAGNOSTIC LAPAROSCOPY  09/18/2021   robotic reduction/partial takedown of  recurrent inguinal hernia   TOTAL HIP ARTHROPLASTY Right 06/26/2014   Procedure: RIGHT TOTAL HIP ARTHROPLASTY ANTERIOR APPROACH WITH ACETABULAR AUTOGRAFT;  Surgeon: Loanne Drilling, MD;  Location: [facility];  Service: Orthopedics;  Laterality: Right;    is allergic to lisinopril.   Family History  Problem Relation Age of Onset   Cancer Mother    Stroke Father     Social History   Tobacco Use   Smoking status: Never   Smokeless tobacco: Never  Vaping Use   Vaping status: Never Used  Substance Use Topics   Alcohol use: No   Drug use: No     Review of Systems was negative for a full 10 system review except as noted in the History of Present Illness.   Current Outpatient Medications:    acetaminophen (TYLENOL) 500 MG tablet, Take 1 tablet (500 mg total) by mouth every 6 (six) hours as needed (pain)., Disp: 30 tablet, Rfl: 0   ibuprofen (ADVIL) 600 MG tablet, Take 1 tablet (600 mg total) by mouth every 6 (six) hours as needed., Disp: 30 tablet, Rfl: 0   polyethylene glycol powder (GLYCOLAX/MIRALAX) 17 GM/SCOOP powder, Take 17 g by mouth daily. Drink 17g (1 scoop) dissolved in water per day., Disp: 255 g, Rfl: 0   traMADol (ULTRAM) 50 MG tablet, Take 1 tablet (50 mg total) by mouth every 8 (eight) hours as needed for up to 5 days., Disp: 15 tablet, Rfl: 0   acetaminophen (TYLENOL) 500 MG tablet, Take 1,000 mg by mouth every 6 (six) hours  as needed for moderate pain., Disp: , Rfl:    aspirin EC 81 MG tablet, Take 81 mg by mouth daily. Swallow whole., Disp: , Rfl:    atorvastatin (LIPITOR) 20 MG tablet, Take 1 tablet by mouth once daily, Disp: 90 tablet, Rfl: 3   Cholecalciferol (VITAMIN D3) 50 MCG (2000 UT) TABS, Take 2,000 Units by mouth in the morning., Disp: , Rfl:    estradiol (ESTRACE) 0.1 MG/GM vaginal cream, PLACE 0.5 GRAMS VAGINALLY TWICE A WEEK, Disp: 43 g, Rfl: 0   hydrochlorothiazide (HYDRODIURIL) 25 MG tablet, Take 1 tablet (25 mg total) by mouth daily., Disp: 90 tablet, Rfl:  2   metoprolol succinate (TOPROL-XL) 100 MG 24 hr tablet, TAKE 1 & 1/2 (ONE & ONE-HALF) TABLETS BY MOUTH ONCE DAILY (TAKE  WITH  OR  IMMEDIATELY  FOLLOWING  A  MEAL), Disp: 135 tablet, Rfl: 3   omeprazole (PRILOSEC OTC) 20 MG tablet, Take 20 mg by mouth daily., Disp: , Rfl:    potassium chloride (KLOR-CON) 10 MEQ tablet, Take 1 tablet (10 mEq total) by mouth daily., Disp: 90 tablet, Rfl: 2   trospium (SANCTURA) 20 MG tablet, Take 1 tablet (20 mg total) by mouth 2 (two) times daily., Disp: 60 tablet, Rfl: 11   warfarin (COUMADIN) 5 MG tablet, TAKE 1 TO 1 & 1/2 (ONE TO ONE & ONE-HALF) TABLETS BY MOUTH ONCE DAILY, Disp: 45 tablet, Rfl: 0   Objective Vitals:   06/01/23 1346  BP: (!) 154/88  Pulse: 76    Gen: NAD CV: S1 S2 RRR Lungs: Clear to auscultation bilaterally Abd: soft, nontender   Previous Pelvic Exam showed: Normal external female genitalia; Bartholin's and Skene's glands normal in appearance; Urethral prolapse noted around catheter.    CST: negative   Complete procedentia noted Normal vaginal mucosa with atrophy, no abrasions present. Cervix normal appearance. Uterus normal single, nontender. Adnexa no mass, fullness, tenderness.       Pelvic floor strength I/V   Pelvic floor musculature: Right levator non-tender, Right obturator non-tender, Left levator non-tender, Left obturator non-tender   POP-Q:    POP-Q   3                                            Aa   9                                           Ba   9                                              C    9                                            Gh   3                                            Pb  9                                            tvl    3                                            Ap   9                                            Bp   -2.5                                              D      Assessment/ Plan  Assessment: The patient is a 77 y.o. year old scheduled to undergo  Exam under anesthesia, LeForte Colpocleisis, levator plication with perineorrhaphy, cystoscopy. Verbal consent was obtained for these procedures.  Plan: General Surgical Consent: The patient has previously been counseled on alternative treatments, and the decision by the patient and provider was to proceed with the procedure listed above.  For all procedures, there are risks of bleeding, infection, damage to surrounding organs including but not limited to bowel, bladder, blood vessels, ureters and nerves, and need for further surgery if an injury were to occur. These risks are all low with minimally invasive surgery.   There are risks of numbness and weakness at any body site or buttock/rectal pain.  It is possible that baseline pain can be worsened by surgery, either with or without mesh. If surgery is vaginal, there is also a low risk of possible conversion to laparoscopy or open abdominal incision where indicated. Very rare risks include blood transfusion, blood clot, heart attack, pneumonia, or death.   There is also a risk of short-term postoperative urinary retention with need to use a catheter. About half of patients need to go home from surgery with a catheter, which is then later removed in the office. The risk of long-term need for a catheter is very low. There is also a risk of worsening of overactive bladder.    Prolapse (with or without mesh): Risk factors for surgical failure  include things that put pressure on your pelvis and the surgical repair, including obesity, chronic cough, and heavy lifting or straining (including lifting children or adults, straining on the toilet, or lifting heavy objects such as furniture or anything weighing >25 lbs. Risks of recurrence is 20-30% with vaginal native tissue repair and a less than 10% with sacrocolpopexy with mesh.      We discussed consent for blood products. Risks for blood transfusion include allergic reactions, other reactions that can  affect different body organs and managed accordingly, transmission of infectious diseases such as HIV or Hepatitis. However, the blood is screened. Patient consents for blood products.  Pre-operative instructions:  She was instructed to not take Aspirin/NSAIDs x 7days prior to surgery. Antibiotic prophylaxis was ordered as indicated. She will do a bridge of lovenox and one dose will be given during peri-op.   Catheter use: Patient will go home with  foley if needed after post-operative voiding trial.  Post-operative instructions:  She was provided with specific post-operative instructions, including precautions and signs/symptoms for which we would recommend contacting us, in addition to daytime and after-hours contact phone numbers. This was provided on a handout.   Post-operative medications: Prescriptions for motrin, tylenol, miralax, and oxycodone were sent to her pharmacy. Discussed using ibuprofen and tylenol on a schedule to limit use of narcotics.   Laboratory testing:  We will check labs: As requested by anesthesia.   Preoperative clearance:  She does require surgical clearance.  Cardiac and neuro clearance in chart.   Post-operative follow-up:  A post-operative appointment will be made for 6 weeks from the date of surgery. If she needs a post-operative nurse visit for a voiding trial, that will be set up after she leaves the hospital.    Patient will call the clinic or use MyChart should anything change or any new issues arise.   Selmer Dominion, NP

## 2023-06-02 NOTE — Consult Note (Deleted)
[facility] H&P   Subjective Chief Complaint: Hannah Downs presents for a preoperative encounter.    History of Present Illness: Hannah Downs is a 77 y.o. female who presents for preoperative visit.  She is scheduled to undergo Exam under anesthesia, LeForte Colpocleisis, levator plication with perineorrhaphy, cystoscopy   on 06/20/23.  Her symptoms include Pelvic organ prolapse, and she was was found to have Stage IV anterior, Stage IV posterior, Stage IV apical prolapse.    Urodynamics showed: 1. Sensation was normal; capacity was reduced 2. Stress Incontinence was demonstrated at normal pressures; 3. Detrusor Overactivity was not demonstrated with leakage. 4. Emptying was dysfunctional with a elevated PVR ( ), a small detrusor contraction was present,  abdominal straining present, dyssynergic urethral sphincter activity on EMG.       Past Medical History:  Diagnosis Date   Arthritis     Dysrhythmia      afib   GERD (gastroesophageal reflux disease)     Hypercholesterolemia     Hypertension     Lumbar degenerative disc disease     Miscarriage      times 3   Osteopenia     Seasonal allergies     Stroke (HCC)     Varicose vein      right leg   Vitamin D deficiency                 Past Surgical History:  Procedure Laterality Date   DILATION AND CURETTAGE OF UTERUS   1998    hx of approx 26 years ago   FEMORAL HERNIA REPAIR   11/02/2006    Emergent reduction/primary repair.  Dr Grier Mitts   INGUINAL HERNIA REPAIR   02/03/2007    open w mesh.  Dr Grier Mitts   INGUINAL HERNIA REPAIR N/A 11/04/2021    Procedure: lysis of adhesions reduction of incarcerated recurrent left femoral hernia laparoscopic repair. laparoscopic right femoral hernia repair and right obturator repair with mesh. tap block;  Surgeon: Karie Soda, MD;  Location: [facility];  Service: General;  Laterality: N/A;   ROBOTIC ASSISTED DIAGNOSTIC LAPAROSCOPY   09/18/2021    robotic  reduction/partial takedown of recurrent inguinal hernia   TOTAL HIP ARTHROPLASTY Right 06/26/2014    Procedure: RIGHT TOTAL HIP ARTHROPLASTY ANTERIOR APPROACH WITH ACETABULAR AUTOGRAFT;  Surgeon: Loanne Drilling, MD;  Location: [facility];  Service: Orthopedics;  Laterality: Right;          is allergic to lisinopril.         Family History  Problem Relation Age of Onset   Cancer Mother     Stroke Father            Social History  Social History        Tobacco Use   Smoking status: Never   Smokeless tobacco: Never  Vaping Use   Vaping status: Never Used  Substance Use Topics   Alcohol use: No   Drug use: No          Review of Systems was negative for a full 10 system review except as noted in the History of Present Illness.    Current Medications    Current Outpatient Medications:    acetaminophen (TYLENOL) 500 MG tablet, Take 1 tablet (500 mg total) by mouth every 6 (six) hours as needed (pain)., Disp: 30 tablet, Rfl: 0   ibuprofen (ADVIL) 600 MG tablet, Take 1 tablet (600 mg total) by mouth every 6 (six) hours as needed.,  Disp: 30 tablet, Rfl: 0   polyethylene glycol powder (GLYCOLAX/MIRALAX) 17 GM/SCOOP powder, Take 17 g by mouth daily. Drink 17g (1 scoop) dissolved in water per day., Disp: 255 g, Rfl: 0   traMADol (ULTRAM) 50 MG tablet, Take 1 tablet (50 mg total) by mouth every 8 (eight) hours as needed for up to 5 days., Disp: 15 tablet, Rfl: 0   acetaminophen (TYLENOL) 500 MG tablet, Take 1,000 mg by mouth every 6 (six) hours as needed for moderate pain., Disp: , Rfl:    aspirin EC 81 MG tablet, Take 81 mg by mouth daily. Swallow whole., Disp: , Rfl:    atorvastatin (LIPITOR) 20 MG tablet, Take 1 tablet by mouth once daily, Disp: 90 tablet, Rfl: 3   Cholecalciferol (VITAMIN D3) 50 MCG (2000 UT) TABS, Take 2,000 Units by mouth in the morning., Disp: , Rfl:    estradiol (ESTRACE) 0.1 MG/GM vaginal cream, PLACE 0.5 GRAMS VAGINALLY TWICE A WEEK, Disp: 43 g, Rfl: 0    hydrochlorothiazide (HYDRODIURIL) 25 MG tablet, Take 1 tablet (25 mg total) by mouth daily., Disp: 90 tablet, Rfl: 2   metoprolol succinate (TOPROL-XL) 100 MG 24 hr tablet, TAKE 1 & 1/2 (ONE & ONE-HALF) TABLETS BY MOUTH ONCE DAILY (TAKE  WITH  OR  IMMEDIATELY  FOLLOWING  A  MEAL), Disp: 135 tablet, Rfl: 3   omeprazole (PRILOSEC OTC) 20 MG tablet, Take 20 mg by mouth daily., Disp: , Rfl:    potassium chloride (KLOR-CON) 10 MEQ tablet, Take 1 tablet (10 mEq total) by mouth daily., Disp: 90 tablet, Rfl: 2   trospium (SANCTURA) 20 MG tablet, Take 1 tablet (20 mg total) by mouth 2 (two) times daily., Disp: 60 tablet, Rfl: 11   warfarin (COUMADIN) 5 MG tablet, TAKE 1 TO 1 & 1/2 (ONE TO ONE & ONE-HALF) TABLETS BY MOUTH ONCE DAILY, Disp: 45 tablet, Rfl: 0      Objective    Vitals:    06/01/23 1346  BP: (!) 154/88  Pulse: 76      Gen: NAD CV: S1 S2 RRR Lungs: Clear to auscultation bilaterally Abd: soft, nontender     Previous Pelvic Exam showed: Normal external female genitalia; Bartholin's and Skene's glands normal in appearance; Urethral prolapse noted around catheter.    CST: negative   Complete procedentia noted Normal vaginal mucosa with atrophy, no abrasions present. Cervix normal appearance. Uterus normal single, nontender. Adnexa no mass, fullness, tenderness.       Pelvic floor strength I/V   Pelvic floor musculature: Right levator non-tender, Right obturator non-tender, Left levator non-tender, Left obturator non-tender   POP-Q:    POP-Q   3                                            Aa   9                                           Ba   9                                              C    9  Gh   3                                            Pb   9                                            tvl    3                                            Ap   9                                            Bp   -2.5                                               D          Assessment/ Plan   Assessment: The patient is a 77 y.o. year old scheduled to undergo Exam under anesthesia, LeForte Colpocleisis, levator plication with perineorrhaphy, cystoscopy. Verbal consent was obtained for these procedures.

## 2023-06-02 NOTE — H&P (Signed)
[facility] H&P  Subjective Chief Complaint: Hannah Downs presents for a preoperative encounter.   History of Present Illness: Hannah Downs is a 77 y.o. female who presents for preoperative visit.  She is scheduled to undergo Exam under anesthesia, LeForte Colpocleisis, levator plication with perineorrhaphy, cystoscopy   on 06/20/23.  Her symptoms include Pelvic organ prolapse, and she was was found to have Stage IV anterior, Stage IV posterior, Stage IV apical prolapse.   Urodynamics showed: 1. Sensation was normal; capacity was reduced 2. Stress Incontinence was demonstrated at normal pressures; 3. Detrusor Overactivity was not demonstrated with leakage. 4. Emptying was dysfunctional with a elevated PVR ( ), a small detrusor contraction was present,  abdominal straining present, dyssynergic urethral sphincter activity on EMG.  Past Medical History:  Diagnosis Date   Arthritis    Dysrhythmia    afib   GERD (gastroesophageal reflux disease)    Hypercholesterolemia    Hypertension    Lumbar degenerative disc disease    Miscarriage    times 3   Osteopenia    Seasonal allergies    Stroke (HCC)    Varicose vein    right leg   Vitamin D deficiency      Past Surgical History:  Procedure Laterality Date   DILATION AND CURETTAGE OF UTERUS  1998   hx of approx 26 years ago   FEMORAL HERNIA REPAIR  11/02/2006   Emergent reduction/primary repair.  Dr Grier Mitts   INGUINAL HERNIA REPAIR  02/03/2007   open w mesh.  Dr Grier Mitts   INGUINAL HERNIA REPAIR N/A 11/04/2021   Procedure: lysis of adhesions reduction of incarcerated recurrent left femoral hernia laparoscopic repair. laparoscopic right femoral hernia repair and right obturator repair with mesh. tap block;  Surgeon: Karie Soda, MD;  Location: [facility];  Service: General;  Laterality: N/A;   ROBOTIC ASSISTED DIAGNOSTIC LAPAROSCOPY  09/18/2021   robotic reduction/partial takedown of recurrent inguinal  hernia   TOTAL HIP ARTHROPLASTY Right 06/26/2014   Procedure: RIGHT TOTAL HIP ARTHROPLASTY ANTERIOR APPROACH WITH ACETABULAR AUTOGRAFT;  Surgeon: Loanne Drilling, MD;  Location: [facility];  Service: Orthopedics;  Laterality: Right;    is allergic to lisinopril.   Family History  Problem Relation Age of Onset   Cancer Mother    Stroke Father     Social History   Tobacco Use   Smoking status: Never   Smokeless tobacco: Never  Vaping Use   Vaping status: Never Used  Substance Use Topics   Alcohol use: No   Drug use: No     Review of Systems was negative for a full 10 system review except as noted in the History of Present Illness.  No current facility-administered medications for this encounter.  Current Outpatient Medications:    acetaminophen (TYLENOL) 500 MG tablet, Take 1,000 mg by mouth every 6 (six) hours as needed for moderate pain., Disp: , Rfl:    acetaminophen (TYLENOL) 500 MG tablet, Take 1 tablet (500 mg total) by mouth every 6 (six) hours as needed (pain)., Disp: 30 tablet, Rfl: 0   aspirin EC 81 MG tablet, Take 81 mg by mouth daily. Swallow whole., Disp: , Rfl:    atorvastatin (LIPITOR) 20 MG tablet, Take 1 tablet by mouth once daily, Disp: 90 tablet, Rfl: 3   Cholecalciferol (VITAMIN D3) 50 MCG (2000 UT) TABS, Take 2,000 Units by mouth in the morning., Disp: , Rfl:    estradiol (ESTRACE) 0.1 MG/GM vaginal cream, PLACE 0.5 GRAMS  VAGINALLY TWICE A WEEK, Disp: 43 g, Rfl: 0   hydrochlorothiazide (HYDRODIURIL) 25 MG tablet, Take 1 tablet (25 mg total) by mouth daily., Disp: 90 tablet, Rfl: 2   ibuprofen (ADVIL) 600 MG tablet, Take 1 tablet (600 mg total) by mouth every 6 (six) hours as needed., Disp: 30 tablet, Rfl: 0   metoprolol succinate (TOPROL-XL) 100 MG 24 hr tablet, TAKE 1 & 1/2 (ONE & ONE-HALF) TABLETS BY MOUTH ONCE DAILY (TAKE  WITH  OR  IMMEDIATELY  FOLLOWING  A  MEAL), Disp: 135 tablet, Rfl: 3   omeprazole (PRILOSEC OTC) 20 MG tablet, Take 20 mg by mouth daily.,  Disp: , Rfl:    polyethylene glycol powder (GLYCOLAX/MIRALAX) 17 GM/SCOOP powder, Take 17 g by mouth daily. Drink 17g (1 scoop) dissolved in water per day., Disp: 255 g, Rfl: 0   potassium chloride (KLOR-CON) 10 MEQ tablet, Take 1 tablet (10 mEq total) by mouth daily., Disp: 90 tablet, Rfl: 2   traMADol (ULTRAM) 50 MG tablet, Take 1 tablet (50 mg total) by mouth every 8 (eight) hours as needed for up to 5 days., Disp: 15 tablet, Rfl: 0   trospium (SANCTURA) 20 MG tablet, Take 1 tablet (20 mg total) by mouth 2 (two) times daily., Disp: 60 tablet, Rfl: 11   warfarin (COUMADIN) 5 MG tablet, TAKE 1 TO 1 & 1/2 (ONE TO ONE & ONE-HALF) TABLETS BY MOUTH ONCE DAILY, Disp: 45 tablet, Rfl: 0   Objective There were no vitals filed for this visit.   Gen: NAD CV: S1 S2 RRR Lungs: Clear to auscultation bilaterally Abd: soft, nontender   Previous Pelvic Exam showed: Normal external female genitalia; Bartholin's and Skene's glands normal in appearance; Urethral prolapse noted around catheter.    CST: negative   Complete procedentia noted Normal vaginal mucosa with atrophy, no abrasions present. Cervix normal appearance. Uterus normal single, nontender. Adnexa no mass, fullness, tenderness.       Pelvic floor strength I/V   Pelvic floor musculature: Right levator non-tender, Right obturator non-tender, Left levator non-tender, Left obturator non-tender   POP-Q:    POP-Q   3                                            Aa   9                                           Ba   9                                              C    9                                            Gh   3                                            Pb  9                                            tvl    3                                            Ap   9                                            Bp   -2.5                                              D      Assessment/ Plan  Assessment: The patient is a 77  y.o. year old scheduled to undergo Exam under anesthesia, LeForte Colpocleisis, levator plication with perineorrhaphy, cystoscopy. Verbal consent was obtained for these procedures.

## 2023-06-05 ENCOUNTER — Other Ambulatory Visit: Payer: Self-pay | Admitting: Cardiovascular Disease

## 2023-06-05 DIAGNOSIS — I480 Paroxysmal atrial fibrillation: Secondary | ICD-10-CM

## 2023-06-09 ENCOUNTER — Encounter (HOSPITAL_BASED_OUTPATIENT_CLINIC_OR_DEPARTMENT_OTHER): Payer: Self-pay | Admitting: Obstetrics and Gynecology

## 2023-06-10 ENCOUNTER — Other Ambulatory Visit (HOSPITAL_COMMUNITY): Payer: Self-pay

## 2023-06-10 ENCOUNTER — Ambulatory Visit: Payer: Medicare Other | Attending: Internal Medicine | Admitting: *Deleted

## 2023-06-10 DIAGNOSIS — I4891 Unspecified atrial fibrillation: Secondary | ICD-10-CM | POA: Diagnosis not present

## 2023-06-10 DIAGNOSIS — Z5181 Encounter for therapeutic drug level monitoring: Secondary | ICD-10-CM

## 2023-06-10 LAB — POCT INR: POC INR: 2.9

## 2023-06-10 MED ORDER — ENOXAPARIN SODIUM 60 MG/0.6ML IJ SOSY: 60.0000 mg | PREFILLED_SYRINGE | Freq: Two times a day (BID) | INTRAMUSCULAR | 0 refills | Status: DC

## 2023-06-10 MED ORDER — ENOXAPARIN SODIUM 60 MG/0.6ML IJ SOSY
60.0000 mg | PREFILLED_SYRINGE | Freq: Two times a day (BID) | INTRAMUSCULAR | 0 refills | Status: DC
  Filled 2023-06-10: qty 12, 10d supply, fill #0

## 2023-06-10 NOTE — Patient Instructions (Addendum)
Description   Continue  taking warfarin 1 tablet daily except for 1.5 tablets on Tuesdays. Please follow bridge instructions. Recheck INR on 10/14. [phone]         10/1: Last dose of warfarin.  10/2: No warfarin or enoxaparin (Lovenox).  10/3: Inject enoxaparin 60mg  in the fatty abdominal tissue at least 2 inches from the belly button twice a day about 12 hours apart, 8am and 8pm rotate sites. No warfarin.  10/4: Inject enoxaparin in the fatty tissue every 12 hours, 8am and 8pm. No warfarin.  10/5: Inject enoxaparin in the fatty tissue every 12 hours, 8am and 8pm. No warfarin.  10/6: Inject enoxaparin in the fatty tissue in the morning at 8 am (No PM dose). No warfarin.  10/7: Procedure Day - No enoxaparin - Resume warfarin in the evening or as directed by doctor.   10/8: Resume enoxaparin inject in the fatty tissue every 12 hours and take warfarin  10/9: Inject enoxaparin in the fatty tissue every 12 hours and take warfarin  10/10: Inject enoxaparin in the fatty tissue every 12 hours and take warfarin  10/11: Inject enoxaparin in the fatty tissue every 12 hours and take warfarin  10/12: Inject enoxaparin in the fatty tissue every 12 hours and take warfarin  10/13:Inject enoxaparin in the fatty tissue every 12 hours and take warfarin  10/14: warfarin appt to check INR.

## 2023-06-10 NOTE — Progress Notes (Signed)
10/1: Last dose of warfarin.  10/2: No warfarin or enoxaparin (Lovenox).  10/3: Inject enoxaparin 60mg  in the fatty abdominal tissue at least 2 inches from the belly button twice a day about 12 hours apart, 8am and 8pm rotate sites. No warfarin.  10/4: Inject enoxaparin in the fatty tissue every 12 hours, 8am and 8pm. No warfarin.  10/5: Inject enoxaparin in the fatty tissue every 12 hours, 8am and 8pm. No warfarin.  10/6: Inject enoxaparin in the fatty tissue in the morning at 8 am (No PM dose). No warfarin.  10/7: Procedure Day - No enoxaparin - Resume warfarin in the evening or as directed by doctor.   10/8: Resume enoxaparin inject in the fatty tissue every 12 hours and take warfarin  10/9: Inject enoxaparin in the fatty tissue every 12 hours and take warfarin  10/10: Inject enoxaparin in the fatty tissue every 12 hours and take warfarin  10/11: Inject enoxaparin in the fatty tissue every 12 hours and take warfarin  10/12: Inject enoxaparin in the fatty tissue every 12 hours and take warfarin  10/13: Inject enoxaparin in the fatty tissue every 12 hours and take warfarin  10/14: warfarin appt to check INR.

## 2023-06-13 ENCOUNTER — Encounter (HOSPITAL_BASED_OUTPATIENT_CLINIC_OR_DEPARTMENT_OTHER): Payer: Self-pay | Admitting: Obstetrics and Gynecology

## 2023-06-13 NOTE — Progress Notes (Signed)
Spoke w/ via phone for pre-op interview--- pt Lab needs dos----  [place] results------ current EKG in epic  COVID test -----patient states asymptomatic no test needed Arrive at ------- 1100 on 06-20-2023 NPO after MN NO Solid Food.  Clear liquids from MN until--- 1000 Med rec completed Medications to take morning of surgery ----- toprol , prilosec Diabetic medication ----- n/a Patient instructed no nail polish to be worn day of surgery Patient instructed to bring photo id and insurance card day of surgery Patient aware to have Driver (ride ) / caregiver    for 24 hours after surgery - husband, henry Patient Special Instructions ----- per pt received coumadin/ lovenox injeciton instructions from coumadin clinic , copy in epic/ chart dated 06-10-2023 Pre-Op special Instructions -----  pt has cardiac clearance by Jari Favre PA 10-07-2022 in epic/ chart and lov in epic 01-05-2023;  pt has neurology office visit clearance by dr Pearlean Brownie 09-21-2022 in epic/ chart. Patient verbalized understanding of instructions that were given at this phone interview. Patient denies chest pain, sob, fever, cough at the interview.    Anesthesia Review:  HTN;  PAF on coumadin;  hx CVA x2 2023 in setting off coumadin due to emergent surgery, pt no residuals  (last INR in epic 06-10-2023, 2.9) Pt denies stroke s&s, but does have sob w/ exertion.  PCP: Dr C. White Cardiologist : Dr Melburn Popper [facility] 01-05-2023 Neurologist:  Dr Pearlean Brownie Theron Arista 09-21-2022) Chest x-ray : 11-10-2021 EKG : 10-05-2022 Echo : 11-08-2021 Stress test: no Cardiac Cath : no Activity level: see above Sleep Study/ CPAP :no Blood Thinner/ Instructions /Last Dose: Coumadin ASA / Instructions/ Last Dose : ASA 81mg ,  pt stated not taking morning of surgery  Pt has instructions from coumadin clinic to bridge w/ lovenox prior to surgery in epic dated 06-10-2023, copy in chart.

## 2023-06-20 ENCOUNTER — Inpatient Hospital Stay (HOSPITAL_BASED_OUTPATIENT_CLINIC_OR_DEPARTMENT_OTHER)
Admission: AD | Admit: 2023-06-20 | Discharge: 2023-06-24 | DRG: 981 | Disposition: A | Discharge status: Home / Self Care | Payer: Medicare Other | Attending: Internal Medicine | Admitting: Internal Medicine

## 2023-06-20 ENCOUNTER — Encounter (HOSPITAL_COMMUNITY)
Admission: AD | Disposition: A | Discharge status: Home / Self Care | Payer: Self-pay | Source: Home / Self Care | Attending: Internal Medicine

## 2023-06-20 ENCOUNTER — Telehealth: Payer: Self-pay | Admitting: Obstetrics and Gynecology

## 2023-06-20 ENCOUNTER — Encounter (HOSPITAL_BASED_OUTPATIENT_CLINIC_OR_DEPARTMENT_OTHER): Payer: Self-pay | Admitting: Obstetrics and Gynecology

## 2023-06-20 ENCOUNTER — Other Ambulatory Visit: Payer: Self-pay

## 2023-06-20 ENCOUNTER — Ambulatory Visit (HOSPITAL_BASED_OUTPATIENT_CLINIC_OR_DEPARTMENT_OTHER): Payer: Medicare Other | Admitting: Anesthesiology

## 2023-06-20 DIAGNOSIS — N814 Uterovaginal prolapse, unspecified: Secondary | ICD-10-CM

## 2023-06-20 DIAGNOSIS — N819 Female genital prolapse, unspecified: Secondary | ICD-10-CM

## 2023-06-20 DIAGNOSIS — E876 Hypokalemia: Secondary | ICD-10-CM | POA: Diagnosis present

## 2023-06-20 DIAGNOSIS — I700 Atherosclerosis of aorta: Secondary | ICD-10-CM | POA: Diagnosis present

## 2023-06-20 DIAGNOSIS — E559 Vitamin D deficiency, unspecified: Secondary | ICD-10-CM | POA: Diagnosis present

## 2023-06-20 DIAGNOSIS — I10 Essential (primary) hypertension: Secondary | ICD-10-CM | POA: Diagnosis present

## 2023-06-20 DIAGNOSIS — Z79899 Other long term (current) drug therapy: Secondary | ICD-10-CM

## 2023-06-20 DIAGNOSIS — I4892 Unspecified atrial flutter: Secondary | ICD-10-CM | POA: Insufficient documentation

## 2023-06-20 DIAGNOSIS — M8580 Other specified disorders of bone density and structure, unspecified site: Secondary | ICD-10-CM | POA: Diagnosis present

## 2023-06-20 DIAGNOSIS — Z823 Family history of stroke: Secondary | ICD-10-CM

## 2023-06-20 DIAGNOSIS — I5033 Acute on chronic diastolic (congestive) heart failure: Secondary | ICD-10-CM | POA: Diagnosis not present

## 2023-06-20 DIAGNOSIS — N368 Other specified disorders of urethra: Secondary | ICD-10-CM | POA: Diagnosis present

## 2023-06-20 DIAGNOSIS — Z7982 Long term (current) use of aspirin: Secondary | ICD-10-CM | POA: Diagnosis not present

## 2023-06-20 DIAGNOSIS — I110 Hypertensive heart disease with heart failure: Secondary | ICD-10-CM | POA: Diagnosis present

## 2023-06-20 DIAGNOSIS — I5031 Acute diastolic (congestive) heart failure: Secondary | ICD-10-CM | POA: Diagnosis not present

## 2023-06-20 DIAGNOSIS — E782 Mixed hyperlipidemia: Secondary | ICD-10-CM | POA: Diagnosis not present

## 2023-06-20 DIAGNOSIS — I9581 Postprocedural hypotension: Secondary | ICD-10-CM | POA: Diagnosis present

## 2023-06-20 DIAGNOSIS — I97191 Other postprocedural cardiac functional disturbances following other surgery: Secondary | ICD-10-CM | POA: Diagnosis present

## 2023-06-20 DIAGNOSIS — I480 Paroxysmal atrial fibrillation: Secondary | ICD-10-CM | POA: Diagnosis present

## 2023-06-20 DIAGNOSIS — Z888 Allergy status to other drugs, medicaments and biological substances status: Secondary | ICD-10-CM | POA: Diagnosis not present

## 2023-06-20 DIAGNOSIS — Z7901 Long term (current) use of anticoagulants: Secondary | ICD-10-CM | POA: Diagnosis not present

## 2023-06-20 DIAGNOSIS — N813 Complete uterovaginal prolapse: Secondary | ICD-10-CM | POA: Diagnosis present

## 2023-06-20 DIAGNOSIS — Y838 Other surgical procedures as the cause of abnormal reaction of the patient, or of later complication, without mention of misadventure at the time of the procedure: Secondary | ICD-10-CM | POA: Diagnosis present

## 2023-06-20 DIAGNOSIS — I4891 Unspecified atrial fibrillation: Secondary | ICD-10-CM | POA: Diagnosis not present

## 2023-06-20 DIAGNOSIS — I959 Hypotension, unspecified: Secondary | ICD-10-CM | POA: Insufficient documentation

## 2023-06-20 DIAGNOSIS — Z8673 Personal history of transient ischemic attack (TIA), and cerebral infarction without residual deficits: Secondary | ICD-10-CM

## 2023-06-20 DIAGNOSIS — Z96641 Presence of right artificial hip joint: Secondary | ICD-10-CM | POA: Diagnosis present

## 2023-06-20 DIAGNOSIS — Z01818 Encounter for other preprocedural examination: Principal | ICD-10-CM

## 2023-06-20 DIAGNOSIS — Z8249 Family history of ischemic heart disease and other diseases of the circulatory system: Secondary | ICD-10-CM

## 2023-06-20 DIAGNOSIS — I5032 Chronic diastolic (congestive) heart failure: Secondary | ICD-10-CM | POA: Diagnosis present

## 2023-06-20 DIAGNOSIS — E7800 Pure hypercholesterolemia, unspecified: Secondary | ICD-10-CM | POA: Diagnosis not present

## 2023-06-20 DIAGNOSIS — K219 Gastro-esophageal reflux disease without esophagitis: Secondary | ICD-10-CM | POA: Diagnosis not present

## 2023-06-20 DIAGNOSIS — I081 Rheumatic disorders of both mitral and tricuspid valves: Secondary | ICD-10-CM | POA: Diagnosis present

## 2023-06-20 DIAGNOSIS — E785 Hyperlipidemia, unspecified: Secondary | ICD-10-CM | POA: Diagnosis present

## 2023-06-20 DIAGNOSIS — J90 Pleural effusion, not elsewhere classified: Secondary | ICD-10-CM | POA: Diagnosis not present

## 2023-06-20 DIAGNOSIS — I5030 Unspecified diastolic (congestive) heart failure: Secondary | ICD-10-CM | POA: Diagnosis present

## 2023-06-20 DIAGNOSIS — E871 Hypo-osmolality and hyponatremia: Secondary | ICD-10-CM | POA: Diagnosis not present

## 2023-06-20 DIAGNOSIS — E861 Hypovolemia: Secondary | ICD-10-CM | POA: Diagnosis present

## 2023-06-20 DIAGNOSIS — N393 Stress incontinence (female) (male): Secondary | ICD-10-CM | POA: Diagnosis present

## 2023-06-20 HISTORY — DX: Long term (current) use of anticoagulants: Z79.01

## 2023-06-20 HISTORY — PX: CYSTOSCOPY: SHX5120

## 2023-06-20 HISTORY — PX: COLPOCLEISIS: SHX6814

## 2023-06-20 HISTORY — DX: Female genital prolapse, unspecified: N81.9

## 2023-06-20 HISTORY — DX: Presence of urogenital implants: Z96.0

## 2023-06-20 HISTORY — DX: Dyspnea, unspecified: R06.00

## 2023-06-20 HISTORY — DX: Hyperlipidemia, unspecified: E78.5

## 2023-06-20 HISTORY — DX: Presence of spectacles and contact lenses: Z97.3

## 2023-06-20 LAB — POCT I-STAT, CHEM 8
BUN: 14 mg/dL (ref 8–23)
Calcium, Ion: 1.17 mmol/L (ref 1.15–1.40)
Chloride: 93 mmol/L — ABNORMAL LOW (ref 98–111)
Creatinine, Ser: 0.6 mg/dL (ref 0.44–1.00)
Glucose, Bld: 143 mg/dL — ABNORMAL HIGH (ref 70–99)
HCT: 41 % (ref 36.0–46.0)
Hemoglobin: 13.9 g/dL (ref 12.0–15.0)
Potassium: 3.8 mmol/L (ref 3.5–5.1)
Sodium: 131 mmol/L — ABNORMAL LOW (ref 135–145)
TCO2: 25 mmol/L (ref 22–32)

## 2023-06-20 LAB — HEMOGLOBIN AND HEMATOCRIT, BLOOD
HCT: 33.3 % — ABNORMAL LOW (ref 36.0–46.0)
Hemoglobin: 11.2 g/dL — ABNORMAL LOW (ref 12.0–15.0)

## 2023-06-20 LAB — TROPONIN I (HIGH SENSITIVITY): Troponin I (High Sensitivity): 8 ng/L (ref <18)

## 2023-06-20 SURGERY — COLPOCLEISIS
Anesthesia: General | Site: Vagina

## 2023-06-20 MED ORDER — HYDROCHLOROTHIAZIDE 25 MG PO TABS: 25.0000 mg | ORAL_TABLET | Freq: Every day | ORAL | Status: DC

## 2023-06-20 MED ORDER — WARFARIN SODIUM 5 MG PO TABS: 5.0000 mg | ORAL_TABLET | Freq: Every day | ORAL | Status: DC

## 2023-06-20 MED ORDER — ESMOLOL HCL 100 MG/10ML IV SOLN
INTRAVENOUS | Status: DC | PRN
Start: 2023-06-20 — End: 2023-06-20
  Administered 2023-06-20: 20 mg via INTRAVENOUS
  Administered 2023-06-20 (×2): 10 mg via INTRAVENOUS

## 2023-06-20 MED ORDER — ALBUMIN HUMAN 5 % IV SOLN
INTRAVENOUS | Status: AC
  Filled 2023-06-20: qty 250

## 2023-06-20 MED ORDER — CEFAZOLIN SODIUM-DEXTROSE 2-4 GM/100ML-% IV SOLN
2.0000 g | INTRAVENOUS | Status: AC
  Administered 2023-06-20: 2 g via INTRAVENOUS

## 2023-06-20 MED ORDER — DEXAMETHASONE SODIUM PHOSPHATE 4 MG/ML IJ SOLN
INTRAMUSCULAR | Status: DC | PRN
  Administered 2023-06-20: 4 mg via INTRAVENOUS

## 2023-06-20 MED ORDER — ONDANSETRON HCL 4 MG/2ML IJ SOLN
INTRAMUSCULAR | Status: DC | PRN
  Administered 2023-06-20: 4 mg via INTRAVENOUS

## 2023-06-20 MED ORDER — LIDOCAINE HCL (CARDIAC) PF 50 MG/5ML IV SOSY
PREFILLED_SYRINGE | INTRAVENOUS | Status: DC | PRN
  Administered 2023-06-20: 40 mg via INTRAVENOUS

## 2023-06-20 MED ORDER — PHENYLEPHRINE HCL (PRESSORS) 10 MG/ML IV SOLN
INTRAVENOUS | Status: AC
  Filled 2023-06-20: qty 1

## 2023-06-20 MED ORDER — DROPERIDOL 2.5 MG/ML IJ SOLN: 0.6250 mg | Freq: Once | INTRAMUSCULAR | Status: DC | PRN

## 2023-06-20 MED ORDER — ACETAMINOPHEN 500 MG PO TABS
ORAL_TABLET | ORAL | Status: AC
  Filled 2023-06-20: qty 2

## 2023-06-20 MED ORDER — ENOXAPARIN SODIUM 40 MG/0.4ML IJ SOSY
PREFILLED_SYRINGE | INTRAMUSCULAR | Status: AC
  Filled 2023-06-20: qty 0.4

## 2023-06-20 MED ORDER — GABAPENTIN 300 MG PO CAPS
ORAL_CAPSULE | ORAL | Status: AC
  Filled 2023-06-20: qty 1

## 2023-06-20 MED ORDER — LIDOCAINE-EPINEPHRINE 1 %-1:100000 IJ SOLN
INTRAMUSCULAR | Status: DC | PRN
  Administered 2023-06-20: 20 mL
  Administered 2023-06-20: 7.5 mL

## 2023-06-20 MED ORDER — SIMETHICONE 80 MG PO CHEW: 80.0000 mg | CHEWABLE_TABLET | Freq: Four times a day (QID) | ORAL | Status: DC | PRN

## 2023-06-20 MED ORDER — ONDANSETRON HCL 4 MG/2ML IJ SOLN: 4.0000 mg | Freq: Four times a day (QID) | INTRAMUSCULAR | Status: DC | PRN

## 2023-06-20 MED ORDER — 0.9 % SODIUM CHLORIDE (POUR BTL) OPTIME
TOPICAL | Status: DC | PRN
  Administered 2023-06-20: 500 mL

## 2023-06-20 MED ORDER — VASOPRESSIN 20 UNIT/ML IV SOLN
INTRAVENOUS | Status: DC | PRN
Start: 2023-06-20 — End: 2023-06-20
  Administered 2023-06-20 (×3): 2 [IU] via INTRAVENOUS

## 2023-06-20 MED ORDER — LACTATED RINGERS IV BOLUS
1000.0000 mL | Freq: Once | INTRAVENOUS | Status: AC
  Administered 2023-06-20: 1000 mL via INTRAVENOUS

## 2023-06-20 MED ORDER — SODIUM CHLORIDE 0.9 % IR SOLN
Status: DC | PRN
  Administered 2023-06-20: 250 mL

## 2023-06-20 MED ORDER — SODIUM CHLORIDE (PF) 0.9 % IJ SOLN
INTRAMUSCULAR | Status: DC | PRN
Start: 2023-06-20 — End: 2023-06-20
  Administered 2023-06-20: 7.5 mL

## 2023-06-20 MED ORDER — LIDOCAINE HCL (PF) 2 % IJ SOLN
INTRAMUSCULAR | Status: AC
  Filled 2023-06-20: qty 5

## 2023-06-20 MED ORDER — OXYCODONE HCL 5 MG PO TABS: 5.0000 mg | ORAL_TABLET | Freq: Once | ORAL | Status: DC | PRN

## 2023-06-20 MED ORDER — HEPARIN (PORCINE) 25000 UT/250ML-% IV SOLN
950.0000 [IU]/h | INTRAVENOUS | Status: DC
  Administered 2023-06-20: 800 [IU]/h via INTRAVENOUS
  Administered 2023-06-22: 900 [IU]/h via INTRAVENOUS
  Administered 2023-06-23 – 2023-06-24 (×2): 950 [IU]/h via INTRAVENOUS
  Filled 2023-06-20 (×4): qty 250

## 2023-06-20 MED ORDER — ATORVASTATIN CALCIUM 20 MG PO TABS
20.0000 mg | ORAL_TABLET | Freq: Every day | ORAL | Status: DC
  Administered 2023-06-21 – 2023-06-24 (×4): 20 mg via ORAL
  Filled 2023-06-20 (×4): qty 1

## 2023-06-20 MED ORDER — DEXMEDETOMIDINE HCL IN NACL 80 MCG/20ML IV SOLN
INTRAVENOUS | Status: DC | PRN
Start: 2023-06-20 — End: 2023-06-20
  Administered 2023-06-20: 8 ug via INTRAVENOUS

## 2023-06-20 MED ORDER — METOPROLOL TARTRATE 5 MG/5ML IV SOLN
INTRAVENOUS | Status: AC
  Filled 2023-06-20: qty 5

## 2023-06-20 MED ORDER — PHENYLEPHRINE HCL-NACL 20-0.9 MG/250ML-% IV SOLN
INTRAVENOUS | Status: DC | PRN
Start: 2023-06-20 — End: 2023-06-20
  Administered 2023-06-20: 50 ug/min via INTRAVENOUS

## 2023-06-20 MED ORDER — ACETAMINOPHEN 325 MG PO TABS
650.0000 mg | ORAL_TABLET | ORAL | Status: DC | PRN
  Administered 2023-06-20 – 2023-06-23 (×5): 650 mg via ORAL
  Filled 2023-06-20 (×5): qty 2

## 2023-06-20 MED ORDER — PROPOFOL 10 MG/ML IV BOLUS
INTRAVENOUS | Status: AC
  Filled 2023-06-20: qty 20

## 2023-06-20 MED ORDER — METOPROLOL TARTRATE 5 MG/5ML IV SOLN
2.0000 mg | Freq: Once | INTRAVENOUS | Status: AC
  Administered 2023-06-20: 2 mg via INTRAVENOUS

## 2023-06-20 MED ORDER — METOPROLOL TARTRATE 5 MG/5ML IV SOLN
3.0000 mg | Freq: Once | INTRAVENOUS | Status: AC
  Administered 2023-06-20: 3 mg via INTRAVENOUS

## 2023-06-20 MED ORDER — ACETAMINOPHEN 500 MG PO TABS
1000.0000 mg | ORAL_TABLET | ORAL | Status: AC
  Administered 2023-06-20: 1000 mg via ORAL

## 2023-06-20 MED ORDER — LACTATED RINGERS IV SOLN: INTRAVENOUS | Status: DC

## 2023-06-20 MED ORDER — ALBUMIN HUMAN 5 % IV SOLN
12.5000 g | Freq: Once | INTRAVENOUS | Status: AC
  Administered 2023-06-20: 12.5 g via INTRAVENOUS

## 2023-06-20 MED ORDER — FENTANYL CITRATE (PF) 100 MCG/2ML IJ SOLN
INTRAMUSCULAR | Status: AC
  Filled 2023-06-20: qty 2

## 2023-06-20 MED ORDER — OMEPRAZOLE 20 MG PO CPDR
20.0000 mg | DELAYED_RELEASE_CAPSULE | Freq: Every day | ORAL | Status: DC
  Administered 2023-06-21 – 2023-06-24 (×4): 20 mg via ORAL
  Filled 2023-06-20 (×4): qty 1

## 2023-06-20 MED ORDER — OXYCODONE HCL 5 MG/5ML PO SOLN: 5.0000 mg | Freq: Once | ORAL | Status: DC | PRN

## 2023-06-20 MED ORDER — ONDANSETRON HCL 4 MG/2ML IJ SOLN
INTRAMUSCULAR | Status: AC
  Filled 2023-06-20: qty 2

## 2023-06-20 MED ORDER — PHENAZOPYRIDINE HCL 100 MG PO TABS
200.0000 mg | ORAL_TABLET | ORAL | Status: AC
  Administered 2023-06-20: 200 mg via ORAL

## 2023-06-20 MED ORDER — FENTANYL CITRATE (PF) 100 MCG/2ML IJ SOLN: 25.0000 ug | INTRAMUSCULAR | Status: DC | PRN

## 2023-06-20 MED ORDER — SODIUM CHLORIDE 0.9 % IV SOLN: INTRAVENOUS | Status: DC

## 2023-06-20 MED ORDER — POLYETHYLENE GLYCOL 3350 17 G PO PACK
17.0000 g | PACK | Freq: Every day | ORAL | Status: DC
  Filled 2023-06-20 (×4): qty 1

## 2023-06-20 MED ORDER — PROPOFOL 10 MG/ML IV BOLUS
INTRAVENOUS | Status: DC | PRN
  Administered 2023-06-20: 130 mg via INTRAVENOUS

## 2023-06-20 MED ORDER — ONDANSETRON HCL 4 MG PO TABS: 4.0000 mg | ORAL_TABLET | Freq: Four times a day (QID) | ORAL | Status: DC | PRN

## 2023-06-20 MED ORDER — ENOXAPARIN SODIUM 60 MG/0.6ML IJ SOSY: 60.0000 mg | PREFILLED_SYRINGE | Freq: Two times a day (BID) | INTRAMUSCULAR | Status: DC

## 2023-06-20 MED ORDER — GABAPENTIN 300 MG PO CAPS
300.0000 mg | ORAL_CAPSULE | ORAL | Status: AC
  Administered 2023-06-20: 300 mg via ORAL

## 2023-06-20 MED ORDER — METOPROLOL SUCCINATE ER 50 MG PO TB24
150.0000 mg | ORAL_TABLET | Freq: Every day | ORAL | Status: DC
  Administered 2023-06-21: 150 mg via ORAL
  Filled 2023-06-20: qty 1

## 2023-06-20 MED ORDER — TRAMADOL HCL 50 MG PO TABS: 50.0000 mg | ORAL_TABLET | Freq: Four times a day (QID) | ORAL | Status: DC | PRN

## 2023-06-20 MED ORDER — DEXAMETHASONE SODIUM PHOSPHATE 10 MG/ML IJ SOLN
INTRAMUSCULAR | Status: AC
  Filled 2023-06-20: qty 1

## 2023-06-20 MED ORDER — POTASSIUM CHLORIDE CRYS ER 20 MEQ PO TBCR
10.0000 meq | EXTENDED_RELEASE_TABLET | Freq: Every day | ORAL | Status: DC
  Administered 2023-06-21 – 2023-06-24 (×4): 10 meq via ORAL
  Filled 2023-06-20 (×8): qty 1

## 2023-06-20 MED ORDER — PHENYLEPHRINE HCL (PRESSORS) 10 MG/ML IV SOLN
INTRAVENOUS | Status: DC | PRN
  Administered 2023-06-20 (×5): 160 ug via INTRAVENOUS

## 2023-06-20 MED ORDER — ALBUMIN HUMAN 5 % IV SOLN
INTRAVENOUS | Status: AC
  Filled 2023-06-20: qty 500

## 2023-06-20 MED ORDER — CEFAZOLIN SODIUM-DEXTROSE 2-4 GM/100ML-% IV SOLN
INTRAVENOUS | Status: AC
  Filled 2023-06-20: qty 100

## 2023-06-20 MED ORDER — FENTANYL CITRATE (PF) 100 MCG/2ML IJ SOLN
INTRAMUSCULAR | Status: DC | PRN
  Administered 2023-06-20: 25 ug via INTRAVENOUS
  Administered 2023-06-20: 12.5 ug via INTRAVENOUS
  Administered 2023-06-20: 50 ug via INTRAVENOUS
  Administered 2023-06-20: 12.5 ug via INTRAVENOUS

## 2023-06-20 MED ORDER — VITAMIN D3 25 MCG (1000 UNIT) PO TABS
2000.0000 [IU] | ORAL_TABLET | Freq: Every morning | ORAL | Status: DC
  Administered 2023-06-21 – 2023-06-24 (×4): 2000 [IU] via ORAL
  Filled 2023-06-20 (×4): qty 2

## 2023-06-20 MED ORDER — PHENAZOPYRIDINE HCL 100 MG PO TABS
ORAL_TABLET | ORAL | Status: AC
  Filled 2023-06-20: qty 2

## 2023-06-20 MED ORDER — ENOXAPARIN SODIUM 40 MG/0.4ML IJ SOSY
40.0000 mg | PREFILLED_SYRINGE | INTRAMUSCULAR | Status: AC
  Administered 2023-06-20: 40 mg via SUBCUTANEOUS

## 2023-06-20 SURGICAL SUPPLY — 44 items
AGENT HMST KT MTR STRL THRMB (HEMOSTASIS)
APL PRP STRL LF DISP 70% ISPRP (MISCELLANEOUS)
BLADE SURG 15 STRL LF DISP TIS (BLADE) ×2 IMPLANT
BLADE SURG 15 STRL SS (BLADE) ×2
CHLORAPREP W/TINT 26 (MISCELLANEOUS) IMPLANT
ELECT REM PT RETURN 9FT ADLT (ELECTROSURGICAL) ×2
ELECTRODE REM PT RTRN 9FT ADLT (ELECTROSURGICAL) IMPLANT
GAUZE 4X4 16PLY ~~LOC~~+RFID DBL (SPONGE) ×2 IMPLANT
GLOVE BIOGEL PI IND STRL 6.5 (GLOVE) ×2 IMPLANT
GLOVE BIOGEL PI IND STRL 7.0 (GLOVE) ×2 IMPLANT
GLOVE ECLIPSE 6.0 STRL STRAW (GLOVE) ×2 IMPLANT
GOWN STRL REUS W/TWL LRG LVL3 (GOWN DISPOSABLE) ×2 IMPLANT
IV NS 1000ML (IV SOLUTION) ×2
IV NS 1000ML BAXH (IV SOLUTION) IMPLANT
KIT TURNOVER CYSTO (KITS) ×2 IMPLANT
MANIFOLD NEPTUNE II (INSTRUMENTS) ×2 IMPLANT
NDL HYPO 22X1.5 SAFETY MO (MISCELLANEOUS) ×2 IMPLANT
NEEDLE HYPO 22X1.5 SAFETY MO (MISCELLANEOUS) ×2
NS IRRIG 1000ML POUR BTL (IV SOLUTION) ×2 IMPLANT
NS IRRIG 500ML POUR BTL (IV SOLUTION) IMPLANT
PACK CYSTO (CUSTOM PROCEDURE TRAY) ×2 IMPLANT
PACK VAGINAL WOMENS (CUSTOM PROCEDURE TRAY) ×2 IMPLANT
RETRACTOR LONE STAR DISPOSABLE (INSTRUMENTS) ×2 IMPLANT
RETRACTOR STAY HOOK 5MM (MISCELLANEOUS) ×2 IMPLANT
SCRUB CHG 4% DYNA-HEX 4OZ (MISCELLANEOUS) ×2 IMPLANT
SET IRRIG Y TYPE TUR BLADDER L (SET/KITS/TRAYS/PACK) IMPLANT
SLEEVE SCD COMPRESS KNEE MED (STOCKING) ×2 IMPLANT
SPIKE FLUID TRANSFER (MISCELLANEOUS) IMPLANT
SURGIFLO W/THROMBIN 8M KIT (HEMOSTASIS) IMPLANT
SUT MON AB 2-0 SH 27 (SUTURE) IMPLANT
SUT MON AB 3-0 SH 27 (SUTURE) ×2
SUT MON AB 3-0 SH27 (SUTURE) IMPLANT
SUT VIC AB 0 CT1 27 (SUTURE) ×4
SUT VIC AB 0 CT1 27XBRD ANBCTR (SUTURE) IMPLANT
SUT VIC AB 0 CT1 27XCR 8 STRN (SUTURE) ×2 IMPLANT
SUT VIC AB 0 CT2 27 (SUTURE) IMPLANT
SUT VIC AB 0 CT2 8-18 (SUTURE) ×2 IMPLANT
SUT VIC AB 2-0 SH 27 (SUTURE)
SUT VIC AB 2-0 SH 27XBRD (SUTURE) IMPLANT
SUT VIC AB 3-0 SH 18 (SUTURE) IMPLANT
SUT VICRYL 2-0 SH 8X27 (SUTURE) IMPLANT
SYR BULB EAR ULCER 3OZ GRN STR (SYRINGE) ×2 IMPLANT
TOWEL OR 17X24 6PK STRL BLUE (TOWEL DISPOSABLE) ×2 IMPLANT
TRAY FOLEY W/BAG SLVR 14FR LF (SET/KITS/TRAYS/PACK) ×2 IMPLANT

## 2023-06-20 NOTE — Progress Notes (Signed)
PHARMACY - ANTICOAGULATION CONSULT NOTE  Pharmacy Consult for heparin Indication: atrial fibrillation  Allergies  Allergen Reactions   Lisinopril Cough    Patient Measurements: Height: 5\' 1"  (154.9 cm) Weight: 56.7 kg (125 lb 1.6 oz) IBW/kg (Calculated) : 47.8 Heparin Dosing Weight: 56.7 kg  Vital Signs: Temp: 98 F (36.7 C) (10/07 2008) Temp Source: Oral (10/07 2008) BP: 109/84 (10/07 2008) Pulse Rate: 109 (10/07 2008)  Labs: Recent Labs    06/20/23 1101 06/20/23 1955  HGB 13.9  --   HCT 41.0  --   CREATININE 0.60  --   TROPONINIHS  --  8    Estimated Creatinine Clearance: 44.4 mL/min (by C-G formula based on SCr of 0.6 mg/dL).   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   neurologist--- dr Pearlean Brownie;   11-06-2021  left frontoparietal cortical embolic infarct secondary AFib whilie off anticoagulant for emergent surgery then  post surgery 11-12-2022 on eliquis , acute right cerebellar PICA territory infarct ;  pt now on chronic coumadin, managed by clinic   Hyperlipidemia    Hypertension    Lumbar degenerative disc disease    Osteopenia    PAF (paroxysmal atrial fibrillation) (HCC) 2021   cardiologist-- dr Melburn Popper;  evaluted event monitor 03-26-2020 showed 1% burden  Afib   Presence of pessary    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 She is on warfarin PTA for atrial fibrillation. Warfarin held 5 days prior to procedure and bridged with Lovenox. Plan was to continue Lovenox bridge and resume warfarin POD 1. Patient developed  post-operative afib w/ RVR and hypotension. Patient with high risk of stroke (hx same with missed anticoagulation). Pharmacy consulted to manage heparin with plan to resume Lovenox bridge and warfarin once stable.  Goal of Therapy:  Heparin level 0.3-0.7 units/ml Monitor platelets by anticoagulation protocol: Yes   Plan:  -No bolus -Start heparin infusion at 800 units/hr -Check 8 hour heparin level -Monitor for new or worsening bleeding given she is post-op -Follow for transition back to Lovenox/warfarin as indicated   Pricilla Riffle, PharmD, BCPS Clinical Pharmacist 06/20/2023 9:01 PM

## 2023-06-20 NOTE — Assessment & Plan Note (Signed)
-  likely due to both atrial fibrillation with RVR and hypovolemia -BP now normotensive following fluid bolus -keep on gentle IV fluid hydration overnight -Hgb on presentation of 13.9 and intra-operative blood loss was minimal. Will check repeat Hgb.  -hold home HCTZ

## 2023-06-20 NOTE — Anesthesia Preprocedure Evaluation (Addendum)
Anesthesia Evaluation  Patient identified by MRN, date of birth, ID band Patient awake    Reviewed: Allergy & Precautions, NPO status , Patient's Chart, lab work & pertinent test results  Airway Mallampati: II  TM Distance: >3 FB Neck ROM: Full    Dental no notable dental hx.    Pulmonary    Pulmonary exam normal        Cardiovascular hypertension,  Rhythm:Irregular Rate:Normal     Neuro/Psych CVA  negative psych ROS   GI/Hepatic Neg liver ROS,GERD  Medicated,,  Endo/Other  negative endocrine ROS    Renal/GU negative Renal ROS  Female GU complaint     Musculoskeletal  (+) Arthritis , Osteoarthritis,    Abdominal Normal abdominal exam  (+)   Peds  Hematology Lab Results      Component                Value               Date                      WBC                      11.1 (H)            10/05/2022                HGB                      14.4                10/05/2022                HCT                      44.1                10/05/2022                MCV                      89                  10/05/2022                PLT                      301                 10/05/2022              Anesthesia Other Findings   Reproductive/Obstetrics                             Anesthesia Physical Anesthesia Plan  ASA: 3  Anesthesia Plan: General   Post-op Pain Management: Tylenol PO (pre-op)* and Gabapentin PO (pre-op)*   Induction: Intravenous  PONV Risk Score and Plan: 3 and Ondansetron, Dexamethasone and Treatment may vary due to age or medical condition  Airway Management Planned: Mask and LMA  Additional Equipment: None  Intra-op Plan:   Post-operative Plan: Extubation in OR  Informed Consent: I have reviewed the patients History and Physical, chart, labs and discussed the procedure including the risks, benefits and alternatives for the proposed anesthesia with the patient or  authorized representative who has indicated  his/her understanding and acceptance.     Dental advisory given  Plan Discussed with: CRNA  Anesthesia Plan Comments: (- Coumadin held appropriately, Lovenox DOS)       Anesthesia Quick Evaluation

## 2023-06-20 NOTE — Progress Notes (Signed)
Patient transported to room 1430 by 2 surgical staff members via stretcher. Report previously given to [facility].

## 2023-06-20 NOTE — Assessment & Plan Note (Signed)
-   continue atorvastatin

## 2023-06-20 NOTE — Consult Note (Signed)
Initial Consultation Note   Patient: Hannah Downs BJY:[id number] DOB: 1945-12-10 PCP: [name], MD DOA: 06/20/2023 DOS: the patient was seen and examined on 06/20/2023 Primary service: Marguerita Beards, MD  Referring physician: Dr. Lanetta Inch Reason for consult: post-operative atrial fibrillation RVR and hypotension   Assessment/Plan: Assessment and Plan:  Pleasant 77yo F w/hx of a.fib, recurrent CVA who developed post-operative a.fib with RVR and hypotension following pelvic organ prolapse repair.   Paroxysmal atrial fibrillation with RVR -likely secondary to surgical stress  -had rates up to 150 post-operatively. Improved now to 90-110 with fluid bolus, albumin and IV Lopressor -she reported pressure-like chest pain. EKG on my review in a.fib with RVR and non-specific flattening t waves to inferolateral leads. Will obtain troponin but low suspicion of her symptoms being ACS related.  -continue gentle fluid hydration overnight -keep on continuous telemetry -Goal HR <100 with SBP >95  -CHA2D2VASc score of 6 (Age, gender, HTN, stroke hx)  -Warfarin has been held since 10/2 and has been on therapeutic Lovenox Bridge. Today she was administered only prophylactic dosing. However the benefit of resuming therapeutic anticoagulation coverage outweighs her risk given hx of recent recurrent strokes in 2023 that occurred during periods of missed anticoagulation. Will initiate IV heparin infusion without bolus overnight and will resume Lovenox bridging in the morning if she does not develop any worsening bleeding.  -check repeat INR in the morning  Hypotension  -likely due to both atrial fibrillation with RVR and hypovolemia -BP now normotensive following fluid bolus -keep on gentle IV fluid hydration overnight -Hgb on presentation of 13.9 and intra-operative blood loss was minimal. Will check repeat Hgb.  -hold home HCTZ   Hx of recurrent CVA  11/07/2021- left  frontoparietal infarct following reversal of her warfarin for hernia repair surgery. She was then transitioned to Eliquis but had recurrent right cerebellar infarct (11/11/2021) despite compliance with anticoagulation. Neurology did not consider it to be Eliquis failure as she had only been on it for a few days prior to her second stroke but due to high cost she was switch back to Eliquis.  -Given her a.fib and high risk of stroke, will start IV heparin overnight with Lovenox bridging tomorrow if she does not develop any worsening post-operative bleed.    HLD  -continue atorvastatin  GERD  -continue PPI  Pelvic organ prolapse -POD 0 of LeForte Colpocleisis, levator plication with perineorrhaphy -bleeding into foley is minimal around 100cc on evaluation -pain management per primary GYN team -pt has indwelling foley and will discharge home with it for one week  TRH will continue to follow the patient.  HPI: Hannah Downs is a 78 y.o. female with past medical history of Paroxysmal atrial fibrillation on warfarin, recurrent CVA, HLD, who presents today initially for outpatient LeForte Colpocleisis, levator plication with perineorrhaphy, and cystoscopy due to pelvic organ prolapse. However, post-operatively she developed atrial fibrillation with RVR with HR up to 150 and hypotension down to 60/40. Per gynecologist Dr. Florian Buff her blood loss was minimal at approximately 150cc.   Both her blood pressure and HR improved following administration of IV fluid bolus, albumin and a total of 5mg  IV Lopressor by anesthesiology.   Pt reports feeling anxious about her surgery this past week. About 3 days ago she was feeling dizzy and had palpitations. Has not been staying hydrated and mostly just drinks coffee in the morning. Today following the surgery she felt dizzy and had intermittent pressure-like chest pain lasting minutes that have  now resolved.  She was on Lovenox bridge for her atrial  fibrillation prior to the procedure and today was administered only a prophylactic Lovenox dose.     Review of Systems: As mentioned in the history of present illness. All other systems reviewed and are negative. Past Medical History:  Diagnosis Date   Anticoagulated on Coumadin    managed by coumadin clinic   Arthritis    Dyspnea    exertion   GERD (gastroesophageal reflux disease)    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   neurologist--- dr Pearlean Brownie;   11-06-2021  left frontoparietal cortical embolic infarct secondary AFib whilie off anticoagulant for emergent surgery then  post surgery 11-12-2022 on eliquis , acute right cerebellar PICA territory infarct ;  pt now on chronic coumadin, managed by clinic   Hyperlipidemia    Hypertension    Lumbar degenerative disc disease    Osteopenia    PAF (paroxysmal atrial fibrillation) (HCC) 2021   cardiologist-- dr Melburn Popper;  evaluted event monitor 03-26-2020 showed 1% burden  Afib   Presence of pessary    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   Seasonal allergies    Varicose vein    right leg   Vitamin D deficiency    Wears glasses    Past Surgical History:  Procedure Laterality Date   DILATION AND CURETTAGE OF UTERUS  1998   FEMORAL HERNIA REPAIR Left 11/02/2006   @[facility]   by dr Grier Mitts;   Emergent Inguinal exploratory & primary repair femoral hernia and Reduction of small bowel obstruction   INGUINAL HERNIA REPAIR Left 02/03/2007   @[facility]  by dr Grier Mitts;   Open recurrent repair [facility] w/ mesh   INGUINAL HERNIA REPAIR N/A 11/04/2021   Procedure: lysis of adhesions reduction of incarcerated recurrent left femoral hernia laparoscopic repair. laparoscopic right femoral hernia repair and right obturator repair with mesh. tap block;  Surgeon: Karie Soda, MD;  Location: [facility];  Service: General;  Laterality: N/A;   ROBOTIC ASSISTED DIAGNOSTIC LAPAROSCOPY  09/18/2021    @[facility]  by dr Judie Petit. Daphine Deutscher;   ATTEMPTED  reduction recurrent inguinal hernia,  aborted due to involvement of bladder and possible vagina   TOTAL HIP ARTHROPLASTY Right 06/26/2014   Procedure: RIGHT TOTAL HIP ARTHROPLASTY ANTERIOR APPROACH WITH ACETABULAR AUTOGRAFT;  Surgeon: Loanne Drilling, MD;  Location: [facility];  Service: Orthopedics;  Laterality: Right;   Social History:  reports that she has never smoked. She has never used smokeless tobacco. She reports that she does not drink alcohol and does not use drugs.  Allergies  Allergen Reactions   Lisinopril Cough    Family History  Problem Relation Age of Onset   Cancer Mother    Stroke Father     Prior to Admission medications   Medication Sig Start Date End Date Taking? Authorizing Provider  aspirin EC 81 MG tablet Take 81 mg by mouth daily. Swallow whole.   Yes [provider]  atorvastatin (LIPITOR) 20 MG tablet Take 1 tablet by mouth once daily Patient taking differently: Take 20 mg by mouth at bedtime. 12/09/22  Yes Nahser, Deloris Ping, MD  Cholecalciferol (VITAMIN D3) 50 MCG (2000 UT) TABS Take 2,000 Units by mouth in the morning.   Yes [provider]  enoxaparin (LOVENOX) 60 MG/0.6ML injection Inject 0.6 mLs (60 mg total) into the skin every 12 (twelve) hours. 06/16/23  Yes Nahser, Deloris Ping, MD  hydrochlorothiazide (HYDRODIURIL) 25 MG  tablet Take 1 tablet (25 mg total) by mouth daily. Patient taking differently: Take 25 mg by mouth daily. 10/05/22  Yes Nahser, Deloris Ping, MD  metoprolol succinate (TOPROL-XL) 100 MG 24 hr tablet TAKE 1 & 1/2 (ONE & ONE-HALF) TABLETS BY MOUTH ONCE DAILY (TAKE  WITH  OR  IMMEDIATELY  FOLLOWING  A  MEAL) Patient taking differently: Take 150 mg by mouth daily. 11/12/22  Yes Nahser, Deloris Ping, MD  omeprazole (PRILOSEC OTC) 20 MG tablet Take 20 mg by mouth daily.   Yes [provider]  potassium chloride (KLOR-CON) 10 MEQ tablet Take 1 tablet (10 mEq total) by mouth daily. 10/05/22  Yes  Nahser, Deloris Ping, MD  trospium (SANCTURA) 20 MG tablet Take 1 tablet (20 mg total) by mouth 2 (two) times daily. Patient taking differently: Take 20 mg by mouth 2 (two) times daily. Morning and afternoon 01/13/23  Yes Zuleta, Joan Mayans, NP  warfarin (COUMADIN) 5 MG tablet TAKE 1 TO 1 & 1/2 (ONE & ONE-HALF) TABLETS BY MOUTH ONCE DAILY 06/06/23  Yes Nahser, Deloris Ping, MD  acetaminophen (TYLENOL) 500 MG tablet Take 1,000 mg by mouth every 6 (six) hours as needed for moderate pain.    [provider]  acetaminophen (TYLENOL) 500 MG tablet Take 1 tablet (500 mg total) by mouth every 6 (six) hours as needed (pain). Patient not taking: Reported on 06/09/2023 06/01/23   Selmer Dominion, NP  estradiol (ESTRACE) 0.1 MG/GM vaginal cream PLACE 0.5 GRAMS VAGINALLY TWICE A WEEK Patient taking differently: Place 1 Applicatorful vaginally 2 (two) times a week. Monday and Friday's evening 03/28/23   Selmer Dominion, NP  ibuprofen (ADVIL) 600 MG tablet Take 1 tablet (600 mg total) by mouth every 6 (six) hours as needed. Patient not taking: Reported on 06/13/2023 06/01/23   Selmer Dominion, NP  polyethylene glycol powder (GLYCOLAX/MIRALAX) 17 GM/SCOOP powder Take 17 g by mouth daily. Drink 17g (1 scoop) dissolved in water per day. Patient not taking: Reported on 06/09/2023 06/01/23   Selmer Dominion, NP    Physical Exam: Vitals:   06/20/23 1815 06/20/23 1830 06/20/23 1925 06/20/23 2008  BP: (!) 87/66 91/73 107/83 109/84  Pulse: 88 95 (!) 105 (!) 109  Resp: (!) 22 19 20 18   Temp:  97.8 F (36.6 C) 97.8 F (36.6 C) 98 F (36.7 C)  TempSrc:   Oral Oral  SpO2: 97% 96% 98% 96%  Weight:      Height:       Constitutional: NAD, calm, comfortable,well-appearing elderly female sitting upright in bed Eyes: lids and conjunctivae normal ENMT: Mucous membranes are moist.  Neck: normal, supple Respiratory:  no wheezing, no crackles. Normal respiratory effort. No accessory muscle use.  Cardiovascular:   irregularly irregular rhythm,, no murmurs / rubs / gallops. No extremity edema.  Abdomen:soft, no tenderness,non-distended, no masses palpated. Bowel sounds positive.  Musculoskeletal: no clubbing / cyanosis. No joint deformity upper and lower extremities.  Normal muscle tone.  Skin: no rashes, lesions, ulcers. No induration Neurologic: CN 2-12 grossly intact.  Strength 5/5 in all 4.  Psychiatric: Normal judgment and insight. Alert and oriented x 3. Normal and pleasant mood.   Data Reviewed:   See HPI     Family Communication: pt will update family herself and did not want me to call them.  Primary team communication: Dr. Florian Buff aware I will be following pt Thank you very much for involving [place] in the care of your patient.  Author: Gretel Acre  Manteca Blas, DO 06/20/2023 8:52 PM  For on call review [email or web address].

## 2023-06-20 NOTE — Interval H&P Note (Signed)
History and Physical Interval Note:  06/20/2023 12:51 PM  Hannah Downs  has presented today for surgery, with the diagnosis of anterior vaginal prolapse; posterior vaginal prolapse; uterovaginal prolapse incomplete.  The various methods of treatment have been discussed with the patient and family. After consideration of risks, benefits and other options for treatment, the patient has consented to  Procedure(s): COLPOCLEISIS (N/A), levator plication and perineorrhaphy, CYSTOSCOPY (N/A) as a surgical intervention.  The patient's history has been reviewed, patient examined, no change in status, stable for surgery.  I have reviewed the patient's chart and labs.  Questions were answered to the patient's satisfaction.     Marguerita Beards

## 2023-06-20 NOTE — Assessment & Plan Note (Addendum)
11/07/2021- left frontoparietal infarct following reversal of her warfarin for hernia repair surgery. She was then transitioned to Eliquis but had recurrent right cerebellar infarct (11/11/2021) despite compliance with anticoagulation. Neurology did not consider it to be Eliquis failure as she had only been on it for a few days prior to her second stroke but due to high cost she was switch back to Eliquis.  -Given her a.fib and high risk of stroke, will start IV heparin overnight with Lovenox bridging tomorrow if she does not develop any worsening post-operative bleed.

## 2023-06-20 NOTE — Telephone Encounter (Signed)
Hannah Downs underwent LeForte Colpocleisis, levator plication, perineorrhaphy, cystoscopy, excision of urethral prolapse on 06/20/23.   She was discharged with a catheter- did not have a voiding trial.  Please call her for a routine post op check and to schedule a voiding trial by Monday 10/14. Thanks!  Marguerita Beards, MD

## 2023-06-20 NOTE — Transfer of Care (Signed)
Immediate Anesthesia Transfer of Care Note  Patient: Hannah Downs  Procedure(s) Performed: LEFORTE COLPOCLEISIS; LEVATOR PLICATION WITH PERINEORRHAPHY (Vagina ) CYSTOSCOPY (Bladder)  Patient Location: PACU  Anesthesia Type:General  Level of Consciousness: awake, alert , and oriented  Airway & Oxygen Therapy: Patient Spontanous Breathing and Patient connected to nasal cannula oxygen  Post-op Assessment: Report given to RN and Post -op Vital signs reviewed and stable Dr. Nance Pew notified of pt's IV Neo restart. BPs up to 90s/50s, A fib 110s.  Post vital signs: Reviewed and stable  Last Vitals:  Vitals Value Taken Time  BP 96/60 06/20/23 1531  Temp 36.6 C 06/20/23 1526  Pulse 99 06/20/23 1535  Resp 25 06/20/23 1535  SpO2 99 % 06/20/23 1535  Vitals shown include unfiled device data.  Last Pain:  Vitals:   06/20/23 1526  TempSrc:   PainSc: 0-No pain      Patients Stated Pain Goal: 5 (06/20/23 1526)  Complications: No notable events documented.

## 2023-06-20 NOTE — Anesthesia Postprocedure Evaluation (Signed)
Anesthesia Post Note  Patient: Hannah Downs  Procedure(s) Performed: LEFORTE COLPOCLEISIS; LEVATOR PLICATION WITH PERINEORRHAPHY (Vagina ) CYSTOSCOPY (Bladder)     Patient location during evaluation: PACU Anesthesia Type: General Level of consciousness: awake and alert Pain management: pain level controlled Respiratory status: spontaneous breathing, nonlabored ventilation, respiratory function stable and patient connected to nasal cannula oxygen Cardiovascular status: orthostatic hypotension. Postop Assessment: no apparent nausea or vomiting Anesthetic complications: yes (see comments below) Comments: Despite aggressive IV and PO resuscitation, pt remained orthostatic upon sitting and standing. When standing, pt became hypotensive with pre-syncopal symptoms. I spoke to Dr. Florian Buff and we agreed that the patient should be admitted for observation.   No notable events documented.  Last Vitals:  Vitals:   06/20/23 1811 06/20/23 1815  BP: (!) 88/62 (!) 87/66  Pulse:  88  Resp: (!) 24 (!) 22  Temp:    SpO2:  (!) 83%    Last Pain:  Vitals:   06/20/23 1700  TempSrc:   PainSc: 6                  Raley Novicki L Lafaye Mcelmurry

## 2023-06-20 NOTE — Op Note (Signed)
Operative Note  Preoperative Diagnosis: anterior vaginal prolapse, posterior vaginal prolapse, and uterovaginal prolapse, complete  Postoperative Diagnosis: same  Procedures performed:  LeForte Colpocleisis, levator plication, perineorrhaphy, cystoscopy, excision of urethral prolapse  Implants: none  Attending Surgeon: Lanetta Inch, MD  Assistant Surgeon: Jay Schlichter, MD  Anesthesia: General endotracheal  Findings: On vaginal exam, stage IV prolapse present. On cystoscopy, normal bladder and urethral mucosa without injury or lesion. Brisk bilateral ureteral efflux noted. Posterior urethral prolapse present.   Specimens: none  Estimated blood loss: 150 mL  IV fluids: 150 mL  Urine output: 100 mL  Complications: none  Procedure in Detail:  After informed consent was obtained, the patient was taken to the operating room where she was placed under anesthesia.  She was then placed in the dorsal lithotomy position, taking care to avoid traction on the extremities. She was then prepped and draped in the usual sterile fashion.  A self-retaining lonestar retractor was placed using four elastic blue stays.  After a foley catheter was inserted into the urethra, the location of the midurethra was palpated. A tenaculum was placed on the cervix.  1% lidocaine with epinephrine was injected into the vaginal mucosa circumferentially.  A marking pen was used to delineate anterior and posterior rectangles of vaginal mucosa to remove with remaining lateral tunnels. 1% lidocaine with epinephrine was injected into the mucosa. An incision was made with a 15 blade scalpel along these marked lines. After an Allis clamp was placed on the vaginal mucosa, the underlying vaginal muscularis was dissected off the mucosa.  The vaginal mucosa was excised off each anterior and posterior section. Excellent hemostasis was obtained with cautery. The mucosa on the lateral sides was closed into a tunnel with a 2-0  vicryl suture.  Serial pursestring sutures were used to imbricate and the cervix and vaginal prolapse using 2-0 vicryl. Anterior and posterior plication sutures were placed to reapproximate the epithelium with 0-vicryl. The mucosa was then closed with a 0-vicryl suture in a running fashion.   The Foley catheter was removed.  A 70-degree cystoscope was introduced, and 360-degree inspection revealed no trauma or lesions the bladder, with bilateral ureteral efflux.  The bladder was drained and the cystoscope was removed.  The Foley catheter was reinserted.   Attention was then turned to the perineum. Two allis clamps were placed at the introitus. The perineum was injected with 1% lidocaine with epinephrine. A diamond shaped incision was made over the perineum and excess skin was removed. Dissection was performed with Metzenbaum scissors to separate the mucosa from the underlying tissue. Levator plication was performed by approximating the levator muscles to the midline with interrupted 0-vicryl sutures. The perineal body was then reapproximated with three interrupted 0-vicryl sutures. The perineal skin was then closed with a 2-0 monocryl in a subcutaneous and running fashion. Irrigation was performed and good hemostasis was noted.   The posterior urethral prolapse was grasped with an allis clamp. The excess tissue was removed with the bovie. The edges were reapproximated with 3-0 moncryl. Hemostasis was again noted. The patient tolerated the procedure well.  She was awakened from anesthesia and transferred to the recovery room in stable condition. All counts were correct x 2.    Hannah Beards, MD

## 2023-06-20 NOTE — Assessment & Plan Note (Addendum)
-  likely secondary to surgical stress  -had rates up to 150 post-operatively. Improved now to 90-110 with fluid bolus, albumin and IV Lopressor -she reported pressure-like chest pain. EKG on my review in a.fib with RVR and non-specific flattening t waves to inferolateral leads. Will obtain troponin but low suspicion of her symptoms being ACS related.  -continue gentle fluid hydration overnight -keep on continuous telemetry -Goal HR <100 with SBP >95  -already took home dose of 150mg  metoprolol today. Will continue in the morning. -CHA2D2VASc score of 6 (Age, gender, HTN, stroke hx)  -Warfarin has been held since 10/2 and has been on therapeutic Lovenox Bridge. Today she was administered only prophylactic dosing. However the benefit of resuming therapeutic anticoagulation coverage outweighs her risk given hx of recent recurrent strokes in 2023 that occurred during periods of missed anticoagulation. Will initiate IV heparin infusion without bolus overnight and will resume Lovenox bridging in the morning if she does not develop any worsening bleeding.  -check repeat INR in the morning

## 2023-06-20 NOTE — Progress Notes (Signed)
SPO2 placed on 2L [place].

## 2023-06-20 NOTE — Discharge Instructions (Addendum)

## 2023-06-20 NOTE — Anesthesia Procedure Notes (Signed)
Procedure Name: LMA Insertion Date/Time: 06/20/2023 1:20 PM  Performed by: Briant Sites, CRNAPre-anesthesia Checklist: Patient identified, Emergency Drugs available, Suction available and Patient being monitored Patient Re-evaluated:Patient Re-evaluated prior to induction Oxygen Delivery Method: Circle system utilized Preoxygenation: Pre-oxygenation with 100% oxygen Induction Type: IV induction Ventilation: Mask ventilation without difficulty LMA: LMA inserted LMA Size: 4.0 Number of attempts: 1 Airway Equipment and Method: Bite block Placement Confirmation: positive ETCO2 Tube secured with: Tape Dental Injury: Teeth and Oropharynx as per pre-operative assessment

## 2023-06-20 NOTE — Assessment & Plan Note (Signed)
continue PPI

## 2023-06-20 NOTE — Progress Notes (Signed)
Talked with Dr Curt Jews okay to try to discharge.  Patient became lightheaded and SOB with standing.  She was placed back on stretcher and on the monitor.  Anesthesia called.

## 2023-06-21 ENCOUNTER — Encounter (HOSPITAL_BASED_OUTPATIENT_CLINIC_OR_DEPARTMENT_OTHER): Payer: Self-pay | Admitting: Obstetrics and Gynecology

## 2023-06-21 ENCOUNTER — Other Ambulatory Visit: Payer: Self-pay | Admitting: Cardiovascular Disease

## 2023-06-21 DIAGNOSIS — I480 Paroxysmal atrial fibrillation: Secondary | ICD-10-CM | POA: Diagnosis not present

## 2023-06-21 LAB — CBC
HCT: 32.8 % — ABNORMAL LOW (ref 36.0–46.0)
Hemoglobin: 10.8 g/dL — ABNORMAL LOW (ref 12.0–15.0)
MCH: 30.5 pg (ref 26.0–34.0)
MCHC: 32.9 g/dL (ref 30.0–36.0)
MCV: 92.7 fL (ref 80.0–100.0)
Platelets: 248 10*3/uL (ref 150–400)
RBC: 3.54 MIL/uL — ABNORMAL LOW (ref 3.87–5.11)
RDW: 12.8 % (ref 11.5–15.5)
WBC: 14.9 10*3/uL — ABNORMAL HIGH (ref 4.0–10.5)
nRBC: 0 % (ref 0.0–0.2)

## 2023-06-21 LAB — BASIC METABOLIC PANEL
Anion gap: 14 (ref 5–15)
BUN: 13 mg/dL (ref 8–23)
CO2: 21 mmol/L — ABNORMAL LOW (ref 22–32)
Calcium: 8.7 mg/dL — ABNORMAL LOW (ref 8.9–10.3)
Chloride: 91 mmol/L — ABNORMAL LOW (ref 98–111)
Creatinine, Ser: 0.62 mg/dL (ref 0.44–1.00)
GFR, Estimated: >60 mL/min (ref >60)
Glucose, Bld: 161 mg/dL — ABNORMAL HIGH (ref 70–99)
Potassium: 3.5 mmol/L (ref 3.5–5.1)
Sodium: 126 mmol/L — ABNORMAL LOW (ref 135–145)

## 2023-06-21 LAB — PROTIME-INR
INR: 1.1 (ref 0.8–1.2)
Prothrombin Time: 14.2 s (ref 11.4–15.2)

## 2023-06-21 LAB — HEPARIN LEVEL (UNFRACTIONATED)
Heparin Unfractionated: 0.41 [IU]/mL (ref 0.30–0.70)
Heparin Unfractionated: 0.3 [IU]/mL (ref 0.30–0.70)

## 2023-06-21 MED ORDER — METOPROLOL SUCCINATE ER 100 MG PO TB24: 200.0000 mg | ORAL_TABLET | Freq: Every day | ORAL | Status: DC

## 2023-06-21 MED ORDER — WARFARIN - PHARMACIST DOSING INPATIENT: Freq: Every day | Status: DC

## 2023-06-21 MED ORDER — GUAIFENESIN 100 MG/5ML PO LIQD
5.0000 mL | ORAL | Status: DC | PRN
  Administered 2023-06-21 – 2023-06-23 (×6): 5 mL via ORAL
  Filled 2023-06-21 (×6): qty 10

## 2023-06-21 MED ORDER — METOPROLOL TARTRATE 5 MG/5ML IV SOLN
5.0000 mg | Freq: Once | INTRAVENOUS | Status: AC
  Administered 2023-06-21: 5 mg via INTRAVENOUS
  Filled 2023-06-21: qty 5

## 2023-06-21 MED ORDER — CHLORHEXIDINE GLUCONATE CLOTH 2 % EX PADS
6.0000 | MEDICATED_PAD | Freq: Every day | CUTANEOUS | Status: DC
  Administered 2023-06-21 – 2023-06-24 (×4): 6 via TOPICAL

## 2023-06-21 MED ORDER — POTASSIUM CHLORIDE CRYS ER 20 MEQ PO TBCR
40.0000 meq | EXTENDED_RELEASE_TABLET | Freq: Once | ORAL | Status: AC
  Administered 2023-06-21: 40 meq via ORAL
  Filled 2023-06-21: qty 2

## 2023-06-21 MED ORDER — WARFARIN SODIUM 5 MG PO TABS
7.5000 mg | ORAL_TABLET | Freq: Once | ORAL | Status: AC
  Administered 2023-06-21: 7.5 mg via ORAL
  Filled 2023-06-21: qty 1

## 2023-06-21 NOTE — Plan of Care (Signed)
  Problem: Education: Goal: Knowledge of the prescribed therapeutic regimen will improve Outcome: Progressing   Problem: Education: Goal: Knowledge of General Education information will improve Description: Including pain rating scale, medication(s)/side effects and non-pharmacologic comfort measures Outcome: Progressing   Problem: Coping: Goal: Level of anxiety will decrease Outcome: Progressing   Problem: Pain Managment: Goal: General experience of comfort will improve Outcome: Progressing   Problem: Education: Goal: Knowledge of the prescribed therapeutic regimen will improve Outcome: Progressing   Problem: Education: Goal: Knowledge of General Education information will improve Description: Including pain rating scale, medication(s)/side effects and non-pharmacologic comfort measures Outcome: Progressing   Problem: Coping: Goal: Level of anxiety will decrease Outcome: Progressing   Problem: Pain Managment: Goal: General experience of comfort will improve Outcome: Progressing   Problem: Education: Goal: Knowledge of the prescribed therapeutic regimen will improve Outcome: Progressing   Problem: Education: Goal: Knowledge of General Education information will improve Description: Including pain rating scale, medication(s)/side effects and non-pharmacologic comfort measures Outcome: Progressing   Problem: Coping: Goal: Level of anxiety will decrease Outcome: Progressing   Problem: Pain Managment: Goal: General experience of comfort will improve Outcome: Progressing

## 2023-06-21 NOTE — Consult Note (Signed)
PHARMACY - ANTICOAGULATION CONSULT NOTE  Pharmacy Consult for warfarin Indication: atrial fibrillation  Allergies  Allergen Reactions   Lisinopril Cough    Patient Measurements: Height: 5\' 1"  (154.9 cm) Weight: 56.7 kg (125 lb 1.6 oz) IBW/kg (Calculated) : 47.8 Heparin Dosing Weight: 56.7 kg  Vital Signs: Temp: 98.8 F (37.1 C) (10/08 1425) Temp Source: Oral (10/08 1425) BP: 130/90 (10/08 1425) Pulse Rate: 105 (10/08 1425)  Labs: Recent Labs    06/20/23 1101 06/20/23 1955 06/20/23 2047 06/21/23 0610 06/21/23 1414  HGB 13.9  --  11.2* 10.8*  --   HCT 41.0  --  33.3* 32.8*  --   PLT  --   --   --  248  --   LABPROT  --   --   --  14.2  --   INR  --   --   --  1.1  --   HEPARINUNFRC  --   --   --  0.41 0.30  CREATININE 0.60  --   --  0.62  --   TROPONINIHS  --  8  --   --   --     Estimated Creatinine Clearance: 44.4 mL/min (by C-G formula based on SCr of 0.62 mg/dL).   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   neurologist--- dr Pearlean Brownie;   11-06-2021  left frontoparietal cortical embolic infarct secondary AFib whilie off anticoagulant for emergent surgery then  post surgery 11-12-2022 on eliquis , acute right cerebellar PICA territory infarct ;  pt now on chronic coumadin, managed by clinic   Hyperlipidemia    Hypertension    Lumbar degenerative disc disease    Osteopenia    PAF (paroxysmal atrial fibrillation) (HCC) 2021   cardiologist-- dr Melburn Popper;  evaluted event monitor 03-26-2020 showed 1% burden  Afib   Presence of pessary    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   Seasonal allergies    Varicose vein    right leg   Vitamin D deficiency    Wears glasses      Assessment: 77 YO  female s/p pelvic organ prolapse repair with PMH of CVA and atrial fibrillation on chronic warfarin. Home dose is 5 mg every day except 7.5 mg on Tuesdays. Warfarin held 5 days prior to surgery and patient bridged on 60 mg BID Lovenox. Post-op patient developed Afib with RVR and hypotension. Patient is high risk for recurrent stroke due to hx of CVA with missed anticoagulation. Patient currently being bridged with heparin, pharmacy is being consulted to restart warfarin.  10/7 No bolus, started on continuous heparin 800 units/hr   10/8 @ 0600 Heparin level = 0.41 (therapeutic) with heparin gtt @ 800 units/hr INR = 1.1 Hgb = 10.8, Pltc 248 No complications of therapy noted   PM HL 0.30, therapeutic  No line or bleeding issues per RN    Goal of Therapy:  INR 2-3 Heparin level 0.3-0.7 units/ml Monitor platelets by anticoagulation protocol: Yes   Plan:  -Continue heparin infusion at 800 units/hr -Initiate warfarin 7.5 mg  -Check INR with AM labs and adjust dose as needed  - [facility] with AM labs  -Monitor for new or worsening bleeding given she is post-op  Adalberto Cole, PharmD, BCPS 06/21/2023 4:21 PM

## 2023-06-21 NOTE — TOC CM/SW Note (Signed)
Transition of Care [facility]) - Inpatient Brief Assessment   Patient Details  Name: Hannah Downs MRN: [id number] Date of Birth: 1946/03/30  Transition [facility]) CM/SW Contact:    Howell Rucks, RN Phone Number: 06/21/2023, 10:51 AM   Clinical Narrative: Met with pt and pt's dtr at bedside to introduce role of TOC/NCM and review for dc planning, pt confirms she has an established PCP and pharmacy, no current home care services, has walker and shower chair at home, reports she feels safe returning home with support from her family, family to provide transportation at discharge. TOC Brief Assessment completed. No TOC needs identified.     Transition of Care Asessment: Insurance and Status: Insurance coverage has been reviewed   Home environment has been reviewed: return home with family support Prior level of function:: Independent Prior/Current Home Services: No current home services Social Determinants of Health Reivew: SDOH reviewed no interventions necessary Readmission risk has been reviewed: Yes Transition of care needs: no transition of care needs at this time

## 2023-06-21 NOTE — Progress Notes (Addendum)
[facility] Progress Note  Subjective: POD#1 s/p colpocleisis, admitted for rate control of atrial fibrillation. Tolerating regular diet. Denies pain, only feels sore. Foley catheter in place- had some drainage around foley overnight but urine in bag this morning. Has not ambulated as she has not been able to tolerate standing due to elevated HR.   Objective: Vitals:   06/21/23 0941 06/21/23 1425  BP: 117/81 (!) 130/90  Pulse: (!) 105 (!) 105  Resp:  20  Temp: 98 F (36.7 C) 98.8 F (37.1 C)  SpO2: 99% 99%     General: alert and cooperative Resp: clear to auscultation bilaterally Cardio: regular rate and rhythm, S1, S2 normal, no murmur, click, rub or gallop Extremities: extremities normal, atraumatic, no cyanosis or edema Vaginal Bleeding: minimal  Lab Results  Component Value Date   WBC 14.9 (H) 06/21/2023   HGB 10.8 (L) 06/21/2023   HCT 32.8 (L) 06/21/2023   MCV 92.7 06/21/2023   PLT 248 06/21/2023   Lab Results  Component Value Date   NA 126 (L) 06/21/2023   K 3.5 06/21/2023   CL 91 (L) 06/21/2023   CO2 21 (L) 06/21/2023    Assessment/Plan:  - Appreciate hospitalist team recommendations for rate control of A. Fib and anticoagulation. Currently on heparin drip with plan to transition back to warfarin.  - regular diet - ambulate as tolerate, SCDs in bed - pain control with PO meds prn- tylenol or oxycodone - will replace electrolytes, repeat BMP in AM - Keep foley due to removal of urethral prolapse. Will keep on discharge and remove next week in office. Can size up if needed due to leakage.    LOS: 1 day    Marguerita Beards, MD 06/21/2023, 11:57 AM

## 2023-06-21 NOTE — Progress Notes (Signed)
PROGRESS NOTE    Hannah Downs  ZOX:[id number] DOB: August 10, 1946 DOA: 06/20/2023 PCP: [name], MD     Brief Narrative:  Hannah Downs is a 77yo F w/ hx of paroxysmal a.fib, recurrent CVA, on coumadin who developed post-operative a.fib with RVR and hypotension following pelvic organ prolapse repair on 10/7. [facility] was consulted for medical management.   New events last 24 hours / Subjective: Patient sitting in recliner this morning, RN reports that patient heart rate jumped up to 150s with ambulation to the bathroom.  Patient's main complaint is weakness.  At rest, her heart rate is irregular in the 120s.  Currently on IV heparin.  Her daughter is at bedside.  Assessment & Plan:   Principal Problem:   Paroxysmal atrial fibrillation with RVR (HCC) Active Problems:   Hx of stroke without residual deficits   Hyperlipidemia   Gastroesophageal reflux disease without esophagitis   Hypotension   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 organs   Paroxysmal A-fib with RVR -Insetting of postop -Currently on IV heparin, resume Coumadin today and monitor INR -Toprol -Telemetry   Hypotension -Insetting of postop -HCTZ on hold -Improved   History of recurrent CVA -11/07/2021 left frontoparietal infarct following reversal of her warfarin for hernia repair surgery. She was then transitioned to Eliquis but had recurrent right cerebellar infarct (11/11/2021) despite compliance with anticoagulation. Neurology did not consider it to be Eliquis failure as she had only been on it for a few days prior to her second stroke but due to high cost she was switch back to coumadin.  -Currently on IV heparin, resume Coumadin today and monitor INR  Hyperlipidemia -Lipitor  Pelvic organ prolapse -Per Dr. Florian Buff  Hypokalemia -Replace   Antimicrobials:  Anti-infectives (From admission, onward)    Start     Dose/Rate Route Frequency Ordered Stop   06/20/23 1030  ceFAZolin (ANCEF) IVPB 2g/100 mL  premix        2 g 200 mL/hr over 30 Minutes Intravenous On call to O.R. 06/20/23 1018 06/20/23 1326        Objective: Vitals:   06/20/23 2137 06/21/23 0154 06/21/23 0526 06/21/23 0941  BP: 110/83 95/72 109/84 117/81  Pulse:  91 (!) 106 (!) 105  Resp: 18 18 18    Temp: 98.1 F (36.7 C) 97.6 F (36.4 C) 98 F (36.7 C) 98 F (36.7 C)  TempSrc: Oral Oral Oral Oral  SpO2: 98% 96% 97% 99%  Weight:      Height:        Intake/Output Summary (Last 24 hours) at 06/21/2023 1201 Last data filed at 06/21/2023 0528 Gross per 24 hour  Intake 2458.82 ml  Output 550 ml  Net 1908.82 ml   Filed Weights   06/13/23 1554 06/20/23 1027  Weight: 56.2 kg 56.7 kg    Examination:  General exam: Appears calm and comfortable  Respiratory system: Clear to auscultation. Respiratory effort normal. No respiratory distress. No conversational dyspnea.  Cardiovascular system: S1 & S2 heard, irregular rhythm, tachycardic with rate 110-120. No murmurs. No pedal edema. Gastrointestinal system: Abdomen is nondistended, soft and nontender. Normal bowel sounds heard. Central nervous system: Alert and oriented. No focal neurological deficits. Speech clear.  Extremities: Symmetric in appearance  Skin: No rashes, lesions or ulcers on exposed skin  Psychiatry: Judgement and insight appear normal. Mood & affect appropriate.   Data Reviewed: I have personally reviewed following labs and imaging studies  CBC: Recent Labs  Lab 06/20/23 1101 06/20/23 2047 06/21/23 0610  WBC  --   --  14.9*  HGB 13.9 11.2* 10.8*  HCT 41.0 33.3* 32.8*  MCV  --   --  92.7  PLT  --   --  248   Basic Metabolic Panel: Recent Labs  Lab 06/20/23 1101 06/21/23 0610  NA 131* 126*  K 3.8 3.5  CL 93* 91*  CO2  --  21*  GLUCOSE 143* 161*  BUN 14 13  CREATININE 0.60 0.62  CALCIUM  --  8.7*   GFR: Estimated Creatinine Clearance: 44.4 mL/min (by C-G formula based on SCr of 0.62 mg/dL). Liver Function Tests: No results for  input(s): "AST", "ALT", "ALKPHOS", "BILITOT", "PROT", "ALBUMIN" in the last 168 hours. No results for input(s): "LIPASE", "AMYLASE" in the last 168 hours. No results for input(s): "AMMONIA" in the last 168 hours. Coagulation Profile: Recent Labs  Lab 06/21/23 0610  INR 1.1   Cardiac Enzymes: No results for input(s): "CKTOTAL", "CKMB", "CKMBINDEX", "TROPONINI" in the last 168 hours. BNP (last 3 results) No results for input(s): "PROBNP" in the last 8760 hours. HbA1C: No results for input(s): "HGBA1C" in the last 72 hours. CBG: No results for input(s): "GLUCAP" in the last 168 hours. Lipid Profile: No results for input(s): "CHOL", "HDL", "LDLCALC", "TRIG", "CHOLHDL", "LDLDIRECT" in the last 72 hours. Thyroid Function Tests: No results for input(s): "TSH", "T4TOTAL", "FREET4", "T3FREE", "THYROIDAB" in the last 72 hours. Anemia Panel: No results for input(s): "VITAMINB12", "FOLATE", "FERRITIN", "TIBC", "IRON", "RETICCTPCT" in the last 72 hours. Sepsis Labs: No results for input(s): "PROCALCITON", "LATICACIDVEN" in the last 168 hours.  No results found for this or any previous visit (from the past 240 hour(s)).    Radiology Studies: No results found.    Scheduled Meds:  atorvastatin  20 mg Oral Daily   Chlorhexidine Gluconate Cloth  6 each Topical Daily   cholecalciferol  2,000 Units Oral q AM   metoprolol succinate  150 mg Oral Daily   omeprazole  20 mg Oral Daily   polyethylene glycol  17 g Oral Daily   potassium chloride  10 mEq Oral Daily   warfarin  7.5 mg Oral ONCE-1600   Warfarin - Pharmacist Dosing Inpatient   Does not apply q1600   Continuous Infusions:  heparin 800 Units/hr (06/20/23 2203)     LOS: 1 day   Time spent: 35 minutes   Noralee Stain, DO [facility] 06/21/2023, 12:01 PM   Available via Epic secure chat 7am-7pm After these hours, please refer to coverage provider listed on [email or web address]

## 2023-06-21 NOTE — Progress Notes (Signed)
The patient arrived last night after surgery with a leaking around the foley. I tried deflating and re-inflating the balloon and advancing the catheter, but all of my attempts proved futile. NP on call gave verbal order to flush the catheter (see flowsheet). Right now, the catheter appears to be leaking less. Will carry on assessing and will notify the oncoming nurse.

## 2023-06-21 NOTE — Consult Note (Signed)
PHARMACY - ANTICOAGULATION CONSULT NOTE  Pharmacy Consult for warfarin Indication: atrial fibrillation  Allergies  Allergen Reactions   Lisinopril Cough    Patient Measurements: Height: 5\' 1"  (154.9 cm) Weight: 56.7 kg (125 lb 1.6 oz) IBW/kg (Calculated) : 47.8 Heparin Dosing Weight: 56.7 kg  Vital Signs: Temp: 98 F (36.7 C) (10/08 0526) Temp Source: Oral (10/08 0526) BP: 109/84 (10/08 0526) Pulse Rate: 106 (10/08 0526)  Labs: Recent Labs    06/20/23 1101 06/20/23 1955 06/20/23 2047 06/21/23 0610  HGB 13.9  --  11.2* 10.8*  HCT 41.0  --  33.3* 32.8*  PLT  --   --   --  248  LABPROT  --   --   --  14.2  INR  --   --   --  1.1  HEPARINUNFRC  --   --   --  0.41  CREATININE 0.60  --   --  0.62  TROPONINIHS  --  8  --   --     Estimated Creatinine Clearance: 44.4 mL/min (by C-G formula based on SCr of 0.62 mg/dL).   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   neurologist--- dr Pearlean Brownie;   11-06-2021  left frontoparietal cortical embolic infarct secondary AFib whilie off anticoagulant for emergent surgery then  post surgery 11-12-2022 on eliquis , acute right cerebellar PICA territory infarct ;  pt now on chronic coumadin, managed by clinic   Hyperlipidemia    Hypertension    Lumbar degenerative disc disease    Osteopenia    PAF (paroxysmal atrial fibrillation) (HCC) 2021   cardiologist-- dr Melburn Popper;  evaluted event monitor 03-26-2020 showed 1% burden  Afib   Presence of pessary    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 Home dose is 5 mg every day except 7.5 mg on Tuesdays. Warfarin held 5 days prior to surgery and patient bridged on 60 mg BID Lovenox. Post-op patient developed Afib with RVR and hypotension. Patient is high risk for recurrent stroke due to hx of CVA with missed anticoagulation. Patient currently being bridged with heparin, pharmacy is being consulted to restart warfarin.  10/7 No bolus, started on continuous heparin 800 units/hr   10/8 @ 0600 Heparin level = 0.41 (therapeutic) with heparin gtt @ 800 units/hr INR = 1.1 Hgb = 10.8, Pltc 248 No complications of therapy noted   Goal of Therapy:  INR 2-3 Heparin level 0.3-0.7 units/ml Monitor platelets by anticoagulation protocol: Yes   Plan:  -Continue heparin infusion at 800 units/hr -Recheck 8 hour heparin level to confirm therapeutic dose @1400  10/8 -Initiate warfarin 7.5 mg  -Check INR with AM labs and adjust dose as needed  -Monitor for new or worsening bleeding given she is post-op  Karlton Lemon, PharmD Candidate  06/21/2023,9:32 AM

## 2023-06-21 NOTE — Progress Notes (Addendum)
Throughout day, patient HR 110-120's at rest. Upon ambulation/transfer, HR in the 160-170's. No complaints of chest pain. Occasional SOB at rest, but not when moving. BP stable. MD Alvino Chapel made aware and 1x dose of IV metoprolol ordered.

## 2023-06-21 NOTE — Progress Notes (Signed)
PHARMACY - ANTICOAGULATION CONSULT NOTE  Pharmacy Consult for heparin Indication: atrial fibrillation  Allergies  Allergen Reactions   Lisinopril Cough    Patient Measurements: Height: 5\' 1"  (154.9 cm) Weight: 56.7 kg (125 lb 1.6 oz) IBW/kg (Calculated) : 47.8 Heparin Dosing Weight: 56.7 kg  Vital Signs: Temp: 98 F (36.7 C) (10/08 0526) Temp Source: Oral (10/08 0526) BP: 109/84 (10/08 0526) Pulse Rate: 106 (10/08 0526)  Labs: Recent Labs    06/20/23 1101 06/20/23 1955 06/20/23 2047 06/21/23 0610  HGB 13.9  --  11.2* 10.8*  HCT 41.0  --  33.3* 32.8*  PLT  --   --   --  248  LABPROT  --   --   --  14.2  INR  --   --   --  1.1  HEPARINUNFRC  --   --   --  0.41  CREATININE 0.60  --   --   --   TROPONINIHS  --  8  --   --     Estimated Creatinine Clearance: 44.4 mL/min (by C-G formula based on SCr of 0.6 mg/dL).   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   neurologist--- dr Pearlean Brownie;   11-06-2021  left frontoparietal cortical embolic infarct secondary AFib whilie off anticoagulant for emergent surgery then  post surgery 11-12-2022 on eliquis , acute right cerebellar PICA territory infarct ;  pt now on chronic coumadin, managed by clinic   Hyperlipidemia    Hypertension    Lumbar degenerative disc disease    Osteopenia    PAF (paroxysmal atrial fibrillation) (HCC) 2021   cardiologist-- dr Melburn Popper;  evaluted event monitor 03-26-2020 showed 1% burden  Afib   Presence of pessary    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 She is on  warfarin PTA for atrial fibrillation. Warfarin held 5 days prior to procedure and bridged with Lovenox. Plan was to continue Lovenox bridge and resume warfarin POD 1. Patient developed post-operative afib w/ RVR and hypotension. Patient with high risk of stroke (hx same with missed anticoagulation). Pharmacy consulted to manage heparin with plan to resume Lovenox bridge and warfarin once stable.  06/21/23 Heparin level = 0.41 (therapeutic) with heparin gtt @ 800 units/hr INR = 1.1 Hgb = 10.8, Pltc 248 No complications of therapy noted  Goal of Therapy:  Heparin level 0.3-0.7 units/ml Monitor platelets by anticoagulation protocol: Yes   Plan:  -No bolus -Continue heparin infusion at 800 units/hr -Recheck 8 hour heparin level to confirm therapeutic dose -Monitor for new or worsening bleeding given she is post-op -Follow for transition back to Lovenox/warfarin as indicated   Maryellen Pile, PharmD 06/21/2023 6:54 AM

## 2023-06-22 DIAGNOSIS — I5033 Acute on chronic diastolic (congestive) heart failure: Secondary | ICD-10-CM

## 2023-06-22 DIAGNOSIS — E871 Hypo-osmolality and hyponatremia: Secondary | ICD-10-CM

## 2023-06-22 DIAGNOSIS — I10 Essential (primary) hypertension: Secondary | ICD-10-CM

## 2023-06-22 DIAGNOSIS — I480 Paroxysmal atrial fibrillation: Secondary | ICD-10-CM | POA: Diagnosis not present

## 2023-06-22 DIAGNOSIS — E782 Mixed hyperlipidemia: Secondary | ICD-10-CM | POA: Diagnosis not present

## 2023-06-22 DIAGNOSIS — Z8673 Personal history of transient ischemic attack (TIA), and cerebral infarction without residual deficits: Secondary | ICD-10-CM | POA: Diagnosis not present

## 2023-06-22 DIAGNOSIS — I5030 Unspecified diastolic (congestive) heart failure: Secondary | ICD-10-CM | POA: Diagnosis present

## 2023-06-22 DIAGNOSIS — I5032 Chronic diastolic (congestive) heart failure: Secondary | ICD-10-CM | POA: Diagnosis present

## 2023-06-22 DIAGNOSIS — I4891 Unspecified atrial fibrillation: Secondary | ICD-10-CM | POA: Insufficient documentation

## 2023-06-22 LAB — HEPARIN LEVEL (UNFRACTIONATED)
Heparin Unfractionated: 0.2 [IU]/mL — ABNORMAL LOW (ref 0.30–0.70)
Heparin Unfractionated: 0.37 [IU]/mL (ref 0.30–0.70)

## 2023-06-22 LAB — CBC
HCT: 31.6 % — ABNORMAL LOW (ref 36.0–46.0)
Hemoglobin: 10.9 g/dL — ABNORMAL LOW (ref 12.0–15.0)
MCH: 31.2 pg (ref 26.0–34.0)
MCHC: 34.5 g/dL (ref 30.0–36.0)
MCV: 90.5 fL (ref 80.0–100.0)
Platelets: 219 10*3/uL (ref 150–400)
RBC: 3.49 MIL/uL — ABNORMAL LOW (ref 3.87–5.11)
RDW: 12.8 % (ref 11.5–15.5)
WBC: 14.5 10*3/uL — ABNORMAL HIGH (ref 4.0–10.5)
nRBC: 0 % (ref 0.0–0.2)

## 2023-06-22 LAB — BASIC METABOLIC PANEL
Anion gap: 8 (ref 5–15)
BUN: 10 mg/dL (ref 8–23)
CO2: 22 mmol/L (ref 22–32)
Calcium: 8.5 mg/dL — ABNORMAL LOW (ref 8.9–10.3)
Chloride: 91 mmol/L — ABNORMAL LOW (ref 98–111)
Creatinine, Ser: 0.47 mg/dL (ref 0.44–1.00)
GFR, Estimated: >60 mL/min (ref >60)
Glucose, Bld: 125 mg/dL — ABNORMAL HIGH (ref 70–99)
Potassium: 4 mmol/L (ref 3.5–5.1)
Sodium: 121 mmol/L — ABNORMAL LOW (ref 135–145)

## 2023-06-22 LAB — PROTIME-INR
INR: 1.1 (ref 0.8–1.2)
Prothrombin Time: 14.3 s (ref 11.4–15.2)

## 2023-06-22 LAB — SODIUM: Sodium: 119 mmol/L — CL (ref 135–145)

## 2023-06-22 LAB — MAGNESIUM: Magnesium: 1.3 mg/dL — ABNORMAL LOW (ref 1.7–2.4)

## 2023-06-22 LAB — SODIUM, URINE, RANDOM: Sodium, Ur: 94 mmol/L

## 2023-06-22 LAB — OSMOLALITY: Osmolality: 262 mosm/kg — ABNORMAL LOW (ref 275–295)

## 2023-06-22 LAB — BRAIN NATRIURETIC PEPTIDE: B Natriuretic Peptide: 661 pg/mL — ABNORMAL HIGH (ref 0.0–100.0)

## 2023-06-22 LAB — OSMOLALITY, URINE: Osmolality, Ur: 260 mosm/kg — ABNORMAL LOW (ref 300–900)

## 2023-06-22 MED ORDER — METOPROLOL TARTRATE 50 MG PO TABS
100.0000 mg | ORAL_TABLET | Freq: Two times a day (BID) | ORAL | Status: DC
  Administered 2023-06-22 – 2023-06-24 (×5): 100 mg via ORAL
  Filled 2023-06-22 (×5): qty 2

## 2023-06-22 MED ORDER — FUROSEMIDE 10 MG/ML IJ SOLN
20.0000 mg | Freq: Once | INTRAMUSCULAR | Status: AC
  Administered 2023-06-22: 20 mg via INTRAVENOUS
  Filled 2023-06-22: qty 2

## 2023-06-22 MED ORDER — MENTHOL 3 MG MT LOZG
1.0000 | LOZENGE | OROMUCOSAL | Status: DC | PRN
  Administered 2023-06-22: 3 mg via ORAL
  Filled 2023-06-22: qty 9

## 2023-06-22 MED ORDER — LORATADINE 10 MG PO TABS
10.0000 mg | ORAL_TABLET | Freq: Every day | ORAL | Status: DC
  Administered 2023-06-22 – 2023-06-24 (×3): 10 mg via ORAL
  Filled 2023-06-22 (×3): qty 1

## 2023-06-22 MED ORDER — WARFARIN SODIUM 5 MG PO TABS
7.5000 mg | ORAL_TABLET | Freq: Once | ORAL | Status: AC
  Administered 2023-06-22: 7.5 mg via ORAL
  Filled 2023-06-22: qty 1

## 2023-06-22 MED ORDER — MAGNESIUM SULFATE 4 GM/100ML IV SOLN
4.0000 g | Freq: Once | INTRAVENOUS | Status: AC
  Administered 2023-06-22: 4 g via INTRAVENOUS
  Filled 2023-06-22: qty 100

## 2023-06-22 MED ORDER — MAGNESIUM SULFATE 2 GM/50ML IV SOLN
2.0000 g | Freq: Once | INTRAVENOUS | Status: DC
  Filled 2023-06-22: qty 50

## 2023-06-22 MED ORDER — DILTIAZEM HCL 30 MG PO TABS
30.0000 mg | ORAL_TABLET | Freq: Four times a day (QID) | ORAL | Status: DC
  Administered 2023-06-22 – 2023-06-23 (×4): 30 mg via ORAL
  Filled 2023-06-22 (×5): qty 1

## 2023-06-22 MED ORDER — BENZONATATE 100 MG PO CAPS
100.0000 mg | ORAL_CAPSULE | Freq: Three times a day (TID) | ORAL | Status: DC
  Administered 2023-06-23 – 2023-06-24 (×5): 100 mg via ORAL
  Filled 2023-06-22 (×5): qty 1

## 2023-06-22 NOTE — Consult Note (Signed)
PHARMACY - ANTICOAGULATION CONSULT NOTE  Pharmacy Consult for warfarin Indication: atrial fibrillation  Allergies  Allergen Reactions   Lisinopril Cough    Patient Measurements: Height: 5\' 1"  (154.9 cm) Weight: 56.7 kg (125 lb 1.6 oz) IBW/kg (Calculated) : 47.8 Heparin Dosing Weight: 56.7 kg  Vital Signs: Temp: 98.4 F (36.9 C) (10/09 0327) Temp Source: Oral (10/09 0327) BP: 120/100 (10/09 0327) Pulse Rate: 125 (10/09 0327)  Labs: Recent Labs    06/20/23 1101 06/20/23 1955 06/20/23 2047 06/21/23 0610 06/21/23 1414 06/22/23 0409  HGB 13.9  --  11.2* 10.8*  --  10.9*  HCT 41.0  --  33.3* 32.8*  --  31.6*  PLT  --   --   --  248  --  219  LABPROT  --   --   --  14.2  --   --   INR  --   --   --  1.1  --   --   HEPARINUNFRC  --   --   --  0.41 0.30 0.20*  CREATININE 0.60  --   --  0.62  --  0.47  TROPONINIHS  --  8  --   --   --   --     Estimated Creatinine Clearance: 44.4 mL/min (by C-G formula based on SCr of 0.47 mg/dL).   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   neurologist--- dr Pearlean Brownie;   11-06-2021  left frontoparietal cortical embolic infarct secondary AFib whilie off anticoagulant for emergent surgery then  post surgery 11-12-2022 on eliquis , acute right cerebellar PICA territory infarct ;  pt now on chronic coumadin, managed by clinic   Hyperlipidemia    Hypertension    Lumbar degenerative disc disease    Osteopenia    PAF (paroxysmal atrial fibrillation) (HCC) 2021   cardiologist-- dr Melburn Popper;  evaluted event monitor 03-26-2020 showed 1% burden  Afib   Presence of pessary    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 Home dose is 5 mg every day except 7.5 mg on Tuesdays. Warfarin held 5 days prior to surgery and patient bridged on 60 mg BID Lovenox. Post-op patient developed Afib with RVR and hypotension. Patient is high risk for recurrent stroke due to hx of CVA with missed anticoagulation. Patient currently being bridged with heparin, pharmacy is being consulted to restart warfarin.  10/7 No bolus, started on continuous heparin 800 units/hr   06/22/23 Heparin level = 0.2 (subtherapeutic) with heparin gtt @ 800 units/hr INR = in progress Hgb = 10.9, Pltc 219 No complications of therapy noted   Goal of Therapy:  INR 2-3 Heparin level 0.3-0.7 units/ml Monitor platelets by anticoagulation protocol: Yes   Plan:  -Increase heparin infusion to 900 units/hr - Check heparin level 8 hr after rate increase - f/u pending PT/INR and dose warfarin accordingly - [facility] with AM labs  -Monitor for new or worsening bleeding given she is post-op  Terrilee Files, PharmD 06/22/2023 4:44 AM

## 2023-06-22 NOTE — Progress Notes (Signed)
[facility] Progress Note  Subjective: POD#2 s/p LeForte Colpocleisis, levator plication, perineorrhaphy, cystoscopy, excision of urethral prolapse. Admitted for rate control of atrial fibrillation.   Still feeling well but HR increases with ambulation so has not ambulated much. Denies pain. Tolerating regular diet. Has had two bowel movement. Foley catheter in place, not much leakage around.   Objective: Vitals:   06/22/23 0327 06/22/23 0826  BP: (!) 120/100 (!) 126/98  Pulse: (!) 125 (!) 55  Resp:  16  Temp: 98.4 F (36.9 C) 98.7 F (37.1 C)  SpO2: 98% 94%     General: alert and cooperative Abdomen: soft, nontender Extremities: extremities with bilateral edema Vaginal Bleeding: minimal  Lab Results  Component Value Date   WBC 14.5 (H) 06/22/2023   HGB 10.9 (L) 06/22/2023   HCT 31.6 (L) 06/22/2023   MCV 90.5 06/22/2023   PLT 219 06/22/2023   Lab Results  Component Value Date   NA 121 (L) 06/22/2023   K 4.0 06/22/2023   CL 91 (L) 06/22/2023   CO2 22 06/22/2023    Assessment/Plan:  - Discussed changing Hospitalist team to primary due to continued A.fib and chronic cardiac issues. Appreciate management. Further decline in Na level and increased LE edema suggests volume overload- has had some maintenance fluids with heparin gtt. Will defer further management to hospitalist team.  - Healing well post-operatively, no current concerns - regular diet - ambulate as tolerate, SCDs in bed - pain control with PO meds prn- tylenol or oxycodone - Keep foley due to removal of urethral prolapse. Will keep on discharge and remove next week in office. Can size up if needed due to leakage.   Will continue to follow   LOS: 2 days    Marguerita Beards, MD 06/22/2023, 8:41 AM

## 2023-06-22 NOTE — Plan of Care (Signed)
  Problem: Education: Goal: Knowledge of the prescribed therapeutic regimen will improve Outcome: Progressing   

## 2023-06-22 NOTE — Consult Note (Signed)
PHARMACY - ANTICOAGULATION CONSULT NOTE  Pharmacy Consult for warfarin Indication: atrial fibrillation  Allergies  Allergen Reactions   Lisinopril Cough    Patient Measurements: Height: 5\' 1"  (154.9 cm) Weight: 56.7 kg (125 lb 1.6 oz) IBW/kg (Calculated) : 47.8 Heparin Dosing Weight: 56.7 kg  Vital Signs: Temp: 97.8 F (36.6 C) (10/09 1409) Temp Source: Oral (10/09 1409) BP: 117/85 (10/09 1409) Pulse Rate: 88 (10/09 1409)  Labs: Recent Labs    06/20/23 1101 06/20/23 1955 06/20/23 2047 06/20/23 2047 06/21/23 0610 06/21/23 1414 06/22/23 0409 06/22/23 1255  HGB 13.9  --  11.2*  --  10.8*  --  10.9*  --   HCT 41.0  --  33.3*  --  32.8*  --  31.6*  --   PLT  --   --   --   --  248  --  219  --   LABPROT  --   --   --   --  14.2  --  14.3  --   INR  --   --   --   --  1.1  --  1.1  --   HEPARINUNFRC  --   --   --    < > 0.41 0.30 0.20* 0.37  CREATININE 0.60  --   --   --  0.62  --  0.47  --   TROPONINIHS  --  8  --   --   --   --   --   --    < > = values in this interval not displayed.    Estimated Creatinine Clearance: 44.4 mL/min (by C-G formula based on SCr of 0.47 mg/dL).   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   neurologist--- dr Pearlean Brownie;   11-06-2021  left frontoparietal cortical embolic infarct secondary AFib whilie off anticoagulant for emergent surgery then  post surgery 11-12-2022 on eliquis , acute right cerebellar PICA territory infarct ;  pt now on chronic coumadin, managed by clinic   Hyperlipidemia    Hypertension    Lumbar degenerative disc disease    Osteopenia    PAF (paroxysmal atrial fibrillation) (HCC) 2021   cardiologist-- dr Melburn Popper;  evaluted event monitor 03-26-2020 showed 1% burden  Afib   Presence of  pessary    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 Home dose is 5 mg every day except 7.5 mg on Tuesdays. Warfarin held 5 days prior to surgery and patient bridged on 60 mg BID Lovenox. Post-op patient developed Afib with RVR and hypotension. Patient is high risk for recurrent stroke due to hx of CVA with missed anticoagulation. Patient currently being bridged with heparin, pharmacy is being consulted to restart warfarin.  06/20/23 No bolus, started on continuous heparin 800 units/hr   06/22/23 @0500  - heparin level was 0.2 (subtherapeutic), so increased to 900 units/hr   @1300  - heparin level 0.37 (therapeutic) with heparin gtt @ 900 units/hr  -  no complications of therapy noted  - INR 1.1 - Hgb 10.9, Pltc 219  Goal of Therapy:  INR 2-3 Heparin level 0.3-0.7 units/ml Monitor platelets by anticoagulation protocol: Yes   Plan:  - continue heparin infusion at 900 units/hr - daily heparin level, CBC, and INR with AM labs  - repeat warfarin 7.5 mg - Monitor for new or worsening bleeding given she is post-op   Karlton Lemon, PharmD Candidate 06/22/2023 2:10 PM

## 2023-06-22 NOTE — Consult Note (Addendum)
Cardiology Consultation   Patient ID: KEIRAH JORDON MRN: [id number]; DOB: October 04, 1945  Admit date: 06/20/2023 Date of Consult: 06/22/2023  PCP:  [name], MD   [facility] Providers Cardiologist:  Kristeen Miss, MD  Cardiology APP:  Beatrice Lecher, PA-C  {  Patient Profile:   Hannah Downs is a 77 y.o. 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 Florian Buff.  History of Present Illness:   Hannah Downs is a 76 year old female with the above history who is followed by Dr. Elease Hashimoto primarily for atrial fibrillation. She had an acute CVA after Eliquis was held for an emergent abdominal surgery. Echo at that time showed LVEF of 60-65% with normal wall motion, grade 2 diastolic dysfunction, and no significant valvular disease. She was resumed on Eliquis but presented with another CVA in 11/2021. She was switched to Coumadin at that time with plans for Lovenox bridge for any additional surgical procedures in the future.. Patient was last seen by Robin Searing, NP, in 12/2022 at which times she was doing well from a cardiac standpoint.   Patient presented to [facility] on 06/20/2023 for planned LeForte colpocleisis, levator plication, perineorrhaphy, cystoscopy, and excision of urethral prolapse for stage IV uretovaginal prolapse. She tolerated the procedure well with no immediate complications but then was noted to go into rapid atrial fibrillation post-operatively. Internal Medicine was consulted to assist with rate control and she was restarted on home Toprol-XL. Cardiology now consulted for assistance.   At the time of this evaluation, she remains in atrial fibrillation with rates in the 130s to 150s. She denies any  palpitation/ heart racing with this but does reported occasional minimal lightheadedness/ dizziness as well as mild shortness of breath and intermittent chest heaviness when in RVR that feels like a "baby is sitting on her." She describes the shortness of breath as feeling "winded" when walking short distances such as to the bathroom and back. This is not entirely new. She states that about 3 weeks ago, she noticed she was also a little winded when walking to the mailbox and back but did not think much of this. However, this is occurring with much shorter distances now. No orthopnea or PND. She notes some mild lower extremity edema following surgery from all the IV fluids she has been getting. No syncope. No has a chronic cough which she states she has had for a long time but denies any recent fevers or illness. No GI symptoms. No abnormal bleeding in urine or stools. She does state that she was compliant with her Lovenox bridge prior to surgery.    Past Medical History:  Diagnosis Date   Anticoagulated on Coumadin    managed by coumadin clinic   Arthritis    Dyspnea    exertion   GERD (gastroesophageal reflux disease)    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   neurologist--- dr Pearlean Brownie;   11-06-2021  left frontoparietal cortical embolic infarct secondary AFib whilie off anticoagulant for emergent surgery then  post surgery 11-12-2022 on eliquis , acute right cerebellar PICA territory infarct ;  pt now on chronic coumadin, managed by clinic   Hyperlipidemia    Hypertension    Lumbar degenerative disc disease    Osteopenia  PAF (paroxysmal atrial fibrillation) [facility]) 2021   cardiologist-- dr Melburn Popper;  evaluted event monitor 03-26-2020 showed 1% burden  Afib   Presence of pessary    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   Seasonal allergies    Varicose vein    right leg   Vitamin D deficiency    Wears glasses      Past Surgical History:  Procedure Laterality Date   COLPOCLEISIS N/A 06/20/2023   Procedure: LEFORTE COLPOCLEISIS; LEVATOR PLICATION WITH PERINEORRHAPHY;  Surgeon: Marguerita Beards, MD;  Location: [facility];  Service: Gynecology;  Laterality: N/A;   CYSTOSCOPY N/A 06/20/2023   Procedure: CYSTOSCOPY;  Surgeon: Marguerita Beards, MD;  Location: [facility];  Service: Gynecology;  Laterality: N/A;   DILATION AND CURETTAGE OF UTERUS  1998   FEMORAL HERNIA REPAIR Left 11/02/2006   @[facility]   by dr Grier Mitts;   Emergent Inguinal exploratory & primary repair femoral hernia and Reduction of small bowel obstruction   INGUINAL HERNIA REPAIR Left 02/03/2007   @[facility]  by dr Grier Mitts;   Open recurrent repair [facility] w/ mesh   INGUINAL HERNIA REPAIR N/A 11/04/2021   Procedure: lysis of adhesions reduction of incarcerated recurrent left femoral hernia laparoscopic repair. laparoscopic right femoral hernia repair and right obturator repair with mesh. tap block;  Surgeon: Karie Soda, MD;  Location: [facility];  Service: General;  Laterality: N/A;   ROBOTIC ASSISTED DIAGNOSTIC LAPAROSCOPY  09/18/2021   @[facility]  by dr Judie Petit. Daphine Deutscher;   ATTEMPTED  reduction recurrent inguinal hernia,  aborted due to involvement of bladder and possible vagina   TOTAL HIP ARTHROPLASTY Right 06/26/2014   Procedure: RIGHT TOTAL HIP ARTHROPLASTY ANTERIOR APPROACH WITH ACETABULAR AUTOGRAFT;  Surgeon: Loanne Drilling, MD;  Location: [facility];  Service: Orthopedics;  Laterality: Right;       Inpatient Medications: Scheduled Meds:  atorvastatin  20 mg Oral Daily   Chlorhexidine Gluconate Cloth  6 each Topical Daily   cholecalciferol  2,000 Units Oral q AM   loratadine  10 mg Oral Daily   metoprolol tartrate  100 mg Oral BID   omeprazole  20 mg Oral Daily   polyethylene glycol  17 g Oral Daily   potassium chloride  10 mEq Oral Daily   warfarin  7.5 mg Oral ONCE-1600   Warfarin - Pharmacist Dosing Inpatient    Does not apply q1600   Continuous Infusions:  heparin 900 Units/hr (06/22/23 0524)   magnesium sulfate bolus IVPB Stopped (06/22/23 0946)   magnesium sulfate bolus IVPB     PRN Meds: acetaminophen, guaiFENesin, ondansetron **OR** ondansetron (ZOFRAN) IV, simethicone, traMADol  Allergies:    Allergies  Allergen Reactions   Lisinopril Cough    Social History:   Social History   Socioeconomic History   Marital status: Married    Spouse name: Not on file   Number of children: Not on file   Years of education: Not on file   Highest education level: Not on file  Occupational History   Not on file  Tobacco Use   Smoking status: Never   Smokeless tobacco: Never  Vaping Use   Vaping status: Never Used  Substance and Sexual Activity   Alcohol use: No   Drug use: Never   Sexual activity: Not Currently    Birth control/protection: Post-menopausal  Other Topics Concern   Not on file  Social History Narrative   Not on file   Social Determinants of  Health   Financial Resource Strain: Not on file  Food Insecurity: No Food Insecurity (06/20/2023)   Hunger Vital Sign    Worried About Running Out of Food in the Last Year: Never true    Ran Out of Food in the Last Year: Never true  Transportation Needs: No Transportation Needs (06/20/2023)   PRAPARE - Administrator, Civil Service (Medical): No    Lack of Transportation (Non-Medical): No  Physical Activity: Not on file  Stress: Not on file  Social Connections: Not on file  Intimate Partner Violence: Not At Risk (06/20/2023)   Humiliation, Afraid, Rape, and Kick questionnaire    Fear of Current or Ex-Partner: No    Emotionally Abused: No    Physically Abused: No    Sexually Abused: No    Family History:   Family History  Problem Relation Age of Onset   Cancer Mother    Stroke Father      ROS:  Please see the history of present illness.    Physical Exam/Data:   Vitals:   06/21/23 1746 06/21/23 2027  06/22/23 0327 06/22/23 0826  BP: 117/81 120/79 (!) 120/100 (!) 126/98  Pulse:  (!) 124 (!) 125 (!) 55  Resp: 20 18  16   Temp:  98.4 F (36.9 C) 98.4 F (36.9 C) 98.7 F (37.1 C)  TempSrc:  Oral Oral Oral  SpO2:  96% 98% 94%  Weight:      Height:        Intake/Output Summary (Last 24 hours) at 06/22/2023 0953 Last data filed at 06/22/2023 0230 Gross per 24 hour  Intake 76.23 ml  Output 450 ml  Net -373.77 ml      06/20/2023   10:27 AM 06/13/2023    3:54 PM 01/05/2023    1:29 PM  Last 3 Weights  Weight (lbs) 125 lb 1.6 oz 124 lb 122 lb 12.8 oz  Weight (kg) 56.745 kg 56.246 kg 55.702 kg     Body mass index is 23.64 kg/m.  General: 77 y.o. Caucasian female resting comfortably in no acute distress. HEENT: Normocephalic and atraumatic. Sclera clear.  Neck: Supple. Distended/ plump neck veins. Heart: Tachycardic with irregularly irregular rhythm.  Lungs: No increased work of breathing. Clear to ausculation bilaterally. No wheezes, rhonchi, or rales.  Abdomen: Soft, non-distended, and non-tender to palpation. Extremities: Minimal lower extremity edema bilaterally.   Skin: Warm and dry. Neuro: Alert and oriented x3. No focal deficits. Psych: Normal affect. Responds appropriately.   EKG:  The EKG was personally reviewed and demonstrates:  Atrial fibrillation, rate 118 bpm, with T wave inversions in leads III and aVF but no acute ischemic changes compared to prior tracings.  Telemetry:  Telemetry was personally reviewed and demonstrates:  Atrial fibrillation with rates ranging from the 130s to 150s.  Relevant CV Studies:  Echocardiogram 11/08/2021: Impressions: 1. Technically difficult; no parasternal views.   2. Left ventricular ejection fraction, by estimation, is 60 to 65%. The  left ventricle has normal function. The left ventricle has no regional  wall motion abnormalities. Left ventricular diastolic parameters are  consistent with Grade II diastolic  dysfunction  (pseudonormalization).   3. Right ventricular systolic function is normal. The right ventricular  size is normal.   4. Left atrial size was mildly dilated.   5. Right atrial size was mildly dilated.   6. The mitral valve is normal in structure. Trivial mitral valve  regurgitation. No evidence of mitral stenosis.   7. The aortic  valve was not assessed. Aortic valve regurgitation is not  visualized. No aortic stenosis is present.   8. Pulmonic valve regurgitation Not assessed.   9. The inferior vena cava is normal in size with greater than 50%  respiratory variability, suggesting right atrial pressure of 3 mmHg.    Laboratory Data:  High Sensitivity Troponin:   Recent Labs  Lab 06/20/23 1955  TROPONINIHS 8     Chemistry Recent Labs  Lab 06/20/23 1101 06/21/23 0610 06/22/23 0409  NA 131* 126* 121*  K 3.8 3.5 4.0  CL 93* 91* 91*  CO2  --  21* 22  GLUCOSE 143* 161* 125*  BUN 14 13 10   CREATININE 0.60 0.62 0.47  CALCIUM  --  8.7* 8.5*  MG  --   --  1.3*  GFRNONAA  --  >60 >60  ANIONGAP  --  14 8    No results for input(s): "PROT", "ALBUMIN", "AST", "ALT", "ALKPHOS", "BILITOT" in the last 168 hours. Lipids No results for input(s): "CHOL", "TRIG", "HDL", "LABVLDL", "LDLCALC", "CHOLHDL" in the last 168 hours.  Hematology Recent Labs  Lab 06/20/23 2047 06/21/23 0610 06/22/23 0409  WBC  --  14.9* 14.5*  RBC  --  3.54* 3.49*  HGB 11.2* 10.8* 10.9*  HCT 33.3* 32.8* 31.6*  MCV  --  92.7 90.5  MCH  --  30.5 31.2  MCHC  --  32.9 34.5  RDW  --  12.8 12.8  PLT  --  248 219   Thyroid No results for input(s): "TSH", "FREET4" in the last 168 hours.  BNPNo results for input(s): "BNP", "PROBNP" in the last 168 hours.  DDimer No results for input(s): "DDIMER" in the last 168 hours.   Radiology/Studies:  No results found.   Assessment and Plan:   Paroxysmal Atrial Fibrillation Patient presented for planned surgery for stage IV ureterovaginal prolapse. Procedure went well  but she went into post-op atrial fibrillation with RVR post-operatively.  - Remains in atrial fibrillation with rates in the 130s to 150s. She reports some associated intermittent chest heaviness and mild dyspnea on exertion with this. - Consider repeat Echo when rate is better controlled. LVEF normal in 10/2021. - Magnesium 1.3 today. Will replete.  - Potassium 4.0 today. - High-sensitivity troponin negative.  - Will check a BNP. She notes some dyspnea on exertion and mild edema after IV fluids. Worry she may end up with some degree of diastolic CHF after perioperative fluids and rapid atrial fibrillation. - On Toprol-XL 150mg  daily at home. Will switch to Lopressor 100mg  twice daily. - Will add short acting Diltaizem 30mg  every 6 hours as well. Discussed started IV Diltiazem but she preferred using oral medications. BP is often soft at times with systolic BP in the high 90s overnight so I would worry that she would not tolerate the IV Diltiazem well.  - We may ultimately need Amiodarone.  - She is on anticoagulation with Coumadin. She has a history of acute CVA in 10/2021 in setting of Eliquis being held for emergency surgery and then a recurrent CVA in 11/2021 while on Eliquis. She has been on Coumadin since then. She reports compliance with Lovenox bridge prior to surgery. She has been restarted on Coumadin with IV Heparin bridge. INR 1.1 today. Dosing per Pharmacy  Hypertension She has a history of hypertension but had some hypotension after surgery requiring IV fluids. Diastolic BP now elevated at times but overall BP well controlled.  - Continue Toprol-XL as above. - Home  HCTZ was held due to hypertension after surgery. Continue to hold this to allow for more rate control  Hyperlipidemia - Continue home Lipitor 20mg  daily.  Otherwise, per primary team or [facility]: - Ureterovaginal prolapse - Hyponatremia - GERD - History of recurrent CVA   Risk Assessment/Risk Scores:    CHA2DS2-VASc  Score = 7  This indicates a 11.2% annual risk of stroke. The patient's score is based upon: CHF History: 0 HTN History: 1 Diabetes History: 0 Stroke History: 2 Vascular Disease History: 1 Age Score: 2 Gender Score: 1     For questions or updates, please contact [facility] Please consult [email or web address] for contact info under    Signed, Hannah Parker, PA-C  06/22/2023 9:53 AM  Personally seen and examined. Agree with APP above with the following comments:  25-year-old patient with a history of paroxysmal atrial fibrillation, hypertension, hyperlipidemia, aortic atherosclerosis, and embolic stroke in 2022 and 2023, recently underwent a pelvic organ prolapse repair. She sees Dr. Elease Hashimoto; I see her husband for apical HCM. Postoperatively, she developed atrial fibrillation with a rapid ventricular response. The patient's atrial fibrillation is paroxysmal in nature, and she was confirmed to be in sinus rhythm in January 2024. Despite being on DOAC therapy, the patient had a stroke in 2023, leading to a switch to Coumadin. The patient is mildly symptomatic with atrial fibrillation, noting chest discomfort and feeling winded. However, her feelings of dyspnea on exertion and fluttering. She also has lower extremity edema, which worsened after receiving IV fluids post-procedure.  She notes improvement since she has been started on AV nodal therapy. She has not ambulated since.  Exam notable for VITALS: P- 90s CARDIOVASCULAR: irregularly irregular rhythm. JVD noted EXTREMITIES: Trace bilateral  lower pitting edema.  Labs notable for  Mg 1.3; K 4.0, Na 121 Hgb 13.9-> 10.9 post procedure RADIOLOGY Abdominal pelvis CT: Aortic atherosclerosis, peripheral artery calcifications  DIAGNOSTIC EKG: Sinus rhythm (09/2022) TEE: Rate control AF on new medications Echocardiogram: Normal biventricular function, RV dilation, biatrial dilation (2023)   Would recommend   Paroxysmal Atrial  Fibrillation - Postoperative atrial fibrillation with rapid ventricular response following pelvic organ prolapse repair. Mildly symptomatic with chest discomfort and dyspnea. EKG shows atrial fibrillation with rapid ventricular response and inferior T wave inversions. Telemetry shows elevated rates averaging 130 beats per minute. -Continue Metoprolol  -Add low dose Diltiazem PO. As above - we offered TEE/DCCV and she has declined; I've asked her to ambulate today, if rates are controlled, no escalation, otherwise we will start PO amiodarone  Volume Overload Acute on chronic hyponatremia - Lower extremity edema worsened after IV fluid administration postoperatively. BNP pending. -Stop further IV fluid resuscitation. - BNP 660s; she is lasix naive given 20 IV lasix X 1  Anticoagulation History of embolic stroke in 2022 and 2023 while on DOAC therapy. Currently on heparin pending INR normalization  Aortic Atherosclerosis - Noted on prior abdominal pelvis CT scans. - continue statin  Family History of Apical Hypertrophic Cardiomyopathy - Husband diagnosed, children yet to be tested.; there is no HCM in her biological family  Riley Lam, MD FASE [facility] Cardiologist [facility]  [address] [place], [address] [place], [place] [address] [phone]  12:09 PM

## 2023-06-22 NOTE — Progress Notes (Signed)
[facility]                                                                              Hannah Downs, is a 77 y.o. female, DOB - [birth year], ZOX:[id number] Admit date - 06/20/2023    Outpatient Primary MD for the patient is [name], MD  LOS - 2  days  No chief complaint on file.      Brief summary   Patient is a 77 year old female with history of paroxysmal atrial fibrillation, recurrent CVA, on Coumadin who underwent pelvic organ prolapse repair on 10/7.  Postoperatively developed atrial fibrillation with RVR, hypotension and [facility] was consulted for medical management.   Assessment & Plan    Principal Problem:   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 organs -Management per GYN   Active Problems:   Paroxysmal atrial fibrillation with RVR (HCC) -On exam this morning, noted to have uncontrolled heart rate 140's, also reporting dyspnea with minimal exertion, chest heaviness -Consulted cardiology, outpatient follows Dr Elease Hashimoto, will likely need amiodarone -Appreciate cardiology for evaluation, switch to Lopressor 100 mg twice daily, added Cardizem received Lasix 20 mg IV x 1 -On IV heparin drip, patient declined TEE/DCCV  Acute diastolic CHF -Patient reporting chest heaviness, dyspnea on minimal exertion, likely respiratory due to paroxysmal A-fib with RVR -BNP 661, received Lasix 20 mg IV x 1     Hx of stroke without residual deficits -History of prior embolic strokes in 2022 in 2023 while on DOAC -Currently on IV heparin drip  Acute on chronic hyponatremia -Likely due to volume overload with lower extremity edema, DOE.  Sodium 134 on 11/05/2022, presented with NA 131--> 121 today -Received Lasix 20 mg IV x 1, will recheck bmet -Obtain serum osmolality, urine osmolality, UNA    Essential hypertension -BP currently stable     Hyperlipidemia -Continue statin  Estimated body mass index is 23.64 kg/m as calculated from the following:   Height as of this  encounter: 5\' 1"  (1.549 m).   Weight as of this encounter: 56.7 kg.  Code Status: Full code DVT Prophylaxis:  SCDs Start: 06/20/23 1959 warfarin (COUMADIN) tablet 7.5 mg   Level of Care: Level of care: Telemetry Family Communication: Updated patient Disposition Plan:      Remains inpatient appropriate:      Procedures:  LeForte Colpocleisis, levator plication, perineorrhaphy, cystoscopy, excision of urethral prolapse   Consultants:   Admitted by GYN Cardiology  Antimicrobials:   Anti-infectives (From admission, onward)    Start     Dose/Rate Route Frequency Ordered Stop   06/20/23 1030  ceFAZolin (ANCEF) IVPB 2g/100 mL premix        2 g 200 mL/hr over 30 Minutes Intravenous On call to O.R. 06/20/23 1018 06/20/23 1326          Medications  atorvastatin  20 mg Oral Daily   Chlorhexidine Gluconate Cloth  6 each Topical Daily   cholecalciferol  2,000 Units Oral q AM   diltiazem  30 mg Oral Q6H   loratadine  10 mg Oral Daily   metoprolol tartrate  100 mg Oral BID   omeprazole  20 mg Oral Daily  polyethylene glycol  17 g Oral Daily   potassium chloride  10 mEq Oral Daily   warfarin  7.5 mg Oral ONCE-1600   Warfarin - Pharmacist Dosing Inpatient   Does not apply q1600      Subjective:   Hannah Downs was seen and examined today.  Seen this morning, noted to be in atrial fibrillation with RVR, heart rate in 140s.  Reported chest heaviness and dyspnea on exertion.  No nausea vomiting, dizziness, lightheadedness.  No fevers.  Objective:   Vitals:   06/21/23 1746 06/21/23 2027 06/22/23 0327 06/22/23 0826  BP: 117/81 120/79 (!) 120/100 (!) 126/98  Pulse:  (!) 124 (!) 125 (!) 55  Resp: 20 18  16   Temp:  98.4 F (36.9 C) 98.4 F (36.9 C) 98.7 F (37.1 C)  TempSrc:  Oral Oral Oral  SpO2:  96% 98% 94%  Weight:      Height:        Intake/Output Summary (Last 24 hours) at 06/22/2023 1304 Last data filed at 06/22/2023 0900 Gross per 24 hour  Intake 196.23 ml   Output 450 ml  Net -253.77 ml     Wt Readings from Last 3 Encounters:  06/20/23 56.7 kg  01/05/23 55.7 kg  10/05/22 54.5 kg     Exam General: Alert and oriented x 3, NAD Cardiovascular: Irregularly regular, tachycardia Respiratory: Diminished breath sound at the bases, no wheezing Gastrointestinal: Soft, nontender, nondistended, + bowel sounds Ext: 1+ pedal edema bilaterally Neuro: Strength 5/5 upper and lower extremities bilaterally Skin: No rashes Psych: Normal affect     Data Reviewed:  I have personally reviewed following labs    CBC Lab Results  Component Value Date   WBC 14.5 (H) 06/22/2023   RBC 3.49 (L) 06/22/2023   HGB 10.9 (L) 06/22/2023   HCT 31.6 (L) 06/22/2023   MCV 90.5 06/22/2023   MCH 31.2 06/22/2023   PLT 219 06/22/2023   MCHC 34.5 06/22/2023   RDW 12.8 06/22/2023   LYMPHSABS 0.9 11/11/2021   MONOABS 0.7 11/11/2021   EOSABS 0.0 11/11/2021   BASOSABS 0.0 11/11/2021     Last metabolic panel Lab Results  Component Value Date   NA 121 (L) 06/22/2023   K 4.0 06/22/2023   CL 91 (L) 06/22/2023   CO2 22 06/22/2023   BUN 10 06/22/2023   CREATININE 0.47 06/22/2023   GLUCOSE 125 (H) 06/22/2023   GFRNONAA >60 06/22/2023   GFRAA >90 06/27/2014   CALCIUM 8.5 (L) 06/22/2023   PHOS 3.4 11/06/2021   PROT 5.8 (L) 11/12/2021   ALBUMIN 3.1 (L) 11/12/2021   BILITOT 1.2 11/12/2021   ALKPHOS 56 11/12/2021   AST 84 (H) 11/12/2021   ALT 77 (H) 11/12/2021   ANIONGAP 8 06/22/2023    CBG (last 3)  No results for input(s): "GLUCAP" in the last 72 hours.    Coagulation Profile: Recent Labs  Lab 06/21/23 0610 06/22/23 0409  INR 1.1 1.1     Radiology Studies: I have personally reviewed the imaging studies  No results found.     Thad Ranger M.D. [facility] 06/22/2023, 1:04 PM  Available via Epic secure chat 7am-7pm After 7 pm, please refer to night coverage provider listed on amion.

## 2023-06-23 ENCOUNTER — Encounter (HOSPITAL_COMMUNITY)
Admission: AD | Disposition: A | Discharge status: Home / Self Care | Payer: Self-pay | Source: Home / Self Care | Attending: Internal Medicine

## 2023-06-23 ENCOUNTER — Inpatient Hospital Stay (HOSPITAL_COMMUNITY): Payer: Medicare Other

## 2023-06-23 DIAGNOSIS — I5031 Acute diastolic (congestive) heart failure: Secondary | ICD-10-CM | POA: Diagnosis not present

## 2023-06-23 DIAGNOSIS — I480 Paroxysmal atrial fibrillation: Secondary | ICD-10-CM | POA: Diagnosis not present

## 2023-06-23 DIAGNOSIS — N814 Uterovaginal prolapse, unspecified: Secondary | ICD-10-CM | POA: Diagnosis not present

## 2023-06-23 DIAGNOSIS — I10 Essential (primary) hypertension: Secondary | ICD-10-CM | POA: Diagnosis not present

## 2023-06-23 DIAGNOSIS — Z8673 Personal history of transient ischemic attack (TIA), and cerebral infarction without residual deficits: Secondary | ICD-10-CM | POA: Diagnosis not present

## 2023-06-23 LAB — BASIC METABOLIC PANEL
Anion gap: 10 (ref 5–15)
Anion gap: 9 (ref 5–15)
BUN: 10 mg/dL (ref 8–23)
BUN: 9 mg/dL (ref 8–23)
CO2: 23 mmol/L (ref 22–32)
CO2: 24 mmol/L (ref 22–32)
Calcium: 8.5 mg/dL — ABNORMAL LOW (ref 8.9–10.3)
Calcium: 8.1 mg/dL — ABNORMAL LOW (ref 8.9–10.3)
Chloride: 87 mmol/L — ABNORMAL LOW (ref 98–111)
Chloride: 88 mmol/L — ABNORMAL LOW (ref 98–111)
Creatinine, Ser: 0.48 mg/dL (ref 0.44–1.00)
Creatinine, Ser: 0.43 mg/dL — ABNORMAL LOW (ref 0.44–1.00)
GFR, Estimated: >60 mL/min (ref >60)
GFR, Estimated: >60 mL/min (ref >60)
Glucose, Bld: 142 mg/dL — ABNORMAL HIGH (ref 70–99)
Glucose, Bld: 113 mg/dL — ABNORMAL HIGH (ref 70–99)
Potassium: 4.1 mmol/L (ref 3.5–5.1)
Potassium: 3.6 mmol/L (ref 3.5–5.1)
Sodium: 120 mmol/L — ABNORMAL LOW (ref 135–145)
Sodium: 121 mmol/L — ABNORMAL LOW (ref 135–145)

## 2023-06-23 LAB — CBC
HCT: 29.6 % — ABNORMAL LOW (ref 36.0–46.0)
Hemoglobin: 10.4 g/dL — ABNORMAL LOW (ref 12.0–15.0)
MCH: 31 pg (ref 26.0–34.0)
MCHC: 35.1 g/dL (ref 30.0–36.0)
MCV: 88.4 fL (ref 80.0–100.0)
Platelets: 218 10*3/uL (ref 150–400)
RBC: 3.35 MIL/uL — ABNORMAL LOW (ref 3.87–5.11)
RDW: 12.7 % (ref 11.5–15.5)
WBC: 11.5 10*3/uL — ABNORMAL HIGH (ref 4.0–10.5)
nRBC: 0 % (ref 0.0–0.2)

## 2023-06-23 LAB — HEPATIC FUNCTION PANEL
ALT: 35 U/L (ref 0–44)
AST: 26 U/L (ref 15–41)
Albumin: 3.6 g/dL (ref 3.5–5.0)
Alkaline Phosphatase: 56 U/L (ref 38–126)
Bilirubin, Direct: 0.4 mg/dL — ABNORMAL HIGH (ref 0.0–0.2)
Indirect Bilirubin: 1.6 mg/dL — ABNORMAL HIGH (ref 0.3–0.9)
Total Bilirubin: 2 mg/dL — ABNORMAL HIGH (ref 0.3–1.2)
Total Protein: 6.3 g/dL — ABNORMAL LOW (ref 6.5–8.1)

## 2023-06-23 LAB — PROTIME-INR
INR: 1.3 — ABNORMAL HIGH (ref 0.8–1.2)
Prothrombin Time: 16 s — ABNORMAL HIGH (ref 11.4–15.2)

## 2023-06-23 LAB — TSH: TSH: 1.32 u[IU]/mL (ref 0.350–4.500)

## 2023-06-23 LAB — SODIUM
Sodium: 119 mmol/L — CL (ref 135–145)
Sodium: 122 mmol/L — ABNORMAL LOW (ref 135–145)

## 2023-06-23 LAB — MAGNESIUM: Magnesium: 2 mg/dL (ref 1.7–2.4)

## 2023-06-23 LAB — HEPARIN LEVEL (UNFRACTIONATED): Heparin Unfractionated: 0.29 [IU]/mL — ABNORMAL LOW (ref 0.30–0.70)

## 2023-06-23 SURGERY — ECHOCARDIOGRAM, TRANSESOPHAGEAL
Anesthesia: Monitor Anesthesia Care

## 2023-06-23 MED ORDER — DILTIAZEM HCL ER COATED BEADS 120 MG PO CP24
120.0000 mg | ORAL_CAPSULE | Freq: Every day | ORAL | Status: DC
  Administered 2023-06-23 – 2023-06-24 (×2): 120 mg via ORAL
  Filled 2023-06-23 (×2): qty 1

## 2023-06-23 MED ORDER — TROSPIUM CHLORIDE 20 MG PO TABS
20.0000 mg | ORAL_TABLET | Freq: Two times a day (BID) | ORAL | Status: DC
  Administered 2023-06-23 – 2023-06-24 (×2): 20 mg via ORAL
  Filled 2023-06-23 (×3): qty 1

## 2023-06-23 MED ORDER — TOLVAPTAN 15 MG PO TABS
15.0000 mg | ORAL_TABLET | Freq: Once | ORAL | Status: AC
  Administered 2023-06-23: 15 mg via ORAL
  Filled 2023-06-23: qty 1

## 2023-06-23 MED ORDER — WARFARIN SODIUM 5 MG PO TABS
5.0000 mg | ORAL_TABLET | Freq: Once | ORAL | Status: AC
  Administered 2023-06-23: 5 mg via ORAL
  Filled 2023-06-23: qty 1

## 2023-06-23 MED ORDER — NON FORMULARY: 20.0000 mg | Freq: Two times a day (BID) | Status: DC

## 2023-06-23 NOTE — Consult Note (Addendum)
Renal Service Consult Note [facility]  Hannah Downs 06/23/2023 Hannah Krabbe, Downs Requesting Physician: Dr. Isidoro Downs  Reason for Consult: Hyponatremia HPI: The patient is a 77 y.o. year-old w/ PMH as below who presented on 10/07 for elective surgery related to anterior vaginal prolapse. She underwent surgery on 10/07. Na+ level on admission was 126.  Post-op she developed new-onset afib w/ RVR. She rec'd IV heparin and beta-blocker. Her Na+ levels dropped from 126 down to 120 today. We are asked to see for hyponatremia.  Pt denies any pain or nausea. Doesn't have any appetite. No confusion. She has had the foley cath in since the surgery. No hx of low Na+.   Back in feb 2023 had hospital admission and her lowest Na+ was 127. Did not require treatment.   I/O's 3.3 L in and 2.5 L out = + 800 cc.  Home meds:  hydrochlorothiazide 25 every day, klor-con, advil, asa, lipitor, lovenox, estrace, toprol xl 150 every day, prilosec, sanctura, warfarin   IV meds here: IV mag 4 gm  IM mag 2 gm LR at 50 cc/hr LR 1000 cc bolus  IV heparin IV albumin 0.9% saline at 75 cc/hr IV metoprolol prn  PO meds here: Lipitor Tessalon Cardizem CD Diltiazem 30mg  Lovenox Lasix 20mg  IV x 1 on 10/09 Gabapentin Claritin Metoprolol xl Prilosec  Pyridium Miralax Klorcon warfarin  PRNs Guaifenesin Cepacol tylenol    ROS - denies CP, no joint pain, no HA, no blurry vision, no rash, no diarrhea, no nausea/ vomiting, no dysuria, no difficulty voiding   Past Medical History  Past Medical History:  Diagnosis Date   Anticoagulated on Coumadin    managed by coumadin clinic   Arthritis    Dyspnea    exertion   GERD (gastroesophageal reflux disease)    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   neurologist--- dr Pearlean Downs;   11-06-2021  left frontoparietal cortical embolic infarct secondary AFib whilie off anticoagulant for emergent surgery then   post surgery 11-12-2022 on eliquis , acute right cerebellar PICA territory infarct ;  pt now on chronic coumadin, managed by clinic   Hyperlipidemia    Hypertension    Lumbar degenerative disc disease    Osteopenia    PAF (paroxysmal atrial fibrillation) (HCC) 2021   cardiologist-- dr Melburn Downs;  evaluted event monitor 03-26-2020 showed 1% burden  Afib   Presence of pessary    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   Seasonal allergies    Varicose vein    right leg   Vitamin D deficiency    Wears glasses    Past Surgical History  Past Surgical History:  Procedure Laterality Date   COLPOCLEISIS N/A 06/20/2023   Procedure: LEFORTE COLPOCLEISIS; LEVATOR PLICATION WITH PERINEORRHAPHY;  Surgeon: Hannah Beards, Downs;  Location: [facility] Downs;  Service: Gynecology;  Laterality: N/A;   CYSTOSCOPY N/A 06/20/2023   Procedure: CYSTOSCOPY;  Surgeon: Hannah Beards, Downs;  Location: Hannah Downs;  Service: Gynecology;  Laterality: N/A;   DILATION AND CURETTAGE OF UTERUS  1998   FEMORAL HERNIA REPAIR Left 11/02/2006   @[facility]   by dr Hannah Downs;   Emergent Inguinal exploratory & primary repair femoral hernia and Reduction of small bowel obstruction   INGUINAL HERNIA REPAIR Left 02/03/2007   @[facility]  by dr Hannah Downs;   Open recurrent repair [facility] w/ mesh   INGUINAL HERNIA REPAIR N/A 11/04/2021   Procedure:  lysis of adhesions reduction of incarcerated recurrent left femoral hernia laparoscopic repair. laparoscopic right femoral hernia repair and right obturator repair with mesh. tap block;  Surgeon: Hannah Soda, Downs;  Location: [facility];  Service: General;  Laterality: N/A;   ROBOTIC ASSISTED DIAGNOSTIC LAPAROSCOPY  09/18/2021   @[facility]  by dr Hannah Downs;   ATTEMPTED  reduction recurrent inguinal hernia,  aborted due to involvement of bladder and possible vagina   TOTAL HIP ARTHROPLASTY Right 06/26/2014   Procedure: RIGHT TOTAL HIP  ARTHROPLASTY ANTERIOR APPROACH WITH ACETABULAR AUTOGRAFT;  Surgeon: Hannah Drilling, Downs;  Location: [facility];  Service: Orthopedics;  Laterality: Right;   Family History  Family History  Problem Relation Age of Onset   Cancer Mother    Stroke Father    Social History  reports that she has never smoked. She has never used smokeless tobacco. She reports that she does not drink alcohol and does not use drugs. Allergies  Allergies  Allergen Reactions   Lisinopril Cough   Home medications Prior to Admission medications   Medication Sig Start Date End Date Taking? Authorizing Provider  acetaminophen (TYLENOL) 500 MG tablet Take 1 tablet (500 mg total) by mouth every 6 (six) hours as needed (pain). Patient taking differently: Take 1,000 mg by mouth at bedtime as needed for moderate pain. 06/01/23  Yes Hannah Downs  aspirin EC 81 MG tablet Take 81 mg by mouth daily. Swallow whole.   Yes Hannah Downs  atorvastatin (LIPITOR) 20 MG tablet Take 1 tablet by mouth once daily Patient taking differently: Take 20 mg by mouth daily. 12/09/22  Yes Hannah Downs  Cholecalciferol (VITAMIN D3) 50 MCG (2000 UT) TABS Take 2,000 Units by mouth in the morning.   Yes Hannah Downs  enoxaparin (LOVENOX) 60 MG/0.6ML injection Inject 0.6 mLs (60 mg total) into the skin every 12 (twelve) hours. 06/16/23  Yes Hannah Downs  estradiol (ESTRACE) 0.1 MG/GM vaginal cream PLACE 0.5 GRAMS VAGINALLY TWICE A WEEK Patient taking differently: Place 1 Applicatorful vaginally 2 (two) times a week. Monday and Friday's evening 03/28/23  Yes Hannah Downs  hydrochlorothiazide (HYDRODIURIL) 25 MG tablet Take 1 tablet by mouth once daily 06/21/23  Yes Hannah Downs  metoprolol succinate (TOPROL-XL) 100 MG 24 hr tablet TAKE 1 & 1/2 (ONE & ONE-HALF) TABLETS BY MOUTH ONCE DAILY (TAKE  WITH  OR  IMMEDIATELY  FOLLOWING  A  MEAL) Patient taking differently: Take 150 mg by mouth daily.  11/12/22  Yes Hannah Downs  omeprazole (PRILOSEC OTC) 20 MG tablet Take 20 mg by mouth daily.   Yes Hannah Downs  potassium chloride (KLOR-CON) 10 MEQ tablet Take 1 tablet by mouth once daily 06/21/23  Yes Hannah Downs  trospium (SANCTURA) 20 MG tablet Take 1 tablet (20 mg total) by mouth 2 (two) times daily. Patient taking differently: Take 20 mg by mouth 2 (two) times daily. Morning and afternoon 01/13/23  Yes Zuleta, Joan Mayans, Downs  warfarin (COUMADIN) 5 MG tablet TAKE 1 TO 1 & 1/2 (ONE & ONE-HALF) TABLETS BY MOUTH ONCE DAILY Patient taking differently: Take 5-7.5 mg by mouth daily. Take 5 mg by mouth once daily on Sunday, Monday, Wednesday, Thursday, Friday, and Saturday. Take 7.5 mg by mouth once daily on Tuesdays. 06/06/23  Yes Hannah Downs     Vitals:   06/23/23 1610 06/23/23 0346 06/23/23 0624 06/23/23 1324  BP: 110/78 121/83 114/72  106/87  Pulse: 68 88 84 81  Resp: 20   (!) 24  Temp:  98.1 F (36.7 C)  98.5 F (36.9 C)  TempSrc:  Oral  Oral  SpO2:  94%  100%  Weight:      Height:       Exam Gen alert, no distress No rash, cyanosis or gangrene Sclera anicteric, throat clear  No jvd or bruits Chest clear bilat to bases, no rales/ wheezing RRR no MRG Abd soft ntnd no mass or ascites +bs GU foley draining moderately dark urine MS no joint effusions or deformity Ext no pitting LE or UE edema, no wounds or ulcers Neuro is alert, Ox 3 , nf    Renal-related home meds: - hydrochlorothiazide 25 qd - klorcon - advil - metoprolol xl 150 every day - others: warfarin, prilosec, extrace, lovenox, asa, lipitor     UNa 94   UOsm 260    Na 119   K+ 3.6  CO2 24  UBN 9  creat 0.43   CA 8.1     WBC 11   Hb 10.4       TSH 1.32     Am cortisol - pending   Assessment/ Plan: Hyponatremia - Na+ was 126 on admission 4 days ago and now is down to 120.  This after surgery for vaginal prolapse done on 10/07. Risk factors for hyponatremia include post  surgery pain and nausea, and pre- admission hydrochlorothiazide pt was taking for HTN (did not receive any here). I don't think she is symptomatic. No signs of CHF (CXR no edema), cirrhosis or renal faliure. 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 Would avoid thiazide diuretics from here on. Will give tolvaptan 15 mg today, cont serum Na+ every 6 hrs. Will follow.  SP repair of vaginal prolapse HTN - on BB only Afib / RVR - new onset, post -op Mild LE edema - possible acute HFpEF d/t afib. She rec'd IV lasix 10/9 x 1.       Vinson Moselle  Downs CKA 06/23/2023, 2:16 PM  Recent Labs  Lab 06/22/23 0409 06/22/23 1927 06/23/23 0424 06/23/23 0846  HGB 10.9*  --  10.4*  --   CALCIUM 8.5* 8.5*  --  8.1*  CREATININE 0.47 0.48  --  0.43*  K 4.0 4.1  --  3.6   Inpatient medications:  atorvastatin  20 mg Oral Daily   benzonatate  100 mg Oral TID   Chlorhexidine Gluconate Cloth  6 each Topical Daily   cholecalciferol  2,000 Units Oral q AM   diltiazem  120 mg Oral Daily   loratadine  10 mg Oral Daily   metoprolol tartrate  100 mg Oral BID   omeprazole  20 mg Oral Daily   polyethylene glycol  17 g Oral Daily   potassium chloride  10 mEq Oral Daily   warfarin  5 mg Oral ONCE-1600   Warfarin - Pharmacist Dosing Inpatient   Does not apply q1600    heparin 950 Units/hr (06/23/23 1033)   acetaminophen, guaiFENesin, menthol-cetylpyridinium, ondansetron **OR** ondansetron (ZOFRAN) IV, simethicone, traMADol

## 2023-06-23 NOTE — Progress Notes (Signed)
Urogyn Progress Note  Subjective: POD#3 s/p LeForte Colpocleisis, levator plication, perineorrhaphy, cystoscopy, excision of urethral prolapse. Admitted for rate control of atrial fibrillation.   Feeling well. No pain. Tolerating diet. Has been ambulating and HR has been better controlled. Currently up in chair. Foley catheter has been draining but she did wake up today very wet per daughter.   Objective: Vitals:   06/23/23 0624 06/23/23 1324  BP: 114/72 106/87  Pulse: 84 81  Resp:  (!) 24  Temp:  98.5 F (36.9 C)  SpO2:  100%     General: alert and cooperative Abdomen: soft, nontender CV: RRR Lungs: clear to auscultation b/l Extremities: extremities with bilateral edema Vaginal Bleeding: minimal  Lab Results  Component Value Date   WBC 11.5 (H) 06/23/2023   HGB 10.4 (L) 06/23/2023   HCT 29.6 (L) 06/23/2023   MCV 88.4 06/23/2023   PLT 218 06/23/2023   Lab Results  Component Value Date   NA 119 (LL) 06/23/2023   K 3.6 06/23/2023   CL 88 (L) 06/23/2023   CO2 24 06/23/2023    Assessment/Plan:  - Appreciate medical management by Hospitalist and Cardiology teams - Healing well post-operatively, no current concerns - regular diet - ambulate as tolerate, SCDs in bed - pain control with PO meds prn- tylenol or oxycodone - Keep foley due to removal of urethral prolapse. Will keep on discharge and remove next week in office.  - Has not been getting trospium as it is not on formulary. Will have patient bring from home and add as med and hopefully this will improve some of her leakage symptoms.   Will continue to follow   LOS: 3 days    Marguerita Beards, MD 06/23/2023, 1:56 PM

## 2023-06-23 NOTE — Progress Notes (Addendum)
Progress Note  Patient Name: Hannah Downs Date of Encounter: 06/23/2023  Primary Cardiologist: Kristeen Miss, MD  Subjective   She is feeling good. Dyspnea improved. Edema back to baseline. Remains in rate controlled AFib.  Inpatient Medications    Scheduled Meds:  atorvastatin  20 mg Oral Daily   benzonatate  100 mg Oral TID   Chlorhexidine Gluconate Cloth  6 each Topical Daily   cholecalciferol  2,000 Units Oral q AM   diltiazem  30 mg Oral Q6H   loratadine  10 mg Oral Daily   metoprolol tartrate  100 mg Oral BID   omeprazole  20 mg Oral Daily   polyethylene glycol  17 g Oral Daily   potassium chloride  10 mEq Oral Daily   warfarin  5 mg Oral ONCE-1600   Warfarin - Pharmacist Dosing Inpatient   Does not apply q1600   Continuous Infusions:  heparin 950 Units/hr (06/23/23 1033)   magnesium sulfate bolus IVPB Stopped (06/22/23 1046)   PRN Meds: acetaminophen, guaiFENesin, menthol-cetylpyridinium, ondansetron **OR** ondansetron (ZOFRAN) IV, simethicone, traMADol   Vital Signs    Vitals:   06/22/23 2105 06/23/23 0103 06/23/23 0346 06/23/23 0624  BP:  110/78 121/83 114/72  Pulse:  68 88 84  Resp: 18 20    Temp: 99.1 F (37.3 C)  98.1 F (36.7 C)   TempSrc: Oral  Oral   SpO2: 93%  94%   Weight:      Height:        Intake/Output Summary (Last 24 hours) at 06/23/2023 1130 Last data filed at 06/23/2023 0619 Gross per 24 hour  Intake 728.77 ml  Output 1475 ml  Net -746.23 ml      06/20/2023   10:27 AM 06/13/2023    3:54 PM 01/05/2023    1:29 PM  Last 3 Weights  Weight (lbs) 125 lb 1.6 oz 124 lb 122 lb 12.8 oz  Weight (kg) 56.745 kg 56.246 kg 55.702 kg     Telemetry    AFib - Personally Reviewed  Physical Exam   GEN: No acute distress.  HEENT: Normocephalic, atraumatic, sclera non-icteric. Neck: No JVD or bruits. Cardiac: Irregularly irregular, no murmurs, rubs, or gallops.  Respiratory: Clear to auscultation bilaterally. Breathing is  unlabored. GI: Soft, nontender, non-distended, BS +x 4. MS: no deformity. Extremities: No clubbing or cyanosis. Mild soft BLE edema with varicose veins. Distal pedal pulses are 2+ and equal bilaterally. Neuro:  AAOx3. Follows commands. Psych:  Responds to questions appropriately with a normal affect.  Labs    High Sensitivity Troponin:   Recent Labs  Lab 06/20/23 1955  TROPONINIHS 8      Cardiac EnzymesNo results for input(s): "TROPONINI" in the last 168 hours. No results for input(s): "TROPIPOC" in the last 168 hours.   Chemistry Recent Labs  Lab 06/22/23 0409 06/22/23 1927 06/23/23 0846  NA 121* 120*  119* 121*  K 4.0 4.1 3.6  CL 91* 87* 88*  CO2 22 23 24   GLUCOSE 125* 142* 113*  BUN 10 10 9   CREATININE 0.47 0.48 0.43*  CALCIUM 8.5* 8.5* 8.1*  GFRNONAA >60 >60 >60  ANIONGAP 8 10 9      Hematology Recent Labs  Lab 06/21/23 0610 06/22/23 0409 06/23/23 0424  WBC 14.9* 14.5* 11.5*  RBC 3.54* 3.49* 3.35*  HGB 10.8* 10.9* 10.4*  HCT 32.8* 31.6* 29.6*  MCV 92.7 90.5 88.4  MCH 30.5 31.2 31.0  MCHC 32.9 34.5 35.1  RDW 12.8 12.8 12.7  PLT 248  219 218    BNP Recent Labs  Lab 06/22/23 0359  BNP 661.0*     DDimer No results for input(s): "DDIMER" in the last 168 hours.   Radiology    No results found.  Cardiac Studies   10/2021 Echo    1. Technically difficult; no parasternal views.   2. Left ventricular ejection fraction, by estimation, is 60 to 65%. The  left ventricle has normal function. The left ventricle has no regional  wall motion abnormalities. Left ventricular diastolic parameters are  consistent with Grade II diastolic  dysfunction (pseudonormalization).   3. Right ventricular systolic function is normal. The right ventricular  size is normal.   4. Left atrial size was mildly dilated.   5. Right atrial size was mildly dilated.   6. The mitral valve is normal in structure. Trivial mitral valve  regurgitation. No evidence of mitral  stenosis.   7. The aortic valve was not assessed. Aortic valve regurgitation is not  visualized. No aortic stenosis is present.   8. Pulmonic valve regurgitation Not assessed.   9. The inferior vena cava is normal in size with greater than 50%  respiratory variability, suggesting right atrial pressure of 3 mmHg.   Patient Profile     77 y.o. 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 She tolerated the procedure well with no immediate complications but then was noted to go into rapid atrial fibrillation post-operatively, prompting cardiology consultation.  Assessment & Plan    1. Paroxysmal Atrial Fibrillation - suspect recurrence driven by stress of surgery and electrolyte disturbances - on Toprol 150mg  at home, transitioned to Lopressor 100mg  BID + diltiazem 30mg  q6hr as well - will discuss strategy with MD but anticipate rate control to begin with, allowing recovery of surgery + electrolytes before considering whether outpatient DCCV would be needed if she remains out of rhythm - given time since last echo, ? Consider repeat, did not order, pending MD review - on heparin/Coumadin per pharmacy (bridging needed d/t hx of recurrent CVA including when off anticoagulation for surgery in the past) - check TSH  2. Significant hyponatremia, hypomagnesemia - will add Mg level to labs today but will defer lyte management to IM - get baseline CXR  3. Mild DOE/lower extremity edema possibly representing acute HFpEF in setting of AF RVR - BNP 661, received 1 dose IV Lasix on 10/9 - would hold off further diuresis given marked hyponatremia  4. HTN - manage in  context above - continue to hold hydrochlorothiazide - would not resume at DC given hyponatremia issues    Otherwise, per primary team or [facility]: - Ureterovaginal prolapse - Hyponatremia - GERD - History of recurrent CVA  For questions or updates, please contact [facility] Please consult [email or web address] for contact info under Cardiology/STEMI.  Signed, [name], PA-C 06/23/2023, 11:30 AM    Personally seen and examined. Agree with APP above with the following comments: No events overnight.  No symptoms with ambulation, overall feels back to baseline.  Exam notable for IRIR rhythm (Rate controlled) with no crackles or wheezes.  Minimal LE edema.  Otherwise as above. Tele: AF rate controlled < 110 bpm.  Would recommend  - will continue AV nodal therapy and consolidation  to 120 mg XL diltiazem - will need echo as outpatient if she is still in AF persistently at post operative follow up as this will help device post op f/u - agree with TSH check, Mg WNL - defer hyponatremia work up to primary - hold off further diuresis at this time - coumadin as per pharmacy protocol - signing off with plans for AF or Dr. Harvie Bridge team follow up - reviewed plan with patient and daugther  Riley Lam, MD FASE [facility] Cardiologist [facility]  [facility]  [address] [place], [address] [place], [place] [address] [phone]  12:58 PM

## 2023-06-23 NOTE — Plan of Care (Signed)
  Problem: Health Behavior/Discharge Planning: Goal: Ability to manage health-related needs will improve Outcome: Progressing   Problem: Clinical Measurements: Goal: Will remain free from infection Outcome: Progressing Goal: Diagnostic test results will improve Outcome: Progressing   

## 2023-06-23 NOTE — Progress Notes (Signed)
Echocardiogram 2D Echocardiogram has been performed.  Hannah Downs 06/23/2023, 6:22 PM

## 2023-06-23 NOTE — Consult Note (Signed)
PHARMACY - ANTICOAGULATION CONSULT NOTE  Pharmacy Consult for heparin & warfarin Indication: atrial fibrillation  Allergies  Allergen Reactions   Lisinopril Cough    Patient Measurements: Height: 5\' 1"  (154.9 cm) Weight: 56.7 kg (125 lb 1.6 oz) IBW/kg (Calculated) : 47.8 Heparin Dosing Weight: 56.7 kg  Vital Signs: Temp: 98.1 F (36.7 C) (10/10 0346) Temp Source: Oral (10/10 0346) BP: 121/83 (10/10 0346) Pulse Rate: 88 (10/10 0346)  Labs: Recent Labs    06/20/23 1101 06/20/23 1955 06/20/23 2047 06/21/23 0610 06/21/23 1414 06/22/23 0409 06/22/23 1255 06/23/23 0424  HGB 13.9  --    < > 10.8*  --  10.9*  --  10.4*  HCT 41.0  --    < > 32.8*  --  31.6*  --  29.6*  PLT  --   --   --  248  --  219  --  218  LABPROT  --   --   --  14.2  --  14.3  --  16.0*  INR  --   --   --  1.1  --  1.1  --  1.3*  HEPARINUNFRC  --   --   --  0.41   < > 0.20* 0.37 0.29*  CREATININE 0.60  --   --  0.62  --  0.47  --   --   TROPONINIHS  --  8  --   --   --   --   --   --    < > = values in this interval not displayed.    Estimated Creatinine Clearance: 44.4 mL/min (by C-G formula based on SCr of 0.47 mg/dL).   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   neurologist--- dr Pearlean Brownie;   11-06-2021  left frontoparietal cortical embolic infarct secondary AFib whilie off anticoagulant for emergent surgery then  post surgery 11-12-2022 on eliquis , acute right cerebellar PICA territory infarct ;  pt now on chronic coumadin, managed by clinic   Hyperlipidemia    Hypertension    Lumbar degenerative disc disease    Osteopenia    PAF (paroxysmal atrial fibrillation) (HCC) 2021   cardiologist-- dr Melburn Popper;  evaluted event monitor 03-26-2020 showed 1% burden  Afib    Presence of pessary    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 Home dose is 5 mg every day except 7.5 mg on Tuesdays. Warfarin held 5 days prior to surgery and patient bridged on 60 mg BID Lovenox. Post-op patient developed Afib with RVR and hypotension. Patient is high risk for recurrent stroke due to hx of CVA with missed anticoagulation. Patient currently being bridged with heparin, pharmacy is being consulted to restart warfarin.    06/23/23 Heparin level = 0.29 (slightly subtherapeutic) with heparin gtt @ 900 units/hr INR = 1.3 Hgb = 10.4, Pltc 218 No complications of therapy noted    Goal of Therapy:  INR 2-3 Heparin level 0.3-0.7 units/ml Monitor  platelets by anticoagulation protocol: Yes   Plan:  - Increase heparin infusion to 950 units/hr - Warfarin 5mg  po x 1 dose today - Daily heparin level, CBC, and INR with AM labs  - Monitor for new or worsening bleeding given she is post-op  Terrilee Files, PharmD 06/23/2023 5:18 AM

## 2023-06-23 NOTE — Progress Notes (Signed)
[facility]                                                                              Hannah Downs, is a 77 y.o. female, DOB - August 18, 1946, ZOX:[id number] Admit date - 06/20/2023    Outpatient Primary MD for the patient is [name], MD  LOS - 3  days  No chief complaint on file.      Brief summary   Patient is a 77 year old female with history of paroxysmal atrial fibrillation, recurrent CVA, on Coumadin who underwent pelvic organ prolapse repair on 10/7.  Postoperatively developed atrial fibrillation with RVR, hypotension and [facility] was consulted for medical management.   Assessment & Plan    Principal Problem:   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 organs -Management per GYN   Active Problems:   Paroxysmal atrial fibrillation with RVR (HCC) -Continue Lopressor, diltiazem, cardiology following  - received Lasix 20 mg IV x 1 on 10/9 -On heparin/Coumadin per pharmacy  Acute diastolic CHF -Reported chest heaviness, DOE, likely due to paroxysmal A-fib with RVR  -BNP 661, received Lasix 20 mg IV x 1 on 10/9 -Follow 2D echo     Hx of stroke without residual deficits -History of prior embolic strokes in 2022 in 2023 while on DOAC -Currently on IV heparin drip  Acute on chronic hyponatremia -Likely due to volume overload with lower extremity edema, DOE -Serum osmolarity 262, urine osmolarity 260, urine Na 94 -Received Lasix 20 mg IV x 1 on 10/9, with no significant improvement -Sodium trended down to 119 again this morning, Lasix held, nephrology consulted, may need tolvaptan, will await recommendations  .  Sodium 134 on 11/05/2022, presented with NA 131--> 121 today -Received Lasix 20 mg IV x 1, will recheck bmet -Obtain serum osmolality, urine osmolality, UNA    Essential hypertension -BP currently stable, discontinue HCTZ at discharge due to marked hyponatremia     Hyperlipidemia -Continue statin  Estimated body mass index is 23.64 kg/m as  calculated from the following:   Height as of this encounter: 5\' 1"  (1.549 m).   Weight as of this encounter: 56.7 kg.  Code Status: Full code DVT Prophylaxis:  SCDs Start: 06/20/23 1959 warfarin (COUMADIN) tablet 5 mg   Level of Care: Level of care: Telemetry Family Communication: Updated patient Disposition Plan:      Remains inpatient appropriate:      Procedures:  LeForte Colpocleisis, levator plication, perineorrhaphy, cystoscopy, excision of urethral prolapse   Consultants:   Admitted by GYN Cardiology Nephrology  Antimicrobials:   Anti-infectives (From admission, onward)    Start     Dose/Rate Route Frequency Ordered Stop   06/20/23 1030  ceFAZolin (ANCEF) IVPB 2g/100 mL premix        2 g 200 mL/hr over 30 Minutes Intravenous On call to O.R. 06/20/23 1018 06/20/23 1326          Medications  atorvastatin  20 mg Oral Daily   benzonatate  100 mg Oral TID   Chlorhexidine Gluconate Cloth  6 each Topical Daily   cholecalciferol  2,000 Units Oral q AM   diltiazem  120 mg Oral Daily  loratadine  10 mg Oral Daily   metoprolol tartrate  100 mg Oral BID   omeprazole  20 mg Oral Daily   polyethylene glycol  17 g Oral Daily   potassium chloride  10 mEq Oral Daily   warfarin  5 mg Oral ONCE-1600   Warfarin - Pharmacist Dosing Inpatient   Does not apply q1600      Subjective:   Taleen Cutsforth was seen and examined today.  Alert and oriented, no acute complaints per the patient, heart rate now controlled.  No significant chest pain or shortness of breath.  No nausea vomiting or any diarrhea.    Objective:   Vitals:   06/23/23 0103 06/23/23 0346 06/23/23 0624 06/23/23 1324  BP: 110/78 121/83 114/72 106/87  Pulse: 68 88 84 81  Resp: 20   (!) 24  Temp:  98.1 F (36.7 C)  98.5 F (36.9 C)  TempSrc:  Oral  Oral  SpO2:  94%  100%  Weight:      Height:        Intake/Output Summary (Last 24 hours) at 06/23/2023 1435 Last data filed at 06/23/2023  2951 Gross per 24 hour  Intake 488.77 ml  Output 625 ml  Net -136.23 ml     Wt Readings from Last 3 Encounters:  06/20/23 56.7 kg  01/05/23 55.7 kg  10/05/22 54.5 kg   Physical Exam General: Alert and oriented x 3, NAD Cardiovascular: Irregularly rate Respiratory: CTAB, no wheezing, rales  Gastrointestinal: Soft, nontender, nondistended, NBS Ext: 1+  pedal edema bilaterally Neuro: no new deficits Psych: Normal affect    Data Reviewed:  I have personally reviewed following labs    CBC Lab Results  Component Value Date   WBC 11.5 (H) 06/23/2023   RBC 3.35 (L) 06/23/2023   HGB 10.4 (L) 06/23/2023   HCT 29.6 (L) 06/23/2023   MCV 88.4 06/23/2023   MCH 31.0 06/23/2023   PLT 218 06/23/2023   MCHC 35.1 06/23/2023   RDW 12.7 06/23/2023   LYMPHSABS 0.9 11/11/2021   MONOABS 0.7 11/11/2021   EOSABS 0.0 11/11/2021   BASOSABS 0.0 11/11/2021     Last metabolic panel Lab Results  Component Value Date   NA 119 (LL) 06/23/2023   K 3.6 06/23/2023   CL 88 (L) 06/23/2023   CO2 24 06/23/2023   BUN 9 06/23/2023   CREATININE 0.43 (L) 06/23/2023   GLUCOSE 113 (H) 06/23/2023   GFRNONAA >60 06/23/2023   GFRAA >90 06/27/2014   CALCIUM 8.1 (L) 06/23/2023   PHOS 3.4 11/06/2021   PROT 5.8 (L) 11/12/2021   ALBUMIN 3.1 (L) 11/12/2021   BILITOT 1.2 11/12/2021   ALKPHOS 56 11/12/2021   AST 84 (H) 11/12/2021   ALT 77 (H) 11/12/2021   ANIONGAP 9 06/23/2023    CBG (last 3)  No results for input(s): "GLUCAP" in the last 72 hours.    Coagulation Profile: Recent Labs  Lab 06/21/23 0610 06/22/23 0409 06/23/23 0424  INR 1.1 1.1 1.3*     Radiology Studies: I have personally reviewed the imaging studies  No results found.     Thad Ranger M.D. [facility] 06/23/2023, 2:35 PM  Available via Epic secure chat 7am-7pm After 7 pm, please refer to night coverage provider listed on amion.

## 2023-06-24 ENCOUNTER — Other Ambulatory Visit (HOSPITAL_COMMUNITY): Payer: Self-pay

## 2023-06-24 DIAGNOSIS — Z8673 Personal history of transient ischemic attack (TIA), and cerebral infarction without residual deficits: Secondary | ICD-10-CM | POA: Diagnosis not present

## 2023-06-24 DIAGNOSIS — I5033 Acute on chronic diastolic (congestive) heart failure: Secondary | ICD-10-CM | POA: Diagnosis not present

## 2023-06-24 DIAGNOSIS — I10 Essential (primary) hypertension: Secondary | ICD-10-CM | POA: Diagnosis not present

## 2023-06-24 DIAGNOSIS — I480 Paroxysmal atrial fibrillation: Secondary | ICD-10-CM | POA: Diagnosis not present

## 2023-06-24 LAB — CBC
HCT: 34.7 % — ABNORMAL LOW (ref 36.0–46.0)
Hemoglobin: 11.9 g/dL — ABNORMAL LOW (ref 12.0–15.0)
MCH: 31.2 pg (ref 26.0–34.0)
MCHC: 34.3 g/dL (ref 30.0–36.0)
MCV: 91.1 fL (ref 80.0–100.0)
Platelets: 285 10*3/uL (ref 150–400)
RBC: 3.81 MIL/uL — ABNORMAL LOW (ref 3.87–5.11)
RDW: 13.1 % (ref 11.5–15.5)
WBC: 9.7 10*3/uL (ref 4.0–10.5)
nRBC: 0 % (ref 0.0–0.2)

## 2023-06-24 LAB — BASIC METABOLIC PANEL
Anion gap: 11 (ref 5–15)
BUN: 7 mg/dL — ABNORMAL LOW (ref 8–23)
CO2: 27 mmol/L (ref 22–32)
Calcium: 9.3 mg/dL (ref 8.9–10.3)
Chloride: 96 mmol/L — ABNORMAL LOW (ref 98–111)
Creatinine, Ser: 0.49 mg/dL (ref 0.44–1.00)
GFR, Estimated: >60 mL/min (ref >60)
Glucose, Bld: 112 mg/dL — ABNORMAL HIGH (ref 70–99)
Potassium: 3.9 mmol/L (ref 3.5–5.1)
Sodium: 134 mmol/L — ABNORMAL LOW (ref 135–145)

## 2023-06-24 LAB — ECHOCARDIOGRAM COMPLETE
AR max vel: 2 cm2
AV Peak grad: 4.5 mm[Hg]
Ao pk vel: 1.06 m/s
Area-P 1/2: 4.8 cm2
Height: 61 in
MV M vel: 4.9 m/s
MV Peak grad: 96 mm[Hg]
S' Lateral: 2.7 cm
Weight: 2001.6 [oz_av]

## 2023-06-24 LAB — PROTIME-INR
INR: 1.4 — ABNORMAL HIGH (ref 0.8–1.2)
Prothrombin Time: 17 s — ABNORMAL HIGH (ref 11.4–15.2)

## 2023-06-24 LAB — SODIUM
Sodium: 129 mmol/L — ABNORMAL LOW (ref 135–145)
Sodium: 135 mmol/L (ref 135–145)

## 2023-06-24 LAB — HEPARIN LEVEL (UNFRACTIONATED): Heparin Unfractionated: 0.4 [IU]/mL (ref 0.30–0.70)

## 2023-06-24 LAB — CORTISOL-AM, BLOOD: Cortisol - AM: 20.7 ug/dL (ref 6.7–22.6)

## 2023-06-24 LAB — MAGNESIUM: Magnesium: 2.3 mg/dL (ref 1.7–2.4)

## 2023-06-24 MED ORDER — METOPROLOL TARTRATE 100 MG PO TABS: 100.0000 mg | ORAL_TABLET | Freq: Two times a day (BID) | ORAL | 3 refills | Status: DC

## 2023-06-24 MED ORDER — ONDANSETRON HCL 4 MG PO TABS: 4.0000 mg | ORAL_TABLET | Freq: Three times a day (TID) | ORAL | 0 refills | Status: DC | PRN

## 2023-06-24 MED ORDER — SODIUM CHLORIDE 1 G PO TABS: 2.0000 g | ORAL_TABLET | Freq: Two times a day (BID) | ORAL | 0 refills | Status: AC

## 2023-06-24 MED ORDER — DILTIAZEM HCL ER COATED BEADS 120 MG PO CP24
120.0000 mg | ORAL_CAPSULE | Freq: Every day | ORAL | 3 refills | Status: DC
  Filled 2023-06-24: qty 30, 30d supply, fill #0

## 2023-06-24 MED ORDER — ONDANSETRON HCL 4 MG PO TABS
4.0000 mg | ORAL_TABLET | Freq: Three times a day (TID) | ORAL | 0 refills | Status: DC | PRN
  Filled 2023-06-24: qty 20, 7d supply, fill #0

## 2023-06-24 MED ORDER — SIMETHICONE 80 MG PO CHEW: 80.0000 mg | CHEWABLE_TABLET | Freq: Four times a day (QID) | ORAL | 0 refills | Status: DC | PRN

## 2023-06-24 MED ORDER — SIMETHICONE 80 MG PO CHEW
80.0000 mg | CHEWABLE_TABLET | Freq: Four times a day (QID) | ORAL | 0 refills | Status: DC | PRN
  Filled 2023-06-24: qty 30, 8d supply, fill #0

## 2023-06-24 MED ORDER — WARFARIN SODIUM 5 MG PO TABS
7.5000 mg | ORAL_TABLET | Freq: Once | ORAL | Status: AC
  Administered 2023-06-24: 7.5 mg via ORAL
  Filled 2023-06-24: qty 1

## 2023-06-24 MED ORDER — SODIUM CHLORIDE 1 G PO TABS: 2.0000 g | ORAL_TABLET | Freq: Two times a day (BID) | ORAL | Status: DC

## 2023-06-24 MED ORDER — BENZONATATE 100 MG PO CAPS
100.0000 mg | ORAL_CAPSULE | Freq: Three times a day (TID) | ORAL | 0 refills | Status: DC | PRN
  Filled 2023-06-24: qty 30, 10d supply, fill #0

## 2023-06-24 MED ORDER — SODIUM CHLORIDE 1 G PO TABS
2.0000 g | ORAL_TABLET | Freq: Two times a day (BID) | ORAL | 0 refills | Status: DC
  Filled 2023-06-24: qty 20, 5d supply, fill #0

## 2023-06-24 MED ORDER — POLYETHYLENE GLYCOL 3350 17 G PO PACK: 17.0000 g | PACK | Freq: Every day | ORAL | 0 refills | Status: DC

## 2023-06-24 MED ORDER — SODIUM CHLORIDE 1 G PO TABS
2.0000 g | ORAL_TABLET | Freq: Two times a day (BID) | ORAL | Status: DC
  Administered 2023-06-24: 2 g via ORAL
  Filled 2023-06-24: qty 2

## 2023-06-24 MED ORDER — GUAIFENESIN 100 MG/5ML PO LIQD
5.0000 mL | ORAL | 0 refills | Status: DC | PRN
  Filled 2023-06-24: qty 120, 4d supply, fill #0

## 2023-06-24 MED ORDER — DILTIAZEM HCL ER COATED BEADS 120 MG PO CP24: 120.0000 mg | ORAL_CAPSULE | Freq: Every day | ORAL | 3 refills | Status: DC

## 2023-06-24 MED ORDER — POLYETHYLENE GLYCOL 3350 17 GM/SCOOP PO POWD
238.0000 g | Freq: Every day | ORAL | 0 refills | Status: DC
  Filled 2023-06-24: qty 238, 1d supply, fill #0

## 2023-06-24 MED ORDER — GUAIFENESIN 100 MG/5ML PO LIQD: 5.0000 mL | ORAL | 0 refills | Status: DC | PRN

## 2023-06-24 MED ORDER — BENZONATATE 100 MG PO CAPS: 100.0000 mg | ORAL_CAPSULE | Freq: Three times a day (TID) | ORAL | 0 refills | Status: DC | PRN

## 2023-06-24 MED ORDER — METOPROLOL TARTRATE 100 MG PO TABS
100.0000 mg | ORAL_TABLET | Freq: Two times a day (BID) | ORAL | 3 refills | Status: DC
  Filled 2023-06-24: qty 60, 30d supply, fill #0

## 2023-06-24 NOTE — Progress Notes (Signed)
AVS reviewed w/ pt & daughter - no other questions at this time. Coumadin given by primary RN, pt to self administer lovenox at home at [facility]. ASA still on d/c AVS - pt to check w/ Coumadin clinic. Pt was not taking this med prior to admission. PIV x 2 removed as noted. Pt dressing for d/c to home.

## 2023-06-24 NOTE — Discharge Summary (Signed)
by mouth 2 (two) times daily. What changed: additional instructions   Vitamin D3 50 MCG (2000 UT) Tabs Take 2,000 Units by mouth in the morning.   warfarin 5 MG tablet Commonly known as: COUMADIN Take as directed. If you are unsure how to take this medication, talk to your nurse or doctor. Original instructions: TAKE 1 TO 1 & 1/2 (ONE & ONE-HALF) TABLETS BY MOUTH ONCE DAILY What changed:  how much to take how to take this when to take this additional instructions        Follow-up Information     Marguerita Beards, MD. Go on 06/29/2023.   Specialty: Obstetrics and Gynecology Why: 11:00 AM for foley removal Contact information: [address] [place] [address] [phone]         Christell Constant, MD. Schedule an appointment as soon as possible for a visit in 2 week(s).   Specialty: Cardiology Why: for hospital follow-up Contact information: [address] [place] [address] [place] [address] [phone]                Discharge Exam: Ceasar Mons Weights   06/13/23 1554 06/20/23 1027  Weight: 56.2 kg 56.7 kg   S: Wants to go home today, feeling better.  Ambulated with PT and cleared to discharge.  BP 92/64 (BP Location: Left Arm)   Pulse 92   Temp 98.6 F (37 C) (Oral)   Resp (!) 24   Ht 5\' 1"  (1.549 m)   Wt 56.7 kg   SpO2 98%   BMI 23.64 kg/m   Physical Exam General: Alert and oriented x 3, NAD Cardiovascular: S1 S2 clear, RRR.  Respiratory: CTAB, no wheezing, rales or rhonchi Gastrointestinal: Soft, nontender, nondistended, NBS Ext: no pedal edema bilaterally Neuro: no new deficits Psych: Normal affect    Condition at discharge: fair  The results of significant diagnostics from this hospitalization (including imaging, microbiology, ancillary and laboratory) are listed below for reference.   Imaging Studies: ECHOCARDIOGRAM COMPLETE  Result Date: 06/24/2023    ECHOCARDIOGRAM REPORT   Patient Name:   Hannah Downs Date of Exam: 06/23/2023 Medical Rec #:  [id number]           Height:       61.0 in Accession #:    [id number]          Weight:       125.1 lb Date of Birth:  09-23-1945           BSA:          1.547 m Patient Age:    77 years            BP:           106/87 mmHg Patient Gender: F                   HR:           87 bpm. Exam Location:  Inpatient Procedure: 2D Echo, Cardiac Doppler and Color Doppler Indications:    CHF-Acute Diastolic I50.31  History:        Patient has prior history of Echocardiogram examinations, most                 recent 11/08/2021. CHF, Stroke, Arrythmias:Atrial Fibrillation,                 Signs/Symptoms:Hypotension and Dyspnea; Risk                 Factors:Hypertension  Physician Discharge Summary   Patient: Hannah Downs MRN: [id number] DOB: December 21, 1945  Admit date:     06/20/2023  Discharge date: 06/24/23  Discharge Physician: Thad Ranger, MD    PCP: [name], MD   Recommendations at discharge:   Patient will continue Lovenox, as bridged with Coumadin, has appointment on 06/27/2023.  If INR above 2, then discontinue Lovenox and continue with Coumadin per recommendations from A-fib clinic. Continue salt tabs 2 g twice daily for 5 days with fluid restriction for 5 days Please check PT/INR, BMET on Monday, 06/27/2023 Discontinued HCTZ.  Discharge Diagnoses:    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 organs   Hx of stroke without residual deficits   Paroxysmal atrial fibrillation with RVR (HCC)   Acute on chronic diastolic CHF (congestive heart failure) (HCC)   Essential hypertension   Hyperlipidemia   Hyponatremia   Gastroesophageal reflux disease without esophagitis   Hypotension     Hospital Course:  Patient is a 77 year old female with history of paroxysmal atrial fibrillation, recurrent CVA, on Coumadin who underwent pelvic organ prolapse repair on 10/7. Postoperatively developed atrial fibrillation with RVR, hypotension and TRH was consulted for medical management.    Assessment and Plan:   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 organs -Underwent LeForte Colpocleisis, levator plication, perineorrhaphy, cystoscopy, excision of urethral prolapse  -Per GYN, no current concerns, healing well postoperatively, keep Foley due to removal of urethral prolapse, continue on discharge and will remove next week in office.      Paroxysmal atrial fibrillation with RVR (HCC) -Cardiology was consulted, received Lasix 20 mg IV x 1 on 10/9  -Placed on Cardizem, Lopressor -Placed on IV heparin drip and Coumadin.  Patient has Lovenox at home for bridge and will continue this weekend.  She has an appointment on 06/27/2023 on Monday, if INR above 2, she will discontinue  Lovenox and continue Coumadin per recommendations from A-fib clinic.   Acute diastolic CHF -Reported chest heaviness, DOE, likely due to paroxysmal A-fib with RVR  -BNP 661, received Lasix 20 mg IV x 1 on 10/9 2D echo showed EF of 60 to 65%, no regional wall motion abnormalities, moderately elevated PA systolic pressure, 45.0 mmHg.  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 -Patient has an appointment with cardiology on 10/22 for follow-up.       Hx of stroke without residual deficits -History of prior embolic strokes in 2022 in 2023 while on DOAC -Continue Lovenox bridge with Coumadin until INR above 2   Acute on chronic hyponatremia -Likely due to volume overload with lower extremity edema, DOE -Serum osmolarity 262, urine osmolarity 260, urine Na 94 -Received Lasix 20 mg IV x 1 on 10/9, with no significant improvement -Sodium neutral trend down lowest to 119 on 10/10, nephrology was consulted.  Patient received 1 dose of tolvaptan -Sodium has improved to 135.  Per nephrology mentations, discharge on salt tablets 2 g twice daily for 5 days with fluid restriction -Patient needs to have BMP checked on Monday, 06/27/2023      Essential hypertension -BP currently stable, discontinued HCTZ at discharge due to marked hyponatremia       Hyperlipidemia -Continue statin   Estimated body mass index is 23.64 kg/m as calculated from the following:   Height as of this encounter: 5\' 1"  (1.549 m).   Weight as of this encounter: 56.7 kg.     Pain control - [employer] Controlled Substance Reporting System database was reviewed. and patient was instructed, not to drive, operate  Physician Discharge Summary   Patient: Hannah Downs MRN: [id number] DOB: December 21, 1945  Admit date:     06/20/2023  Discharge date: 06/24/23  Discharge Physician: Thad Ranger, MD    PCP: [name], MD   Recommendations at discharge:   Patient will continue Lovenox, as bridged with Coumadin, has appointment on 06/27/2023.  If INR above 2, then discontinue Lovenox and continue with Coumadin per recommendations from A-fib clinic. Continue salt tabs 2 g twice daily for 5 days with fluid restriction for 5 days Please check PT/INR, BMET on Monday, 06/27/2023 Discontinued HCTZ.  Discharge Diagnoses:    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 organs   Hx of stroke without residual deficits   Paroxysmal atrial fibrillation with RVR (HCC)   Acute on chronic diastolic CHF (congestive heart failure) (HCC)   Essential hypertension   Hyperlipidemia   Hyponatremia   Gastroesophageal reflux disease without esophagitis   Hypotension     Hospital Course:  Patient is a 77 year old female with history of paroxysmal atrial fibrillation, recurrent CVA, on Coumadin who underwent pelvic organ prolapse repair on 10/7. Postoperatively developed atrial fibrillation with RVR, hypotension and TRH was consulted for medical management.    Assessment and Plan:   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 organs -Underwent LeForte Colpocleisis, levator plication, perineorrhaphy, cystoscopy, excision of urethral prolapse  -Per GYN, no current concerns, healing well postoperatively, keep Foley due to removal of urethral prolapse, continue on discharge and will remove next week in office.      Paroxysmal atrial fibrillation with RVR (HCC) -Cardiology was consulted, received Lasix 20 mg IV x 1 on 10/9  -Placed on Cardizem, Lopressor -Placed on IV heparin drip and Coumadin.  Patient has Lovenox at home for bridge and will continue this weekend.  She has an appointment on 06/27/2023 on Monday, if INR above 2, she will discontinue  Lovenox and continue Coumadin per recommendations from A-fib clinic.   Acute diastolic CHF -Reported chest heaviness, DOE, likely due to paroxysmal A-fib with RVR  -BNP 661, received Lasix 20 mg IV x 1 on 10/9 2D echo showed EF of 60 to 65%, no regional wall motion abnormalities, moderately elevated PA systolic pressure, 45.0 mmHg.  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 -Patient has an appointment with cardiology on 10/22 for follow-up.       Hx of stroke without residual deficits -History of prior embolic strokes in 2022 in 2023 while on DOAC -Continue Lovenox bridge with Coumadin until INR above 2   Acute on chronic hyponatremia -Likely due to volume overload with lower extremity edema, DOE -Serum osmolarity 262, urine osmolarity 260, urine Na 94 -Received Lasix 20 mg IV x 1 on 10/9, with no significant improvement -Sodium neutral trend down lowest to 119 on 10/10, nephrology was consulted.  Patient received 1 dose of tolvaptan -Sodium has improved to 135.  Per nephrology mentations, discharge on salt tablets 2 g twice daily for 5 days with fluid restriction -Patient needs to have BMP checked on Monday, 06/27/2023      Essential hypertension -BP currently stable, discontinued HCTZ at discharge due to marked hyponatremia       Hyperlipidemia -Continue statin   Estimated body mass index is 23.64 kg/m as calculated from the following:   Height as of this encounter: 5\' 1"  (1.549 m).   Weight as of this encounter: 56.7 kg.     Pain control - [employer] Controlled Substance Reporting System database was reviewed. and patient was instructed, not to drive, operate  Physician Discharge Summary   Patient: Hannah Downs MRN: [id number] DOB: December 21, 1945  Admit date:     06/20/2023  Discharge date: 06/24/23  Discharge Physician: Thad Ranger, MD    PCP: [name], MD   Recommendations at discharge:   Patient will continue Lovenox, as bridged with Coumadin, has appointment on 06/27/2023.  If INR above 2, then discontinue Lovenox and continue with Coumadin per recommendations from A-fib clinic. Continue salt tabs 2 g twice daily for 5 days with fluid restriction for 5 days Please check PT/INR, BMET on Monday, 06/27/2023 Discontinued HCTZ.  Discharge Diagnoses:    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 organs   Hx of stroke without residual deficits   Paroxysmal atrial fibrillation with RVR (HCC)   Acute on chronic diastolic CHF (congestive heart failure) (HCC)   Essential hypertension   Hyperlipidemia   Hyponatremia   Gastroesophageal reflux disease without esophagitis   Hypotension     Hospital Course:  Patient is a 77 year old female with history of paroxysmal atrial fibrillation, recurrent CVA, on Coumadin who underwent pelvic organ prolapse repair on 10/7. Postoperatively developed atrial fibrillation with RVR, hypotension and TRH was consulted for medical management.    Assessment and Plan:   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 organs -Underwent LeForte Colpocleisis, levator plication, perineorrhaphy, cystoscopy, excision of urethral prolapse  -Per GYN, no current concerns, healing well postoperatively, keep Foley due to removal of urethral prolapse, continue on discharge and will remove next week in office.      Paroxysmal atrial fibrillation with RVR (HCC) -Cardiology was consulted, received Lasix 20 mg IV x 1 on 10/9  -Placed on Cardizem, Lopressor -Placed on IV heparin drip and Coumadin.  Patient has Lovenox at home for bridge and will continue this weekend.  She has an appointment on 06/27/2023 on Monday, if INR above 2, she will discontinue  Lovenox and continue Coumadin per recommendations from A-fib clinic.   Acute diastolic CHF -Reported chest heaviness, DOE, likely due to paroxysmal A-fib with RVR  -BNP 661, received Lasix 20 mg IV x 1 on 10/9 2D echo showed EF of 60 to 65%, no regional wall motion abnormalities, moderately elevated PA systolic pressure, 45.0 mmHg.  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 -Patient has an appointment with cardiology on 10/22 for follow-up.       Hx of stroke without residual deficits -History of prior embolic strokes in 2022 in 2023 while on DOAC -Continue Lovenox bridge with Coumadin until INR above 2   Acute on chronic hyponatremia -Likely due to volume overload with lower extremity edema, DOE -Serum osmolarity 262, urine osmolarity 260, urine Na 94 -Received Lasix 20 mg IV x 1 on 10/9, with no significant improvement -Sodium neutral trend down lowest to 119 on 10/10, nephrology was consulted.  Patient received 1 dose of tolvaptan -Sodium has improved to 135.  Per nephrology mentations, discharge on salt tablets 2 g twice daily for 5 days with fluid restriction -Patient needs to have BMP checked on Monday, 06/27/2023      Essential hypertension -BP currently stable, discontinued HCTZ at discharge due to marked hyponatremia       Hyperlipidemia -Continue statin   Estimated body mass index is 23.64 kg/m as calculated from the following:   Height as of this encounter: 5\' 1"  (1.549 m).   Weight as of this encounter: 56.7 kg.     Pain control - [employer] Controlled Substance Reporting System database was reviewed. and patient was instructed, not to drive, operate  Physician Discharge Summary   Patient: Hannah Downs MRN: [id number] DOB: December 21, 1945  Admit date:     06/20/2023  Discharge date: 06/24/23  Discharge Physician: Thad Ranger, MD    PCP: [name], MD   Recommendations at discharge:   Patient will continue Lovenox, as bridged with Coumadin, has appointment on 06/27/2023.  If INR above 2, then discontinue Lovenox and continue with Coumadin per recommendations from A-fib clinic. Continue salt tabs 2 g twice daily for 5 days with fluid restriction for 5 days Please check PT/INR, BMET on Monday, 06/27/2023 Discontinued HCTZ.  Discharge Diagnoses:    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 organs   Hx of stroke without residual deficits   Paroxysmal atrial fibrillation with RVR (HCC)   Acute on chronic diastolic CHF (congestive heart failure) (HCC)   Essential hypertension   Hyperlipidemia   Hyponatremia   Gastroesophageal reflux disease without esophagitis   Hypotension     Hospital Course:  Patient is a 77 year old female with history of paroxysmal atrial fibrillation, recurrent CVA, on Coumadin who underwent pelvic organ prolapse repair on 10/7. Postoperatively developed atrial fibrillation with RVR, hypotension and TRH was consulted for medical management.    Assessment and Plan:   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 organs -Underwent LeForte Colpocleisis, levator plication, perineorrhaphy, cystoscopy, excision of urethral prolapse  -Per GYN, no current concerns, healing well postoperatively, keep Foley due to removal of urethral prolapse, continue on discharge and will remove next week in office.      Paroxysmal atrial fibrillation with RVR (HCC) -Cardiology was consulted, received Lasix 20 mg IV x 1 on 10/9  -Placed on Cardizem, Lopressor -Placed on IV heparin drip and Coumadin.  Patient has Lovenox at home for bridge and will continue this weekend.  She has an appointment on 06/27/2023 on Monday, if INR above 2, she will discontinue  Lovenox and continue Coumadin per recommendations from A-fib clinic.   Acute diastolic CHF -Reported chest heaviness, DOE, likely due to paroxysmal A-fib with RVR  -BNP 661, received Lasix 20 mg IV x 1 on 10/9 2D echo showed EF of 60 to 65%, no regional wall motion abnormalities, moderately elevated PA systolic pressure, 45.0 mmHg.  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 -Patient has an appointment with cardiology on 10/22 for follow-up.       Hx of stroke without residual deficits -History of prior embolic strokes in 2022 in 2023 while on DOAC -Continue Lovenox bridge with Coumadin until INR above 2   Acute on chronic hyponatremia -Likely due to volume overload with lower extremity edema, DOE -Serum osmolarity 262, urine osmolarity 260, urine Na 94 -Received Lasix 20 mg IV x 1 on 10/9, with no significant improvement -Sodium neutral trend down lowest to 119 on 10/10, nephrology was consulted.  Patient received 1 dose of tolvaptan -Sodium has improved to 135.  Per nephrology mentations, discharge on salt tablets 2 g twice daily for 5 days with fluid restriction -Patient needs to have BMP checked on Monday, 06/27/2023      Essential hypertension -BP currently stable, discontinued HCTZ at discharge due to marked hyponatremia       Hyperlipidemia -Continue statin   Estimated body mass index is 23.64 kg/m as calculated from the following:   Height as of this encounter: 5\' 1"  (1.549 m).   Weight as of this encounter: 56.7 kg.     Pain control - [employer] Controlled Substance Reporting System database was reviewed. and patient was instructed, not to drive, operate  by mouth 2 (two) times daily. What changed: additional instructions   Vitamin D3 50 MCG (2000 UT) Tabs Take 2,000 Units by mouth in the morning.   warfarin 5 MG tablet Commonly known as: COUMADIN Take as directed. If you are unsure how to take this medication, talk to your nurse or doctor. Original instructions: TAKE 1 TO 1 & 1/2 (ONE & ONE-HALF) TABLETS BY MOUTH ONCE DAILY What changed:  how much to take how to take this when to take this additional instructions        Follow-up Information     Marguerita Beards, MD. Go on 06/29/2023.   Specialty: Obstetrics and Gynecology Why: 11:00 AM for foley removal Contact information: [address] [place] [address] [phone]         Christell Constant, MD. Schedule an appointment as soon as possible for a visit in 2 week(s).   Specialty: Cardiology Why: for hospital follow-up Contact information: [address] [place] [address] [place] [address] [phone]                Discharge Exam: Ceasar Mons Weights   06/13/23 1554 06/20/23 1027  Weight: 56.2 kg 56.7 kg   S: Wants to go home today, feeling better.  Ambulated with PT and cleared to discharge.  BP 92/64 (BP Location: Left Arm)   Pulse 92   Temp 98.6 F (37 C) (Oral)   Resp (!) 24   Ht 5\' 1"  (1.549 m)   Wt 56.7 kg   SpO2 98%   BMI 23.64 kg/m   Physical Exam General: Alert and oriented x 3, NAD Cardiovascular: S1 S2 clear, RRR.  Respiratory: CTAB, no wheezing, rales or rhonchi Gastrointestinal: Soft, nontender, nondistended, NBS Ext: no pedal edema bilaterally Neuro: no new deficits Psych: Normal affect    Condition at discharge: fair  The results of significant diagnostics from this hospitalization (including imaging, microbiology, ancillary and laboratory) are listed below for reference.   Imaging Studies: ECHOCARDIOGRAM COMPLETE  Result Date: 06/24/2023    ECHOCARDIOGRAM REPORT   Patient Name:   Hannah Downs Date of Exam: 06/23/2023 Medical Rec #:  [id number]           Height:       61.0 in Accession #:    [id number]          Weight:       125.1 lb Date of Birth:  09-23-1945           BSA:          1.547 m Patient Age:    77 years            BP:           106/87 mmHg Patient Gender: F                   HR:           87 bpm. Exam Location:  Inpatient Procedure: 2D Echo, Cardiac Doppler and Color Doppler Indications:    CHF-Acute Diastolic I50.31  History:        Patient has prior history of Echocardiogram examinations, most                 recent 11/08/2021. CHF, Stroke, Arrythmias:Atrial Fibrillation,                 Signs/Symptoms:Hypotension and Dyspnea; Risk                 Factors:Hypertension

## 2023-06-24 NOTE — Plan of Care (Signed)
  Problem: Education: Goal: Knowledge of the prescribed therapeutic regimen will improve Outcome: Progressing   Problem: Self-Concept: Goal: Communication of feelings regarding changes in body function or appearance will improve Outcome: Progressing

## 2023-06-24 NOTE — Evaluation (Signed)
Physical Therapy Evaluation Patient Details Name: Hannah Downs MRN: [id number] DOB: 08/31/1946 Today's Date: 06/24/2023  History of Present Illness  77 y.o. female admitted 06/20/23 with female organ prolapse, s/p LeForte Colpocleisis, levator plication, perineorrhaphy, cystoscopy, excision of urethral prolapse. Admitted for rate control of atrial fibrillation.  Clinical Impression  Pt is mobilizing well at a modified independent level. She ambulated 100' with a RW without loss of balance, HR 105 while walking, HR 90 at rest. Pt denied dizziness throughout PT session. She is ready to DC home from a PT standpoint. No further PT indicated, will sign off.         If plan is discharge home, recommend the following: Assist for transportation;Help with stairs or ramp for entrance;Assistance with cooking/housework   Can travel by private vehicle        Equipment Recommendations None recommended by PT  Recommendations for Other Services       Functional Status Assessment Patient has not had a recent decline in their functional status     Precautions / Restrictions Precautions Precautions: Fall Precaution Comments: denies falls in the past 6 months Restrictions Weight Bearing Restrictions: No      Mobility  Bed Mobility               General bed mobility comments: up in recliner    Transfers Overall transfer level: Independent                      Ambulation/Gait Ambulation/Gait assistance: Supervision Gait Distance (Feet): 100 Feet Assistive device: Rolling walker (2 wheels) Gait Pattern/deviations: WFL(Within Functional Limits) Gait velocity: WNL     General Gait Details: VCs for positioning in RW, HR 105 with ambulation, HR 90 at rest, pt denied dizziness  Stairs            Wheelchair Mobility     Tilt Bed    Modified Rankin (Stroke Patients Only)       Balance Overall balance assessment: Modified Independent                                            Pertinent Vitals/Pain Pain Assessment Pain Assessment: Faces Faces Pain Scale: Hurts little more Pain Location: perineum Pain Descriptors / Indicators: Sore Pain Intervention(s): Limited activity within patient's tolerance, Monitored during session    Home Living Family/patient expects to be discharged to:: Private residence Living Arrangements: Spouse/significant other;Children Available Help at Discharge: Family;Available 24 hours/day Type of Home: House Home Access: Stairs to enter Entrance Stairs-Rails: Right Entrance Stairs-Number of Steps: 4   Home Layout: One level Home Equipment: Educational psychologist (4 wheels)      Prior Function Prior Level of Function : Independent/Modified Independent             Mobility Comments: walks without AD, no falls in past 6 months ADLs Comments: independent     Extremity/Trunk Assessment   Upper Extremity Assessment Upper Extremity Assessment: Overall WFL for tasks assessed    Lower Extremity Assessment Lower Extremity Assessment: Overall WFL for tasks assessed    Cervical / Trunk Assessment Cervical / Trunk Assessment: Normal  Communication   Communication Communication: No apparent difficulties  Cognition Arousal: Alert Behavior During Therapy: WFL for tasks assessed/performed Overall Cognitive Status: Within Functional Limits for tasks assessed  General Comments      Exercises     Assessment/Plan    PT Assessment Patient does not need any further PT services  PT Problem List         PT Treatment Interventions      PT Goals (Current goals can be found in the Care Plan section)  Acute Rehab PT Goals Patient Stated Goal: walk her beagle PT Goal Formulation: All assessment and education complete, DC therapy    Frequency       Co-evaluation               AM-PAC PT "6 Clicks" Mobility  Outcome  Measure Help needed turning from your back to your side while in a flat bed without using bedrails?: None Help needed moving from lying on your back to sitting on the side of a flat bed without using bedrails?: None Help needed moving to and from a bed to a chair (including a wheelchair)?: None Help needed standing up from a chair using your arms (e.g., wheelchair or bedside chair)?: None Help needed to walk in hospital room?: None Help needed climbing 3-5 steps with a railing? : A Little 6 Click Score: 23    End of Session   Activity Tolerance: Patient tolerated treatment well Patient left: in chair;with call bell/phone within reach;with family/visitor present Nurse Communication: Mobility status      Time: 1449-1500 PT Time Calculation (min) (ACUTE ONLY): 11 min   Charges:   PT Evaluation $PT Eval Low Complexity: 1 Low   PT General Charges $$ ACUTE PT VISIT: 1 Visit         Tamala Ser PT 06/24/2023  Acute Rehabilitation Services  Office [phone]

## 2023-06-24 NOTE — Progress Notes (Addendum)
[facility] Progress Note  Subjective: UOP 1700 yest and 1200 today so far. Na+ up to 129 and 134 this am.   Vitals:   06/23/23 0624 06/23/23 1324 06/23/23 2132 06/24/23 0354  BP: 114/72 106/87 126/84 100/72  Pulse: 84 81 66 87  Resp:  (!) 24 18 18   Temp:  98.5 F (36.9 C) 98.3 F (36.8 C) 98.7 F (37.1 C)  TempSrc:  Oral Oral Oral  SpO2:  100% 96% 96%  Weight:      Height:        Exam: Gen alert, no distress No rash, cyanosis or gangrene Sclera anicteric, throat clear  No jvd or bruits Chest clear bilat to bases, no rales/ wheezing RRR no MRG Abd soft ntnd no mass or ascites +bs GU foley draining moderately dark urine MS no joint effusions or deformity Ext no pitting LE or UE edema, no wounds or ulcers Neuro is alert, Ox 3 , nf      Renal-related home meds: - hydrochlorothiazide 25 qd - klorcon - advil - metoprolol xl 150 every day - others: warfarin, prilosec, extrace, lovenox, asa, lipitor       UNa - 94   UOsm - 260     TSH 1.32      CXR - no edema   Assessment/ Plan: Hyponatremia - Na+ was 126 on admission 4 days ago and then dropped down to 120, in the setting of elective surgery for vaginal prolapse done on 10/07. Risk factors for hyponatremia include post surgery pain and nausea, and hydrochlorothiazide use. No signs of CHF, cirrhosis or renal faliure. Cause if SIADH related to thiazide medication + post op pain / nausea. Would avoid thiazide diuretics for now. Gave tolvaptan 15 mg x 1 yesterday. Na+ up to 129/ 134 this am.  Pt can be dc'd home. Would give her salt tabs 2 gm bid and fluid restriction (32 oz/d) both for 5 days at home then stop. Will sign off.  SP repair of vaginal prolapse HTN - on BB only Afib / RVR - new onset, post -op Mild LE edema - possible acute HFpEF d/t afib. She rec'd IV lasix 10/9 x 1.        Vinson Moselle MD  [facility] 06/24/2023, 8:21 AM  Recent Labs  Lab 06/23/23 0424 06/23/23 0846 06/23/23 1212  06/24/23 0639  HGB 10.4*  --   --  11.9*  ALBUMIN  --   --  3.6  --   CALCIUM  --  8.1*  --  9.3  CREATININE  --  0.43*  --  0.49  K  --  3.6  --  3.9   No results for input(s): "IRON", "TIBC", "FERRITIN" in the last 168 hours. Inpatient medications:  atorvastatin  20 mg Oral Daily   benzonatate  100 mg Oral TID   Chlorhexidine Gluconate Cloth  6 each Topical Daily   cholecalciferol  2,000 Units Oral q AM   diltiazem  120 mg Oral Daily   loratadine  10 mg Oral Daily   metoprolol tartrate  100 mg Oral BID   omeprazole  20 mg Oral Daily   polyethylene glycol  17 g Oral Daily   potassium chloride SA  10 mEq Oral Daily   trospium  20 mg Oral BID   Warfarin - Pharmacist Dosing Inpatient   Does not apply q1600    heparin 950 Units/hr (06/24/23 0357)   acetaminophen, guaiFENesin, menthol-cetylpyridinium, ondansetron **OR** ondansetron (ZOFRAN) IV, simethicone, traMADol

## 2023-06-24 NOTE — Progress Notes (Signed)
OT Cancellation Note  Patient Details Name: Hannah Downs MRN: [id number] DOB: Feb 04, 1946   Cancelled Treatment:    Reason Eval/Treat Not Completed: OT screened, no needs identified, will sign off Pt is functioning at or very close to baseline.   Limmie Patricia, OTR/L,CBIS  Supplemental OT - [facility] and [facility] Secure Chat Preferred   06/24/2023, 3:22 PM

## 2023-06-24 NOTE — Consult Note (Signed)
PHARMACY - ANTICOAGULATION CONSULT NOTE  Pharmacy Consult for heparin & warfarin Indication: atrial fibrillation  Allergies  Allergen Reactions   Lisinopril Cough   Thiazide-Type Diuretics     hyponatremia    Patient Measurements: Height: 5\' 1"  (154.9 cm) Weight: 56.7 kg (125 lb 1.6 oz) IBW/kg (Calculated) : 47.8 Heparin Dosing Weight: 56.7 kg  Vital Signs: Temp: 98.7 F (37.1 C) (10/11 0354) Temp Source: Oral (10/11 0354) BP: 100/72 (10/11 0354) Pulse Rate: 87 (10/11 0354)  Labs: Recent Labs    06/22/23 0409 06/22/23 1255 06/22/23 1927 06/23/23 0424 06/23/23 0846 06/24/23 0639  HGB 10.9*  --   --  10.4*  --  11.9*  HCT 31.6*  --   --  29.6*  --  34.7*  PLT 219  --   --  218  --  285  LABPROT 14.3  --   --  16.0*  --  17.0*  INR 1.1  --   --  1.3*  --  1.4*  HEPARINUNFRC 0.20* 0.37  --  0.29*  --  0.40  CREATININE 0.47  --  0.48  --  0.43* 0.49    Estimated Creatinine Clearance: 44.4 mL/min (by C-G formula based on SCr of 0.49 mg/dL).   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   neurologist--- dr Pearlean Brownie;   11-06-2021  left frontoparietal cortical embolic infarct secondary AFib whilie off anticoagulant for emergent surgery then  post surgery 11-12-2022 on eliquis , acute right cerebellar PICA territory infarct ;  pt now on chronic coumadin, managed by clinic   Hyperlipidemia    Hypertension    Lumbar degenerative disc disease    Osteopenia    PAF (paroxysmal atrial fibrillation) (HCC) 2021   cardiologist-- dr Melburn Popper;  evaluted event monitor 03-26-2020 showed 1% burden  Afib   Presence of pessary    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 Home dose is 5 mg every day except 7.5 mg on Tuesdays. Warfarin held 5 days prior to surgery and patient bridged on 60 mg BID Lovenox. Post-op patient developed Afib with RVR and hypotension. Patient is high risk for recurrent stroke due to hx of CVA with missed anticoagulation. Patient currently being bridged with heparin, pharmacy is being consulted to restart warfarin.    06/24/23 Heparin level = 0.40 (therapeutic) with heparin gtt @ 950 units/hr INR = 1.4 Hgb = 11.9, Pltc 285 No complications of therapy noted    Goal of Therapy:  INR 2-3 Heparin level 0.3-0.7 units/ml Monitor platelets by anticoagulation protocol: Yes   Plan:  - Continue heparin infusion to 950 units/hr - Warfarin 7.5mg  po x 1 dose today - Daily heparin level, CBC, and INR with AM labs  - Monitor for new or worsening bleeding given she is post-op  Karlton Lemon, PharmD Candidate 06/24/2023 8:03 AM

## 2023-06-24 NOTE — Progress Notes (Signed)
1405 -Pt's BP low when checked by nurse tech - SBP 87-89. Pt sitting at the time- denies chest pain, SOB or dizziness. Dr Isidoro Donning contacted - ortho static VS ordered. BP done on left arm at 1409: Supine: 100/80 HR 75 Sitting: 103/77 HR 91 Standing 101/69 HR 72 Pt also asked if echo had been read- no final results yet per Dr Isidoro Donning. Discharge on hold. This RN updated primary RN Lurena Joiner)

## 2023-06-27 ENCOUNTER — Ambulatory Visit: Payer: Medicare Other | Attending: Cardiology

## 2023-06-27 ENCOUNTER — Telehealth: Payer: Self-pay

## 2023-06-27 DIAGNOSIS — I4891 Unspecified atrial fibrillation: Secondary | ICD-10-CM

## 2023-06-27 DIAGNOSIS — I480 Paroxysmal atrial fibrillation: Secondary | ICD-10-CM

## 2023-06-27 DIAGNOSIS — Z5181 Encounter for therapeutic drug level monitoring: Secondary | ICD-10-CM | POA: Diagnosis not present

## 2023-06-27 LAB — POCT INR: INR: 2.2 (ref 2.0–3.0)

## 2023-06-27 NOTE — Telephone Encounter (Signed)
I spoke with patient's daughter and advised her to give Lovenox injection this morning and we will check for therapeutic INR this afternoon.  She verbalized understanding

## 2023-06-27 NOTE — Patient Instructions (Signed)
Continue taking warfarin 1 tablet daily except for 1.5 tablets on Tuesdays. Recheck INR in 2 weeks.  [phone]

## 2023-06-27 NOTE — Telephone Encounter (Signed)
Called and spoke to patient: She reports her pain is increased when she first sits down but it is manageable.  Patient does have a catheter and is coming on 10/16 for voiding trial.  Endorses a bowel movement today and states she is taking fiber to assist with her bowel movements.  States she has still had some oozing blood but nothing concerning. States her INR today was 2.2 Patient reports she has been a little overwhelmed with the number of follow ups she has regarding cardiology but is otherwise doing well.  Patient is overall doing well and reports she will call and let us know if she needs anything.

## 2023-06-29 ENCOUNTER — Ambulatory Visit (INDEPENDENT_AMBULATORY_CARE_PROVIDER_SITE_OTHER): Payer: Medicare Other

## 2023-06-29 DIAGNOSIS — Z48816 Encounter for surgical aftercare following surgery on the genitourinary system: Secondary | ICD-10-CM

## 2023-06-29 NOTE — Progress Notes (Signed)
Urogyn Nurse Voiding Trial Note  Hannah Downs underwent  LEFORTE COLPOCLEISIS; LEVATOR PLICATION WITH PERINEORRHAPHY  on 06-20-2023.  She presents today for a voiding trial .   Patient was identified with 2 indicators. of NS was instilled into the bladder via the catheter. The catheter was removed and patient was instructed to void into the urinary hat. She voided . The post void residual measured by bladder scan was 0 ml. She passed the voiding trial and a catheter was not replaced.   The patient received aftercare instructions and will follow up as scheduled.

## 2023-06-29 NOTE — Patient Instructions (Signed)
Keep all post op appointments

## 2023-07-04 ENCOUNTER — Other Ambulatory Visit: Payer: Self-pay | Admitting: Cardiovascular Disease

## 2023-07-04 DIAGNOSIS — I480 Paroxysmal atrial fibrillation: Secondary | ICD-10-CM

## 2023-07-05 ENCOUNTER — Ambulatory Visit (HOSPITAL_COMMUNITY): Payer: Medicare Other | Admitting: Internal Medicine

## 2023-07-11 ENCOUNTER — Ambulatory Visit: Payer: Medicare Other | Attending: Cardiovascular Disease

## 2023-07-11 DIAGNOSIS — Z5181 Encounter for therapeutic drug level monitoring: Secondary | ICD-10-CM | POA: Diagnosis not present

## 2023-07-11 DIAGNOSIS — I480 Paroxysmal atrial fibrillation: Secondary | ICD-10-CM

## 2023-07-11 DIAGNOSIS — I4891 Unspecified atrial fibrillation: Secondary | ICD-10-CM

## 2023-07-11 LAB — POCT INR: INR: 2.5 (ref 2.0–3.0)

## 2023-07-11 NOTE — Patient Instructions (Signed)
Continue taking warfarin 1 tablet daily except for 1.5 tablets on Tuesdays. Recheck INR in 4 weeks.  [phone]

## 2023-07-18 ENCOUNTER — Ambulatory Visit: Payer: Medicare Other | Admitting: Obstetrics and Gynecology

## 2023-07-19 ENCOUNTER — Encounter: Payer: Self-pay | Admitting: Physician Assistant

## 2023-07-19 ENCOUNTER — Telehealth: Payer: Self-pay

## 2023-07-19 ENCOUNTER — Ambulatory Visit: Payer: Medicare Other | Attending: Physician Assistant | Admitting: Physician Assistant

## 2023-07-19 VITALS — BP 112/88 | HR 112 | Ht 60.0 in | Wt 125.0 lb

## 2023-07-19 DIAGNOSIS — I10 Essential (primary) hypertension: Secondary | ICD-10-CM | POA: Diagnosis not present

## 2023-07-19 DIAGNOSIS — I5032 Chronic diastolic (congestive) heart failure: Secondary | ICD-10-CM | POA: Insufficient documentation

## 2023-07-19 DIAGNOSIS — E871 Hypo-osmolality and hyponatremia: Secondary | ICD-10-CM | POA: Diagnosis not present

## 2023-07-19 DIAGNOSIS — R072 Precordial pain: Secondary | ICD-10-CM | POA: Insufficient documentation

## 2023-07-19 DIAGNOSIS — I2729 Other secondary pulmonary hypertension: Secondary | ICD-10-CM | POA: Insufficient documentation

## 2023-07-19 DIAGNOSIS — I480 Paroxysmal atrial fibrillation: Secondary | ICD-10-CM | POA: Diagnosis not present

## 2023-07-19 DIAGNOSIS — I340 Nonrheumatic mitral (valve) insufficiency: Secondary | ICD-10-CM | POA: Insufficient documentation

## 2023-07-19 MED ORDER — DILTIAZEM HCL ER COATED BEADS 120 MG PO CP24: 120.0000 mg | ORAL_CAPSULE | Freq: Two times a day (BID) | ORAL | 3 refills | Status: DC

## 2023-07-19 NOTE — Patient Instructions (Addendum)
Medication Instructions:  Your physician has recommended you make the following change in your medication:   INCREASE the Cardizem to 120 mg twice a day  *If you need a refill on your cardiac medications before your next appointment, please call your pharmacy*   Lab Work: TODAY:  BMET  If you have labs (blood work) drawn today and your tests are completely normal, you will receive your results only by: MyChart Message (if you have MyChart) OR A paper copy in the mail If you have any lab test that is abnormal or we need to change your treatment, we will call you to review the results.   Testing/Procedures: None ordered    Follow-Up: At [facility], you and your health needs are our priority.  As part of our continuing mission to provide you with exceptional heart care, we have created designated Provider Care Teams.  These Care Teams include your primary Cardiologist (physician) and Advanced Practice Providers (APPs -  Physician Assistants and Nurse Practitioners) who all work together to provide you with the care you need, when you need it.  We recommend signing up for the patient portal called "MyChart".  Sign up information is provided on this After Visit Summary.  MyChart is used to connect with patients for Virtual Visits (Telemedicine).  Patients are able to view lab/test results, encounter notes, upcoming appointments, etc.  Non-urgent messages can be sent to your provider as well.   To learn more about what you can do with MyChart, go to [email or web address].    Your next appointment:   3 WEEKS   Provider:   Kristeen Miss, MD     Other Instructions

## 2023-07-19 NOTE — Assessment & Plan Note (Signed)
Repeat BMET today recheck sodium.

## 2023-07-19 NOTE — Assessment & Plan Note (Signed)
Blood pressure is well-controlled.  Increase diltiazem as noted to better control heart rate with atrial fibrillation.  She knows to contact us if she develops hypotension with this.

## 2023-07-19 NOTE — Assessment & Plan Note (Signed)
Moderate mitral gravitation echocardiogram in the hospital.  Again will need to repeat her echocardiogram at some point when she is back in sinus rhythm to reassess.

## 2023-07-19 NOTE — Assessment & Plan Note (Signed)
Moderate pulm hypertension echocardiogram the hospital.  This was likely related to volume overload.  We will need to repeat an echocardiogram at some point to reassess her pulmonary pressures.

## 2023-07-19 NOTE — Telephone Encounter (Signed)
Lpmtcb to schedule INR checks weekly up to visit with Scott Weaver/[facility].

## 2023-07-19 NOTE — Assessment & Plan Note (Signed)
She remains in atrial fibrillation with rapid ventricular rate.  She is quite symptomatic with this including chest pressure, shortness of breath and fatigue.  We had a long discussion regarding restoration of normal sinus rhythm.  She had a therapeutic INR on 10/14 and again on 10/28.  She is concerned about going through another procedure.  I tried to reassure her that a cardioversion is fairly safe.  She would like to continue to think about this with her husband.  If we were able to proceed with cardioversion in the next 1 to 2 weeks, she would need a transesophageal echocardiogram as she has not had a therapeutic INR for at least 3 weeks. -Arrange weekly INRs until follow-up -Increase diltiazem to 120 mg twice daily -Continue metoprolol tartrate 100 mg twice daily -Continue Coumadin as directed -Follow-up 3 weeks -She will contact me if she changes her mind to proceed with TEE cardioversion earlier -She will likely need follow-up with A-fib clinic after cardioversion in case she reverts back to A-fib

## 2023-07-19 NOTE — Progress Notes (Signed)
Cardiology Office Note:    Date:  07/19/2023  ID:  Hannah Downs, DOB 1946-01-16, MRN [id number] PCP: [name], MD  [facility] Providers Cardiologist:  Kristeen Miss, MD Cardiology APP:  Beatrice Lecher, PA-C       Patient Profile:      Paroxysmal atrial fibrillation Echo 7/21: EF 55-60, Gr 2 DD, RVSP 38.3, mild BAE Monitor 03/2020: <1% A-fib burden Echo 11/08/2021: EF 60-65, Gr 2 DD, normal RVSF, mild BAE, trivial MR TTE 06/23/2023: EF 60-65, no RWMA, normal RVSF, moderate pulmonary hypertension, RVSP 45, RAE, moderate MR, moderate to severe TR, RAP 15 (HFpEF) heart failure with preserved ejection fraction  Pt w vol overload during admit for gyn surgery in 06/2023  Mitral regurgitation Moderate on TTE in 06/2023 Hypertension Hyperlipidemia Aortic Atherosclerosis GERD Hx of 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  Hannah Downs is a 77 y.o. female who returns for post hospital follow up. She was last seen in clinic in April 2024 by Robin Searing, NP. She was admitted in 06/2023 for elective ureterovaginal prolapse surgery.  This was complicated by postoperative atrial fibrillation with rapid ventricular rate and volume overload due to fluid resuscitation complicated by acute on chronic hyponatremia.  She was evaluated by cardiology.  She was given IV Lasix x 1.  Rate controlling medications were adjusted. She was seen by nephrology for hyponatremia.  She was given tolvaptan 15 mg x 1 and sent home on salt tablets for 5 days with fluid restrictions.  HCTZ was discontinued.    She is here with her husband and her daughter. The patient reports feeling a light pressure on her chest with activity.  It is not severe but  noticeable. This pressure is alleviated when she sits down. She also reports episodes of shortness of breath. These symptoms began approximately two weeks prior to her recent gynecological procedure. The patient also reports feeling fatigued and lacking the energy to engage in activities she would like to do. She has not experienced any episodes of passing out since being discharged from the hospital. She reports no worsening of her symptoms, which have remained about the same. She denies any orthopnea or peripheral edema, except for a chronic mild swelling in her right leg.      Review of Systems  Gastrointestinal:  Negative for hematochezia and melena.  Genitourinary:  Negative for hematuria.  See HPI     Studies Reviewed:   EKG Interpretation Date/Time:  Tuesday July 19 2023 13:35:20 EST Ventricular Rate:  112 PR Interval:    QRS Duration:  74 QT Interval:  278 QTC Calculation: 379 R Axis:   91  Text Interpretation: Atrial fibrillation with rapid ventricular response Rightward axis Non-specific ST-t changes Confirmed by Tereso Newcomer [phone]) on 07/19/2023 1:48:27 PM    Results   LABS INR: 2.5 (07/11/2023) INR: 2.4 (06/27/2023) INR: 1.4 (06/24/2023)     Risk Assessment/Calculations:    CHA2DS2-VASc Score = 7   This indicates a 11.2% annual risk of stroke. The patient's score is based upon: CHF History: 0 HTN History: 1 Diabetes History: 0 Stroke History: 2 Vascular Disease History: 1 Age Score: 2 Gender Score: 1  Physical Exam:   VS:  BP 112/88   Pulse (!) 112   Ht 5' (1.524 m)   Wt 125 lb (56.7 kg)   SpO2 95%   BMI 24.41 kg/m    Wt Readings from Last 3 Encounters:  07/19/23 125 lb (56.7 kg)  06/20/23 125 lb 1.6 oz (56.7 kg)  01/05/23 122 lb 12.8 oz (55.7 kg)    Constitutional:      Appearance: Healthy appearance. Not in distress.  Neck:     Vascular: No JVR. JVD normal.  Pulmonary:     Breath sounds: Normal breath sounds. No wheezing. No  rales.  Cardiovascular:     Tachycardia present. Irregularly irregular rhythm.     Murmurs: There is no murmur.  Edema:    Peripheral edema present.    Ankle: bilateral trace edema of the ankle. Abdominal:     Palpations: Abdomen is soft.        Assessment and Plan:   Assessment & Plan Paroxysmal atrial fibrillation (HCC) She remains in atrial fibrillation with rapid ventricular rate.  She is quite symptomatic with this including chest pressure, shortness of breath and fatigue.  We had a long discussion regarding restoration of normal sinus rhythm.  She had a therapeutic INR on 10/14 and again on 10/28.  She is concerned about going through another procedure.  I tried to reassure her that a cardioversion is fairly safe.  She would like to continue to think about this with her husband.  If we were able to proceed with cardioversion in the next 1 to 2 weeks, she would need a transesophageal echocardiogram as she has not had a therapeutic INR for at least 3 weeks. -Arrange weekly INRs until follow-up -Increase diltiazem to 120 mg twice daily -Continue metoprolol tartrate 100 mg twice daily -Continue Coumadin as directed -Follow-up 3 weeks -She will contact me if she changes her mind to proceed with TEE cardioversion earlier -She will likely need follow-up with A-fib clinic after cardioversion in case she reverts back to A-fib Chronic heart failure with preserved ejection fraction (HCC) She had volume overload with fluid resuscitation after her surgery in the setting of atrial fibrillation with rapid ventricular rate.  Volume status is currently stable.  She has dyspnea exertion related to atrial fibrillation.  She is not currently on diuretic therapy. Other secondary pulmonary hypertension (HCC) Moderate pulm hypertension echocardiogram the hospital.  This was likely related to volume overload.  We will need to repeat an echocardiogram at some point to reassess her pulmonary  pressures. Nonrheumatic mitral valve regurgitation Moderate mitral gravitation echocardiogram in the hospital.  Again will need to repeat her echocardiogram at some point when she is back in sinus rhythm to reassess. Essential hypertension Blood pressure is well-controlled.  Increase diltiazem as noted to better control heart rate with atrial fibrillation.  She knows to contact us if she develops hypotension with this. Hyponatremia Repeat BMET today recheck sodium. Precordial chest pain She does note chest discomfort described as pressure.  I suspect that this is all related to atrial fibrillation.  Her EKG does not demonstrate any ischemic changes.  She had a normal troponin in the hospital.  Echocardiogram demonstrated normal LV function.  If she continues to have chest discomfort despite restoration of normal sinus rhythm, we will need to pursue ischemic workup.       Dispo:  Return in about 3 weeks (around 08/09/2023) for Close Follow Up, w/ Dr. Elease Hashimoto, or Tereso Newcomer, PA-C.  Signed, Lorin Picket  Alben Spittle, PA-C

## 2023-07-19 NOTE — Assessment & Plan Note (Signed)
She had volume overload with fluid resuscitation after her surgery in the setting of atrial fibrillation with rapid ventricular rate.  Volume status is currently stable.  She has dyspnea exertion related to atrial fibrillation.  She is not currently on diuretic therapy.

## 2023-07-20 LAB — BASIC METABOLIC PANEL
BUN/Creatinine Ratio: 20 (ref 12–28)
BUN: 12 mg/dL (ref 8–27)
CO2: 22 mmol/L (ref 20–29)
Calcium: 9.8 mg/dL (ref 8.7–10.3)
Chloride: 99 mmol/L (ref 96–106)
Creatinine, Ser: 0.6 mg/dL (ref 0.57–1.00)
Glucose: 97 mg/dL (ref 70–99)
Potassium: 5.1 mmol/L (ref 3.5–5.2)
Sodium: 138 mmol/L (ref 134–144)
eGFR: 92 mL/min/{1.73_m2} (ref >59)

## 2023-07-22 ENCOUNTER — Ambulatory Visit: Payer: Medicare Other | Attending: Cardiology

## 2023-07-22 DIAGNOSIS — Z5181 Encounter for therapeutic drug level monitoring: Secondary | ICD-10-CM | POA: Diagnosis not present

## 2023-07-22 DIAGNOSIS — I4891 Unspecified atrial fibrillation: Secondary | ICD-10-CM | POA: Insufficient documentation

## 2023-07-22 DIAGNOSIS — I480 Paroxysmal atrial fibrillation: Secondary | ICD-10-CM | POA: Diagnosis not present

## 2023-07-22 LAB — POCT INR: INR: 3.4 — AB (ref 2.0–3.0)

## 2023-07-22 NOTE — Patient Instructions (Signed)
Continue taking warfarin 1 tablet daily except for 1.5 tablets on Tuesdays. Recheck INR in 1 week.  [phone]   Eat greens tonight

## 2023-07-29 ENCOUNTER — Ambulatory Visit: Payer: Medicare Other | Attending: Internal Medicine

## 2023-07-29 DIAGNOSIS — Z5181 Encounter for therapeutic drug level monitoring: Secondary | ICD-10-CM | POA: Insufficient documentation

## 2023-07-29 DIAGNOSIS — I4891 Unspecified atrial fibrillation: Secondary | ICD-10-CM | POA: Diagnosis not present

## 2023-07-29 LAB — POCT INR: INR: 3.9 — AB (ref 2.0–3.0)

## 2023-07-29 NOTE — Patient Instructions (Signed)
Description   Eat a serving of greens and only take 1/2 tablet today and then START taking warfarin 1 tablet daily.  Recheck INR in 1 week.   [facility] [phone]

## 2023-08-01 ENCOUNTER — Other Ambulatory Visit: Payer: Self-pay | Admitting: Cardiovascular Disease

## 2023-08-01 DIAGNOSIS — I480 Paroxysmal atrial fibrillation: Secondary | ICD-10-CM

## 2023-08-02 ENCOUNTER — Encounter: Payer: Self-pay | Admitting: Obstetrics and Gynecology

## 2023-08-02 ENCOUNTER — Ambulatory Visit (INDEPENDENT_AMBULATORY_CARE_PROVIDER_SITE_OTHER): Payer: Medicare Other | Admitting: Obstetrics and Gynecology

## 2023-08-02 VITALS — BP 133/92 | HR 88

## 2023-08-02 DIAGNOSIS — Z9889 Other specified postprocedural states: Secondary | ICD-10-CM

## 2023-08-02 DIAGNOSIS — Z48816 Encounter for surgical aftercare following surgery on the genitourinary system: Secondary | ICD-10-CM

## 2023-08-02 NOTE — Progress Notes (Signed)
Hannah Downs  Date of Visit: 08/02/2023  History of Present Illness: Hannah Downs is a 77 y.o. female scheduled today for a post-operative visit.   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 She passed her postoperative void trial, had PVR of 0ml.   Postoperative course was complicated by prolonged stay due to rapid A. Fib.   Today she reports she is doing well. Stopped bleeding about 2 weeks ago. Has had persistent A. Fib and is considering cardioversion.  UTI in the last 6 weeks? No  Pain? No  She has not returned to her normal activity (except for postop restrictions) Vaginal bulge? No  Stress incontinence: Yes - not bothersome to her Urgency/frequency: No - on trospium 20mg  BID Urge incontinence: No  Voiding dysfunction: No - feels she empties better at night Bowel issues: No   Subjective Success: Do you usually have a bulge or something falling out that you can see or feel in the vaginal area? No  Retreatment Success: Any retreatment with surgery or pessary for any compartment? No    Medications: She has a current medication list which includes the following prescription(s): acetaminophen, atorvastatin, vitamin d3, diltiazem, estradiol, guaifenesin, metoprolol tartrate, omeprazole, potassium chloride, trospium, and warfarin.   Allergies: Patient is allergic to lisinopril and thiazide-type diuretics.   Physical Exam: BP (!) 133/92   Pulse 88    Pelvic Examination: Vagina: Incisions healing well. Sutures are not present at incision line and there is not granulation tissue. No tenderness.  POP-Q: POP-Q  -2                                            Aa   -2                                           Ba  -2                                              C   2.5                                            Gh  8                                             Pb  2                                            tvl   -2                                            Ap  -  2                                            Bp                                                 D     ---------------------------------------------------------  Assessment and Plan:  1. Post-operative state     - Well healed - Continue the trospium 20mg  twice a day.  - Can resume regular activity including exercise once cleared by cardiology.  - Does not want treatment for SUI  Return 6 months to follow up urge symptoms. May consider discontinuing medication at that time  Marguerita Beards, MD

## 2023-08-04 ENCOUNTER — Ambulatory Visit: Payer: Medicare Other | Attending: Cardiovascular Disease

## 2023-08-04 DIAGNOSIS — I4891 Unspecified atrial fibrillation: Secondary | ICD-10-CM

## 2023-08-04 DIAGNOSIS — Z5181 Encounter for therapeutic drug level monitoring: Secondary | ICD-10-CM

## 2023-08-04 LAB — POCT INR: INR: 3.1 — AB (ref 2.0–3.0)

## 2023-08-04 NOTE — Patient Instructions (Addendum)
Description   Eat a serving of greens and continue taking warfarin 1 tablet daily. Stay consistent with multi Vitamins and greens   Recheck INR in 1 week.   [facility] [phone]

## 2023-08-09 NOTE — Progress Notes (Unsigned)
Cardiology Office Note:    Date:  08/10/2023  ID:  Hannah Downs, DOB 1945/11/29, MRN [id number] PCP: [name], MD  [facility] Providers Cardiologist:  Christell Constant, MD Cardiology APP:  Beatrice Lecher, PA-C       Patient Profile:      Paroxysmal atrial fibrillation Echo 7/21: EF 55-60, Gr 2 DD, RVSP 38.3, mild BAE Monitor 03/2020: <1% A-fib burden Echo 11/08/2021: EF 60-65, Gr 2 DD, normal RVSF, mild BAE, trivial MR TTE 06/23/2023: EF 60-65, no RWMA, normal RVSF, moderate pulmonary hypertension, RVSP 45, RAE, moderate MR, moderate to severe TR, RAP 15 (HFpEF) heart failure with preserved ejection fraction  Pt w vol overload during admit for gyn surgery in 06/2023  Mitral regurgitation Moderate on TTE in 06/2023 Hypertension Hyperlipidemia Aortic Atherosclerosis GERD Hx of 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  Hannah Downs is a 77 y.o. female who returns for follow up of AFib. She was last seen 07/19/23. She had been admitted for Gyn surgery which was complicated by AF w RVR, volume overload, hyponatremia. When last seen, she noted some chest pain with exertion, dyspnea on exertion, fatigue. We discussed TEE-DCCV but she preferred to wait and proceed with DCCV after 3 weeks on therapeutic anticoagulation.   She is here with her daughter. The patient reports experiencing chest pressure, described as 'like someone's sitting on my chest,' which has worsened since the dose of Cardizem was increased. This chest pressure is associated with episodes of shortness of breath, particularly when walking, even short distances such as down the hallway. The patient denies experiencing shortness of breath when at rest. She  also denies orthopnea, paroxysmal nocturnal dyspnea, and syncope. The patient also reports significant lower extremity swelling and a weight gain of five pounds, despite a decreased appetite. She denies any abdominal swelling or tightness. The patient has not noticed any bleeding or changes in stool color. The patient's symptoms have significantly impacted her daily activities, as she reports a lack of energy and inability to perform tasks such as making her bed or taking her dog outside.     Review of Systems  Gastrointestinal:  Negative for hematochezia and melena.  Genitourinary:  Negative for hematuria.   See HPI    Studies Reviewed:   EKG Interpretation Date/Time:  Wednesday August 10 2023 08:43:38 EST Ventricular Rate:  121 PR Interval:    QRS Duration:  78 QT Interval:  308 QTC Calculation: 437 R Axis:   93  Text Interpretation: Atrial fibrillation with rapid ventricular response Rightward axis Nonspecific ST and T wave abnormality No significant change since last tracing Confirmed by Tereso Newcomer [phone]) on 08/10/2023 8:54:07 AM    Lab Results  Component Value Date   INR 4.7 (A) 08/10/2023   INR 3.1 (A) 08/04/2023   INR 3.9 (A) 07/29/2023   INR 3.4 (A) 07/22/2023            Risk Assessment/Calculations:    CHA2DS2-VASc Score = 7   This indicates a 11.2% annual risk of stroke. The patient's score is based upon: CHF History: 0 HTN History: 1 Diabetes History: 0 Stroke History: 2 Vascular Disease History: 1 Age Score: 2 Gender Score:  1           Physical Exam:   VS:  BP (!) 140/64   Pulse (!) 121   Ht 5' (1.524 m)   Wt 130 lb 12.8 oz (59.3 kg)   SpO2 97%   BMI 25.55 kg/m    Wt Readings from Last 3 Encounters:  08/10/23 130 lb 12.8 oz (59.3 kg)  07/19/23 125 lb (56.7 kg)  06/20/23 125 lb 1.6 oz (56.7 kg)    Constitutional:      Appearance: Healthy appearance. Not in distress.  Neck:     Vascular: JVR present. JVD elevated.  Pulmonary:     Breath  sounds: Normal breath sounds. No wheezing. No rales.  Cardiovascular:     Tachycardia present. Irregularly irregular rhythm.     Murmurs: There is no murmur.  Edema:    Peripheral edema present.    Pretibial: bilateral 1+ edema of the pretibial area.    Ankle: bilateral 2+ edema of the ankle. Abdominal:     Palpations: Abdomen is soft.  Skin:    General: Skin is warm and dry.      Assessment and Plan:   Assessment & Plan Persistent atrial fibrillation (HCC) She remains in atrial fibrillation with rapid ventricular rate.  She has not had diltiazem or metoprolol yet today.  She does note that her rates at home have been less than 100 typically.  She is quite symptomatic with atrial fibrillation now with symptoms of heart failure as well as chest pressure with exertion.  She does not have a history of coronary artery disease.  I suspect her chest pressure and shortness of breath are related to volume overload in the setting of rapid ventricular rate.  I reviewed this with Dr. Graciela Husbands (attending MD) who agreed.  I have recommended that she proceed with cardioversion.  We do have openings today but she is unable to have it done today.  Tomorrow is a holiday.  We will schedule her in 2 days.  She knows to go to the emergency room if she has worsening symptoms.  INR today is 4.7.  She has remained therapeutic for at least 3 weeks. -Continue diltiazem 120 mg twice daily, metoprolol tartrate 100 mg twice daily -Coumadin dosing managed by Coumadin clinic today -She will need a follow-up INR at the time of her cardioversion -Arrange cardioversion 08/12/2023 -BMET, CBC today -Follow-up with me in 1 week Chronic heart failure with preserved ejection fraction (HCC) Echo 06/23/2023 with EF 60-65, moderate pulmonary hypertension, moderate MR, moderate to severe TR.  Her echocardiogram was performed while she was in the hospital with volume overload in the setting of atrial fibrillation in the postoperative  period.  She is volume overloaded now.  She is gained 5 pounds since her last visit.  She is NYHA III.  Volume excess is driven by atrial fibrillation with RVR.  Chest discomfort seems to be related to volume excess and rapid ventricular rate.  She has no ischemic changes on her EKG. -Start Lasix 40 mg daily -Continue potassium 10 mEq daily -BMET today -Follow-up 1 week Essential hypertension Blood pressure somewhat above target today.  However, she has not taken diltiazem or metoprolol yet.  Blood pressures at home have been optimal.  Continue diltiazem 120 mg twice daily, Toprol tartrate 100 mg twice daily.  Continue to monitor. Nonrheumatic mitral valve regurgitation Moderate much regurgitation on echocardiogram 06/23/2023.  Continue surveillance with annual echocardiograms. Other secondary pulmonary hypertension (HCC) RVSP on echocardiogram in  October 2024 was 45.  This was in the setting of volume excess.  Consider repeat limited echo once she is back in sinus rhythm with euvolemic status to reassess pulmonary pressures. Precordial chest pain As noted, I suspect her chest discomfort is all related to rapid ventricular rate and volume overload.  If she continues to have chest discomfort in the setting of normal sinus rhythm and normal volume status, she will need an ischemic workup. Hyponatremia She had significant hyponatremia in the postoperative period in October after her GYN surgery.  This occurred in the context of taking hydrochlorothiazide.  She also received 1 dose of IV Lasix.  She was followed by nephrology.  Sodium 07/19/2023 was 138.  She is clearly volume overloaded today.  I suspect she will be able to tolerate taking Lasix.  She has a BMET pending today and we will get a repeat BMET when she returns next week for follow-up.  Hopefully her volume will improve by that point and we can change her Lasix to as needed dosing.     Informed Consent   Shared Decision Making/Informed  Consent The risks (stroke, cardiac arrhythmias rarely resulting in the need for a temporary or permanent pacemaker, skin irritation or burns and complications associated with conscious sedation including aspiration, arrhythmia, respiratory failure and death), benefits (restoration of normal sinus rhythm) and alternatives of a direct current cardioversion were explained in detail to Ms. Gelb and she agrees to proceed.       Dispo:  Return in about 1 week (around 08/17/2023) for Post Procedure Follow Up, w/ Tereso Newcomer, PA-C.  Signed, Tereso Newcomer, PA-C

## 2023-08-09 NOTE — H&P (View-Only) (Signed)
 Cardiology Office Note:    Date:  08/10/2023  ID:  [name], DOB 1945/11/29, MRN [id number] PCP: [name], MD  [facility] Providers Cardiologist:  Christell Constant, MD Cardiology APP:  Beatrice Lecher, PA-C       Patient Profile:      Paroxysmal atrial fibrillation Echo 7/21: EF 55-60, Gr 2 DD, RVSP 38.3, mild BAE Monitor 03/2020: <1% A-fib burden Echo 11/08/2021: EF 60-65, Gr 2 DD, normal RVSF, mild BAE, trivial MR TTE 06/23/2023: EF 60-65, no RWMA, normal RVSF, moderate pulmonary hypertension, RVSP 45, RAE, moderate MR, moderate to severe TR, RAP 15 (HFpEF) heart failure with preserved ejection fraction  Pt w vol overload during admit for gyn surgery in 06/2023  Mitral regurgitation Moderate on TTE in 06/2023 Hypertension Hyperlipidemia Aortic Atherosclerosis GERD Hx of 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  Hannah Downs is a 77 y.o. female who returns for follow up of AFib. She was last seen 07/19/23. She had been admitted for Gyn surgery which was complicated by AF w RVR, volume overload, hyponatremia. When last seen, she noted some chest pain with exertion, dyspnea on exertion, fatigue. We discussed TEE-DCCV but she preferred to wait and proceed with DCCV after 3 weeks on therapeutic anticoagulation.   She is here with her daughter. The patient reports experiencing chest pressure, described as 'like someone's sitting on my chest,' which has worsened since the dose of Cardizem was increased. This chest pressure is associated with episodes of shortness of breath, particularly when walking, even short distances such as down the hallway. The patient denies experiencing shortness of breath when at rest. She  also denies orthopnea, paroxysmal nocturnal dyspnea, and syncope. The patient also reports significant lower extremity swelling and a weight gain of five pounds, despite a decreased appetite. She denies any abdominal swelling or tightness. The patient has not noticed any bleeding or changes in stool color. The patient's symptoms have significantly impacted her daily activities, as she reports a lack of energy and inability to perform tasks such as making her bed or taking her dog outside.     Review of Systems  Gastrointestinal:  Negative for hematochezia and melena.  Genitourinary:  Negative for hematuria.   See HPI    Studies Reviewed:   EKG Interpretation Date/Time:  Wednesday August 10 2023 08:43:38 EST Ventricular Rate:  121 PR Interval:    QRS Duration:  78 QT Interval:  308 QTC Calculation: 437 R Axis:   93  Text Interpretation: Atrial fibrillation with rapid ventricular response Rightward axis Nonspecific ST and T wave abnormality No significant change since last tracing Confirmed by Tereso Newcomer [phone]) on 08/10/2023 8:54:07 AM    Lab Results  Component Value Date   INR 4.7 (A) 08/10/2023   INR 3.1 (A) 08/04/2023   INR 3.9 (A) 07/29/2023   INR 3.4 (A) 07/22/2023            Risk Assessment/Calculations:    CHA2DS2-VASc Score = 7   This indicates a 11.2% annual risk of stroke. The patient's score is based upon: CHF History: 0 HTN History: 1 Diabetes History: 0 Stroke History: 2 Vascular Disease History: 1 Age Score: 2 Gender Score:  1           Physical Exam:   VS:  BP (!) 140/64   Pulse (!) 121   Ht 5' (1.524 m)   Wt 130 lb 12.8 oz (59.3 kg)   SpO2 97%   BMI 25.55 kg/m    Wt Readings from Last 3 Encounters:  08/10/23 130 lb 12.8 oz (59.3 kg)  07/19/23 125 lb (56.7 kg)  06/20/23 125 lb 1.6 oz (56.7 kg)    Constitutional:      Appearance: Healthy appearance. Not in distress.  Neck:     Vascular: JVR present. JVD elevated.  Pulmonary:     Breath  sounds: Normal breath sounds. No wheezing. No rales.  Cardiovascular:     Tachycardia present. Irregularly irregular rhythm.     Murmurs: There is no murmur.  Edema:    Peripheral edema present.    Pretibial: bilateral 1+ edema of the pretibial area.    Ankle: bilateral 2+ edema of the ankle. Abdominal:     Palpations: Abdomen is soft.  Skin:    General: Skin is warm and dry.      Assessment and Plan:   Assessment & Plan Persistent atrial fibrillation (HCC) She remains in atrial fibrillation with rapid ventricular rate.  She has not had diltiazem or metoprolol yet today.  She does note that her rates at home have been less than 100 typically.  She is quite symptomatic with atrial fibrillation now with symptoms of heart failure as well as chest pressure with exertion.  She does not have a history of coronary artery disease.  I suspect her chest pressure and shortness of breath are related to volume overload in the setting of rapid ventricular rate.  I reviewed this with Dr. Graciela Husbands (attending MD) who agreed.  I have recommended that she proceed with cardioversion.  We do have openings today but she is unable to have it done today.  Tomorrow is a holiday.  We will schedule her in 2 days.  She knows to go to the emergency room if she has worsening symptoms.  INR today is 4.7.  She has remained therapeutic for at least 3 weeks. -Continue diltiazem 120 mg twice daily, metoprolol tartrate 100 mg twice daily -Coumadin dosing managed by Coumadin clinic today -She will need a follow-up INR at the time of her cardioversion -Arrange cardioversion 08/12/2023 -BMET, CBC today -Follow-up with me in 1 week Chronic heart failure with preserved ejection fraction (HCC) Echo 06/23/2023 with EF 60-65, moderate pulmonary hypertension, moderate MR, moderate to severe TR.  Her echocardiogram was performed while she was in the hospital with volume overload in the setting of atrial fibrillation in the postoperative  period.  She is volume overloaded now.  She is gained 5 pounds since her last visit.  She is NYHA III.  Volume excess is driven by atrial fibrillation with RVR.  Chest discomfort seems to be related to volume excess and rapid ventricular rate.  She has no ischemic changes on her EKG. -Start Lasix 40 mg daily -Continue potassium 10 mEq daily -BMET today -Follow-up 1 week Essential hypertension Blood pressure somewhat above target today.  However, she has not taken diltiazem or metoprolol yet.  Blood pressures at home have been optimal.  Continue diltiazem 120 mg twice daily, Toprol tartrate 100 mg twice daily.  Continue to monitor. Nonrheumatic mitral valve regurgitation Moderate much regurgitation on echocardiogram 06/23/2023.  Continue surveillance with annual echocardiograms. Other secondary pulmonary hypertension (HCC) RVSP on echocardiogram in  October 2024 was 45.  This was in the setting of volume excess.  Consider repeat limited echo once she is back in sinus rhythm with euvolemic status to reassess pulmonary pressures. Precordial chest pain As noted, I suspect her chest discomfort is all related to rapid ventricular rate and volume overload.  If she continues to have chest discomfort in the setting of normal sinus rhythm and normal volume status, she will need an ischemic workup. Hyponatremia She had significant hyponatremia in the postoperative period in October after her GYN surgery.  This occurred in the context of taking hydrochlorothiazide.  She also received 1 dose of IV Lasix.  She was followed by nephrology.  Sodium 07/19/2023 was 138.  She is clearly volume overloaded today.  I suspect she will be able to tolerate taking Lasix.  She has a BMET pending today and we will get a repeat BMET when she returns next week for follow-up.  Hopefully her volume will improve by that point and we can change her Lasix to as needed dosing.     Informed Consent   Shared Decision Making/Informed  Consent The risks (stroke, cardiac arrhythmias rarely resulting in the need for a temporary or permanent pacemaker, skin irritation or burns and complications associated with conscious sedation including aspiration, arrhythmia, respiratory failure and death), benefits (restoration of normal sinus rhythm) and alternatives of a direct current cardioversion were explained in detail to Ms. Gelb and she agrees to proceed.       Dispo:  Return in about 1 week (around 08/17/2023) for Post Procedure Follow Up, w/ Tereso Newcomer, PA-C.  Signed, Tereso Newcomer, PA-C

## 2023-08-10 ENCOUNTER — Encounter: Payer: Self-pay | Admitting: Physician Assistant

## 2023-08-10 ENCOUNTER — Ambulatory Visit: Payer: Medicare Other | Attending: Physician Assistant | Admitting: Physician Assistant

## 2023-08-10 ENCOUNTER — Ambulatory Visit (INDEPENDENT_AMBULATORY_CARE_PROVIDER_SITE_OTHER): Payer: Medicare Other | Admitting: Pharmacist

## 2023-08-10 VITALS — BP 140/64 | HR 121 | Ht 60.0 in | Wt 130.8 lb

## 2023-08-10 DIAGNOSIS — I340 Nonrheumatic mitral (valve) insufficiency: Secondary | ICD-10-CM | POA: Insufficient documentation

## 2023-08-10 DIAGNOSIS — I2729 Other secondary pulmonary hypertension: Secondary | ICD-10-CM | POA: Insufficient documentation

## 2023-08-10 DIAGNOSIS — I480 Paroxysmal atrial fibrillation: Secondary | ICD-10-CM | POA: Diagnosis not present

## 2023-08-10 DIAGNOSIS — I639 Cerebral infarction, unspecified: Secondary | ICD-10-CM

## 2023-08-10 DIAGNOSIS — R072 Precordial pain: Secondary | ICD-10-CM | POA: Insufficient documentation

## 2023-08-10 DIAGNOSIS — E871 Hypo-osmolality and hyponatremia: Secondary | ICD-10-CM | POA: Diagnosis not present

## 2023-08-10 DIAGNOSIS — I5032 Chronic diastolic (congestive) heart failure: Secondary | ICD-10-CM | POA: Insufficient documentation

## 2023-08-10 DIAGNOSIS — I4819 Other persistent atrial fibrillation: Secondary | ICD-10-CM | POA: Diagnosis not present

## 2023-08-10 DIAGNOSIS — I10 Essential (primary) hypertension: Secondary | ICD-10-CM | POA: Insufficient documentation

## 2023-08-10 LAB — POCT INR: INR: 4.7 — AB (ref 2.0–3.0)

## 2023-08-10 MED ORDER — FUROSEMIDE 40 MG PO TABS: 40.0000 mg | ORAL_TABLET | Freq: Every day | ORAL | 3 refills | Status: DC

## 2023-08-10 NOTE — Assessment & Plan Note (Signed)
Echo 06/23/2023 with EF 60-65, moderate pulmonary hypertension, moderate MR, moderate to severe TR.  Her echocardiogram was performed while she was in the hospital with volume overload in the setting of atrial fibrillation in the postoperative period.  She is volume overloaded now.  She is gained 5 pounds since her last visit.  She is NYHA III.  Volume excess is driven by atrial fibrillation with RVR.  Chest discomfort seems to be related to volume excess and rapid ventricular rate.  She has no ischemic changes on her EKG. -Start Lasix 40 mg daily -Continue potassium 10 mEq daily -BMET today -Follow-up 1 week

## 2023-08-10 NOTE — Assessment & Plan Note (Signed)
Blood pressure somewhat above target today.  However, she has not taken diltiazem or metoprolol yet.  Blood pressures at home have been optimal.  Continue diltiazem 120 mg twice daily, Toprol tartrate 100 mg twice daily.  Continue to monitor.

## 2023-08-10 NOTE — Assessment & Plan Note (Signed)
RVSP on echocardiogram in October 2024 was 45.  This was in the setting of volume excess.  Consider repeat limited echo once she is back in sinus rhythm with euvolemic status to reassess pulmonary pressures.

## 2023-08-10 NOTE — Assessment & Plan Note (Signed)
She had significant hyponatremia in the postoperative period in October after her GYN surgery.  This occurred in the context of taking hydrochlorothiazide.  She also received 1 dose of IV Lasix.  She was followed by nephrology.  Sodium 07/19/2023 was 138.  She is clearly volume overloaded today.  I suspect she will be able to tolerate taking Lasix.  She has a BMET pending today and we will get a repeat BMET when she returns next week for follow-up.  Hopefully her volume will improve by that point and we can change her Lasix to as needed dosing.

## 2023-08-10 NOTE — Patient Instructions (Signed)
Medication Instructions:  Your physician has recommended you make the following change in your medication:   START Lasix 40 mg taking 1 daily  *If you need a refill on your cardiac medications before your next appointment, please call your pharmacy*   Lab Work: TODAY:  BMET & CBC  If you have labs (blood work) drawn today and your tests are completely normal, you will receive your results only by: MyChart Message (if you have MyChart) OR A paper copy in the mail If you have any lab test that is abnormal or we need to change your treatment, we will call you to review the results.   Testing/Procedures: Your physician has recommended that you have a Cardioversion (DCCV). Electrical Cardioversion uses a jolt of electricity to your heart either through paddles or wired patches attached to your chest. This is a controlled, usually prescheduled, procedure. Defibrillation is done under light anesthesia in the hospital, and you usually go home the day of the procedure. This is done to get your heart back into a normal rhythm. You are not awake for the procedure. Please see the instruction sheet given BELOW:          Dear Hannah Downs  You are scheduled for a Cardioversion on Friday, November 29 with Dr. Flora Lipps.  Please arrive at the [facility] (Main Entrance A) at [facility]: [address] [place], [place] [address] at 10:00 AM (This time is 1 hour(s) before your procedure to ensure your preparation).   Free valet parking service is available. You will check in at ADMITTING.   *Please Note: You will receive a call the day before your procedure to confirm the appointment time. That time may have changed from the original time based on the schedule for that day.*    DIET:  Nothing to eat or drink after midnight except a sip of water with medications (see medication instructions below)  MEDICATION INSTRUCTIONS: !!IF ANY NEW MEDICATIONS ARE STARTED AFTER TODAY, PLEASE NOTIFY  YOUR PROVIDER AS SOON AS POSSIBLE!!  FYI: Medications such as Semaglutide (Ozempic, [place]), Tirzepatide (Mounjaro, Zepbound), Dulaglutide (Trulicity), etc ("GLP1 agonists") AND Canagliflozin (Invokana), Dapagliflozin (Farxiga), Empagliflozin (Jardiance), Ertugliflozin (Steglatro), Bexagliflozin [employer]) or any combination with one of these drugs such as Invokamet (Canagliflozin/Metformin), Synjardy (Empagliflozin/Metformin), etc ("SGLT2 inhibitors") must be held around the time of a procedure. This is not a comprehensive list of all of these drugs. Please review all of your medications and talk to your provider if you take any one of these. If you are not sure, ask your provider.  HOLD LASIX THE DAY OF THE PROCEDURE    Continue taking your anticoagulant (blood thinner): Warfarin (Coumadin).  You will need to continue this after your procedure until you are told by your provider that it is safe to stop.    LABS: WILL BE DONE TODAY  FYI:  For your safety, and to allow [place] to monitor your vital signs accurately during the surgery/procedure we request: If you have artificial nails, gel coating, SNS etc, please have those removed prior to your surgery/procedure. Not having the nail coverings /polish removed may result in cancellation or delay of your surgery/procedure.  Your support person will be asked to wait in the waiting room during your procedure.  It is OK to have someone drop you off and come back when you are ready to be discharged.  You cannot drive after the procedure and will need someone to drive you home.  Bring your insurance cards.  *  Special Note: Every effort is made to have your procedure done on time. Occasionally there are emergencies that occur at the hospital that may cause delays. Please be patient if a delay does occur.      Follow-Up: At [facility], you and your health needs are our priority.  As part of our continuing mission to provide you with exceptional  heart care, we have created designated Provider Care Teams.  These Care Teams include your primary Cardiologist (physician) and Advanced Practice Providers (APPs -  Physician Assistants and Nurse Practitioners) who all work together to provide you with the care you need, when you need it.  We recommend signing up for the patient portal called "MyChart".  Sign up information is provided on this After Visit Summary.  MyChart is used to connect with patients for Virtual Visits (Telemedicine).  Patients are able to view lab/test results, encounter notes, upcoming appointments, etc.  Non-urgent messages can be sent to your provider as well.   To learn more about what you can do with MyChart, go to [email or web address].    Your next appointment:   1 week(s)  Provider:   Tereso Newcomer, PA-C         Other Instructions

## 2023-08-10 NOTE — Assessment & Plan Note (Signed)
She remains in atrial fibrillation with rapid ventricular rate.  She has not had diltiazem or metoprolol yet today.  She does note that her rates at home have been less than 100 typically.  She is quite symptomatic with atrial fibrillation now with symptoms of heart failure as well as chest pressure with exertion.  She does not have a history of coronary artery disease.  I suspect her chest pressure and shortness of breath are related to volume overload in the setting of rapid ventricular rate.  I reviewed this with Dr. Graciela Husbands (attending MD) who agreed.  I have recommended that she proceed with cardioversion.  We do have openings today but she is unable to have it done today.  Tomorrow is a holiday.  We will schedule her in 2 days.  She knows to go to the emergency room if she has worsening symptoms.  INR today is 4.7.  She has remained therapeutic for at least 3 weeks. -Continue diltiazem 120 mg twice daily, metoprolol tartrate 100 mg twice daily -Coumadin dosing managed by Coumadin clinic today -She will need a follow-up INR at the time of her cardioversion -Arrange cardioversion 08/12/2023 -BMET, CBC today -Follow-up with me in 1 week

## 2023-08-10 NOTE — Assessment & Plan Note (Signed)
Moderate much regurgitation on echocardiogram 06/23/2023.  Continue surveillance with annual echocardiograms.

## 2023-08-10 NOTE — Progress Notes (Signed)
Unable to reach patient about procedure, but was able to leave a detailed message. Stated that the patient needed to arrive at the hospital at 1015 , remain NPO after 0000, needs to have a ride home and a responsible adult to stay with them for 24 hours after the procedure. Instructed the patient to call back if they had any questions.

## 2023-08-11 LAB — BASIC METABOLIC PANEL
BUN/Creatinine Ratio: 19 (ref 12–28)
BUN: 11 mg/dL (ref 8–27)
CO2: 25 mmol/L (ref 20–29)
Calcium: 9.7 mg/dL (ref 8.7–10.3)
Chloride: 101 mmol/L (ref 96–106)
Creatinine, Ser: 0.58 mg/dL (ref 0.57–1.00)
Glucose: 106 mg/dL — ABNORMAL HIGH (ref 70–99)
Potassium: 4.3 mmol/L (ref 3.5–5.2)
Sodium: 139 mmol/L (ref 134–144)
eGFR: 93 mL/min/{1.73_m2} (ref >59)

## 2023-08-11 LAB — CBC
Hematocrit: 41.6 % (ref 34.0–46.6)
Hemoglobin: 14 g/dL (ref 11.1–15.9)
MCH: 31.6 pg (ref 26.6–33.0)
MCHC: 33.7 g/dL (ref 31.5–35.7)
MCV: 94 fL (ref 79–97)
Platelets: 320 10*3/uL (ref 150–450)
RBC: 4.43 x10E6/uL (ref 3.77–5.28)
RDW: 12.3 % (ref 11.7–15.4)
WBC: 6.6 10*3/uL (ref 3.4–10.8)

## 2023-08-12 ENCOUNTER — Ambulatory Visit (HOSPITAL_COMMUNITY): Payer: Medicare Other | Admitting: Anesthesiology

## 2023-08-12 ENCOUNTER — Other Ambulatory Visit: Payer: Self-pay

## 2023-08-12 ENCOUNTER — Ambulatory Visit (HOSPITAL_BASED_OUTPATIENT_CLINIC_OR_DEPARTMENT_OTHER): Payer: Medicare Other | Admitting: Anesthesiology

## 2023-08-12 ENCOUNTER — Ambulatory Visit (HOSPITAL_COMMUNITY)
Admission: RE | Admit: 2023-08-12 | Discharge: 2023-08-12 | Disposition: A | Discharge status: Home / Self Care | Payer: Medicare Other | Attending: Cardiovascular Disease | Admitting: Cardiovascular Disease

## 2023-08-12 ENCOUNTER — Encounter (HOSPITAL_COMMUNITY)
Admission: RE | Disposition: A | Discharge status: Home / Self Care | Payer: Self-pay | Source: Home / Self Care | Attending: Cardiovascular Disease

## 2023-08-12 DIAGNOSIS — I4819 Other persistent atrial fibrillation: Secondary | ICD-10-CM | POA: Diagnosis not present

## 2023-08-12 DIAGNOSIS — I2729 Other secondary pulmonary hypertension: Secondary | ICD-10-CM | POA: Insufficient documentation

## 2023-08-12 DIAGNOSIS — Z79899 Other long term (current) drug therapy: Secondary | ICD-10-CM | POA: Diagnosis not present

## 2023-08-12 DIAGNOSIS — I5032 Chronic diastolic (congestive) heart failure: Secondary | ICD-10-CM | POA: Insufficient documentation

## 2023-08-12 DIAGNOSIS — I110 Hypertensive heart disease with heart failure: Secondary | ICD-10-CM | POA: Insufficient documentation

## 2023-08-12 DIAGNOSIS — E871 Hypo-osmolality and hyponatremia: Secondary | ICD-10-CM | POA: Diagnosis not present

## 2023-08-12 DIAGNOSIS — Z8673 Personal history of transient ischemic attack (TIA), and cerebral infarction without residual deficits: Secondary | ICD-10-CM | POA: Insufficient documentation

## 2023-08-12 DIAGNOSIS — I5030 Unspecified diastolic (congestive) heart failure: Secondary | ICD-10-CM | POA: Diagnosis not present

## 2023-08-12 DIAGNOSIS — I4891 Unspecified atrial fibrillation: Secondary | ICD-10-CM | POA: Diagnosis not present

## 2023-08-12 DIAGNOSIS — I081 Rheumatic disorders of both mitral and tricuspid valves: Secondary | ICD-10-CM | POA: Insufficient documentation

## 2023-08-12 DIAGNOSIS — I480 Paroxysmal atrial fibrillation: Secondary | ICD-10-CM | POA: Diagnosis not present

## 2023-08-12 DIAGNOSIS — R072 Precordial pain: Secondary | ICD-10-CM | POA: Insufficient documentation

## 2023-08-12 DIAGNOSIS — E785 Hyperlipidemia, unspecified: Secondary | ICD-10-CM | POA: Diagnosis not present

## 2023-08-12 HISTORY — PX: CARDIOVERSION: EP1203

## 2023-08-12 LAB — PROTIME-INR
INR: 2.4 — ABNORMAL HIGH (ref 0.8–1.2)
Prothrombin Time: 26.5 s — ABNORMAL HIGH (ref 11.4–15.2)

## 2023-08-12 SURGERY — CARDIOVERSION (CATH LAB)
Anesthesia: General

## 2023-08-12 MED ORDER — LIDOCAINE 2% (20 MG/ML) 5 ML SYRINGE
INTRAMUSCULAR | Status: DC | PRN
  Administered 2023-08-12: 50 mg via INTRAVENOUS

## 2023-08-12 MED ORDER — PROPOFOL 10 MG/ML IV BOLUS
INTRAVENOUS | Status: DC | PRN
Start: 2023-08-12 — End: 2023-08-12
  Administered 2023-08-12: 50 mg via INTRAVENOUS

## 2023-08-12 MED ORDER — SODIUM CHLORIDE 0.9% FLUSH
10.0000 mL | Freq: Two times a day (BID) | INTRAVENOUS | Status: DC
  Administered 2023-08-12: 5 mL via INTRAVENOUS

## 2023-08-12 SURGICAL SUPPLY — 1 items: PAD DEFIB RADIO PHYSIO CONN (PAD) ×1 IMPLANT

## 2023-08-12 NOTE — Transfer of Care (Signed)
Immediate Anesthesia Transfer of Care Note  Patient: Hannah Downs  Procedure(s) Performed: CARDIOVERSION  Patient Location: Cath Lab  Anesthesia Type:MAC  Level of Consciousness: drowsy  Airway & Oxygen Therapy: Patient Spontanous Breathing and Patient connected to nasal cannula oxygen  Post-op Assessment: Report given to RN and Post -op Vital signs reviewed and stable  Post vital signs: Reviewed and stable  Last Vitals:  Vitals Value Taken Time  BP 96/68  08/12/23 1201  Temp    Pulse 67 08/12/23 1201  Resp 20 08/12/23 1201  SpO2 94 % 08/12/23 1201  Vitals shown include unfiled device data.  Last Pain:  Vitals:   08/12/23 0959  TempSrc: Temporal         Complications: No notable events documented.

## 2023-08-12 NOTE — Anesthesia Procedure Notes (Signed)
Procedure Name: MAC Date/Time: 08/12/2023 10:56 AM  Performed by: Waynard Edwards, CRNAPre-anesthesia Checklist: Patient identified and Patient being monitored Patient Re-evaluated:Patient Re-evaluated prior to induction Oxygen Delivery Method: Nasal cannula Preoxygenation: Pre-oxygenation with 100% oxygen Induction Type: IV induction Placement Confirmation: positive ETCO2 Dental Injury: Teeth and Oropharynx as per pre-operative assessment

## 2023-08-12 NOTE — Anesthesia Postprocedure Evaluation (Signed)
Anesthesia Post Note  Patient: Hannah Downs  Procedure(s) Performed: CARDIOVERSION     Patient location during evaluation: PACU Anesthesia Type: General Level of consciousness: awake and alert and oriented Pain management: pain level controlled Vital Signs Assessment: post-procedure vital signs reviewed and stable Respiratory status: spontaneous breathing, nonlabored ventilation and respiratory function stable Cardiovascular status: blood pressure returned to baseline and stable Postop Assessment: no apparent nausea or vomiting Anesthetic complications: no   No notable events documented.  Last Vitals:  Vitals:   08/12/23 0959  BP: (!) 132/93  Pulse: (!) 113  Resp: (!) 32  Temp: 36.8 C  SpO2: 98%    Last Pain:  Vitals:   08/12/23 0959  TempSrc: Temporal                 Duwane Gewirtz A.

## 2023-08-12 NOTE — CV Procedure (Signed)
   DIRECT CURRENT CARDIOVERSION  NAME:  Hannah Downs    MRN: [id number] DOB:  Aug 04, 1946    ADMIT DATE: 08/12/2023  Indication:  Symptomatic atrial fibrillation   Procedure Note:  The patient signed informed consent.  They have had had therapeutic anticoagulation with warfarin greater than 3 weeks.  Anesthesia was administered by Dr. Malen Gauze.  Adequate airway was maintained throughout and vital followed per protocol.  They were cardioverted x 1 with 200J of biphasic synchronized energy.  They converted to NSR.  There were no apparent complications.  The patient had normal neuro status and respiratory status post procedure with vitals stable as recorded elsewhere.    Follow up: They will continue on current medical therapy and follow up with cardiology as scheduled.  Gerri Spore T. Flora Lipps, MD, [facility]  [facility]  [address], [address] [place], [place] [address] [phone]  12:00 PM

## 2023-08-12 NOTE — Anesthesia Preprocedure Evaluation (Addendum)
Anesthesia Evaluation  Patient identified by MRN, date of birth, ID band Patient awake    Reviewed: Allergy & Precautions, NPO status , Patient's Chart, lab work & pertinent test results, reviewed documented beta blocker date and time   Airway Mallampati: II  TM Distance: >3 FB     Dental no notable dental hx. (+) Dental Advisory Given   Pulmonary shortness of breath and with exertion   Pulmonary exam normal breath sounds clear to auscultation       Cardiovascular hypertension, Pt. on medications and Pt. on home beta blockers  Rhythm:Irregular Rate:Normal  EKG 08/10/23 Atrial fibrillation with RVR 121/min  Echo 06/23/23 1. Left ventricular ejection fraction, by estimation, is 60 to 65%. The  left ventricle has normal function. The left ventricle has no regional  wall motion abnormalities. Left ventricular diastolic parameters were  normal.   2. Right ventricular systolic function is normal. The right ventricular  size is normal. There is moderately elevated pulmonary artery systolic  pressure. The estimated right ventricular systolic pressure is 45.0 mmHg.   3. Left atrial size was upper limit of normal.   4. Right atrial size was dilated.   5. The mitral valve is normal in structure. Moderate mitral valve  regurgitation. No evidence of mitral stenosis.   6. Tricuspid valve regurgitation is moderate to severe.   7. The aortic valve is normal in structure. Aortic valve regurgitation is  not visualized. No aortic stenosis is present.   8. The inferior vena cava is dilated in size with <50% respiratory  variability, suggesting right atrial pressure of 15 mmHg.      Neuro/Psych  PSYCHIATRIC DISORDERS      CVA, Residual Symptoms    GI/Hepatic Neg liver ROS,GERD  Medicated,,  Endo/Other  Hyperlipidemia  Renal/GU negative Renal ROS  negative genitourinary   Musculoskeletal  (+) Arthritis , Osteoarthritis,  Osteopenia    Abdominal   Peds  Hematology  (+) Blood dyscrasia, anemia Coumadin therapy- last INR 4.7 on 11/27   Anesthesia Other Findings   Reproductive/Obstetrics                             Anesthesia Physical Anesthesia Plan  ASA: 3  Anesthesia Plan: General   Post-op Pain Management: Minimal or no pain anticipated   Induction: Intravenous  PONV Risk Score and Plan: 3 and Treatment may vary due to age or medical condition and Propofol infusion  Airway Management Planned: Natural Airway, Nasal Cannula and Simple Face Mask  Additional Equipment: None  Intra-op Plan:   Post-operative Plan:   Informed Consent: I have reviewed the patients History and Physical, chart, labs and discussed the procedure including the risks, benefits and alternatives for the proposed anesthesia with the patient or authorized representative who has indicated his/her understanding and acceptance.     Dental advisory given  Plan Discussed with: Anesthesiologist and CRNA  Anesthesia Plan Comments:         Anesthesia Quick Evaluation

## 2023-08-12 NOTE — Interval H&P Note (Signed)
History and Physical Interval Note:  08/12/2023 10:10 AM  Hannah Downs  has presented today for surgery, with the diagnosis of atrial fibrillation.  The various methods of treatment have been discussed with the patient and family. After consideration of risks, benefits and other options for treatment, the patient has consented to  Procedure(s): CARDIOVERSION (N/A) as a surgical intervention.  The patient's history has been reviewed, patient examined, no change in status, stable for surgery.  I have reviewed the patient's chart and labs.  Questions were answered to the patient's satisfaction.    NPO for DCCV. On warfarin. INR at goal.   Gerri Spore T. Flora Lipps, MD, [facility] : [facility]  [facility]  [address] [place], [place] [address] [phone]  10:10 AM

## 2023-08-15 ENCOUNTER — Encounter (HOSPITAL_COMMUNITY): Payer: Self-pay | Admitting: Cardiovascular Disease

## 2023-08-15 NOTE — Progress Notes (Unsigned)
Cardiology Office Note:    Date:  08/16/2023  ID:  Hannah Downs, DOB 1946/02/20, MRN [id number] PCP: Hannah Montana, MD  [facility] Providers Cardiologist:  Hannah Constant, MD Cardiology APP:  Hannah Lecher, PA-C       Patient Profile:      Paroxysmal atrial fibrillation Echo 7/21: EF 55-60, Gr 2 DD, RVSP 38.3, mild BAE Monitor 03/2020: <1% A-fib burden Echo 11/08/2021: EF 60-65, Gr 2 DD, normal RVSF, mild BAE, trivial MR TTE 06/23/2023: EF 60-65, no RWMA, normal RVSF, moderate pulmonary hypertension, RVSP 45, RAE, moderate MR, moderate to severe TR, RAP 15 (HFpEF) heart failure with preserved ejection fraction  Pt w vol overload during admit for gyn surgery in 06/2023  Mitral regurgitation Moderate on TTE in 06/2023 Hypertension Hyperlipidemia Aortic Atherosclerosis GERD Hx of CVA (Feb 2023, March 2023) S/p urgent hernia repair Feb 2023 (Incarcerated L Fem) C/b AF w RVR and periop stroke         History of Present Illness:   Hannah Downs is a 77 y.o. female who returns for follow-up of CHF, atrial fibrillation.  She was set up for cardioversion after last visit due to persistent atrial fibrillation with rapid ventricular rate.  She was also developing volume overload.  I increased her Lasix.  She underwent cardioversion 08/12/2023 with restoration of NSR. She is here with her daughter. She notes that her HR increased into the 90s about 24 hours after her DCCV. She is unable to tell that she is back in AFib. She feels much better. She has not had chest tightness like she did before. Her breathing is much better. She is able to do activities around the house. Leg edema is improved/stable.          ROS   See HPI    Studies Reviewed:   EKG Interpretation Date/Time:  Tuesday August 16 2023 11:04:50 EST Ventricular Rate:  98 PR Interval:    QRS Duration:  76 QT Interval:  330 QTC Calculation: 421 R Axis:   96  Text Interpretation: Atrial  fibrillation Rightward axis Nonspecific T wave abnormality Confirmed by Hannah Downs ([id number]) on 08/16/2023 11:14:17 AM             Risk Assessment/Calculations:    CHA2DS2-VASc Score = 7   This indicates a 11.2% annual risk of stroke. The patient's score is based upon: CHF History: 0 HTN History: 1 Diabetes History: 0 Stroke History: 2 Vascular Disease History: 1 Age Score: 2 Gender Score: 1            Physical Exam:   VS:  BP 110/87   Pulse 98   Ht 5' (1.524 m)   Wt 125 lb 9.6 oz (57 kg)   SpO2 99%   BMI 24.53 kg/m    Wt Readings from Last 3 Encounters:  08/16/23 125 lb 9.6 oz (57 kg)  08/10/23 130 lb 12.8 oz (59.3 kg)  07/19/23 125 lb (56.7 kg)    Constitutional:      Appearance: Healthy appearance. Not in distress.  Neck:     Vascular: No JVR. JVD normal.  Pulmonary:     Breath sounds: Normal breath sounds. No wheezing. No rales.  Cardiovascular:     Normal rate. Irregularly irregular rhythm.     Murmurs: There is no murmur.  Edema:    Peripheral edema present.    Pretibial: 1+ edema of the left pretibial area and 2+ edema of the right pretibial area.  Abdominal:     Palpations: Abdomen is soft.        Assessment and Plan:   Assessment & Plan Persistent atrial fibrillation [facility]) She is s/p DCCV. Unfortunately, she has converted back to atrial fibrillation. HR is better controlled. Overall, she feels much better. However, she has not tolerated atrial fibrillation in the past. I am concerned that she will eventually start to feel poorly again. We discussed AAD options - Amiodarone vs Dofetilide. She has a normal QTc and normal renal function. I favor Dofetilide. But she does not want to be admitted to the hospital. We discussed potential side effects with Amiodarone to include thyroid, liver, pulmonary toxicity. We also discussed the need for annual eye exams. Recent LFTs, TSH were normal.  -Start Amiodarone 200 mg twice daily -Check INR today and continue  weekly INR checks until follow up -Continue Cardizem CD 120 mg twice daily, Metoprolol tartrate 100 mg twice daily  -Follow up 1 month -Arrange DCCV at follow up after adequate Amio load as long as INR remains therapeutic  Chronic heart failure with preserved ejection fraction (HCC) Volume status improved. NYHA II.  -Continue Lasix 40 mg once daily, K+ 10 mEq once daily.  -BMET today  Essential hypertension BP controlled. Continue Cardizem CD 120 mg twice daily, Metoprolol tartrate 100 mg twice daily, Lasix 40 mg once daily.  On amiodarone therapy Arrange PFTs with DLCO. As noted, we discussed need for annual eye exams. She will need periodic LFTs, TSH.       Dispo:  Return in about 4 weeks (around 09/13/2023) for Routine Follow Up, w/ Hannah Newcomer, PA-C.  Signed, Hannah Newcomer, PA-C

## 2023-08-16 ENCOUNTER — Encounter: Payer: Self-pay | Admitting: Physician Assistant

## 2023-08-16 ENCOUNTER — Ambulatory Visit: Payer: Medicare Other | Attending: Physician Assistant | Admitting: Physician Assistant

## 2023-08-16 ENCOUNTER — Ambulatory Visit (INDEPENDENT_AMBULATORY_CARE_PROVIDER_SITE_OTHER): Payer: Medicare Other | Admitting: Pharmacist

## 2023-08-16 VITALS — BP 110/87 | HR 98 | Ht 60.0 in | Wt 125.6 lb

## 2023-08-16 DIAGNOSIS — I480 Paroxysmal atrial fibrillation: Secondary | ICD-10-CM | POA: Insufficient documentation

## 2023-08-16 DIAGNOSIS — I5032 Chronic diastolic (congestive) heart failure: Secondary | ICD-10-CM | POA: Diagnosis not present

## 2023-08-16 DIAGNOSIS — Z79899 Other long term (current) drug therapy: Secondary | ICD-10-CM | POA: Diagnosis not present

## 2023-08-16 DIAGNOSIS — I4819 Other persistent atrial fibrillation: Secondary | ICD-10-CM | POA: Diagnosis not present

## 2023-08-16 DIAGNOSIS — I10 Essential (primary) hypertension: Secondary | ICD-10-CM | POA: Insufficient documentation

## 2023-08-16 DIAGNOSIS — I639 Cerebral infarction, unspecified: Secondary | ICD-10-CM

## 2023-08-16 LAB — POCT INR: INR: 3.6 — AB (ref 2.0–3.0)

## 2023-08-16 MED ORDER — AMIODARONE HCL 200 MG PO TABS
200.0000 mg | ORAL_TABLET | Freq: Two times a day (BID) | ORAL | 1 refills | Status: DC
Start: 2023-08-16 — End: 2023-09-23

## 2023-08-16 MED ORDER — AMIODARONE HCL 200 MG PO TABS: 200.0000 mg | ORAL_TABLET | Freq: Two times a day (BID) | ORAL | 0 refills | Status: DC

## 2023-08-16 NOTE — Patient Instructions (Addendum)
Hold warfarin today then start taking warfarin 1 tablet daily except 1/2 tablet on Tuesdays and Thursday. Stay consistent with multi Vitamins and greens   Recheck INR in 1 week at coumadin clinic  Coumadin Clinic [phone]

## 2023-08-16 NOTE — Assessment & Plan Note (Signed)
BP controlled. Continue Cardizem CD 120 mg twice daily, Metoprolol tartrate 100 mg twice daily, Lasix 40 mg once daily.

## 2023-08-16 NOTE — Assessment & Plan Note (Signed)
Volume status improved. NYHA II.  -Continue Lasix 40 mg once daily, K+ 10 mEq once daily.  -BMET today

## 2023-08-16 NOTE — Assessment & Plan Note (Addendum)
She is s/p DCCV. Unfortunately, she has converted back to atrial fibrillation. HR is better controlled. Overall, she feels much better. However, she has not tolerated atrial fibrillation in the past. I am concerned that she will eventually start to feel poorly again. We discussed AAD options - Amiodarone vs Dofetilide. She has a normal QTc and normal renal function. I favor Dofetilide. But she does not want to be admitted to the hospital. We discussed potential side effects with Amiodarone to include thyroid, liver, pulmonary toxicity. We also discussed the need for annual eye exams. Recent LFTs, TSH were normal.  -Start Amiodarone 200 mg twice daily -Check INR today and continue weekly INR checks until follow up -Continue Cardizem CD 120 mg twice daily, Metoprolol tartrate 100 mg twice daily  -Follow up 1 month -Arrange DCCV at follow up after adequate Amio load as long as INR remains therapeutic

## 2023-08-16 NOTE — Patient Instructions (Signed)
Medication Instructions:  Your physician has recommended you make the following change in your medication:   START Amiodarone 200 mg taking 1 twice a day  *If you need a refill on your cardiac medications before your next appointment, please call your pharmacy*   Lab Work: TODAY:  BMET  If you have labs (blood work) drawn today and your tests are completely normal, you will receive your results only by: MyChart Message (if you have MyChart) OR A paper copy in the mail If you have any lab test that is abnormal or we need to change your treatment, we will call you to review the results.   Testing/Procedures: Your physician has recommended that you have a pulmonary function test. Pulmonary Function Tests are a group of tests that measure how well air moves in and out of your lungs.    Follow-Up: At [facility], you and your health needs are our priority.  As part of our continuing mission to provide you with exceptional heart care, we have created designated Provider Care Teams.  These Care Teams include your primary Cardiologist (physician) and Advanced Practice Providers (APPs -  Physician Assistants and Nurse Practitioners) who all work together to provide you with the care you need, when you need it.  We recommend signing up for the patient portal called "MyChart".  Sign up information is provided on this After Visit Summary.  MyChart is used to connect with patients for Virtual Visits (Telemedicine).  Patients are able to view lab/test results, encounter notes, upcoming appointments, etc.  Non-urgent messages can be sent to your provider as well.   To learn more about what you can do with MyChart, go to [email or web address].    Your next appointment:   1 month(s)  Provider:   Tereso Newcomer, PA-C         Other Instructions

## 2023-08-17 LAB — BASIC METABOLIC PANEL
BUN/Creatinine Ratio: 22 (ref 12–28)
BUN: 13 mg/dL (ref 8–27)
CO2: 26 mmol/L (ref 20–29)
Calcium: 9.9 mg/dL (ref 8.7–10.3)
Chloride: 97 mmol/L (ref 96–106)
Creatinine, Ser: 0.6 mg/dL (ref 0.57–1.00)
Glucose: 107 mg/dL — ABNORMAL HIGH (ref 70–99)
Potassium: 4.1 mmol/L (ref 3.5–5.2)
Sodium: 139 mmol/L (ref 134–144)
eGFR: 92 mL/min/{1.73_m2} (ref >59)

## 2023-08-19 ENCOUNTER — Ambulatory Visit: Payer: Medicare Other | Admitting: Physician Assistant

## 2023-08-22 ENCOUNTER — Ambulatory Visit: Payer: Medicare Other | Attending: Internal Medicine

## 2023-08-22 DIAGNOSIS — I480 Paroxysmal atrial fibrillation: Secondary | ICD-10-CM | POA: Diagnosis not present

## 2023-08-22 DIAGNOSIS — Z5181 Encounter for therapeutic drug level monitoring: Secondary | ICD-10-CM | POA: Insufficient documentation

## 2023-08-22 DIAGNOSIS — I4891 Unspecified atrial fibrillation: Secondary | ICD-10-CM | POA: Diagnosis not present

## 2023-08-22 LAB — POCT INR: INR: 4.2 — AB (ref 2.0–3.0)

## 2023-08-22 NOTE — Patient Instructions (Signed)
Hold warfarin today then start taking warfarin 0.5 tablet daily except 1 tablet on Mondays, Wednesdays and Fridays Stay consistent with multi Vitamins and greens   Recheck INR in 1 week at coumadin clinic  Coumadin Clinic [phone]

## 2023-08-25 ENCOUNTER — Encounter (HOSPITAL_BASED_OUTPATIENT_CLINIC_OR_DEPARTMENT_OTHER): Payer: Medicare Other

## 2023-08-29 ENCOUNTER — Ambulatory Visit: Payer: Medicare Other | Attending: Cardiovascular Disease

## 2023-08-29 DIAGNOSIS — Z5181 Encounter for therapeutic drug level monitoring: Secondary | ICD-10-CM | POA: Diagnosis not present

## 2023-08-29 DIAGNOSIS — I4891 Unspecified atrial fibrillation: Secondary | ICD-10-CM | POA: Insufficient documentation

## 2023-08-29 LAB — POCT INR: INR: 3.8 — AB (ref 2.0–3.0)

## 2023-08-29 NOTE — Patient Instructions (Signed)
Description   Hold warfarin today then start taking warfarin 0.5 tablet daily except 1 tablet on Wednesdays and Fridays Stay consistent with multi Vitamins (4x per week) and greens (2x per week)  Recheck INR in 1 week at coumadin clinic  Coumadin Clinic [phone]

## 2023-08-31 ENCOUNTER — Other Ambulatory Visit: Payer: Self-pay | Admitting: Cardiovascular Disease

## 2023-08-31 DIAGNOSIS — I480 Paroxysmal atrial fibrillation: Secondary | ICD-10-CM

## 2023-08-31 NOTE — Telephone Encounter (Signed)
Warfarin 5mg  refill Afib Last INR 08/29/23 Last OV 08/16/23

## 2023-09-12 ENCOUNTER — Encounter (HOSPITAL_BASED_OUTPATIENT_CLINIC_OR_DEPARTMENT_OTHER): Payer: Medicare Other

## 2023-09-12 ENCOUNTER — Ambulatory Visit: Payer: Medicare Other | Attending: Internal Medicine

## 2023-09-12 DIAGNOSIS — Z5181 Encounter for therapeutic drug level monitoring: Secondary | ICD-10-CM | POA: Diagnosis not present

## 2023-09-12 DIAGNOSIS — I480 Paroxysmal atrial fibrillation: Secondary | ICD-10-CM

## 2023-09-12 DIAGNOSIS — I4891 Unspecified atrial fibrillation: Secondary | ICD-10-CM | POA: Diagnosis not present

## 2023-09-12 LAB — POCT INR: INR: 3.8 — AB (ref 2.0–3.0)

## 2023-09-12 NOTE — Patient Instructions (Signed)
Hold warfarin today then start taking warfarin 0.5 tablet daily except 1 tablet on Wednesdays Stay consistent with multi Vitamins (4x per week) and greens (2x per week)  Recheck INR in 1 week at coumadin clinic  Coumadin Clinic [phone]

## 2023-09-15 ENCOUNTER — Encounter (HOSPITAL_BASED_OUTPATIENT_CLINIC_OR_DEPARTMENT_OTHER): Payer: Medicare Other

## 2023-09-20 ENCOUNTER — Ambulatory Visit: Payer: Medicare Other | Attending: Internal Medicine

## 2023-09-20 DIAGNOSIS — I4891 Unspecified atrial fibrillation: Secondary | ICD-10-CM | POA: Diagnosis not present

## 2023-09-20 DIAGNOSIS — I480 Paroxysmal atrial fibrillation: Secondary | ICD-10-CM | POA: Diagnosis not present

## 2023-09-20 DIAGNOSIS — Z5181 Encounter for therapeutic drug level monitoring: Secondary | ICD-10-CM | POA: Diagnosis not present

## 2023-09-20 LAB — POCT INR: INR: 5.3 — AB (ref 2.0–3.0)

## 2023-09-20 NOTE — H&P (View-Only) (Signed)
Cardiology Office Note:    Date:  09/23/2023  ID:  Hannah Downs, DOB [birth year], MRN [id number] PCP: [name], MD  [facility] Providers Cardiologist:  Christell Constant, MD Cardiology APP:  Beatrice Lecher, PA-C       Patient Profile:      Persistent atrial fibrillation Echo 7/21: EF 55-60, Gr 2 DD, RVSP 38.3, mild BAE Monitor 03/2020: <1% A-fib burden Echo 11/08/2021: EF 60-65, Gr 2 DD, normal RVSF, mild BAE, trivial MR TTE 06/23/2023: EF 60-65, no RWMA, normal RVSF, moderate pulmonary hypertension, RVSP 45, RAE, moderate MR, moderate to severe TR, RAP 15 (HFpEF) heart failure with preserved ejection fraction  Pt w vol overload during admit for gyn surgery in 06/2023  Mitral regurgitation Moderate on TTE in 06/2023 Hypertension Hyperlipidemia Aortic Atherosclerosis GERD Hx of 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  Hannah Downs is a 78 y.o. female who returns for follow up of AFib, CHF. She was last seen 08/16/23 after a recent DCCV. She had ERAF and I started her on Amiodarone for rhythm control. She returns with an eye towards DCCV if she remains in AFib and has had a therapeutic INR. She is here with her daughter. She reports experiencing side effects from the amiodarone, including loss of appetite, nausea, and balance issues. The patient also reports increased fatigue and shortness of breath, particularly when engaging in physical activities such as making the bed. She notes that these symptoms have been consistent for the past couple of months and have not worsened since the last visit. The patient also reports a persistent swelling in one leg, which she manages with Lasix and compression hose. The swelling is  not reported to have worsened since the last visit. She has not had chest pain, syncope, melena, hematochezia.      ROS-See HPI     Studies Reviewed:   EKG Interpretation Date/Time:  Friday September 23 2023 09:54:50 EST Ventricular Rate:  89 PR Interval:    QRS Duration:  86 QT Interval:  350 QTC Calculation: 425 R Axis:   101  Text Interpretation: Atrial fibrillation Rightward axis Nonspecific T wave abnormality Confirmed by Tereso Newcomer [phone]) on 09/23/2023 10:24:48 AM   Recent Labs    08/22/23 1336 08/29/23 1509 09/12/23 1352 09/20/23 1531  INR 4.2* 3.8* 3.8* 5.3*    Results   LABS - Chart Review  08/16/23: K 4.1, Creatinine 0.6   06/23/23: ALT 35, TSH 1.32      Risk Assessment/Calculations:    CHA2DS2-VASc Score = 7   This indicates a 11.2% annual risk of stroke. The patient's score is based upon: CHF History: 0 HTN History: 1 Diabetes History: 0 Stroke History: 2 Vascular Disease History: 1 Age Score: 2 Gender Score: 1            Physical Exam:   VS:  BP 124/80   Pulse 80   Ht 5' (1.524 m)   Wt 124 lb 6.4 oz (56.4 kg)   SpO2 96%   BMI 24.30 kg/m    Wt Readings from Last 3 Encounters:  09/23/23 124 lb 6.4 oz (56.4 kg)  08/16/23 125 lb 9.6 oz (57 kg)  08/10/23 130  lb 12.8 oz (59.3 kg)    Constitutional:      Appearance: Healthy appearance. Not in distress.  Neck:     Vascular: No JVR. JVD normal.  Pulmonary:     Breath sounds: Normal breath sounds. No wheezing. No rales.  Cardiovascular:     Normal rate. Irregularly irregular rhythm.     Murmurs: There is no murmur.  Edema:    Peripheral edema present.    Pretibial: trace edema of the left pretibial area and 1+ edema of the right pretibial area. Abdominal:     Palpations: Abdomen is soft.        Assessment and Plan:   Assessment & Plan Persistent atrial fibrillation (HCC) She remains in atrial fibrillation despite being on amiodarone.  She remains significantly symptomatic with  atrial fibrillation and has had significant issues with controlling volume in the setting of HFpEF and atrial fibrillation.  I have recommended proceeding with cardioversion to restore normal sinus rhythm.  She has had some side effects related to amiodarone.  She is adequately loaded with amiodarone. Therefore, I think we can reduce her dose now. -Decrease amiodarone to 200 mg daily -Continue Cardizem 120 mg twice daily, metoprolol tartrate 100 mg twice daily, Coumadin as directed -BMET, CBC, PT/INR today -Coumadin clinic INR check 09/28/2023 as planned -Arrange DCCV 09/29/2023 -Follow-up 3 weeks -Refer to EP if she has recurrent A-fib  Chronic heart failure with preserved ejection fraction (HCC) NYHA IIb-III.  Volume status stable.  Continue Lasix 40 mg daily, potassium 10 mEq daily. On amiodarone therapy TSH and ALT in October 2024 were normal.  If she maintains sinus rhythm on amiodarone and continues this medication, we will need to recheck TSH and LFTs in the next 3 months.  She has PFTs pending next week.    Informed Consent   Shared Decision Making/Informed Consent The risks (stroke, cardiac arrhythmias rarely resulting in the need for a temporary or permanent pacemaker, skin irritation or burns and complications associated with conscious sedation including aspiration, arrhythmia, respiratory failure and death), benefits (restoration of normal sinus rhythm) and alternatives of a direct current cardioversion were explained in detail to Ms. Hollenbaugh and she agrees to proceed.       Dispo:  Return in about 3 weeks (around 10/14/2023) for Post Procedure Follow Up, w/ Tereso Newcomer, PA-C.  Signed, Tereso Newcomer, PA-C

## 2023-09-20 NOTE — Progress Notes (Signed)
 Cardiology Office Note:    Date:  09/23/2023  ID:  MAYLA TEETS, DOB [birth year], MRN [id number] PCP: [name], MD  [facility] Providers Cardiologist:  Christell Constant, MD Cardiology APP:  Beatrice Lecher, PA-C       Patient Profile:      Persistent atrial fibrillation Echo 7/21: EF 55-60, Gr 2 DD, RVSP 38.3, mild BAE Monitor 03/2020: <1% A-fib burden Echo 11/08/2021: EF 60-65, Gr 2 DD, normal RVSF, mild BAE, trivial MR TTE 06/23/2023: EF 60-65, no RWMA, normal RVSF, moderate pulmonary hypertension, RVSP 45, RAE, moderate MR, moderate to severe TR, RAP 15 (HFpEF) heart failure with preserved ejection fraction  Pt w vol overload during admit for gyn surgery in 06/2023  Mitral regurgitation Moderate on TTE in 06/2023 Hypertension Hyperlipidemia Aortic Atherosclerosis GERD Hx of 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  Hannah Downs is a 78 y.o. female who returns for follow up of AFib, CHF. She was last seen 08/16/23 after a recent DCCV. She had ERAF and I started her on Amiodarone for rhythm control. She returns with an eye towards DCCV if she remains in AFib and has had a therapeutic INR. She is here with her daughter. She reports experiencing side effects from the amiodarone, including loss of appetite, nausea, and balance issues. The patient also reports increased fatigue and shortness of breath, particularly when engaging in physical activities such as making the bed. She notes that these symptoms have been consistent for the past couple of months and have not worsened since the last visit. The patient also reports a persistent swelling in one leg, which she manages with Lasix and compression hose. The swelling is  not reported to have worsened since the last visit. She has not had chest pain, syncope, melena, hematochezia.      ROS-See HPI     Studies Reviewed:   EKG Interpretation Date/Time:  Friday September 23 2023 09:54:50 EST Ventricular Rate:  89 PR Interval:    QRS Duration:  86 QT Interval:  350 QTC Calculation: 425 R Axis:   101  Text Interpretation: Atrial fibrillation Rightward axis Nonspecific T wave abnormality Confirmed by Tereso Newcomer [phone]) on 09/23/2023 10:24:48 AM   Recent Labs    08/22/23 1336 08/29/23 1509 09/12/23 1352 09/20/23 1531  INR 4.2* 3.8* 3.8* 5.3*    Results   LABS - Chart Review  08/16/23: K 4.1, Creatinine 0.6   06/23/23: ALT 35, TSH 1.32      Risk Assessment/Calculations:    CHA2DS2-VASc Score = 7   This indicates a 11.2% annual risk of stroke. The patient's score is based upon: CHF History: 0 HTN History: 1 Diabetes History: 0 Stroke History: 2 Vascular Disease History: 1 Age Score: 2 Gender Score: 1            Physical Exam:   VS:  BP 124/80   Pulse 80   Ht 5' (1.524 m)   Wt 124 lb 6.4 oz (56.4 kg)   SpO2 96%   BMI 24.30 kg/m    Wt Readings from Last 3 Encounters:  09/23/23 124 lb 6.4 oz (56.4 kg)  08/16/23 125 lb 9.6 oz (57 kg)  08/10/23 130  lb 12.8 oz (59.3 kg)    Constitutional:      Appearance: Healthy appearance. Not in distress.  Neck:     Vascular: No JVR. JVD normal.  Pulmonary:     Breath sounds: Normal breath sounds. No wheezing. No rales.  Cardiovascular:     Normal rate. Irregularly irregular rhythm.     Murmurs: There is no murmur.  Edema:    Peripheral edema present.    Pretibial: trace edema of the left pretibial area and 1+ edema of the right pretibial area. Abdominal:     Palpations: Abdomen is soft.        Assessment and Plan:   Assessment & Plan Persistent atrial fibrillation (HCC) She remains in atrial fibrillation despite being on amiodarone.  She remains significantly symptomatic with  atrial fibrillation and has had significant issues with controlling volume in the setting of HFpEF and atrial fibrillation.  I have recommended proceeding with cardioversion to restore normal sinus rhythm.  She has had some side effects related to amiodarone.  She is adequately loaded with amiodarone. Therefore, I think we can reduce her dose now. -Decrease amiodarone to 200 mg daily -Continue Cardizem 120 mg twice daily, metoprolol tartrate 100 mg twice daily, Coumadin as directed -BMET, CBC, PT/INR today -Coumadin clinic INR check 09/28/2023 as planned -Arrange DCCV 09/29/2023 -Follow-up 3 weeks -Refer to EP if she has recurrent A-fib  Chronic heart failure with preserved ejection fraction (HCC) NYHA IIb-III.  Volume status stable.  Continue Lasix 40 mg daily, potassium 10 mEq daily. On amiodarone therapy TSH and ALT in October 2024 were normal.  If she maintains sinus rhythm on amiodarone and continues this medication, we will need to recheck TSH and LFTs in the next 3 months.  She has PFTs pending next week.    Informed Consent   Shared Decision Making/Informed Consent The risks (stroke, cardiac arrhythmias rarely resulting in the need for a temporary or permanent pacemaker, skin irritation or burns and complications associated with conscious sedation including aspiration, arrhythmia, respiratory failure and death), benefits (restoration of normal sinus rhythm) and alternatives of a direct current cardioversion were explained in detail to Ms. Hollenbaugh and she agrees to proceed.       Dispo:  Return in about 3 weeks (around 10/14/2023) for Post Procedure Follow Up, w/ Tereso Newcomer, PA-C.  Signed, Tereso Newcomer, PA-C

## 2023-09-20 NOTE — Patient Instructions (Signed)
 Hold warfarin today and Wednesday then start taking warfarin 0.5 tablet daily. Stay consistent with multi Vitamins (4x per week) and greens (2x per week)  Recheck INR in 1 week at coumadin clinic  [facility] [phone]

## 2023-09-23 ENCOUNTER — Ambulatory Visit: Payer: Medicare Other | Attending: Physician Assistant | Admitting: Physician Assistant

## 2023-09-23 ENCOUNTER — Encounter: Payer: Self-pay | Admitting: Physician Assistant

## 2023-09-23 VITALS — BP 124/80 | HR 80 | Ht 60.0 in | Wt 124.4 lb

## 2023-09-23 DIAGNOSIS — I5032 Chronic diastolic (congestive) heart failure: Secondary | ICD-10-CM | POA: Insufficient documentation

## 2023-09-23 DIAGNOSIS — Z79899 Other long term (current) drug therapy: Secondary | ICD-10-CM | POA: Insufficient documentation

## 2023-09-23 DIAGNOSIS — I4819 Other persistent atrial fibrillation: Secondary | ICD-10-CM | POA: Insufficient documentation

## 2023-09-23 NOTE — Assessment & Plan Note (Signed)
 She remains in atrial fibrillation despite being on amiodarone .  She remains significantly symptomatic with atrial fibrillation and has had significant issues with controlling volume in the setting of HFpEF and atrial fibrillation.  I have recommended proceeding with cardioversion to restore normal sinus rhythm.  She has had some side effects related to amiodarone .  She is adequately loaded with amiodarone . Therefore, I think we can reduce her dose now. -Decrease amiodarone  to 200 mg daily -Continue Cardizem  120 mg twice daily, metoprolol  tartrate 100 mg twice daily, Coumadin  as directed -BMET, CBC, PT/INR today -Coumadin  clinic INR check 09/28/2023 as planned -Arrange DCCV 09/29/2023 -Follow-up 3 weeks -Refer to EP if she has recurrent A-fib

## 2023-09-23 NOTE — Assessment & Plan Note (Signed)
 NYHA IIb-III.  Volume status stable.  Continue Lasix 40 mg daily, potassium 10 mEq daily.

## 2023-09-23 NOTE — Patient Instructions (Addendum)
 Medication Instructions:  Decrease Amiodarone  to 200 mg daily   *If you need a refill on your cardiac medications before your next appointment, please call your pharmacy*   Lab Work: BMET, CBC, PT-INR - Today   If you have labs (blood work) drawn today and your tests are completely normal, you will receive your results only by: MyChart Message (if you have MyChart) OR A paper copy in the mail If you have any lab test that is abnormal or we need to change your treatment, we will call you to review the results.   Testing/Procedures: Your physician has recommended that you have a Cardioversion (DCCV). Electrical Cardioversion uses a jolt of electricity to your heart either through paddles or wired patches attached to your chest. This is a controlled, usually prescheduled, procedure. Defibrillation is done under light anesthesia in the hospital, and you usually go home the day of the procedure. This is done to get your heart back into a normal rhythm. You are not awake for the procedure. Please see the instruction sheet given to you today.    Follow-Up: Follow up as scheduled   Other Instructions     Dear Hannah Downs  You are scheduled for a Cardioversion on Thursday, January 16 with Dr. Dub Huntsman.  Please arrive at the [facility] (Main Entrance A) at [facility]: [address] [place], [place] [address] at 9:00 AM (This time is 1 hour(s) before your procedure to ensure your preparation).   Free valet parking service is available. You will check in at ADMITTING.   *Please Note: You will receive a call the day before your procedure to confirm the appointment time. That time may have changed from the original time based on the schedule for that day.*    DIET:  Nothing to eat or drink after midnight except a sip of water with medications (see medication instructions below)  MEDICATION INSTRUCTIONS: !!IF ANY NEW MEDICATIONS ARE STARTED AFTER TODAY, PLEASE NOTIFY YOUR  PROVIDER AS SOON AS POSSIBLE!!  FYI: Medications such as Semaglutide (Ozempic, Wegovy), Tirzepatide (Mounjaro, Zepbound), Dulaglutide (Trulicity), etc (GLP1 agonists) AND Canagliflozin (Invokana), Dapagliflozin (Farxiga), Empagliflozin (Jardiance), Ertugliflozin (Steglatro), Bexagliflozin [employer]) or any combination with one of these drugs such as Invokamet (Canagliflozin/Metformin), Synjardy (Empagliflozin/Metformin), etc (SGLT2 inhibitors) must be held around the time of a procedure. This is not a comprehensive list of all of these drugs. Please review all of your medications and talk to your provider if you take any one of these. If you are not sure, ask your provider.    Continue taking your anticoagulant (blood thinner): Warfarin (Coumadin ).  You will need to continue this after your procedure until you are told by your provider that it is safe to stop.    Hold Lasix  the morning of your procedure   FYI:  For your safety, and to allow us  to monitor your vital signs accurately during the surgery/procedure we request: If you have artificial nails, gel coating, SNS etc, please have those removed prior to your surgery/procedure. Not having the nail coverings /polish removed may result in cancellation or delay of your surgery/procedure.  Your support person will be asked to wait in the waiting room during your procedure.  It is OK to have someone drop you off and come back when you are ready to be discharged.  You cannot drive after the procedure and will need someone to drive you home.  Bring your insurance cards.  *Special Note: Every effort is made to  have your procedure done on time. Occasionally there are emergencies that occur at the hospital that may cause delays. Please be patient if a delay does occur.

## 2023-09-24 LAB — PROTIME-INR
INR: 2.6 — ABNORMAL HIGH (ref 0.9–1.2)
Prothrombin Time: 27.2 s — ABNORMAL HIGH (ref 9.1–12.0)

## 2023-09-24 LAB — CBC
Hematocrit: 43.1 % (ref 34.0–46.6)
Hemoglobin: 14.2 g/dL (ref 11.1–15.9)
MCH: 30.1 pg (ref 26.6–33.0)
MCHC: 32.9 g/dL (ref 31.5–35.7)
MCV: 91 fL (ref 79–97)
Platelets: 277 10*3/uL (ref 150–450)
RBC: 4.72 x10E6/uL (ref 3.77–5.28)
RDW: 11.9 % (ref 11.7–15.4)
WBC: 7.1 10*3/uL (ref 3.4–10.8)

## 2023-09-24 LAB — BASIC METABOLIC PANEL
BUN/Creatinine Ratio: 22 (ref 12–28)
BUN: 21 mg/dL (ref 8–27)
CO2: 27 mmol/L (ref 20–29)
Calcium: 10.4 mg/dL — ABNORMAL HIGH (ref 8.7–10.3)
Chloride: 100 mmol/L (ref 96–106)
Creatinine, Ser: 0.96 mg/dL (ref 0.57–1.00)
Glucose: 121 mg/dL — ABNORMAL HIGH (ref 70–99)
Potassium: 4.7 mmol/L (ref 3.5–5.2)
Sodium: 142 mmol/L (ref 134–144)
eGFR: 61 mL/min/{1.73_m2} (ref >59)

## 2023-09-27 ENCOUNTER — Ambulatory Visit (HOSPITAL_BASED_OUTPATIENT_CLINIC_OR_DEPARTMENT_OTHER): Payer: Medicare Other | Admitting: Internal Medicine

## 2023-09-27 DIAGNOSIS — I5032 Chronic diastolic (congestive) heart failure: Secondary | ICD-10-CM

## 2023-09-27 DIAGNOSIS — I4819 Other persistent atrial fibrillation: Secondary | ICD-10-CM

## 2023-09-27 DIAGNOSIS — Z79899 Other long term (current) drug therapy: Secondary | ICD-10-CM

## 2023-09-27 LAB — PULMONARY FUNCTION TEST
FEF 25-75 Post: 0.48 L/s
FEF 25-75 Pre: 0.55 L/s
FEF2575-%Change-Post: -12 %
FEF2575-%Pred-Post: 37 %
FEF2575-%Pred-Pre: 42 %
FEV1-%Change-Post: -2 %
FEV1-%Pred-Post: 63 %
FEV1-%Pred-Pre: 65 %
FEV1-Post: 1.01 L
FEV1-Pre: 1.04 L
FEV1FVC-%Change-Post: 4 %
FEV1FVC-%Pred-Pre: 87 %
FEV6-%Change-Post: -6 %
FEV6-%Pred-Post: 73 %
FEV6-%Pred-Pre: 78 %
FEV6-Post: 1.49 L
FEV6-Pre: 1.59 L
FEV6FVC-%Change-Post: 0 %
FEV6FVC-%Pred-Post: 106 %
FEV6FVC-%Pred-Pre: 105 %
FVC-%Change-Post: -7 %
FVC-%Pred-Post: 69 %
FVC-%Pred-Pre: 74 %
FVC-Post: 1.49 L
FVC-Pre: 1.6 L
Post FEV1/FVC ratio: 68 %
Post FEV6/FVC ratio: 100 %
Pre FEV1/FVC ratio: 65 %
Pre FEV6/FVC Ratio: 99 %
RV % pred: 135 %
RV: 2.82 L
TLC % pred: 98 %
TLC: 4.23 L

## 2023-09-27 NOTE — Patient Instructions (Signed)
 Full PFT Performed Today

## 2023-09-27 NOTE — Progress Notes (Signed)
 Full PFT Performed Today

## 2023-09-28 ENCOUNTER — Ambulatory Visit: Payer: Medicare Other

## 2023-09-28 DIAGNOSIS — R0602 Shortness of breath: Secondary | ICD-10-CM | POA: Diagnosis not present

## 2023-09-28 DIAGNOSIS — E871 Hypo-osmolality and hyponatremia: Secondary | ICD-10-CM | POA: Diagnosis not present

## 2023-09-28 DIAGNOSIS — M8580 Other specified disorders of bone density and structure, unspecified site: Secondary | ICD-10-CM | POA: Diagnosis present

## 2023-09-28 DIAGNOSIS — I5030 Unspecified diastolic (congestive) heart failure: Secondary | ICD-10-CM | POA: Diagnosis not present

## 2023-09-28 DIAGNOSIS — I110 Hypertensive heart disease with heart failure: Secondary | ICD-10-CM | POA: Diagnosis not present

## 2023-09-28 DIAGNOSIS — K219 Gastro-esophageal reflux disease without esophagitis: Secondary | ICD-10-CM | POA: Diagnosis present

## 2023-09-28 DIAGNOSIS — Z8673 Personal history of transient ischemic attack (TIA), and cerebral infarction without residual deficits: Secondary | ICD-10-CM | POA: Diagnosis not present

## 2023-09-28 DIAGNOSIS — R001 Bradycardia, unspecified: Secondary | ICD-10-CM | POA: Diagnosis not present

## 2023-09-28 DIAGNOSIS — Z96641 Presence of right artificial hip joint: Secondary | ICD-10-CM | POA: Diagnosis present

## 2023-09-28 DIAGNOSIS — I081 Rheumatic disorders of both mitral and tricuspid valves: Secondary | ICD-10-CM | POA: Diagnosis not present

## 2023-09-28 DIAGNOSIS — I4891 Unspecified atrial fibrillation: Secondary | ICD-10-CM | POA: Insufficient documentation

## 2023-09-28 DIAGNOSIS — Z7901 Long term (current) use of anticoagulants: Secondary | ICD-10-CM | POA: Diagnosis not present

## 2023-09-28 DIAGNOSIS — M1990 Unspecified osteoarthritis, unspecified site: Secondary | ICD-10-CM | POA: Diagnosis present

## 2023-09-28 DIAGNOSIS — N179 Acute kidney failure, unspecified: Secondary | ICD-10-CM | POA: Diagnosis not present

## 2023-09-28 DIAGNOSIS — Z79899 Other long term (current) drug therapy: Secondary | ICD-10-CM | POA: Diagnosis not present

## 2023-09-28 DIAGNOSIS — I5032 Chronic diastolic (congestive) heart failure: Secondary | ICD-10-CM | POA: Diagnosis not present

## 2023-09-28 DIAGNOSIS — E785 Hyperlipidemia, unspecified: Secondary | ICD-10-CM | POA: Diagnosis not present

## 2023-09-28 DIAGNOSIS — I700 Atherosclerosis of aorta: Secondary | ICD-10-CM | POA: Diagnosis not present

## 2023-09-28 DIAGNOSIS — R0789 Other chest pain: Secondary | ICD-10-CM | POA: Diagnosis not present

## 2023-09-28 DIAGNOSIS — E876 Hypokalemia: Secondary | ICD-10-CM | POA: Diagnosis not present

## 2023-09-28 DIAGNOSIS — Z823 Family history of stroke: Secondary | ICD-10-CM | POA: Diagnosis not present

## 2023-09-28 DIAGNOSIS — Z888 Allergy status to other drugs, medicaments and biological substances status: Secondary | ICD-10-CM | POA: Diagnosis not present

## 2023-09-28 DIAGNOSIS — I10 Essential (primary) hypertension: Secondary | ICD-10-CM | POA: Diagnosis not present

## 2023-09-28 DIAGNOSIS — I480 Paroxysmal atrial fibrillation: Secondary | ICD-10-CM | POA: Diagnosis not present

## 2023-09-28 DIAGNOSIS — Z5181 Encounter for therapeutic drug level monitoring: Secondary | ICD-10-CM | POA: Insufficient documentation

## 2023-09-28 DIAGNOSIS — I272 Pulmonary hypertension, unspecified: Secondary | ICD-10-CM | POA: Diagnosis not present

## 2023-09-28 DIAGNOSIS — D6869 Other thrombophilia: Secondary | ICD-10-CM | POA: Diagnosis not present

## 2023-09-28 DIAGNOSIS — I495 Sick sinus syndrome: Secondary | ICD-10-CM | POA: Diagnosis not present

## 2023-09-28 DIAGNOSIS — I4819 Other persistent atrial fibrillation: Secondary | ICD-10-CM | POA: Diagnosis not present

## 2023-09-28 DIAGNOSIS — R918 Other nonspecific abnormal finding of lung field: Secondary | ICD-10-CM | POA: Diagnosis not present

## 2023-09-28 DIAGNOSIS — I999 Unspecified disorder of circulatory system: Secondary | ICD-10-CM | POA: Diagnosis present

## 2023-09-28 LAB — POCT INR: INR: 2.6 (ref 2.0–3.0)

## 2023-09-28 NOTE — Patient Instructions (Addendum)
 Description   Do NOT eat greens or take multivitamin today. Continue taking warfarin 0.5 tablet daily. Stay consistent with multi Vitamins (3x per week) and greens (2x per week)  Recheck INR in 1 week at coumadin  clinic  [facility]oumadin  Clinic [phone]

## 2023-09-28 NOTE — Progress Notes (Signed)
 Spoke to pt and instructed them to come at 0830 and to be NPO after 0000.  Confirmed no missed doses of AC and instructed to take in AM with a small sip of water.  Confirmed that pt will have a ride home and someone to stay with them for 24 hours after the procedure. Instructed patient to not wear any jewelry or lotion.

## 2023-09-29 ENCOUNTER — Encounter (HOSPITAL_COMMUNITY): Payer: Self-pay | Admitting: Cardiology

## 2023-09-29 ENCOUNTER — Ambulatory Visit (HOSPITAL_COMMUNITY): Payer: Medicare Other | Admitting: Anesthesiology

## 2023-09-29 ENCOUNTER — Encounter (HOSPITAL_COMMUNITY)
Admission: RE | Disposition: A | Discharge status: Home / Self Care | Payer: Self-pay | Source: Home / Self Care | Attending: Cardiology

## 2023-09-29 ENCOUNTER — Ambulatory Visit (HOSPITAL_BASED_OUTPATIENT_CLINIC_OR_DEPARTMENT_OTHER)
Admission: RE | Admit: 2023-09-29 | Discharge: 2023-09-29 | Disposition: A | Discharge status: Home / Self Care | Payer: Medicare Other | Source: Home / Self Care | Attending: Cardiology | Admitting: Cardiology

## 2023-09-29 ENCOUNTER — Telehealth: Payer: Self-pay | Admitting: Internal Medicine

## 2023-09-29 ENCOUNTER — Other Ambulatory Visit: Payer: Self-pay

## 2023-09-29 DIAGNOSIS — I4819 Other persistent atrial fibrillation: Secondary | ICD-10-CM | POA: Insufficient documentation

## 2023-09-29 DIAGNOSIS — I110 Hypertensive heart disease with heart failure: Secondary | ICD-10-CM

## 2023-09-29 DIAGNOSIS — E785 Hyperlipidemia, unspecified: Secondary | ICD-10-CM

## 2023-09-29 DIAGNOSIS — Z8673 Personal history of transient ischemic attack (TIA), and cerebral infarction without residual deficits: Secondary | ICD-10-CM | POA: Insufficient documentation

## 2023-09-29 DIAGNOSIS — I5030 Unspecified diastolic (congestive) heart failure: Secondary | ICD-10-CM

## 2023-09-29 DIAGNOSIS — I081 Rheumatic disorders of both mitral and tricuspid valves: Secondary | ICD-10-CM | POA: Diagnosis not present

## 2023-09-29 DIAGNOSIS — I5032 Chronic diastolic (congestive) heart failure: Secondary | ICD-10-CM | POA: Insufficient documentation

## 2023-09-29 DIAGNOSIS — I272 Pulmonary hypertension, unspecified: Secondary | ICD-10-CM | POA: Diagnosis not present

## 2023-09-29 DIAGNOSIS — I4891 Unspecified atrial fibrillation: Secondary | ICD-10-CM

## 2023-09-29 DIAGNOSIS — Z79899 Other long term (current) drug therapy: Secondary | ICD-10-CM | POA: Insufficient documentation

## 2023-09-29 DIAGNOSIS — D6869 Other thrombophilia: Secondary | ICD-10-CM | POA: Diagnosis not present

## 2023-09-29 DIAGNOSIS — E876 Hypokalemia: Secondary | ICD-10-CM | POA: Diagnosis not present

## 2023-09-29 DIAGNOSIS — Z7901 Long term (current) use of anticoagulants: Secondary | ICD-10-CM | POA: Insufficient documentation

## 2023-09-29 DIAGNOSIS — I495 Sick sinus syndrome: Secondary | ICD-10-CM | POA: Diagnosis not present

## 2023-09-29 DIAGNOSIS — I700 Atherosclerosis of aorta: Secondary | ICD-10-CM | POA: Diagnosis not present

## 2023-09-29 DIAGNOSIS — E871 Hypo-osmolality and hyponatremia: Secondary | ICD-10-CM | POA: Diagnosis not present

## 2023-09-29 DIAGNOSIS — N179 Acute kidney failure, unspecified: Secondary | ICD-10-CM | POA: Diagnosis not present

## 2023-09-29 HISTORY — PX: CARDIOVERSION: EP1203

## 2023-09-29 LAB — PROTIME-INR
INR: 2.2 — ABNORMAL HIGH (ref 0.8–1.2)
Prothrombin Time: 24.7 s — ABNORMAL HIGH (ref 11.4–15.2)

## 2023-09-29 SURGERY — CARDIOVERSION (CATH LAB)
Anesthesia: General

## 2023-09-29 MED ORDER — SODIUM CHLORIDE 0.9% FLUSH: 3.0000 mL | INTRAVENOUS | Status: DC | PRN

## 2023-09-29 MED ORDER — LIDOCAINE 2% (20 MG/ML) 5 ML SYRINGE
INTRAMUSCULAR | Status: DC | PRN
  Administered 2023-09-29: 40 mg via INTRAVENOUS

## 2023-09-29 MED ORDER — EPHEDRINE SULFATE-NACL 50-0.9 MG/10ML-% IV SOSY
PREFILLED_SYRINGE | INTRAVENOUS | Status: DC | PRN
  Administered 2023-09-29 (×3): 5 mg via INTRAVENOUS

## 2023-09-29 MED ORDER — GLYCOPYRROLATE PF 0.2 MG/ML IJ SOSY
PREFILLED_SYRINGE | INTRAMUSCULAR | Status: DC | PRN
  Administered 2023-09-29: .1 mg via INTRAVENOUS

## 2023-09-29 MED ORDER — PROPOFOL 10 MG/ML IV BOLUS
INTRAVENOUS | Status: DC | PRN
  Administered 2023-09-29: 50 mg via INTRAVENOUS

## 2023-09-29 MED ORDER — SODIUM CHLORIDE 0.9% FLUSH: 3.0000 mL | Freq: Two times a day (BID) | INTRAVENOUS | Status: DC

## 2023-09-29 SURGICAL SUPPLY — 1 items: PAD DEFIB RADIO PHYSIO CONN (PAD) ×1 IMPLANT

## 2023-09-29 NOTE — Telephone Encounter (Signed)
Pt c/o BP issue: STAT if pt c/o blurred vision, one-sided weakness or slurred speech  1. What are your last 5 BP readings? 83/63  2. Are you having any other symptoms (ex. Dizziness, headache, blurred vision, passed out)? sob  3. What is your BP issue?    Patient had a cardioversion done this morning.

## 2023-09-29 NOTE — Telephone Encounter (Signed)
Spoke with DOD, Dr Nelly Laurence who advised pt not take Diltiazem or Metoprolol tonight.  Continue to monitor BP.  If pt becomes symptomatic should be further evaluated in the ED.

## 2023-09-29 NOTE — Interval H&P Note (Signed)
History and Physical Interval Note:  09/29/2023 9:28 AM  [name]  has presented today for surgery, with the diagnosis of AFIB.  The various methods of treatment have been discussed with the patient and family. After consideration of risks, benefits and other options for treatment, the patient has consented to  Procedure(s): CARDIOVERSION (N/A) as a surgical intervention.  The patient's history has been reviewed, patient examined, no change in status, stable for surgery.  I have reviewed the patient's chart and labs.  Questions were answered to the patient's satisfaction.     Hannah Downs

## 2023-09-29 NOTE — CV Procedure (Signed)
   Electrical Cardioversion Procedure Note Hannah Downs [id number] Jun 08, 1946  Procedure: Electrical Cardioversion Indications:  Atrial Fibrillation  Time Out: Verified patient identification, verified procedure,medications/allergies/relevent history reviewed, required imaging and test results available. performed  Procedure Details  The patient signed informed consent.   The patient was NPO past midnight. Has had therapeutic anticoagulation with Warfarin ( therapeutic INR today) greater than 3 weeks. The patient denies any interruption of anticoagulation.  Anesthesia was administered by Dr. Hart Rochester.  Adequate airway was maintained throughout and vital followed per protocol.  He was cardioverted x 1 with 200 J of biphasic synchronized energy.  He converted to NSR.  There were no apparent complications.  The patient tolerated the procedure well and had normal neuro status and respiratory status post procedure with vitals stable as recorded elsewhere.     IMPRESSION:  Successful cardioversion of atrial fibrillation.  Note: post procedure she has an episode of transient hypotension - refer to anesthesia full note.   Follow up:  We will follow with her .  He will continue on current medical therapy.  The patient advised to continue anticoagulation.  Hannah Downs 09/29/2023, 11:07 AM

## 2023-09-29 NOTE — Transfer of Care (Signed)
Immediate Anesthesia Transfer of Care Note  Patient: Hannah Downs  Procedure(s) Performed: CARDIOVERSION  Patient Location: PACU and Cath Lab  Anesthesia Type:General  Level of Consciousness: awake, alert , and oriented  Airway & Oxygen Therapy: Patient Spontanous Breathing and Patient connected to nasal cannula oxygen  Post-op Assessment: Report given to RN and Post -op Vital signs reviewed and stable  Post vital signs: Reviewed and stable  Last Vitals:  Vitals Value Taken Time  BP 106/72   Temp    Pulse 53   Resp 14   SpO2 95     Last Pain:  Vitals:   09/29/23 0901  TempSrc:   PainSc: 0-No pain         Complications: No notable events documented.

## 2023-09-29 NOTE — Telephone Encounter (Signed)
Spoke with pt's daughter, Trula Ore and advised per DOD pt should hold Diltiazem and Metoprolol tonight.  Continue to monitor BP and if she becomes symptomatic pt should be seen in the ED.  Pt's daughter verbalizes understanding and thanked Charity fundraiser for the call.

## 2023-09-29 NOTE — Telephone Encounter (Signed)
Spoke with pt's daughter, Hannah Downs and provided recommendations per Tereso Newcomer, PA-C as below.  Pt's daughter verbalizes understanding and will provide update tomorrow 09/30/2023.

## 2023-09-29 NOTE — Anesthesia Preprocedure Evaluation (Signed)
Anesthesia Evaluation  Patient identified by MRN, date of birth, ID band Patient awake    Reviewed: Allergy & Precautions, NPO status , Patient's Chart, lab work & pertinent test results  Airway Mallampati: II  TM Distance: >3 FB Neck ROM: Full    Dental  (+) Teeth Intact, Dental Advisory Given   Pulmonary    breath sounds clear to auscultation       Cardiovascular hypertension, + dysrhythmias Atrial Fibrillation  Rhythm:Irregular Rate:Abnormal  Echo:  1. Left ventricular ejection fraction, by estimation, is 60 to 65%. The  left ventricle has normal function. The left ventricle has no regional  wall motion abnormalities. Left ventricular diastolic parameters were  normal.   2. Right ventricular systolic function is normal. The right ventricular  size is normal. There is moderately elevated pulmonary artery systolic  pressure. The estimated right ventricular systolic pressure is 45.0 mmHg.   3. Left atrial size was upper limit of normal.   4. Right atrial size was dilated.   5. The mitral valve is normal in structure. Moderate mitral valve  regurgitation. No evidence of mitral stenosis.   6. Tricuspid valve regurgitation is moderate to severe.   7. The aortic valve is normal in structure. Aortic valve regurgitation is  not visualized. No aortic stenosis is present.   8. The inferior vena cava is dilated in size with <50% respiratory  variability, suggesting right atrial pressure of 15 mmHg.     Neuro/Psych  PSYCHIATRIC DISORDERS      CVA    GI/Hepatic Neg liver ROS,GERD  ,,  Endo/Other  negative endocrine ROS    Renal/GU negative Renal ROS     Musculoskeletal  (+) Arthritis ,    Abdominal   Peds  Hematology   Anesthesia Other Findings   Reproductive/Obstetrics                             Anesthesia Physical Anesthesia Plan  ASA: 3  Anesthesia Plan: General   Post-op Pain  Management: Minimal or no pain anticipated   Induction: Intravenous  PONV Risk Score and Plan: 0  Airway Management Planned: Natural Airway  Additional Equipment: None  Intra-op Plan:   Post-operative Plan:   Informed Consent: I have reviewed the patients History and Physical, chart, labs and discussed the procedure including the risks, benefits and alternatives for the proposed anesthesia with the patient or authorized representative who has indicated his/her understanding and acceptance.       Plan Discussed with: CRNA  Anesthesia Plan Comments:        Anesthesia Quick Evaluation

## 2023-09-29 NOTE — Anesthesia Postprocedure Evaluation (Signed)
Anesthesia Post Note  Patient: Hannah Downs  Procedure(s) Performed: CARDIOVERSION     Patient location during evaluation: PACU Anesthesia Type: General Level of consciousness: awake and alert Pain management: pain level controlled Vital Signs Assessment: post-procedure vital signs reviewed and stable Respiratory status: spontaneous breathing, nonlabored ventilation, respiratory function stable and patient connected to nasal cannula oxygen Cardiovascular status: blood pressure returned to baseline and stable Postop Assessment: no apparent nausea or vomiting Anesthetic complications: no  No notable events documented.  Last Vitals:  Vitals:   09/29/23 1110 09/29/23 1120  BP: 95/69 98/72  Pulse: (!) 51 (!) 51  Resp: (!) 34 18  Temp:    SpO2: 95% 95%    Last Pain:  Vitals:   09/29/23 1120  TempSrc:   PainSc: 0-No pain                 Shelton Silvas

## 2023-09-29 NOTE — Telephone Encounter (Signed)
Spoke with pt who gives verbal permission to speak with Trula Ore, daughter.  Pt's daughter reports pt completed DCCV this morning and was held longer than expected due to low BP and HR.  Pt denies CP, SOB or dizziness at this time.  Current BP at home 84/55 with HR - 48.  Pt took all of her morning medications today.  She was advised by the hospital staff to decrease Metoprolol 100mg  to 1/2 tablet (50mg ) bid.  Pt ate a 1/2 of a hamburger for lunch and drank a half bottle of water.  She states she believes she is currently in normal rhythm.  Pt's daughter advised to have pt increase fluid intake, have a salty snack and change positions slowly.  Continue to monitor HR and BP.  Will discuss with Tereso Newcomer, PA-C and contact pt with any further recommendations.  Pt's daughter verbalizes understanding and agrees with current plan.

## 2023-09-29 NOTE — Telephone Encounter (Signed)
Agree. Hold Dilt and Metoprolol tonight. Would resume Metoprolol tomorrow at 50 mg twice daily as recommended at the hospital after her DCCV. Would hold Diltiazem for now. Continue Amiodarone. Have pt call office tomorrow with update on symptoms, BP and HR. Tereso Newcomer, PA-C    09/29/2023 4:49 PM

## 2023-09-30 ENCOUNTER — Emergency Department (HOSPITAL_COMMUNITY): Payer: Medicare Other

## 2023-09-30 ENCOUNTER — Inpatient Hospital Stay (HOSPITAL_COMMUNITY)
Admission: EM | Admit: 2023-09-30 | Discharge: 2023-10-05 | DRG: 309 | Disposition: A | Discharge status: Home / Self Care | Payer: Medicare Other | Attending: Cardiology | Admitting: Cardiology

## 2023-09-30 ENCOUNTER — Other Ambulatory Visit: Payer: Self-pay

## 2023-09-30 ENCOUNTER — Encounter (HOSPITAL_COMMUNITY): Payer: Self-pay | Admitting: Cardiology

## 2023-09-30 DIAGNOSIS — R0789 Other chest pain: Secondary | ICD-10-CM | POA: Diagnosis not present

## 2023-09-30 DIAGNOSIS — I081 Rheumatic disorders of both mitral and tricuspid valves: Secondary | ICD-10-CM | POA: Diagnosis present

## 2023-09-30 DIAGNOSIS — E876 Hypokalemia: Secondary | ICD-10-CM | POA: Diagnosis not present

## 2023-09-30 DIAGNOSIS — K219 Gastro-esophageal reflux disease without esophagitis: Secondary | ICD-10-CM | POA: Diagnosis present

## 2023-09-30 DIAGNOSIS — E785 Hyperlipidemia, unspecified: Secondary | ICD-10-CM | POA: Diagnosis present

## 2023-09-30 DIAGNOSIS — Z7901 Long term (current) use of anticoagulants: Secondary | ICD-10-CM

## 2023-09-30 DIAGNOSIS — I700 Atherosclerosis of aorta: Secondary | ICD-10-CM | POA: Diagnosis present

## 2023-09-30 DIAGNOSIS — I10 Essential (primary) hypertension: Secondary | ICD-10-CM | POA: Diagnosis present

## 2023-09-30 DIAGNOSIS — D6869 Other thrombophilia: Secondary | ICD-10-CM | POA: Diagnosis present

## 2023-09-30 DIAGNOSIS — R0602 Shortness of breath: Secondary | ICD-10-CM | POA: Diagnosis not present

## 2023-09-30 DIAGNOSIS — I5032 Chronic diastolic (congestive) heart failure: Secondary | ICD-10-CM | POA: Diagnosis present

## 2023-09-30 DIAGNOSIS — R918 Other nonspecific abnormal finding of lung field: Secondary | ICD-10-CM | POA: Diagnosis not present

## 2023-09-30 DIAGNOSIS — Z8673 Personal history of transient ischemic attack (TIA), and cerebral infarction without residual deficits: Secondary | ICD-10-CM

## 2023-09-30 DIAGNOSIS — N179 Acute kidney failure, unspecified: Secondary | ICD-10-CM | POA: Diagnosis present

## 2023-09-30 DIAGNOSIS — Z96641 Presence of right artificial hip joint: Secondary | ICD-10-CM | POA: Diagnosis present

## 2023-09-30 DIAGNOSIS — R001 Bradycardia, unspecified: Principal | ICD-10-CM | POA: Diagnosis present

## 2023-09-30 DIAGNOSIS — M8580 Other specified disorders of bone density and structure, unspecified site: Secondary | ICD-10-CM | POA: Diagnosis present

## 2023-09-30 DIAGNOSIS — Z79899 Other long term (current) drug therapy: Secondary | ICD-10-CM

## 2023-09-30 DIAGNOSIS — I999 Unspecified disorder of circulatory system: Secondary | ICD-10-CM | POA: Diagnosis present

## 2023-09-30 DIAGNOSIS — I480 Paroxysmal atrial fibrillation: Secondary | ICD-10-CM

## 2023-09-30 DIAGNOSIS — Z823 Family history of stroke: Secondary | ICD-10-CM

## 2023-09-30 DIAGNOSIS — I495 Sick sinus syndrome: Principal | ICD-10-CM | POA: Diagnosis present

## 2023-09-30 DIAGNOSIS — Z888 Allergy status to other drugs, medicaments and biological substances status: Secondary | ICD-10-CM

## 2023-09-30 DIAGNOSIS — I4819 Other persistent atrial fibrillation: Secondary | ICD-10-CM | POA: Diagnosis present

## 2023-09-30 DIAGNOSIS — E871 Hypo-osmolality and hyponatremia: Secondary | ICD-10-CM | POA: Diagnosis present

## 2023-09-30 DIAGNOSIS — I272 Pulmonary hypertension, unspecified: Secondary | ICD-10-CM | POA: Diagnosis present

## 2023-09-30 DIAGNOSIS — M1990 Unspecified osteoarthritis, unspecified site: Secondary | ICD-10-CM | POA: Diagnosis present

## 2023-09-30 DIAGNOSIS — I110 Hypertensive heart disease with heart failure: Secondary | ICD-10-CM | POA: Diagnosis present

## 2023-09-30 LAB — CBC
HCT: 37.7 % (ref 36.0–46.0)
Hemoglobin: 12.6 g/dL (ref 12.0–15.0)
MCH: 30.7 pg (ref 26.0–34.0)
MCHC: 33.4 g/dL (ref 30.0–36.0)
MCV: 91.7 fL (ref 80.0–100.0)
Platelets: 271 10*3/uL (ref 150–400)
RBC: 4.11 MIL/uL (ref 3.87–5.11)
RDW: 13.6 % (ref 11.5–15.5)
WBC: 8.8 10*3/uL (ref 4.0–10.5)
nRBC: 0 % (ref 0.0–0.2)

## 2023-09-30 LAB — BRAIN NATRIURETIC PEPTIDE: B Natriuretic Peptide: 606.7 pg/mL — ABNORMAL HIGH (ref 0.0–100.0)

## 2023-09-30 LAB — BASIC METABOLIC PANEL
Anion gap: 14 (ref 5–15)
BUN: 22 mg/dL (ref 8–23)
CO2: 22 mmol/L (ref 22–32)
Calcium: 9.4 mg/dL (ref 8.9–10.3)
Chloride: 92 mmol/L — ABNORMAL LOW (ref 98–111)
Creatinine, Ser: 1.26 mg/dL — ABNORMAL HIGH (ref 0.44–1.00)
GFR, Estimated: 44 mL/min — ABNORMAL LOW (ref >60)
Glucose, Bld: 140 mg/dL — ABNORMAL HIGH (ref 70–99)
Potassium: 4 mmol/L (ref 3.5–5.1)
Sodium: 128 mmol/L — ABNORMAL LOW (ref 135–145)

## 2023-09-30 LAB — PROTIME-INR
INR: 2.4 — ABNORMAL HIGH (ref 0.8–1.2)
Prothrombin Time: 26.2 s — ABNORMAL HIGH (ref 11.4–15.2)

## 2023-09-30 LAB — TROPONIN I (HIGH SENSITIVITY)
Troponin I (High Sensitivity): 9 ng/L (ref <18)
Troponin I (High Sensitivity): 9 ng/L (ref <18)

## 2023-09-30 LAB — TSH: TSH: 4.856 u[IU]/mL — ABNORMAL HIGH (ref 0.350–4.500)

## 2023-09-30 MED ORDER — WARFARIN - PHARMACIST DOSING INPATIENT: Freq: Every day | Status: DC

## 2023-09-30 MED ORDER — WARFARIN SODIUM 2.5 MG PO TABS
2.5000 mg | ORAL_TABLET | Freq: Once | ORAL | Status: AC
Start: 2023-09-30 — End: 2023-09-30
  Administered 2023-09-30: 2.5 mg via ORAL
  Filled 2023-09-30: qty 1

## 2023-09-30 MED ORDER — TROSPIUM CHLORIDE 20 MG PO TABS
20.0000 mg | ORAL_TABLET | Freq: Two times a day (BID) | ORAL | Status: DC
  Administered 2023-10-01 – 2023-10-05 (×9): 20 mg via ORAL
  Filled 2023-09-30 (×13): qty 1

## 2023-09-30 NOTE — ED Notes (Signed)
ED TO INPATIENT HANDOFF REPORT  ED Nurse Name and Phone #: Topher [phone]  S Name/Age/Gender Hannah Downs 78 y.o. female Room/Bed: 035C/035C  Code Status   Code Status: Full Code  Home/SNF/Other Home Patient oriented to: self, place, time, and situation Is this baseline? Yes   Triage Complete: Triage complete  Chief Complaint Junctional bradycardia [R00.1]  Triage Note Pt presents POV for SOB and chest pressure started yesterday after coming home. Pt states she was cardioverted yesterday for afib. A&O x 4. Pt also complains of dry mouth, thirst. No hx of diabetes    Allergies Allergies  Allergen Reactions   Lisinopril Cough   Thiazide-Type Diuretics     hyponatremia    Level of Care/Admitting Diagnosis ED Disposition     ED Disposition  Admit   Condition  --   Comment  Hospital Area: [facility] [[id number]]  Level of Care: Telemetry Cardiac [103]  May place patient in observation at [facility] or [facility] if equivalent level of care is available:: No  Covid Evaluation: Asymptomatic - no recent exposure (last 10 days) testing not required  Diagnosis: Junctional bradycardia [409811]  Admitting Physician: Lewayne Bunting [[id number]]  Attending Physician: Lewayne Bunting [[id number]]          B Medical/Surgery History Past Medical History:  Diagnosis Date   Anticoagulated on Coumadin    managed by coumadin clinic   Arthritis    Dyspnea    exertion   GERD (gastroesophageal reflux disease)    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   neurologist--- dr Pearlean Brownie;   11-06-2021  left frontoparietal cortical embolic infarct secondary AFib whilie off anticoagulant for emergent surgery then  post surgery 11-12-2022 on eliquis , acute right cerebellar PICA territory infarct ;  pt now on chronic coumadin, managed by clinic   Hyperlipidemia    Hypertension    Lumbar degenerative disc disease    Osteopenia    PAF  (paroxysmal atrial fibrillation) (HCC) 2021   cardiologist-- dr Melburn Popper;  evaluted event monitor 03-26-2020 showed 1% burden  Afib   Presence of pessary    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   Seasonal allergies    Varicose vein    right leg   Vitamin D deficiency    Wears glasses    Past Surgical History:  Procedure Laterality Date   CARDIOVERSION N/A 08/12/2023   Procedure: CARDIOVERSION;  Surgeon: Sande Rives, MD;  Location: [facility] INVAS[facility] CV LAB;  Service: Cardiovascular;  Laterality: N/A;   CARDIOVERSION N/A 09/29/2023   Procedure: CARDIOVERSION;  Surgeon: Thomasene Ripple, DO;  Location: [facility] INVASIVE CV LAB;  Service: Cardiovascular;  Laterality: N/A;   COLPOCLEISIS N/A 06/20/2023   Procedure: LEFORTE COLPOCLEISIS; LEVATOR PLICATION WITH PERINEORRHAPHY;  Surgeon: Marguerita Beards, MD;  Location: [facility];  Service: Gynecology;  Laterality: N/A;   CYSTOSCOPY N/A 06/20/2023   Procedure: CYSTOSCOPY;  Surgeon: Marguerita Beards, MD;  Location: [facility];  Service: Gynecology;  Laterality: N/A;   DILATION AND CURETTAGE OF UTERUS  1998   FEMORAL HERNIA REPAIR Left 11/02/2006   @[facility]   by dr Grier Mitts;   Emergent Inguinal exploratory & primary repair femoral hernia and Reduction of small bowel obstruction   INGUINAL HERNIA REPAIR Left 02/03/2007   @[facility]  by dr Grier Mitts;   Open recurrent repair [facility] w/ mesh   INGUINAL HERNIA REPAIR N/A 11/04/2021   Procedure: lysis  of adhesions reduction of incarcerated recurrent left femoral hernia laparoscopic repair. laparoscopic right femoral hernia repair and right obturator repair with mesh. tap block;  Surgeon: Karie Soda, MD;  Location: [facility];  Service: General;  Laterality: N/A;   ROBOTIC ASSISTED DIAGNOSTIC LAPAROSCOPY  09/18/2021   @[facility]  by dr Judie Petit. Daphine Deutscher;   ATTEMPTED  reduction recurrent inguinal hernia,  aborted due to involvement of bladder and possible vagina    TOTAL HIP ARTHROPLASTY Right 06/26/2014   Procedure: RIGHT TOTAL HIP ARTHROPLASTY ANTERIOR APPROACH WITH ACETABULAR AUTOGRAFT;  Surgeon: Loanne Drilling, MD;  Location: [facility];  Service: Orthopedics;  Laterality: Right;     A IV Location/Drains/Wounds Patient Lines/Drains/Airways Status     Active Line/Drains/Airways     None            Intake/Output Last 24 hours No intake or output data in the 24 hours ending 09/30/23 1841  Labs/Imaging Results for orders placed or performed during the hospital encounter of 09/30/23 (from the past 48 hours)  Brain natriuretic peptide     Status: Abnormal   Collection Time: 09/30/23  1:16 PM  Result Value Ref Range   B Natriuretic Peptide 606.7 (H) 0.0 - 100.0 pg/mL    Comment: Performed at [facility], [address], [place], [place] [address]  Basic metabolic panel     Status: Abnormal   Collection Time: 09/30/23  1:18 PM  Result Value Ref Range   Sodium 128 (L) 135 - 145 mmol/L   Potassium 4.0 3.5 - 5.1 mmol/L   Chloride 92 (L) 98 - 111 mmol/L   CO2 22 22 - 32 mmol/L   Glucose, Bld 140 (H) 70 - 99 mg/dL    Comment: Glucose reference range applies only to samples taken after fasting for at least 8 hours.   BUN 22 8 - 23 mg/dL   Creatinine, Ser 6.04 (H) 0.44 - 1.00 mg/dL   Calcium 9.4 8.9 - 54.0 mg/dL   GFR, Estimated 44 (L) >60 mL/min    Comment: (NOTE) Calculated using the CKD-EPI Creatinine Equation (2021)    Anion gap 14 5 - 15    Comment: Performed at [facility], [address], [place], [place] [address]  CBC     Status: None   Collection Time: 09/30/23  1:18 PM  Result Value Ref Range   WBC 8.8 4.0 - 10.5 K/uL   RBC 4.11 3.87 - 5.11 MIL/uL   Hemoglobin 12.6 12.0 - 15.0 g/dL   HCT 14.7 82.9 - 56.2 %   MCV 91.7 80.0 - 100.0 fL   MCH 30.7 26.0 - 34.0 pg   MCHC 33.4 30.0 - 36.0 g/dL   RDW 13.0 86.5 - 78.4 %   Platelets 271 150 - 400 K/uL   nRBC 0.0 0.0 - 0.2 %    Comment: Performed at [facility], [address], [place], [place] [address]  Troponin I (High Sensitivity)     Status: None   Collection Time: 09/30/23  1:18 PM  Result Value Ref Range   Troponin I (High Sensitivity) 9 <18 ng/L    Comment: (NOTE) Elevated high sensitivity troponin I (hsTnI) values and significant  changes across serial measurements may suggest ACS but many other  chronic and acute conditions are known to elevate hsTnI results.  Refer to the "Links" section for chest pain algorithms and additional  guidance. Performed at [facility], [address], [place], [place] [address]  Protime-INR (order if Patient is taking Coumadin / Warfarin)     Status: Abnormal   Collection Time: 09/30/23  1:18 PM  Result Value Ref Range   Prothrombin Time 26.2 (H) 11.4 - 15.2 seconds   INR 2.4 (H) 0.8 - 1.2    Comment: (NOTE) INR goal varies based on device and disease states. Performed at [facility], [address] [place], [place] [address]   Troponin I (High Sensitivity)     Status: None   Collection Time: 09/30/23  5:19 PM  Result Value Ref Range   Troponin I (High Sensitivity) 9 <18 ng/L    Comment: (NOTE) Elevated high sensitivity troponin I (hsTnI) values and significant  changes across serial measurements may suggest ACS but many other  chronic and acute conditions are known to elevate hsTnI results.  Refer to the "Links" section for chest pain algorithms and additional  guidance. Performed at [facility], [address] [place], [place] [address]    DG Chest 2 View Result Date: 09/30/2023 CLINICAL DATA:  Short of breath, chest pressure, history of atrial fibrillation with cardioversion yesterday EXAM: CHEST - 2 VIEW COMPARISON:  06/23/2023 FINDINGS: Frontal and lateral views of the chest demonstrates stable enlargement of the cardiac silhouette. Minimal residual blunting of the right costophrenic angle may reflect pleural thickening or trace effusion. No airspace disease  or pneumothorax. No acute bony abnormalities. IMPRESSION: 1. Trace residual right pleural effusion versus pleural thickening. 2. Stable enlarged cardiac silhouette. Electronically Signed   By: Sharlet Salina M.D.   On: 09/30/2023 14:59   EP STUDY Result Date: 09/30/2023 See surgical note for result.   Pending Labs Unresulted Labs (From admission, onward)     Start     Ordered   10/01/23 0500  Protime-INR  Daily,   R      09/30/23 1756   10/01/23 0500  CBC  Daily,   R      09/30/23 1756   09/30/23 1648  TSH  Once,   URGENT        09/30/23 1647   Signed and Held  CBC  Tomorrow morning,   R        Signed and Held   Signed and Held  Comprehensive metabolic panel  Tomorrow morning,   R        Signed and Held            Vitals/Pain Today's Vitals   09/30/23 1303 09/30/23 1304 09/30/23 1631  BP: 130/87  136/85  Pulse: (!) 51  (!) 51  Resp: 20  20  Temp: 97.8 F (36.6 C)  98 F (36.7 C)  TempSrc:   Oral  SpO2: 96%  97%  Weight:  55.9 kg   Height:  4\' 11"  (1.499 m)   PainSc:  0-No pain     Isolation Precautions No active isolations  Medications Medications  warfarin (COUMADIN) tablet 2.5 mg (has no administration in time range)  Warfarin - Pharmacist Dosing Inpatient (has no administration in time range)  trospium (SANCTURA) tablet 20 mg (has no administration in time range)    Mobility walks with device     Focused Assessments Cardiac Assessment Handoff:  Cardiac Rhythm: Normal sinus rhythm No results found for: "CKTOTAL", "CKMB", "CKMBINDEX", "TROPONINI" No results found for: "DDIMER" Does the Patient currently have chest pain? No    R Recommendations: See Admitting Provider Note  Report given to:   Additional Notes:

## 2023-09-30 NOTE — Telephone Encounter (Signed)
Spoke with the patient and advised her on recommendations from [place]. Patient verbalized understanding.   I also spoke with the patient's daughter and made her aware. She states that she is going to call her other sister and one of them will take her.

## 2023-09-30 NOTE — Telephone Encounter (Signed)
Agree. Pt needs to go to the ED now. She may be in heart block. Do not take Metoprolol or Diltiazem until seen in the ED. Tereso Newcomer, PA-C    09/30/2023 12:05 PM

## 2023-09-30 NOTE — Progress Notes (Signed)
Daughter had noted they previously had to bring in pt's bladder med from home. Relayed to pharmacist to help re-order.

## 2023-09-30 NOTE — ED Provider Notes (Signed)
[place] EMERGENCY DEPARTMENT AT [facility] Provider Note  CSN: [id number] Arrival date & time: 09/30/23 1251  Chief Complaint(s) Chest Pain and Shortness of Breath  HPI Hannah Downs is a 78 y.o. female history of atrial for lesion on Coumadin, hypertension, hyperlipidemia, prior stroke presenting to the emergency department with generalized weakness and fatigue.  Patient reports generalized weakness, fatigue, shortness of breath since cardioversion yesterday.  Heart rate was reportedly low after cardioversion but patient was sent home.  She was told to hold her diltiazem.  Today her symptoms were persistent so cardiology advised her to come to the emergency department for evaluation.  She reports chronic unchanged right lower extremity swelling.  She reports shortness of breath and chest pressure with exertion.  No fevers or chills.  No cough.  No urinary symptoms.  No abdominal or back pain.   Past Medical History Past Medical History:  Diagnosis Date   Anticoagulated on Coumadin    managed by coumadin clinic   Arthritis    Dyspnea    exertion   GERD (gastroesophageal reflux disease)    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   neurologist--- dr Pearlean Brownie;   11-06-2021  left frontoparietal cortical embolic infarct secondary AFib whilie off anticoagulant for emergent surgery then  post surgery 11-12-2022 on eliquis , acute right cerebellar PICA territory infarct ;  pt now on chronic coumadin, managed by clinic   Hyperlipidemia    Hypertension    Lumbar degenerative disc disease    Osteopenia    PAF (paroxysmal atrial fibrillation) (HCC) 2021   cardiologist-- dr Melburn Popper;  evaluted event monitor 03-26-2020 showed 1% burden  Afib   Presence of pessary    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   Seasonal allergies    Varicose vein    right leg   Vitamin D deficiency    Wears glasses     Patient Active Problem List   Diagnosis Date Noted   Junctional bradycardia 09/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   Nonrheumatic mitral valve regurgitation 07/19/2023   Atrial fibrillation with RVR (HCC) 06/22/2023   (HFpEF) heart failure with preserved ejection fraction (HCC) 06/22/2023   Atrial fib/flutter, transient (HCC) 06/20/2023   Atrial fibrillation (HCC) 06/20/2023   Hypotension 06/20/2023   Hx of stroke without residual deficits 06/20/2023   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 organs 06/20/2023   Postoperative seroma involving digestive system after digestive system procedure 11/12/2021   CVA (cerebral vascular accident) (HCC) 11/11/2021   Stroke (HCC) 11/07/2021   Asymptomatic bacteriuria 11/06/2021   High bilirubin 11/06/2021   COVID-19 virus detected 11/05/2021   Hyponatremia 11/05/2021   Incarcerated femoral hernia 11/05/2021   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 11/05/2021 11/05/2021   Femoral hernia of left side 11/05/2021   Hypomagnesemia 11/05/2021   Leukocytosis 11/05/2021   SBO (small bowel obstruction) (HCC) 11/04/2021   Chronic anticoagulation 11/04/2021   Lumbar degenerative disc disease 11/04/2021   Seasonal allergies 11/04/2021   Adjustment disorder 10/30/2021   Acquired thrombophilia (HCC) 10/30/2021   Aortic atherosclerosis (HCC) 08/04/2021   Essential hypertension 08/04/2021   Hyperlipidemia 08/04/2021   Gastroesophageal reflux disease without esophagitis 08/04/2021   Vitamin D deficiency 08/04/2021   Paroxysmal atrial fibrillation (HCC) 04/02/2020   OA (osteoarthritis) of hip 06/26/2014   Home Medication(s) Prior to Admission medications   Medication Sig Start Date End Date Taking? Authorizing Provider  acetaminophen (  TYLENOL) 500 MG tablet Take 1,000 mg by mouth every 6 (six) hours as needed for mild pain (pain score 1-3) or moderate pain (pain score 4-6).    [provider]   amiodarone (PACERONE) 200 MG tablet Take 200 mg by mouth daily.    [provider]  atorvastatin (LIPITOR) 20 MG tablet Take 1 tablet by mouth once daily 12/09/22   Nahser, Deloris Ping, MD  Cholecalciferol (VITAMIN D3) 50 MCG (2000 UT) TABS Take 2,000 Units by mouth in the morning.    [provider]  diltiazem (CARDIZEM CD) 120 MG 24 hr capsule Take 1 capsule (120 mg total) by mouth in the morning and at bedtime. 07/19/23   Tereso Newcomer T, PA-C  estradiol (ESTRACE) 0.1 MG/GM vaginal cream PLACE 0.5 GRAMS VAGINALLY TWICE A WEEK 03/28/23   Selmer Dominion, NP  furosemide (LASIX) 40 MG tablet Take 1 tablet (40 mg total) by mouth daily. 08/10/23 11/08/23  Tereso Newcomer T, PA-C  guaiFENesin (ROBITUSSIN) 100 MG/5ML liquid Take 5 mLs by mouth every 4 (four) hours as needed for cough or to loosen phlegm. 06/24/23   Rai, Delene Ruffini, MD  metoprolol tartrate (LOPRESSOR) 100 MG tablet Take 1 tablet (100 mg total) by mouth 2 (two) times daily. 06/24/23   Rai, Delene Ruffini, MD  omeprazole (PRILOSEC OTC) 20 MG tablet Take 20 mg by mouth daily.    [provider]  potassium chloride (KLOR-CON) 10 MEQ tablet Take 1 tablet by mouth once daily 06/21/23   Nahser, Deloris Ping, MD  trospium (SANCTURA) 20 MG tablet Take 1 tablet (20 mg total) by mouth 2 (two) times daily. 01/13/23   Selmer Dominion, NP  warfarin (COUMADIN) 5 MG tablet TAKE 1 TO 1 & 1/2 (ONE & ONE-HALF) TABLETS BY MOUTH ONCE DAILY Patient taking differently: Take 2.5 mg by mouth daily. 08/31/23   Nahser, Deloris Ping, MD                                                                                                                                    Past Surgical History Past Surgical History:  Procedure Laterality Date   CARDIOVERSION N/A 08/12/2023   Procedure: CARDIOVERSION;  Surgeon: Sande Rives, MD;  Location: [facility] INVASIVE CV LAB;  Service: Cardiovascular;  Laterality: N/A;   CARDIOVERSION N/A 09/29/2023   Procedure:  CARDIOVERSION;  Surgeon: Thomasene Ripple, DO;  Location: [facility] INVASIVE CV LAB;  Service: Cardiovascular;  Laterality: N/A;   COLPOCLEISIS N/A 06/20/2023   Procedure: LEFORTE COLPOCLEISIS; LEVATOR PLICATION WITH PERINEORRHAPHY;  Surgeon: Marguerita Beards, MD;  Location: [facility];  Service: Gynecology;  Laterality: N/A;   CYSTOSCOPY N/A 06/20/2023   Procedure: CYSTOSCOPY;  Surgeon: Marguerita Beards, MD;  Location: [facility];  Service: Gynecology;  Laterality: N/A;   DILATION AND CURETTAGE OF UTERUS  1998   FEMORAL HERNIA REPAIR Left 11/02/2006   @[facility]   by  dr Grier Mitts;   Emergent Inguinal exploratory & primary repair femoral hernia and Reduction of small bowel obstruction   INGUINAL HERNIA REPAIR Left 02/03/2007   @[facility]  by dr Grier Mitts;   Open recurrent repair [facility] w/ mesh   INGUINAL HERNIA REPAIR N/A 11/04/2021   Procedure: lysis of adhesions reduction of incarcerated recurrent left femoral hernia laparoscopic repair. laparoscopic right femoral hernia repair and right obturator repair with mesh. tap block;  Surgeon: Karie Soda, MD;  Location: [facility];  Service: General;  Laterality: N/A;   ROBOTIC ASSISTED DIAGNOSTIC LAPAROSCOPY  09/18/2021   @[facility]  by dr Judie Petit. Daphine Deutscher;   ATTEMPTED  reduction recurrent inguinal hernia,  aborted due to involvement of bladder and possible vagina   TOTAL HIP ARTHROPLASTY Right 06/26/2014   Procedure: RIGHT TOTAL HIP ARTHROPLASTY ANTERIOR APPROACH WITH ACETABULAR AUTOGRAFT;  Surgeon: Loanne Drilling, MD;  Location: [facility];  Service: Orthopedics;  Laterality: Right;   Family History Family History  Problem Relation Age of Onset   Cancer Mother    Stroke Father     Social History Social History   Tobacco Use   Smoking status: Never   Smokeless tobacco: Never  Vaping Use   Vaping status: Never Used  Substance Use Topics   Alcohol use: No   Drug use: Never   Allergies Lisinopril and Thiazide-type diuretics  Review of  Systems Review of Systems  All other systems reviewed and are negative.   Physical Exam Vital Signs  I have reviewed the triage vital signs BP 136/85 (BP Location: Left Arm)   Pulse (!) 51   Temp 98 F (36.7 C) (Oral)   Resp 20   Ht 4\' 11"  (1.499 m)   Wt 55.9 kg   SpO2 97%   BMI 24.89 kg/m  Physical Exam Vitals and nursing note reviewed.  Constitutional:      General: She is not in acute distress.    Appearance: She is well-developed.  HENT:     Head: Normocephalic and atraumatic.     Mouth/Throat:     Mouth: Mucous membranes are moist.  Eyes:     Pupils: Pupils are equal, round, and reactive to light.  Cardiovascular:     Rate and Rhythm: Regular rhythm. Bradycardia present.     Heart sounds: No murmur heard. Pulmonary:     Effort: Pulmonary effort is normal. No respiratory distress.     Breath sounds: Normal breath sounds.  Abdominal:     General: Abdomen is flat.     Palpations: Abdomen is soft.     Tenderness: There is no abdominal tenderness.  Musculoskeletal:     Right lower leg: No tenderness. Edema present.     Left lower leg: No edema.  Skin:    General: Skin is warm and dry.  Neurological:     General: No focal deficit present.     Mental Status: She is alert. Mental status is at baseline.  Psychiatric:        Mood and Affect: Mood normal.        Behavior: Behavior normal.     ED Results and Treatments Labs (all labs ordered are listed, but only abnormal results are displayed) Labs Reviewed  BASIC METABOLIC PANEL - Abnormal; Notable for the following components:      Result Value   Sodium 128 (*)    Chloride 92 (*)    Glucose, Bld 140 (*)    Creatinine, Ser 1.26 (*)    GFR, Estimated 44 (*)  All other components within normal limits  PROTIME-INR - Abnormal; Notable for the following components:   Prothrombin Time 26.2 (*)    INR 2.4 (*)    All other components within normal limits  BRAIN NATRIURETIC PEPTIDE - Abnormal; Notable for the  following components:   B Natriuretic Peptide 606.7 (*)    All other components within normal limits  CBC  TSH  PROTIME-INR  CBC  TROPONIN I (HIGH SENSITIVITY)  TROPONIN I (HIGH SENSITIVITY)                                                                                                                          Radiology DG Chest 2 View Result Date: 09/30/2023 CLINICAL DATA:  Short of breath, chest pressure, history of atrial fibrillation with cardioversion yesterday EXAM: CHEST - 2 VIEW COMPARISON:  06/23/2023 FINDINGS: Frontal and lateral views of the chest demonstrates stable enlargement of the cardiac silhouette. Minimal residual blunting of the right costophrenic angle may reflect pleural thickening or trace effusion. No airspace disease or pneumothorax. No acute bony abnormalities. IMPRESSION: 1. Trace residual right pleural effusion versus pleural thickening. 2. Stable enlarged cardiac silhouette. Electronically Signed   By: Sharlet Salina M.D.   On: 09/30/2023 14:59    Pertinent labs & imaging results that were available during my care of the patient were reviewed by me and considered in my medical decision making (see MDM for details).  Medications Ordered in ED Medications  warfarin (COUMADIN) tablet 2.5 mg (has no administration in time range)  Warfarin - Pharmacist Dosing Inpatient (has no administration in time range)  trospium (SANCTURA) tablet 20 mg (has no administration in time range)                                                                                                                                     Procedures Procedures  (including critical care time)  Medical Decision Making / ED Course   MDM:  78 year old presenting to the emergency department with weakness.  Vital signs notable for bradycardia.  ECG shows possible junctional rhythm.  Appears unchanged from ECG yesterday.  No evidence of STEMI.  Concern for possible bradycardia arrhythmia  secondary to cardioversion.  Patient is overall well-appearing.  She is not hypotensive.  Will discuss with cardiology so they can evaluate the patient.  Her troponin is negative.  Labs notable for mild hyponatremia, AKI.  Differential includes cardiogenic due to bradycardia or mild dehydration.  She does have right lower extremity swelling which she reports is chronic and unchanged.  Clinical Course as of 09/30/23 1801  Fri Sep 30, 2023  1759 Patient admitted by cardiology service for bradycardia  [WS]    Clinical Course User Index [WS] Lonell Grandchild, MD     Additional history obtained: -Additional history obtained from family -External records from outside source obtained and reviewed including: Chart review including previous notes, labs, imaging, consultation notes including cardiology notes    Lab Tests: -I ordered, reviewed, and interpreted labs.   The pertinent results include:   Labs Reviewed  BASIC METABOLIC PANEL - Abnormal; Notable for the following components:      Result Value   Sodium 128 (*)    Chloride 92 (*)    Glucose, Bld 140 (*)    Creatinine, Ser 1.26 (*)    GFR, Estimated 44 (*)    All other components within normal limits  PROTIME-INR - Abnormal; Notable for the following components:   Prothrombin Time 26.2 (*)    INR 2.4 (*)    All other components within normal limits  BRAIN NATRIURETIC PEPTIDE - Abnormal; Notable for the following components:   B Natriuretic Peptide 606.7 (*)    All other components within normal limits  CBC  TSH  PROTIME-INR  CBC  TROPONIN I (HIGH SENSITIVITY)  TROPONIN I (HIGH SENSITIVITY)    Notable for mild hyponatremia, AKI, elevated BNP   EKG   EKG Interpretation Date/Time:  Friday September 30 2023 13:06:22 EST Ventricular Rate:  51 PR Interval:    QRS Duration:  90 QT Interval:  478 QTC Calculation: 440 R Axis:   99  Text Interpretation: Junctional rhythm Rightward axis Abnormal ECG When compared with ECG of  29-Sep-2023 10:56, No significant change since last tracing Confirmed by Alvino Blood ([id number]) on 09/30/2023 4:20:25 PM         Imaging Studies ordered: I ordered imaging studies including CXR On my interpretation imaging demonstrates no focal process I independently visualized and interpreted imaging. I agree with the radiologist interpretation   Medicines ordered and prescription drug management: Meds ordered this encounter  Medications   warfarin (COUMADIN) tablet 2.5 mg   Warfarin - Pharmacist Dosing Inpatient   trospium (SANCTURA) tablet 20 mg    -I have reviewed the patients home medicines and have made adjustments as needed   Consultations Obtained: I requested consultation with the cardiologist,  and discussed lab and imaging findings as well as pertinent plan - they recommend: admission for observation   Cardiac Monitoring: The patient was maintained on a cardiac monitor.  I personally viewed and interpreted the cardiac monitored which showed an underlying rhythm of: junctional bradycardia     Reevaluation: After the interventions noted above, I reevaluated the patient and found that their symptoms have stayed the same  Co morbidities that complicate the patient evaluation  Past Medical History:  Diagnosis Date   Anticoagulated on Coumadin    managed by coumadin clinic   Arthritis    Dyspnea    exertion   GERD (gastroesophageal reflux disease)    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   neurologist--- dr Pearlean Brownie;   11-06-2021  left frontoparietal cortical embolic infarct secondary AFib whilie off anticoagulant for emergent surgery then  post surgery 11-12-2022 on eliquis , acute right cerebellar PICA territory infarct ;  pt now on chronic coumadin, managed by clinic  Hyperlipidemia    Hypertension    Lumbar degenerative disc disease    Osteopenia    PAF (paroxysmal atrial fibrillation) (HCC) 2021   cardiologist-- dr  Melburn Popper;  evaluted event monitor 03-26-2020 showed 1% burden  Afib   Presence of pessary    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   Seasonal allergies    Varicose vein    right leg   Vitamin D deficiency    Wears glasses       Dispostion: Disposition decision including need for hospitalization was considered, and patient admitted to the hospital.    Final Clinical Impression(s) / ED Diagnoses Final diagnoses:  Junctional bradycardia     This chart was dictated using voice recognition software.  Despite best efforts to proofread,  errors can occur which can change the documentation meaning.    Lonell Grandchild, MD 09/30/23 [phone]

## 2023-09-30 NOTE — H&P (Addendum)
Cardiology Admission History and Physical   Patient ID: Hannah Downs MRN: [id number]; DOB: 1946-06-08   Admission date: 09/30/2023  PCP:  [name], MD   [facility] Providers Cardiologist:  Christell Constant, MD  Cardiology APP:  Beatrice Lecher, PA-C       Chief Complaint:  weak, SOB, chest pressure  Patient Profile:   Hannah Downs is a 78 y.o. female with Persistent atrial fibrillation, chronic HFpEF with R>L LE edema, moderate mitral regurgitation, moderate to severe tricuspid regurgitation, HTN, HLD, aortic atherosclerosis, GERD, CVA (10/2021, 11/2021) who is being seen 09/30/2023 for the evaluation of junctional rhythm, chest pressure, SOB and weakness.Marland Kitchen  History of Present Illness:   Hannah Downs has the above history. Hannah Downs was previously followed by Dr. Elease Hashimoto then would be transitioning care in the future to Dr. Izora Ribas given Dr. Harvie Bridge impending retirement (Dr. Izora Ribas saw her in 06/2023 and also cares for her daughter). Hannah Downs had an acute CVA after Eliquis was held for an emergent abdominal surgery in 10/2021. Echo at that time reassuring. Hannah Downs was resumed on Eliquis but presented with another CVA in 11/2021. Hannah Downs was switched to Coumadin at that time with plans for Lovenox bridge for any additional surgical procedures in the future. Hannah Downs developed recurrent AF this year and underwent DCCV 07/2023 but did not hold sinus so was started on amiodarone with plan to re-trial DCCV. OP PFTs are pending as well. TSH 06/2023 was wnl. F/u echo 06/2023 EF 60-65%, moderately elevated PASP, ULN LA size, moderate MR.  Hannah Downs was seen in the office 09/23/23, still in atrial fib despite amiodarone. Hannah Downs was felt to be adequately loaded with amio so dose was reduced to 200mg  daily. Hannah Downs was continued on metoprolol 100mg  BID and diltiazem 120mg  daily and sent for DCCV which was done yesterday. Post-DCCV EKG showed junctional rhythm 53bpm. Patient reports they were  verbally told to decrease metoprolol to 1/2 tablet twice a day (50mg  BID) at discharge. Hannah Downs and her family called the office yesterday regarding low HR 48bpm and BP 84/55. Hannah Downs was instructed to hold diltiazem and metoprolol then restart 50mg  BID in the morning and to continue amiodarone.   This morning Hannah Downs took the metoprolol and amiodarone and excluded the diltiazem as instructed. However, Hannah Downs continued to feel like Hannah Downs might pass out as well as some SOB and generalized chest tightness with exertion. No overt chest pain. Hannah Downs has also noticed some dry mouth, increased thirst, and generalized fatigue. Hannah Downs has had poor appetite for some time now. Labs show new hyponatremia of 128, Cr 1.26, BNP 606, hsTroponin neg x1, CBC wnl, INR 2.4. Hannah Downs took her Lasix, potassium, atorvastatin, [place]ura (for bladder) this AM. Hannah Downs takes her Coumadin QPM and is due for dose. CXR shows trace residual right pleural effusion versus pleural thickening, stable enlarged cardiac silhouette.  Past Medical History:  Diagnosis Date   Anticoagulated on Coumadin    managed by coumadin clinic   Arthritis    Dyspnea    exertion   GERD (gastroesophageal reflux disease)    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   neurologist--- dr Pearlean Brownie;   11-06-2021  left frontoparietal cortical embolic infarct secondary AFib whilie off anticoagulant for emergent surgery then  post surgery 11-12-2022 on eliquis , acute right cerebellar PICA territory infarct ;  pt now on chronic coumadin, managed by clinic   Hyperlipidemia    Hypertension    Lumbar degenerative disc disease  Osteopenia    PAF (paroxysmal atrial fibrillation) [facility]) 2021   cardiologist-- dr Melburn Popper;  evaluted event monitor 03-26-2020 showed 1% burden  Afib   Presence of pessary    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   Seasonal allergies    Varicose vein    right leg   Vitamin D deficiency     Wears glasses     Past Surgical History:  Procedure Laterality Date   CARDIOVERSION N/A 08/12/2023   Procedure: CARDIOVERSION;  Surgeon: Sande Rives, MD;  Location: [facility] INVASIVE CV LAB;  Service: Cardiovascular;  Laterality: N/A;   CARDIOVERSION N/A 09/29/2023   Procedure: CARDIOVERSION;  Surgeon: Thomasene Ripple, DO;  Location: [facility] INVASIVE CV LAB;  Service: Cardiovascular;  Laterality: N/A;   COLPOCLEISIS N/A 06/20/2023   Procedure: LEFORTE COLPOCLEISIS; LEVATOR PLICATION WITH PERINEORRHAPHY;  Surgeon: Marguerita Beards, MD;  Location: [facility];  Service: Gynecology;  Laterality: N/A;   CYSTOSCOPY N/A 06/20/2023   Procedure: CYSTOSCOPY;  Surgeon: Marguerita Beards, MD;  Location: [facility];  Service: Gynecology;  Laterality: N/A;   DILATION AND CURETTAGE OF UTERUS  1998   FEMORAL HERNIA REPAIR Left 11/02/2006   @[facility]   by dr Grier Mitts;   Emergent Inguinal exploratory & primary repair femoral hernia and Reduction of small bowel obstruction   INGUINAL HERNIA REPAIR Left 02/03/2007   @[facility]  by dr Grier Mitts;   Open recurrent repair [facility] w/ mesh   INGUINAL HERNIA REPAIR N/A 11/04/2021   Procedure: lysis of adhesions reduction of incarcerated recurrent left femoral hernia laparoscopic repair. laparoscopic right femoral hernia repair and right obturator repair with mesh. tap block;  Surgeon: Karie Soda, MD;  Location: [facility];  Service: General;  Laterality: N/A;   ROBOTIC ASSISTED DIAGNOSTIC LAPAROSCOPY  09/18/2021   @[facility]  by dr Judie Petit. Daphine Deutscher;   ATTEMPTED  reduction recurrent inguinal hernia,  aborted due to involvement of bladder and possible vagina   TOTAL HIP ARTHROPLASTY Right 06/26/2014   Procedure: RIGHT TOTAL HIP ARTHROPLASTY ANTERIOR APPROACH WITH ACETABULAR AUTOGRAFT;  Surgeon: Loanne Drilling, MD;  Location: [facility];  Service: Orthopedics;  Laterality: Right;     Medications Prior to Admission: Prior to Admission medications   Medication Sig  Start Date End Date Taking? Authorizing Provider  acetaminophen (TYLENOL) 500 MG tablet Take 1,000 mg by mouth every 6 (six) hours as needed for mild pain (pain score 1-3) or moderate pain (pain score 4-6).    [provider]  amiodarone (PACERONE) 200 MG tablet Take 200 mg by mouth daily.    [provider]  atorvastatin (LIPITOR) 20 MG tablet Take 1 tablet by mouth once daily 12/09/22   Nahser, Deloris Ping, MD  Cholecalciferol (VITAMIN D3) 50 MCG (2000 UT) TABS Take 2,000 Units by mouth in the morning.    [provider]  diltiazem (CARDIZEM CD) 120 MG 24 hr capsule Take 1 capsule (120 mg total) by mouth in the morning and at bedtime. 07/19/23   Tereso Newcomer T, PA-C  estradiol (ESTRACE) 0.1 MG/GM vaginal cream PLACE 0.5 GRAMS VAGINALLY TWICE A WEEK 03/28/23   Selmer Dominion, NP  furosemide (LASIX) 40 MG tablet Take 1 tablet (40 mg total) by mouth daily. 08/10/23 11/08/23  Tereso Newcomer T, PA-C  guaiFENesin (ROBITUSSIN) 100 MG/5ML liquid Take 5 mLs by mouth every 4 (four) hours as needed for cough or to loosen phlegm. 06/24/23   Rai, Delene Ruffini, MD  metoprolol tartrate (LOPRESSOR)  100 MG tablet Take 1 tablet (100 mg total) by mouth 2 (two) times daily. 06/24/23   Rai, Delene Ruffini, MD  omeprazole (PRILOSEC OTC) 20 MG tablet Take 20 mg by mouth daily.    [provider]  potassium chloride (KLOR-CON) 10 MEQ tablet Take 1 tablet by mouth once daily 06/21/23   Nahser, Deloris Ping, MD  trospium (SANCTURA) 20 MG tablet Take 1 tablet (20 mg total) by mouth 2 (two) times daily. 01/13/23   Selmer Dominion, NP  warfarin (COUMADIN) 5 MG tablet TAKE 1 TO 1 & 1/2 (ONE & ONE-HALF) TABLETS BY MOUTH ONCE DAILY Patient taking differently: Take 2.5 mg by mouth daily. 08/31/23   Nahser, Deloris Ping, MD     Allergies:    Allergies  Allergen Reactions   Lisinopril Cough   Thiazide-Type Diuretics     hyponatremia    Social History:   Social History   Socioeconomic History   Marital  status: Married    Spouse name: Not on file   Number of children: Not on file   Years of education: Not on file   Highest education level: Not on file  Occupational History   Not on file  Tobacco Use   Smoking status: Never   Smokeless tobacco: Never  Vaping Use   Vaping status: Never Used  Substance and Sexual Activity   Alcohol use: No   Drug use: Never   Sexual activity: Not Currently    Birth control/protection: Post-menopausal  Other Topics Concern   Not on file  Social History Narrative   Not on file   Social Drivers of Health   Financial Resource Strain: Not on file  Food Insecurity: No Food Insecurity (06/20/2023)   Hunger Vital Sign    Worried About Running Out of Food in the Last Year: Never true    Ran Out of Food in the Last Year: Never true  Transportation Needs: No Transportation Needs (06/20/2023)   PRAPARE - Administrator, Civil Service (Medical): No    Lack of Transportation (Non-Medical): No  Physical Activity: Not on file  Stress: Not on file  Social Connections: Not on file  Intimate Partner Violence: Not At Risk (06/20/2023)   Humiliation, Afraid, Rape, and Kick questionnaire    Fear of Current or Ex-Partner: No    Emotionally Abused: No    Physically Abused: No    Sexually Abused: No    Family History:   The patient's family history includes Cancer in her mother; Stroke in her father.    ROS:  Please see the history of present illness.  All other ROS reviewed and negative.     Physical Exam/Data:   Vitals:   09/30/23 1303 09/30/23 1304 09/30/23 1631  BP: 130/87  136/85  Pulse: (!) 51  (!) 51  Resp: 20  20  Temp: 97.8 F (36.6 C)  98 F (36.7 C)  TempSrc:   Oral  SpO2: 96%  97%  Weight:  55.9 kg   Height:  4\' 11"  (1.499 m)    No intake or output data in the 24 hours ending 09/30/23 1718    09/30/2023    1:04 PM 09/29/2023    9:01 AM 09/27/2023    3:48 PM  Last 3 Weights  Weight (lbs) 123 lb 3.8 oz 123 lb 6.4 oz 123 lb  6.4 oz  Weight (kg) 55.9 kg 55.974 kg 55.974 kg     Body mass index is 24.89 kg/m.  Well-developed  well-nourished in no acute distress. Skin is warm and dry. HEENT-normal with normal eyelids. Neck is supple with normal carotid upstroke and no bruits. Chest is clear to auscultation with normal expansion. Cardiovascular reveals a bradycardic rate but irregular rhythm.  No murmurs are appreciated. Abdominal exam nontender send positive bowel sounds no Passman megaly no masses appreciated. Extremities show trace to 1+ edema right greater than left. 2+ femoral pulses bilaterally. Neurologic exam-no focal findings noted. Psych-normal affect.   EKG:  The ECG that was done today was personally reviewed and demonstrates junctional rhythm HR 51bpm nonspecific STTW changes  Relevant CV Studies: 2d echo 06/23/23   1. Left ventricular ejection fraction, by estimation, is 60 to 65%. The  left ventricle has normal function. The left ventricle has no regional  wall motion abnormalities. Left ventricular diastolic parameters were  normal.   2. Right ventricular systolic function is normal. The right ventricular  size is normal. There is moderately elevated pulmonary artery systolic  pressure. The estimated right ventricular systolic pressure is 45.0 mmHg.   3. Left atrial size was upper limit of normal.   4. Right atrial size was dilated.   5. The mitral valve is normal in structure. Moderate mitral valve  regurgitation. No evidence of mitral stenosis.   6. Tricuspid valve regurgitation is moderate to severe.   7. The aortic valve is normal in structure. Aortic valve regurgitation is  not visualized. No aortic stenosis is present.   8. The inferior vena cava is dilated in size with <50% respiratory  variability, suggesting right atrial pressure of 15 mmHg.   Comparison(s): A prior study was performed on 11/08/2021. Grade 2  diastolic dysfunction is now normal. Mitral / tricuspid valve   regurgitation and estimated RAP of are new findings compared to  prior study.   Laboratory Data:  High Sensitivity Troponin:   Recent Labs  Lab 09/30/23 1318  TROPONINIHS 9      Chemistry Recent Labs  Lab 09/30/23 1318  NA 128*  K 4.0  CL 92*  CO2 22  GLUCOSE 140*  BUN 22  CREATININE 1.26*  CALCIUM 9.4  GFRNONAA 44*  ANIONGAP 14     Hematology Recent Labs  Lab 09/30/23 1318  WBC 8.8  RBC 4.11  HGB 12.6  HCT 37.7  MCV 91.7  MCH 30.7  MCHC 33.4  RDW 13.6  PLT 271    BNP Recent Labs  Lab 09/30/23 1316  BNP 606.7*      Radiology/Studies:  DG Chest 2 View Result Date: 09/30/2023 CLINICAL DATA:  Short of breath, chest pressure, history of atrial fibrillation with cardioversion yesterday EXAM: CHEST - 2 VIEW COMPARISON:  06/23/2023 FINDINGS: Frontal and lateral views of the chest demonstrates stable enlargement of the cardiac silhouette. Minimal residual blunting of the right costophrenic angle may reflect pleural thickening or trace effusion. No airspace disease or pneumothorax. No acute bony abnormalities. IMPRESSION: 1. Trace residual right pleural effusion versus pleural thickening. 2. Stable enlarged cardiac silhouette. Electronically Signed   By: Sharlet Salina M.D.   On: 09/30/2023 14:59     Assessment and Plan:   1. Chest pressure, SOB, fatigue in setting of junctional rhythm following DCCV 09/29/23 for persistent atrial fibrillation 2. Hyponatremia, mild AKI, increased thirst, decreased appetite 3. Chronic HFpEF 4. Moderate MR, moderate-severe TR, moderate pHTN by echocardiogram 06/2023 5. H/o CVAx2, on Coumadin  See MD attestation regarding plan of care. Will consult pharm to manage Coumadin. Will also call them to ensure  Hannah Downs gets dose ASAP. Will need to follow hyponatremia in case this is completely unrelated to the acute presentation. Per d/w Dr. Jens Som preliminarily hold Lasix/KCl, oral rehydration with diet, hold off IVF. Will put on  team A with Dr .Jens Som per his request for tomorrow.  Risk Assessment/Risk Scores:         CHA2DS2-VASc Score = 7   This indicates a 11.2% annual risk of stroke. The patient's score is based upon: CHF History: 0 HTN History: 1 Diabetes History: 0 Stroke History: 2 Vascular Disease History: 1 Age Score: 2 Gender Score: 1     Code Status: Full Code  Severity of Illness: The appropriate patient status for this patient is OBSERVATION. Observation status is judged to be reasonable and necessary in order to provide the required intensity of service to ensure the patient's safety. The patient's presenting symptoms, physical exam findings, and initial radiographic and laboratory data in the context of their medical condition is felt to place them at decreased risk for further clinical deterioration. Furthermore, it is anticipated that the patient will be medically stable for discharge from the hospital within 2 midnights of admission.  If Hannah Downs stays overnight tomorrow night, please update to inpatient status in rounds tomorrow.  For questions or updates, please contact [facility] Please consult [email or web address] for contact info under     Signed, [name], PA-C  09/30/2023 5:18 PM  As above, patient seen and examined.  Briefly Hannah Downs is a 78 year old female with past medical history of paroxysmal atrial fibrillation, chronic diastolic congestive heart failure, moderate mitral regurgitation, hypertension, hyperlipidemia, prior CVA, gastroesophageal reflux disease status post recent cardioversion for evaluation of bradycardia/junctional rhythm.  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  Patient has had problems with symptomatic atrial fibrillation.  Hannah Downs has been treated with Cardizem, metoprolol and amiodarone was loaded.  Hannah Downs  underwent cardioversion yesterday.  Following the procedure Hannah Downs was noted to be in a junctional rhythm with a heart rate of 53.  Metoprolol was decreased to 50 mg twice daily, Cardizem was continued as well as amiodarone.  Upon arrival to home Hannah Downs was weak with significant dyspnea on exertion.  Hannah Downs also complained of chest pressure.  Hannah Downs had dizziness as well.  Hannah Downs was told to come to the emergency room.  Electrocardiogram shows junctional rhythm with no ST changes.  Laboratory showed sodium 128, creatinine 1.26, BNP 606, troponin 9, white blood cell count 8.8, hemoglobin 12.6, INR 2.4.  Chest x-ray shows cardiac enlargement and possible small right pleural effusion.  1 junctional bradycardia-patient presents with symptomatic junctional bradycardia (Hannah Downs complains of increased dyspnea on exertion, chest pressure and dizziness).  Will discontinue metoprolol and Cardizem.  Will continue amiodarone for now.  Follow on telemetry.  Hopefully her bradycardia will improve with above medication adjustments.  2 paroxysmal atrial fibrillation-status post cardioversion 09/30/23.  Will continue amiodarone for now.  Follow heart rate as outlined above.  Continue Coumadin with goal INR 2-3 particularly in light of cardioversion yesterday (INR will need to remain therapeutic).  3 hyponatremia-blood pressure is borderline and will hold Lasix and potassium today.  Resume tomorrow pending follow-up laboratories.  Recheck sodium tomorrow morning.  4 hypertension-we are discontinuing metoprolol and Cardizem.  Will need to follow blood pressure and likely add additional medications for control based on follow-up readings.  5 chest pressure-electrocardiogram shows no ST changes and initial troponin normal.  Will repeat.  Likely secondary to junctional bradycardia.  Do not anticipate additional ischemia evaluation.  6 chronic diastolic congestive heart failure-will resume Lasix tomorrow morning if blood pressure  improved.  Olga Millers, MD

## 2023-09-30 NOTE — Progress Notes (Signed)
PHARMACY - ANTICOAGULATION CONSULT NOTE  Pharmacy Consult for Warfarin Indication: atrial fibrillation  Allergies  Allergen Reactions   Lisinopril Cough   Thiazide-Type Diuretics     hyponatremia    Patient Measurements: Height: 4\' 11"  (149.9 cm) Weight: 55.9 kg (123 lb 3.8 oz) IBW/kg (Calculated) : 43.2  Vital Signs: Temp: 98 F (36.7 C) (01/17 1631) Temp Source: Oral (01/17 1631) BP: 136/85 (01/17 1631) Pulse Rate: 51 (01/17 1631)  Labs: Recent Labs    09/28/23 1345 09/29/23 0915 09/30/23 1318  HGB  --   --  12.6  HCT  --   --  37.7  PLT  --   --  271  LABPROT  --  24.7* 26.2*  INR 2.6 2.2* 2.4*  CREATININE  --   --  1.26*  TROPONINIHS  --   --  9    Estimated Creatinine Clearance: 28.5 mL/min (A) (by C-G formula based on SCr of 1.26 mg/dL (H)).   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   neurologist--- dr Pearlean Brownie;   11-06-2021  left frontoparietal cortical embolic infarct secondary AFib whilie off anticoagulant for emergent surgery then  post surgery 11-12-2022 on eliquis , acute right cerebellar PICA territory infarct ;  pt now on chronic coumadin, managed by clinic   Hyperlipidemia    Hypertension    Lumbar degenerative disc disease    Osteopenia    PAF (paroxysmal atrial fibrillation) ([facility]) 2021   cardiologist-- dr Melburn Popper;  evaluted event monitor 03-26-2020 showed 1% burden  Afib   Presence of pessary    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   Seasonal allergies    Varicose vein    right leg   Vitamin D deficiency    Wears glasses    Medications:  Scheduled:  Infusions:   Assessment: 77YOF with h/o atrial fibrillation on warfarin who presents with SOB and chest pressure s/p  cardioversion 1/16. PTA warfarin dose 2.5mg  daily. Confirmed with patient that she has not taken warfarin today. Pharmacy has been consulted to dose warfarin.  INR 2.4 (therapeutic). Hgb 12.6, plt 271.  Goal of Therapy:  INR 2-3 Monitor platelets by anticoagulation protocol: Yes   Plan:  Warfarin 2.5mg  x1 Monitor CBC and s/sx of bleeding F/u daily INR and consider reducing frequency of checks if remains stable  [employer] 09/30/2023,5:41 PM

## 2023-09-30 NOTE — ED Notes (Signed)
RN assisted pt into comfortable position and gave pillow for comfort and at pt request. Pt given warm blankets, lighting adjusted. Pt in nad

## 2023-09-30 NOTE — ED Provider Triage Note (Signed)
Emergency Medicine Provider Triage Evaluation Note  CYNDRA GUNDY , a 78 y.o. female  was evaluated in triage.  Pt complains of exertional chest pain, exertional shortness of breath.  No current chest pain.  Chest pain and shortness of breath are immediately relieved upon resting.  Has history of A-fib on warfarin, valvular disease and congestive heart failure.  Notes some swelling in feet and ankles but not significantly worse than normal.  Called cardiologist office who was sent here.  Had cardioversion yesterday for A-fib  Review of Systems  Positive: Cp, SOB, generalized weakness Negative: Abdominal pain   Physical Exam  BP 130/87 (BP Location: Right Arm)   Pulse (!) 51   Temp 97.8 F (36.6 C)   Resp 20   Ht 4\' 11"  (1.499 m)   Wt 55.9 kg   SpO2 96%   BMI 24.89 kg/m  Gen:   Awake, no distress   Resp:  Normal effort  MSK:   Moves extremities without difficulty  Other:  No neuro deficit   Medical Decision Making  Medically screening exam initiated at 1:47 PM.  Appropriate orders placed.  Verbie E Tomich was informed that the remainder of the evaluation will be completed by another provider, this initial triage assessment does not replace that evaluation, and the importance of remaining in the ED until their evaluation is complete.     Smitty Knudsen, PA-C 09/30/23 1348

## 2023-09-30 NOTE — ED Triage Notes (Addendum)
Pt presents POV for SOB and chest pressure started yesterday after coming home. Pt states she was cardioverted yesterday for afib. A&O x 4. Pt also complains of dry mouth, thirst. No hx of diabetes

## 2023-09-30 NOTE — ED Notes (Signed)
RN assisted patient to restroom for evacuation

## 2023-09-30 NOTE — Telephone Encounter (Signed)
Spoke with the patient who states that she is having shortness of breath and chest pressure when she moves around. She states that she also is very thirsty and does not have an appetite. She had a cardioversion yesterday and had called in with concerns of low heart rate and blood pressure. She was advised to hold Cardizem and reduce metoprolol and if she became symptomatic to report to the ED. I advised the patient that due to now being symptomatic she needs to go to the ED for evaluation. She is not sure if she will be able to go today due to her family having other appointments.

## 2023-10-01 ENCOUNTER — Other Ambulatory Visit: Payer: Self-pay

## 2023-10-01 DIAGNOSIS — I081 Rheumatic disorders of both mitral and tricuspid valves: Secondary | ICD-10-CM | POA: Diagnosis present

## 2023-10-01 DIAGNOSIS — M1990 Unspecified osteoarthritis, unspecified site: Secondary | ICD-10-CM | POA: Diagnosis present

## 2023-10-01 DIAGNOSIS — Z823 Family history of stroke: Secondary | ICD-10-CM | POA: Diagnosis not present

## 2023-10-01 DIAGNOSIS — E871 Hypo-osmolality and hyponatremia: Secondary | ICD-10-CM | POA: Diagnosis present

## 2023-10-01 DIAGNOSIS — Z888 Allergy status to other drugs, medicaments and biological substances status: Secondary | ICD-10-CM | POA: Diagnosis not present

## 2023-10-01 DIAGNOSIS — N179 Acute kidney failure, unspecified: Secondary | ICD-10-CM | POA: Diagnosis present

## 2023-10-01 DIAGNOSIS — M8580 Other specified disorders of bone density and structure, unspecified site: Secondary | ICD-10-CM | POA: Diagnosis present

## 2023-10-01 DIAGNOSIS — Z7901 Long term (current) use of anticoagulants: Secondary | ICD-10-CM | POA: Diagnosis not present

## 2023-10-01 DIAGNOSIS — Z96641 Presence of right artificial hip joint: Secondary | ICD-10-CM | POA: Diagnosis present

## 2023-10-01 DIAGNOSIS — D6869 Other thrombophilia: Secondary | ICD-10-CM | POA: Diagnosis present

## 2023-10-01 DIAGNOSIS — R001 Bradycardia, unspecified: Secondary | ICD-10-CM | POA: Diagnosis present

## 2023-10-01 DIAGNOSIS — I4819 Other persistent atrial fibrillation: Secondary | ICD-10-CM | POA: Diagnosis present

## 2023-10-01 DIAGNOSIS — I480 Paroxysmal atrial fibrillation: Secondary | ICD-10-CM | POA: Diagnosis not present

## 2023-10-01 DIAGNOSIS — I700 Atherosclerosis of aorta: Secondary | ICD-10-CM | POA: Diagnosis present

## 2023-10-01 DIAGNOSIS — I110 Hypertensive heart disease with heart failure: Secondary | ICD-10-CM | POA: Diagnosis present

## 2023-10-01 DIAGNOSIS — K219 Gastro-esophageal reflux disease without esophagitis: Secondary | ICD-10-CM | POA: Diagnosis present

## 2023-10-01 DIAGNOSIS — I272 Pulmonary hypertension, unspecified: Secondary | ICD-10-CM | POA: Diagnosis present

## 2023-10-01 DIAGNOSIS — E876 Hypokalemia: Secondary | ICD-10-CM | POA: Diagnosis not present

## 2023-10-01 DIAGNOSIS — I5032 Chronic diastolic (congestive) heart failure: Secondary | ICD-10-CM | POA: Diagnosis present

## 2023-10-01 DIAGNOSIS — Z8673 Personal history of transient ischemic attack (TIA), and cerebral infarction without residual deficits: Secondary | ICD-10-CM | POA: Diagnosis not present

## 2023-10-01 DIAGNOSIS — I4891 Unspecified atrial fibrillation: Secondary | ICD-10-CM | POA: Diagnosis not present

## 2023-10-01 DIAGNOSIS — E785 Hyperlipidemia, unspecified: Secondary | ICD-10-CM | POA: Diagnosis present

## 2023-10-01 DIAGNOSIS — I999 Unspecified disorder of circulatory system: Secondary | ICD-10-CM | POA: Diagnosis present

## 2023-10-01 DIAGNOSIS — Z79899 Other long term (current) drug therapy: Secondary | ICD-10-CM | POA: Diagnosis not present

## 2023-10-01 DIAGNOSIS — I495 Sick sinus syndrome: Secondary | ICD-10-CM | POA: Diagnosis present

## 2023-10-01 DIAGNOSIS — I10 Essential (primary) hypertension: Secondary | ICD-10-CM | POA: Diagnosis not present

## 2023-10-01 LAB — COMPREHENSIVE METABOLIC PANEL
ALT: 33 U/L (ref 0–44)
AST: 43 U/L — ABNORMAL HIGH (ref 15–41)
Albumin: 3.4 g/dL — ABNORMAL LOW (ref 3.5–5.0)
Alkaline Phosphatase: 66 U/L (ref 38–126)
Anion gap: 9 (ref 5–15)
BUN: 16 mg/dL (ref 8–23)
CO2: 23 mmol/L (ref 22–32)
Calcium: 9.1 mg/dL (ref 8.9–10.3)
Chloride: 100 mmol/L (ref 98–111)
Creatinine, Ser: 0.9 mg/dL (ref 0.44–1.00)
GFR, Estimated: >60 mL/min (ref >60)
Glucose, Bld: 103 mg/dL — ABNORMAL HIGH (ref 70–99)
Potassium: 3.7 mmol/L (ref 3.5–5.1)
Sodium: 132 mmol/L — ABNORMAL LOW (ref 135–145)
Total Bilirubin: 1.8 mg/dL — ABNORMAL HIGH (ref 0.0–1.2)
Total Protein: 6.2 g/dL — ABNORMAL LOW (ref 6.5–8.1)

## 2023-10-01 LAB — CBC
HCT: 35.6 % — ABNORMAL LOW (ref 36.0–46.0)
Hemoglobin: 12.1 g/dL (ref 12.0–15.0)
MCH: 30.4 pg (ref 26.0–34.0)
MCHC: 34 g/dL (ref 30.0–36.0)
MCV: 89.4 fL (ref 80.0–100.0)
Platelets: 237 10*3/uL (ref 150–400)
RBC: 3.98 MIL/uL (ref 3.87–5.11)
RDW: 13.5 % (ref 11.5–15.5)
WBC: 8.1 10*3/uL (ref 4.0–10.5)
nRBC: 0 % (ref 0.0–0.2)

## 2023-10-01 LAB — PROTIME-INR
INR: 2.5 — ABNORMAL HIGH (ref 0.8–1.2)
Prothrombin Time: 26.9 s — ABNORMAL HIGH (ref 11.4–15.2)

## 2023-10-01 MED ORDER — SODIUM CHLORIDE 0.9% FLUSH: 3.0000 mL | INTRAVENOUS | Status: DC | PRN

## 2023-10-01 MED ORDER — VITAMIN D 25 MCG (1000 UNIT) PO TABS
2000.0000 [IU] | ORAL_TABLET | Freq: Every morning | ORAL | Status: DC
  Administered 2023-10-01 – 2023-10-05 (×5): 2000 [IU] via ORAL
  Filled 2023-10-01 (×5): qty 2

## 2023-10-01 MED ORDER — WARFARIN SODIUM 2.5 MG PO TABS
2.5000 mg | ORAL_TABLET | Freq: Once | ORAL | Status: AC
  Administered 2023-10-01: 2.5 mg via ORAL
  Filled 2023-10-01: qty 1

## 2023-10-01 MED ORDER — ACETAMINOPHEN 500 MG PO TABS
1000.0000 mg | ORAL_TABLET | Freq: Four times a day (QID) | ORAL | Status: DC | PRN
Start: 2023-10-01 — End: 2023-10-05

## 2023-10-01 MED ORDER — ATORVASTATIN CALCIUM 10 MG PO TABS
20.0000 mg | ORAL_TABLET | Freq: Every day | ORAL | Status: DC
  Administered 2023-10-01 – 2023-10-05 (×5): 20 mg via ORAL
  Filled 2023-10-01 (×6): qty 2

## 2023-10-01 MED ORDER — FUROSEMIDE 20 MG PO TABS
20.0000 mg | ORAL_TABLET | Freq: Every day | ORAL | Status: DC
  Administered 2023-10-01 – 2023-10-05 (×5): 20 mg via ORAL
  Filled 2023-10-01 (×5): qty 1

## 2023-10-01 MED ORDER — PANTOPRAZOLE SODIUM 40 MG PO TBEC
40.0000 mg | DELAYED_RELEASE_TABLET | Freq: Every day | ORAL | Status: DC
  Administered 2023-10-01 – 2023-10-05 (×5): 40 mg via ORAL
  Filled 2023-10-01 (×5): qty 1

## 2023-10-01 MED ORDER — SODIUM CHLORIDE 0.9% FLUSH
3.0000 mL | Freq: Two times a day (BID) | INTRAVENOUS | Status: DC
  Administered 2023-10-01 – 2023-10-05 (×10): 3 mL via INTRAVENOUS

## 2023-10-01 MED ORDER — SODIUM CHLORIDE 0.9 % IV SOLN: 250.0000 mL | INTRAVENOUS | Status: AC | PRN

## 2023-10-01 MED ORDER — AMIODARONE HCL 200 MG PO TABS
200.0000 mg | ORAL_TABLET | Freq: Every day | ORAL | Status: DC
  Administered 2023-10-01 – 2023-10-02 (×2): 200 mg via ORAL
  Filled 2023-10-01 (×2): qty 1

## 2023-10-01 NOTE — Progress Notes (Signed)
PHARMACY - ANTICOAGULATION CONSULT NOTE  Pharmacy Consult for Warfarin Indication: atrial fibrillation  Allergies  Allergen Reactions   Thiazide-Type Diuretics Other (See Comments)    Hyponatremia    Zestril [Lisinopril] Cough    Patient Measurements: Height: 4\' 11"  (149.9 cm) Weight: 59.4 kg (131 lb) IBW/kg (Calculated) : 43.2  Vital Signs: Temp: 98.2 F (36.8 C) (01/18 0802) Temp Source: Oral (01/18 0802) BP: 127/76 (01/18 0802) Pulse Rate: 57 (01/18 0406)  Labs: Recent Labs    09/29/23 0915 09/30/23 1318 09/30/23 1719 10/01/23 0233  HGB  --  12.6  --  12.1  HCT  --  37.7  --  35.6*  PLT  --  271  --  237  LABPROT 24.7* 26.2*  --  26.9*  INR 2.2* 2.4*  --  2.5*  CREATININE  --  1.26*  --  0.90  TROPONINIHS  --  9 9  --     Estimated Creatinine Clearance: 41.1 mL/min (by C-G formula based on SCr of 0.9 mg/dL).   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   neurologist--- dr Pearlean Brownie;   11-06-2021  left frontoparietal cortical embolic infarct secondary AFib whilie off anticoagulant for emergent surgery then  post surgery 11-12-2022 on eliquis , acute right cerebellar PICA territory infarct ;  pt now on chronic coumadin, managed by clinic   Hyperlipidemia    Hypertension    Lumbar degenerative disc disease    Osteopenia    PAF (paroxysmal atrial fibrillation) (HCC) 2021   cardiologist-- dr Melburn Popper;  evaluted event monitor 03-26-2020 showed 1% burden  Afib   Presence of pessary    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 PTA warfarin dose 2.5mg  daily. Confirmed with patient that she has not taken warfarin today. Pharmacy has been consulted to dose warfarin.  INR 2.5 (therapeutic) following PTA dose 2.5 mg warfarin yesterday. Hgb 12.1, plt 237 stable. Eating 75% of meals. No signs/symptoms of bleeding noted.  Goal of Therapy:  INR 2-3 Monitor platelets by anticoagulation protocol: Yes   Plan:  Warfarin 2.5 mg x1 Monitor CBC and s/sx of bleeding Monitor for DDIs and changes in appetite F/u daily INR and consider reducing frequency of checks if remains stable  Stephenie Acres, PharmD PGY1 Pharmacy Resident 10/01/2023 8:20 AM

## 2023-10-01 NOTE — Progress Notes (Signed)
Rounding Note    Patient Name: Hannah Downs Date of Encounter: 10/01/2023  [facility] Cardiologist: Christell Constant, MD   Subjective   No CP or dyspnea  Inpatient Medications    Scheduled Meds:  amiodarone  200 mg Oral Daily   atorvastatin  20 mg Oral Daily   cholecalciferol  2,000 Units Oral q AM   pantoprazole  40 mg Oral Daily   sodium chloride flush  3 mL Intravenous Q12H   trospium  20 mg Oral BID   warfarin  2.5 mg Oral ONCE-1600   Warfarin - Pharmacist Dosing Inpatient   Does not apply q1600   Continuous Infusions:  sodium chloride     PRN Meds: sodium chloride, acetaminophen, sodium chloride flush   Vital Signs    Vitals:   09/30/23 2200 10/01/23 0002 10/01/23 0406 10/01/23 0802  BP: 122/77 132/84 123/80 127/76  Pulse: (!) 48 (!) 59 (!) 57   Resp: (!) 21 20 18 18   Temp:  97.9 F (36.6 C) 98.3 F (36.8 C) 98.2 F (36.8 C)  TempSrc:  Oral Oral Oral  SpO2: 98% 98% 95%   Weight:  59.4 kg    Height:  4\' 11"  (1.499 m)      Intake/Output Summary (Last 24 hours) at 10/01/2023 0913 Last data filed at 10/01/2023 0804 Gross per 24 hour  Intake 240 ml  Output 600 ml  Net -360 ml      10/01/2023   12:02 AM 09/30/2023    1:04 PM 09/29/2023    9:01 AM  Last 3 Weights  Weight (lbs) 131 lb 123 lb 3.8 oz 123 lb 6.4 oz  Weight (kg) 59.421 kg 55.9 kg 55.974 kg      Telemetry    Junctional rhythm - Personally Reviewed   Physical Exam   GEN: No acute distress.   Neck: No JVD Cardiac: RRR Respiratory: Clear to auscultation bilaterally. GI: Soft, nontender, non-distended  MS: No edema Neuro:  Nonfocal  Psych: Normal affect   Labs    High Sensitivity Troponin:   Recent Labs  Lab 09/30/23 1318 09/30/23 1719  TROPONINIHS 9 9     Chemistry Recent Labs  Lab 09/30/23 1318 10/01/23 0233  NA 128* 132*  K 4.0 3.7  CL 92* 100  CO2 22 23  GLUCOSE 140* 103*  BUN 22 16  CREATININE 1.26* 0.90  CALCIUM 9.4 9.1  PROT  --   6.2*  ALBUMIN  --  3.4*  AST  --  43*  ALT  --  33  ALKPHOS  --  66  BILITOT  --  1.8*  GFRNONAA 44* >60  ANIONGAP 14 9    Hematology Recent Labs  Lab 09/30/23 1318 10/01/23 0233  WBC 8.8 8.1  RBC 4.11 3.98  HGB 12.6 12.1  HCT 37.7 35.6*  MCV 91.7 89.4  MCH 30.7 30.4  MCHC 33.4 34.0  RDW 13.6 13.5  PLT 271 237   Thyroid  Recent Labs  Lab 09/30/23 1719  TSH 4.856*    BNP Recent Labs  Lab 09/30/23 1316  BNP 606.7*     Radiology    DG Chest 2 View Result Date: 09/30/2023 CLINICAL DATA:  Short of breath, chest pressure, history of atrial fibrillation with cardioversion yesterday EXAM: CHEST - 2 VIEW COMPARISON:  06/23/2023 FINDINGS: Frontal and lateral views of the chest demonstrates stable enlargement of the cardiac silhouette. Minimal residual blunting of the right costophrenic angle may reflect pleural thickening or trace  effusion. No airspace disease or pneumothorax. No acute bony abnormalities. IMPRESSION: 1. Trace residual right pleural effusion versus pleural thickening. 2. Stable enlarged cardiac silhouette. Electronically Signed   By: Sharlet Salina M.D.   On: 09/30/2023 14:59   EP STUDY Result Date: 09/30/2023 See surgical note for result.   Patient Profile     78 year old female with past medical history of paroxysmal atrial fibrillation, chronic diastolic congestive heart failure, moderate mitral regurgitation, hypertension, hyperlipidemia, prior CVA, gastroesophageal reflux disease status post recent cardioversion for evaluation of bradycardia/junctional rhythm. 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 Patient has had problems with symptomatic atrial fibrillation. She has been treated with Cardizem, metoprolol and amiodarone was loaded. She underwent cardioversion 09/29/23. Following the procedure she was  noted to be in a junctional rhythm with a heart rate of 53. Metoprolol was decreased to 50 mg twice daily, Cardizem was continued as well as amiodarone. Upon arrival to home she was weak with significant dyspnea on exertion. She also complained of chest pressure. She had dizziness as well. She was told to come to the emergency room. Electrocardiogram shows junctional rhythm with no ST changes.   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  However her symptoms have improved.  Metoprolol and Cardizem were discontinued.  Will repeat ECG.  Continue amiodarone for now.  Continue to follow on telemetry.  Hopefully this will resolve with holding her Cardizem and metoprolol.     2 paroxysmal atrial fibrillation-status post cardioversion 09/30/23.  Will continue amiodarone for now.  Follow heart rate as outlined above.  Continue Coumadin with goal INR 2-3 particularly in light of cardioversion yesterday (INR will need to remain therapeutic).   3 hyponatremia-sodium improved this morning.  Will resume Lasix 20 mg daily.   4 hypertension-Continue to follow blood pressure after discontinuing Cardizem and metoprolol.  If blood pressure increases will add medications without AV nodal blocking effects.   5 chest pressure-electrocardiogram showed no ST changes and troponins normal.  Likely secondary to junctional bradycardia.  Do not anticipate additional ischemia evaluation.   6 chronic diastolic congestive heart failure-resume Lasix 20 mg daily and follow potassium and renal function.  For questions or updates, please contact [facility] Please consult [email or web address] for contact info under        Signed, Olga Millers, MD  10/01/2023, 9:13 AM

## 2023-10-01 NOTE — Plan of Care (Signed)

## 2023-10-01 NOTE — Care Management Obs Status (Signed)
MEDICARE OBSERVATION STATUS NOTIFICATION   Patient Details  Name: Hannah Downs MRN: [id number] Date of Birth: Jan 24, 1946   Medicare Observation Status Notification Given:  Yes    Lawerance Sabal, RN 10/01/2023, 2:59 PM

## 2023-10-01 NOTE — Plan of Care (Signed)

## 2023-10-02 DIAGNOSIS — I480 Paroxysmal atrial fibrillation: Secondary | ICD-10-CM

## 2023-10-02 LAB — CBC
HCT: 36.6 % (ref 36.0–46.0)
Hemoglobin: 12.4 g/dL (ref 12.0–15.0)
MCH: 30.4 pg (ref 26.0–34.0)
MCHC: 33.9 g/dL (ref 30.0–36.0)
MCV: 89.7 fL (ref 80.0–100.0)
Platelets: 229 10*3/uL (ref 150–400)
RBC: 4.08 MIL/uL (ref 3.87–5.11)
RDW: 13.9 % (ref 11.5–15.5)
WBC: 6.6 10*3/uL (ref 4.0–10.5)
nRBC: 0 % (ref 0.0–0.2)

## 2023-10-02 LAB — PROTIME-INR
INR: 2.7 — ABNORMAL HIGH (ref 0.8–1.2)
Prothrombin Time: 29.3 s — ABNORMAL HIGH (ref 11.4–15.2)

## 2023-10-02 LAB — BASIC METABOLIC PANEL
Anion gap: 6 (ref 5–15)
BUN: 7 mg/dL — ABNORMAL LOW (ref 8–23)
CO2: 27 mmol/L (ref 22–32)
Calcium: 8.8 mg/dL — ABNORMAL LOW (ref 8.9–10.3)
Chloride: 103 mmol/L (ref 98–111)
Creatinine, Ser: 0.68 mg/dL (ref 0.44–1.00)
GFR, Estimated: >60 mL/min (ref >60)
Glucose, Bld: 98 mg/dL (ref 70–99)
Potassium: 3.1 mmol/L — ABNORMAL LOW (ref 3.5–5.1)
Sodium: 136 mmol/L (ref 135–145)

## 2023-10-02 MED ORDER — METOPROLOL TARTRATE 25 MG PO TABS
25.0000 mg | ORAL_TABLET | Freq: Two times a day (BID) | ORAL | Status: DC
  Administered 2023-10-02 (×2): 25 mg via ORAL
  Filled 2023-10-02 (×2): qty 1

## 2023-10-02 MED ORDER — AMIODARONE HCL 200 MG PO TABS
200.0000 mg | ORAL_TABLET | Freq: Two times a day (BID) | ORAL | Status: DC
  Administered 2023-10-02 – 2023-10-03 (×2): 200 mg via ORAL
  Filled 2023-10-02 (×2): qty 1

## 2023-10-02 MED ORDER — WARFARIN SODIUM 1 MG PO TABS
1.5000 mg | ORAL_TABLET | Freq: Once | ORAL | Status: AC
  Administered 2023-10-02: 1.5 mg via ORAL
  Filled 2023-10-02: qty 1

## 2023-10-02 MED ORDER — WARFARIN SODIUM 2.5 MG PO TABS: 2.5000 mg | ORAL_TABLET | Freq: Once | ORAL | Status: DC

## 2023-10-02 MED ORDER — POTASSIUM CHLORIDE CRYS ER 20 MEQ PO TBCR
40.0000 meq | EXTENDED_RELEASE_TABLET | Freq: Once | ORAL | Status: AC
  Administered 2023-10-02: 40 meq via ORAL
  Filled 2023-10-02: qty 2

## 2023-10-02 NOTE — Progress Notes (Incomplete)
PHARMACY - ANTICOAGULATION CONSULT NOTE  Pharmacy Consult for Warfarin Indication: atrial fibrillation  Allergies  Allergen Reactions   Thiazide-Type Diuretics Other (See Comments)    Hyponatremia    Zestril [Lisinopril] Cough    Patient Measurements: Height: 4\' 11"  (149.9 cm) Weight: 55.3 kg (122 lb) IBW/kg (Calculated) : 43.2  Vital Signs: Temp: 98.5 F (36.9 C) (01/19 0400) Temp Source: Oral (01/19 0400) BP: 114/81 (01/19 0400) Pulse Rate: 118 (01/19 0400)  Labs: Recent Labs    09/30/23 1318 09/30/23 1719 10/01/23 0233 10/02/23 0246  HGB 12.6  --  12.1 12.4  HCT 37.7  --  35.6* 36.6  PLT 271  --  237 229  LABPROT 26.2*  --  26.9* 29.3*  INR 2.4*  --  2.5* 2.7*  CREATININE 1.26*  --  0.90 0.68  TROPONINIHS 9 9  --   --     Estimated Creatinine Clearance: 44.6 mL/min (by C-G formula based on SCr of 0.68 mg/dL).   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   neurologist--- dr Pearlean Brownie;   11-06-2021  left frontoparietal cortical embolic infarct secondary AFib whilie off anticoagulant for emergent surgery then  post surgery 11-12-2022 on eliquis , acute right cerebellar PICA territory infarct ;  pt now on chronic coumadin, managed by clinic   Hyperlipidemia    Hypertension    Lumbar degenerative disc disease    Osteopenia    PAF (paroxysmal atrial fibrillation) (HCC) 2021   cardiologist-- dr Melburn Popper;  evaluted event monitor 03-26-2020 showed 1% burden  Afib   Presence of pessary    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 PTA warfarin dose 2.5mg  daily. Confirmed with patient that she has not taken warfarin today. Pharmacy has been consulted to dose warfarin.  INR 2.5 (therapeutic) following PTA dose 2.5 mg warfarin yesterday. Hgb 12.1, plt 237 stable. Eating 75% of meals. No signs/symptoms of bleeding noted.  Goal of Therapy:  INR 2-3 Monitor platelets by anticoagulation protocol: Yes   Plan:  Warfarin 2.5 mg x1 Monitor CBC and s/sx of bleeding Monitor for DDIs and changes in appetite F/u daily INR and consider reducing frequency of checks if remains stable  Stephenie Acres, PharmD PGY1 Pharmacy Resident 10/02/2023 7:26 AM

## 2023-10-02 NOTE — Progress Notes (Addendum)
Rounding Note    Patient Name: Hannah Downs Date of Encounter: 10/02/2023  [facility] Cardiologist: Christell Constant, MD   Subjective   No CP or dyspnea  Inpatient Medications    Scheduled Meds:  amiodarone  200 mg Oral Daily   atorvastatin  20 mg Oral Daily   cholecalciferol  2,000 Units Oral q AM   furosemide  20 mg Oral Daily   pantoprazole  40 mg Oral Daily   sodium chloride flush  3 mL Intravenous Q12H   trospium  20 mg Oral BID   Warfarin - Pharmacist Dosing Inpatient   Does not apply q1600   Continuous Infusions:   PRN Meds: acetaminophen, sodium chloride flush   Vital Signs    Vitals:   10/01/23 2330 10/02/23 0400 10/02/23 0428 10/02/23 0804  BP: 123/85 114/81  (!) 108/92  Pulse: (!) 108 (!) 118  (!) 55  Resp: 17 18  18   Temp: 99.1 F (37.3 C) 98.5 F (36.9 C)  98 F (36.7 C)  TempSrc: Oral Oral  Oral  SpO2: 94% 93%  91%  Weight:   55.3 kg   Height:        Intake/Output Summary (Last 24 hours) at 10/02/2023 0915 Last data filed at 10/01/2023 2331 Gross per 24 hour  Intake 237 ml  Output 700 ml  Net -463 ml      10/02/2023    4:28 AM 10/01/2023   12:02 AM 09/30/2023    1:04 PM  Last 3 Weights  Weight (lbs) 122 lb 131 lb 123 lb 3.8 oz  Weight (kg) 55.339 kg 59.421 kg 55.9 kg      Telemetry    Atrial fibrillation with RVR - Personally Reviewed   Physical Exam   GEN: NAD Neck: Supple Cardiac: irregular and tachycardic Respiratory: CTA GI: Soft, NT/ND MS: No edema Neuro:  grossly intact Psych: Normal affect   Labs    High Sensitivity Troponin:   Recent Labs  Lab 09/30/23 1318 09/30/23 1719  TROPONINIHS 9 9     Chemistry Recent Labs  Lab 09/30/23 1318 10/01/23 0233 10/02/23 0246  NA 128* 132* 136  K 4.0 3.7 3.1*  CL 92* 100 103  CO2 22 23 27   GLUCOSE 140* 103* 98  BUN 22 16 7*  CREATININE 1.26* 0.90 0.68  CALCIUM 9.4 9.1 8.8*  PROT  --  6.2*  --   ALBUMIN  --  3.4*  --   AST  --  43*  --    ALT  --  33  --   ALKPHOS  --  66  --   BILITOT  --  1.8*  --   GFRNONAA 44* >60 >60  ANIONGAP 14 9 6     Hematology Recent Labs  Lab 09/30/23 1318 10/01/23 0233 10/02/23 0246  WBC 8.8 8.1 6.6  RBC 4.11 3.98 4.08  HGB 12.6 12.1 12.4  HCT 37.7 35.6* 36.6  MCV 91.7 89.4 89.7  MCH 30.7 30.4 30.4  MCHC 33.4 34.0 33.9  RDW 13.6 13.5 13.9  PLT 271 237 229   Thyroid  Recent Labs  Lab 09/30/23 1719  TSH 4.856*    BNP Recent Labs  Lab 09/30/23 1316  BNP 606.7*     Radiology    DG Chest 2 View Result Date: 09/30/2023 CLINICAL DATA:  Short of breath, chest pressure, history of atrial fibrillation with cardioversion yesterday EXAM: CHEST - 2 VIEW COMPARISON:  06/23/2023 FINDINGS: Frontal and lateral views of  the chest demonstrates stable enlargement of the cardiac silhouette. Minimal residual blunting of the right costophrenic angle may reflect pleural thickening or trace effusion. No airspace disease or pneumothorax. No acute bony abnormalities. IMPRESSION: 1. Trace residual right pleural effusion versus pleural thickening. 2. Stable enlarged cardiac silhouette. Electronically Signed   By: Sharlet Salina M.D.   On: 09/30/2023 14:59    Patient Profile     78 year old female with past medical history of paroxysmal atrial fibrillation, chronic diastolic congestive heart failure, moderate mitral regurgitation, hypertension, hyperlipidemia, prior CVA, gastroesophageal reflux disease status post recent cardioversion for evaluation of bradycardia/junctional rhythm. 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 Patient has had problems with symptomatic atrial fibrillation. She has been treated with Cardizem, metoprolol and amiodarone was loaded. She underwent cardioversion 09/29/23. Following the procedure she was noted to be in a junctional  rhythm with a heart rate of 53. Metoprolol was decreased to 50 mg twice daily, Cardizem was continued as well as amiodarone. Upon arrival to home she was weak with significant dyspnea on exertion. She also complained of chest pressure. She had dizziness as well. She was told to come to the emergency room. Electrocardiogram shows junctional rhythm with no ST changes.   Assessment & Plan      1 paroxysmal atrial fibrillation-patient underwent cardioversion September 30, 2023.  She developed junctional rhythm that was symptomatic.  Her amiodarone was continued but her Cardizem and metoprolol were discontinued.  She has now developed recurrent atrial fibrillation with rapid ventricular response.  She had approximately 6 weeks of amiodarone prior to her cardioversion.  I discussed rate control versus rhythm control today.  She would like to avoid repeat cardioversion but would potentially consider in the future.  I would favor continuing amiodarone (increase to 200 mg BID for 2 weeks then resume 200 mg daily).  Repeat attempt at cardioversion could be pursued once amiodarone is further loaded.  Will add metoprolol 25 mg twice daily for improved rate control. Follow telemetry closely for bradycardia given junctional bradycardia following recent DCCV. Once her heart rate is controlled she will be discharged and also follow-up with electrophysiology for discussion concerning ablation.  Continue Coumadin with goal INR greater than or equal to 2.     3 hypokalemia-supplement   4 hypertension-BP controlled; continue to follow.   5 chest pressure-electrocardiogram showed no ST changes and troponins normal.  Likely secondary to junctional bradycardia.  Do not anticipate additional ischemia evaluation.   6 chronic diastolic congestive heart failure-continue lasix.  For questions or updates, please contact  [facility] Please consult [email or web address] for contact info under        Signed, Olga Millers, MD   10/02/2023, 9:15 AM

## 2023-10-02 NOTE — Plan of Care (Signed)

## 2023-10-02 NOTE — Progress Notes (Signed)
PHARMACY - ANTICOAGULATION CONSULT NOTE  Pharmacy Consult for Warfarin Indication: atrial fibrillation  Allergies  Allergen Reactions   Thiazide-Type Diuretics Other (See Comments)    Hyponatremia    Zestril [Lisinopril] Cough    Patient Measurements: Height: 4\' 11"  (149.9 cm) Weight: 55.3 kg (122 lb) IBW/kg (Calculated) : 43.2  Vital Signs: Temp: 98 F (36.7 C) (01/19 0804) Temp Source: Oral (01/19 0804) BP: 128/99 (01/19 1008) Pulse Rate: 119 (01/19 1008)  Labs: Recent Labs    09/30/23 1318 09/30/23 1719 10/01/23 0233 10/02/23 0246  HGB 12.6  --  12.1 12.4  HCT 37.7  --  35.6* 36.6  PLT 271  --  237 229  LABPROT 26.2*  --  26.9* 29.3*  INR 2.4*  --  2.5* 2.7*  CREATININE 1.26*  --  0.90 0.68  TROPONINIHS 9 9  --   --     Estimated Creatinine Clearance: 44.6 mL/min (by C-G formula based on SCr of 0.68 mg/dL).   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   neurologist--- dr Pearlean Brownie;   11-06-2021  left frontoparietal cortical embolic infarct secondary AFib whilie off anticoagulant for emergent surgery then  post surgery 11-12-2022 on eliquis , acute right cerebellar PICA territory infarct ;  pt now on chronic coumadin, managed by clinic   Hyperlipidemia    Hypertension    Lumbar degenerative disc disease    Osteopenia    PAF (paroxysmal atrial fibrillation) (HCC) 2021   cardiologist-- dr Melburn Popper;  evaluted event monitor 03-26-2020 showed 1% burden  Afib   Presence of pessary    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 PTA warfarin dose 2.5mg  daily. Confirmed with patient that she has not taken warfarin today. Pharmacy has been consulted to dose warfarin.  INR 2.7 (therapeutic) following PTA dose 2.5 mg warfarin yesterday. Hgb 12.4, plt 229 stable. Eating 100% of meals. No signs/symptoms of bleeding noted.  Back in Afib with RVR s/p cardioversion. Increased PTA amiodarone dose to 200 mg twice daily from once daily. Amiodarone can inhibit the metabolism of warfarin and subsequently increase the INR/risk of bleeding. With dose increase planned for the next two weeks, anticipate will need to decrease daily dose.   Goal of Therapy:  INR 2-3 Monitor platelets by anticoagulation protocol: Yes   Plan:  Warfarin 1.5 mg x1 Monitor CBC and s/sx of bleeding Monitor for DDIs and changes in appetite F/u daily INR   Stephenie Acres, PharmD PGY1 Pharmacy Resident 10/02/2023 10:49 AM

## 2023-10-03 DIAGNOSIS — I495 Sick sinus syndrome: Secondary | ICD-10-CM

## 2023-10-03 DIAGNOSIS — I4891 Unspecified atrial fibrillation: Secondary | ICD-10-CM

## 2023-10-03 DIAGNOSIS — R001 Bradycardia, unspecified: Secondary | ICD-10-CM | POA: Diagnosis not present

## 2023-10-03 DIAGNOSIS — I4819 Other persistent atrial fibrillation: Secondary | ICD-10-CM | POA: Diagnosis not present

## 2023-10-03 DIAGNOSIS — I10 Essential (primary) hypertension: Secondary | ICD-10-CM

## 2023-10-03 DIAGNOSIS — I5032 Chronic diastolic (congestive) heart failure: Secondary | ICD-10-CM | POA: Diagnosis not present

## 2023-10-03 LAB — CBC
HCT: 37.9 % (ref 36.0–46.0)
Hemoglobin: 12.9 g/dL (ref 12.0–15.0)
MCH: 30.7 pg (ref 26.0–34.0)
MCHC: 34 g/dL (ref 30.0–36.0)
MCV: 90.2 fL (ref 80.0–100.0)
Platelets: 259 10*3/uL (ref 150–400)
RBC: 4.2 MIL/uL (ref 3.87–5.11)
RDW: 13.8 % (ref 11.5–15.5)
WBC: 7.6 10*3/uL (ref 4.0–10.5)
nRBC: 0 % (ref 0.0–0.2)

## 2023-10-03 LAB — PROTIME-INR
INR: 2.7 — ABNORMAL HIGH (ref 0.8–1.2)
Prothrombin Time: 28.9 s — ABNORMAL HIGH (ref 11.4–15.2)

## 2023-10-03 LAB — BASIC METABOLIC PANEL
Anion gap: 9 (ref 5–15)
BUN: 8 mg/dL (ref 8–23)
CO2: 26 mmol/L (ref 22–32)
Calcium: 9.1 mg/dL (ref 8.9–10.3)
Chloride: 100 mmol/L (ref 98–111)
Creatinine, Ser: 0.75 mg/dL (ref 0.44–1.00)
GFR, Estimated: >60 mL/min (ref >60)
Glucose, Bld: 104 mg/dL — ABNORMAL HIGH (ref 70–99)
Potassium: 4 mmol/L (ref 3.5–5.1)
Sodium: 135 mmol/L (ref 135–145)

## 2023-10-03 MED ORDER — METOPROLOL TARTRATE 50 MG PO TABS
50.0000 mg | ORAL_TABLET | Freq: Two times a day (BID) | ORAL | Status: DC
  Administered 2023-10-03 – 2023-10-05 (×5): 50 mg via ORAL
  Filled 2023-10-03 (×5): qty 1

## 2023-10-03 MED ORDER — WARFARIN SODIUM 1 MG PO TABS
1.5000 mg | ORAL_TABLET | Freq: Once | ORAL | Status: AC
  Administered 2023-10-03: 1.5 mg via ORAL
  Filled 2023-10-03: qty 1

## 2023-10-03 NOTE — Consult Note (Signed)
ELECTROPHYSIOLOGY CONSULT NOTE    Patient ID: INDA NEWBROUGH MRN: [id number], DOB/AGE: 78-03-1946 78 y.o.  Admit date: 09/30/2023 Date of Consult: 10/03/2023  Primary Physician: [name], MD Primary Cardiologist: Christell Constant, MD  Electrophysiologist: new to Dr. Jimmey Ralph   Referring Provider: Dr. Mayford Knife  Patient Profile: Hannah Downs is a 78 y.o. female with a history of persis AFib, mod MR, HTN, HLD, CVA (while on eliquis) who is being seen today for the evaluation of tachy-brady at the request of Dr. Mayford Knife  HPI:  Hannah Downs is a 78 y.o. female with PMH as above.  Her AF was managed on metop, cardizem, and amiodarone. Amio started 07/2023 She is s/p cardioverion 1/16 with junctional bradycardia post-procedure and she was instructed to reduce BB dose by half. She presented to ER 1/17 with chest pressure, SOB, weakness, presyncope with pulses in the high 40s.  BB and CCB both held at admission, amio continued. She initially had improvement in symptoms. Unfortunately, on 1/19 she has recurrence of AFib w RVR up to 120s and metop has been added back at lower dose.   INR has remained therapeutic   On interview, she does usually feel her afib with fatigue and "draggy", but now currently does not feel her arrhythmia. She is anxious to go home with her family. She is frustrated that she feels like she is back at square one and felt worse with trying to treat her AFib. She denies chest pain, palpitations, dyspnea, PND, orthopnea, nausea, vomiting, dizziness, syncope, edema, weight gain, or early satiety.   Labs Potassium4.0 (01/20 0255)   Creatinine, ser  0.75 (01/20 0255) PLT  259 (01/20 0255) HGB  12.9 (01/20 0255) WBC 7.6 (01/20 0255) Troponin I (High Sensitivity)9 (01/17 1719).    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   neurologist--- dr Pearlean Brownie;   11-06-2021  left frontoparietal cortical embolic infarct secondary AFib whilie off anticoagulant for emergent surgery then  post surgery 11-12-2022 on eliquis , acute right cerebellar PICA territory infarct ;  pt now on chronic coumadin, managed by clinic   Hyperlipidemia    Hypertension    Lumbar degenerative disc disease    Osteopenia    PAF (paroxysmal atrial fibrillation) (HCC) 2021   cardiologist-- dr Melburn Popper;  evaluted event monitor 03-26-2020 showed 1% burden  Afib   Presence of pessary    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   Seasonal allergies    Varicose vein    right leg   Vitamin D deficiency    Wears glasses      Surgical History:  Past Surgical History:  Procedure Laterality Date   CARDIOVERSION N/A 08/12/2023   Procedure: CARDIOVERSION;  Surgeon: Sande Rives, MD;  Location: [facility] INVASIVE CV LAB;  Service: Cardiovascular;  Laterality: N/A;   CARDIOVERSION N/A 09/29/2023   Procedure: CARDIOVERSION;  Surgeon: Thomasene Ripple, DO;  Location: [facility] INVASIVE CV LAB;  Service: Cardiovascular;  Laterality: N/A;   COLPOCLEISIS N/A 06/20/2023   Procedure: LEFORTE COLPOCLEISIS; LEVATOR PLICATION WITH PERINEORRHAPHY;  Surgeon: Marguerita Beards, MD;  Location: [facility];  Service: Gynecology;  Laterality: N/A;   CYSTOSCOPY N/A 06/20/2023   Procedure: CYSTOSCOPY;  Surgeon:  Marguerita Beards, MD;  Location: [facility];  Service: Gynecology;  Laterality: N/A;   DILATION AND CURETTAGE OF UTERUS  1998   FEMORAL HERNIA REPAIR Left 11/02/2006   @[facility]   by dr Grier Mitts;   Emergent Inguinal exploratory & primary repair femoral hernia and Reduction of small bowel obstruction   INGUINAL HERNIA REPAIR Left 02/03/2007   @[facility]  by dr Grier Mitts;   Open recurrent repair [facility] w/ mesh   INGUINAL  HERNIA REPAIR N/A 11/04/2021   Procedure: lysis of adhesions reduction of incarcerated recurrent left femoral hernia laparoscopic repair. laparoscopic right femoral hernia repair and right obturator repair with mesh. tap block;  Surgeon: Karie Soda, MD;  Location: [facility];  Service: General;  Laterality: N/A;   ROBOTIC ASSISTED DIAGNOSTIC LAPAROSCOPY  09/18/2021   @[facility]  by dr Judie Petit. Daphine Deutscher;   ATTEMPTED  reduction recurrent inguinal hernia,  aborted due to involvement of bladder and possible vagina   TOTAL HIP ARTHROPLASTY Right 06/26/2014   Procedure: RIGHT TOTAL HIP ARTHROPLASTY ANTERIOR APPROACH WITH ACETABULAR AUTOGRAFT;  Surgeon: Loanne Drilling, MD;  Location: [facility];  Service: Orthopedics;  Laterality: Right;     Medications Prior to Admission  Medication Sig Dispense Refill Last Dose/Taking   acetaminophen (TYLENOL) 500 MG tablet Take 1,000 mg by mouth at bedtime.   09/29/2023 Bedtime   amiodarone (PACERONE) 200 MG tablet Take 200 mg by mouth daily.   09/30/2023 Morning   atorvastatin (LIPITOR) 20 MG tablet Take 1 tablet by mouth once daily 90 tablet 3 09/30/2023 Morning   Cholecalciferol (VITAMIN D3) 50 MCG (2000 UT) TABS Take 2,000 Units by mouth in the morning.   09/30/2023 Morning   estradiol (ESTRACE) 0.1 MG/GM vaginal cream PLACE 0.5 GRAMS VAGINALLY TWICE A WEEK 43 g 0 Past Week   furosemide (LASIX) 40 MG tablet Take 1 tablet (40 mg total) by mouth daily. 90 tablet 3 09/30/2023 Morning   metoprolol tartrate (LOPRESSOR) 100 MG tablet Take 1 tablet (100 mg total) by mouth 2 (two) times daily. 60 tablet 3 09/30/2023 Morning   omeprazole (PRILOSEC OTC) 20 MG tablet Take 20 mg by mouth daily before breakfast.   09/30/2023   potassium chloride (KLOR-CON) 10 MEQ tablet Take 1 tablet by mouth once daily 90 tablet 3 09/30/2023 Morning   trospium (SANCTURA) 20 MG tablet Take 1 tablet (20 mg total) by mouth 2 (two) times daily. 60 tablet 11 09/30/2023 Morning   warfarin (COUMADIN) 5 MG tablet TAKE 1 TO 1  & 1/2 (ONE & ONE-HALF) TABLETS BY MOUTH ONCE DAILY (Patient taking differently: Take 2.5 mg by mouth daily.) 60 tablet 1 09/29/2023 at  4:00 PM   diltiazem (CARDIZEM CD) 120 MG 24 hr capsule Take 1 capsule (120 mg total) by mouth in the morning and at bedtime. (Patient not taking: Reported on 09/30/2023) 180 capsule 3 Not Taking    Inpatient Medications:   amiodarone  200 mg Oral BID   atorvastatin  20 mg Oral Daily   cholecalciferol  2,000 Units Oral q AM   furosemide  20 mg Oral Daily   metoprolol tartrate  50 mg Oral BID   pantoprazole  40 mg Oral Daily   sodium chloride flush  3 mL Intravenous Q12H   trospium  20 mg Oral BID   warfarin  1.5 mg Oral ONCE-1600   Warfarin - Pharmacist Dosing Inpatient   Does not apply q1600    Allergies:  Allergies  Allergen Reactions   Thiazide-Type Diuretics Other (See  Comments)    Hyponatremia    Zestril [Lisinopril] Cough    Family History  Problem Relation Age of Onset   Cancer Mother    Stroke Father      Physical Exam: Vitals:   10/02/23 2008 10/03/23 0005 10/03/23 0113 10/03/23 0359  BP: (!) 132/97 (!) 131/94 121/80 (!) 132/92  Pulse: (!) 115  99 (!) 104  Resp: 18 17 18 18   Temp: 98.1 F (36.7 C) 98.2 F (36.8 C) 98.4 F (36.9 C) 98.4 F (36.9 C)  TempSrc: Oral Oral Oral Oral  SpO2: 97% 97% 99% 97%  Weight:    55 kg  Height:        GEN- NAD, A&O x 3, normal affect HEENT: Normocephalic, atraumatic Lungs- CTAB, Normal effort.  Heart- irregular rate and rhythm, No M/G/R.  GI- Soft, NT, ND.  Extremities- No clubbing, cyanosis, or edema   Radiology/Studies: DG Chest 2 View Result Date: 09/30/2023 CLINICAL DATA:  Short of breath, chest pressure, history of atrial fibrillation with cardioversion yesterday EXAM: CHEST - 2 VIEW COMPARISON:  06/23/2023 FINDINGS: Frontal and lateral views of the chest demonstrates stable enlargement of the cardiac silhouette. Minimal residual blunting of the right costophrenic angle may reflect  pleural thickening or trace effusion. No airspace disease or pneumothorax. No acute bony abnormalities. IMPRESSION: 1. Trace residual right pleural effusion versus pleural thickening. 2. Stable enlarged cardiac silhouette. Electronically Signed   By: Sharlet Salina M.D.   On: 09/30/2023 14:59   EP STUDY Result Date: 09/30/2023 See surgical note for result.   EKG: 1/19 - Afib w RVR at 126bpm 1/16 - junctional bradycardia at 53bpm (personally reviewed)   TELEMETRY:  junctional bradycardia > Afib w RVR (personally reviewed)    Assessment/Plan: #) persis AFib #) tachybrady Patient not inclined to further treat her AFib, or implant PPM at this time Long discussion regarding rhythm vs rate control, at this time patient is very clear in her desire for rate control.  Will stop amiodarone Goal HR < 110    #) Hypercoag d/t persis afib #) CVA (while on eliquis) CHA2DS2-VASc Score = at least 7 [CHF History: 0, HTN History: 1, Diabetes History: 0, Stroke History: 2, Vascular Disease History: 1, Age Score: 2, Gender Score: 1].  Therefore, the patient's annual risk of stroke is 11.2 %.    Stroke ppx - warfarin, INR goal 2-3    Dr. Jimmey Ralph has seen  For questions or updates, please contact [facility] Please consult [email or web address] for contact info under Cardiology/STEMI.  Signed, Sherie Don, NP  10/03/2023 10:45 AM

## 2023-10-03 NOTE — Progress Notes (Signed)
Rounding Note    Patient Name: Hannah Downs Date of Encounter: 10/03/2023  [facility] Cardiologist: Christell Constant, MD   Subjective   Denies any cardiac awareness, no CP or SOB. Had some SOB when first admitted, symptom resolved.   Inpatient Medications    Scheduled Meds:  amiodarone  200 mg Oral BID   atorvastatin  20 mg Oral Daily   cholecalciferol  2,000 Units Oral q AM   furosemide  20 mg Oral Daily   metoprolol tartrate  25 mg Oral BID   pantoprazole  40 mg Oral Daily   sodium chloride flush  3 mL Intravenous Q12H   trospium  20 mg Oral BID   warfarin  1.5 mg Oral ONCE-1600   Warfarin - Pharmacist Dosing Inpatient   Does not apply q1600   Continuous Infusions:  PRN Meds: acetaminophen, sodium chloride flush   Vital Signs    Vitals:   10/02/23 2008 10/03/23 0005 10/03/23 0113 10/03/23 0359  BP: (!) 132/97 (!) 131/94 121/80 (!) 132/92  Pulse: (!) 115  99 (!) 104  Resp: 18 17 18 18   Temp: 98.1 F (36.7 C) 98.2 F (36.8 C) 98.4 F (36.9 C) 98.4 F (36.9 C)  TempSrc: Oral Oral Oral Oral  SpO2: 97% 97% 99% 97%  Weight:    55 kg  Height:        Intake/Output Summary (Last 24 hours) at 10/03/2023 0843 Last data filed at 10/02/2023 1940 Gross per 24 hour  Intake 857 ml  Output --  Net 857 ml      10/03/2023    3:59 AM 10/02/2023    4:28 AM 10/01/2023   12:02 AM  Last 3 Weights  Weight (lbs) 121 lb 4.8 oz 122 lb 131 lb  Weight (kg) 55.021 kg 55.339 kg 59.421 kg      Telemetry    Atrial fibrillation with HR 120s - Personally Reviewed  ECG    Atrial fibrillation with HR 120s - Personally Reviewed  Physical Exam   GEN: No acute distress.   Neck: No JVD Cardiac: irregularly irregular, no murmurs, rubs, or gallops.  Respiratory: Clear to auscultation bilaterally. GI: Soft, nontender, non-distended  MS: No edema; No deformity. Neuro:  Nonfocal  Psych: Normal affect   Labs    High Sensitivity Troponin:   Recent Labs   Lab 09/30/23 1318 09/30/23 1719  TROPONINIHS 9 9     Chemistry Recent Labs  Lab 10/01/23 0233 10/02/23 0246 10/03/23 0255  NA 132* 136 135  K 3.7 3.1* 4.0  CL 100 103 100  CO2 23 27 26   GLUCOSE 103* 98 104*  BUN 16 7* 8  CREATININE 0.90 0.68 0.75  CALCIUM 9.1 8.8* 9.1  PROT 6.2*  --   --   ALBUMIN 3.4*  --   --   AST 43*  --   --   ALT 33  --   --   ALKPHOS 66  --   --   BILITOT 1.8*  --   --   GFRNONAA >60 >60 >60  ANIONGAP 9 6 9     Lipids No results for input(s): "CHOL", "TRIG", "HDL", "LABVLDL", "LDLCALC", "CHOLHDL" in the last 168 hours.  Hematology Recent Labs  Lab 10/01/23 0233 10/02/23 0246 10/03/23 0255  WBC 8.1 6.6 7.6  RBC 3.98 4.08 4.20  HGB 12.1 12.4 12.9  HCT 35.6* 36.6 37.9  MCV 89.4 89.7 90.2  MCH 30.4 30.4 30.7  MCHC 34.0 33.9 34.0  RDW 13.5 13.9 13.8  PLT 237 229 259   Thyroid  Recent Labs  Lab 09/30/23 1719  TSH 4.856*    BNP Recent Labs  Lab 09/30/23 1316  BNP 606.7*    DDimer No results for input(s): "DDIMER" in the last 168 hours.   Radiology    No results found.  Cardiac Studies   Echo 06/23/2023  1. Left ventricular ejection fraction, by estimation, is 60 to 65%. The  left ventricle has normal function. The left ventricle has no regional  wall motion abnormalities. Left ventricular diastolic parameters were  normal.   2. Right ventricular systolic function is normal. The right ventricular  size is normal. There is moderately elevated pulmonary artery systolic  pressure. The estimated right ventricular systolic pressure is 45.0 mmHg.   3. Left atrial size was upper limit of normal.   4. Right atrial size was dilated.   5. The mitral valve is normal in structure. Moderate mitral valve  regurgitation. No evidence of mitral stenosis.   6. Tricuspid valve regurgitation is moderate to severe.   7. The aortic valve is normal in structure. Aortic valve regurgitation is  not visualized. No aortic stenosis is present.   8.  The inferior vena cava is dilated in size with <50% respiratory  variability, suggesting right atrial pressure of 15 mmHg.   Comparison(s): A prior study was performed on 11/08/2021. Grade 2  diastolic dysfunction is now normal. Mitral / tricuspid valve  regurgitation and estimated RAP of are new findings compared to  prior study.   Patient Profile     78 y.o. female with PMH of persistent atrial fibrillation, HFpEF, moderate MR, HTN, HLD, GERD and h/o CVA who recently underwent outpatient DCCV on 09/29/2023. Post cardioversion EKG showed junctional rhythm with HR 53 bpm. Patient instructed to decrease metoprolol by half to 50mg  BID. Cardizem and amiodarone continued. She returned on 1/17 with dizziness and feeling of passing out in the setting of junctional bradycardia. Metoprolol and cardizem discontinued.     Assessment & Plan    Junctional bradycardia  - first noted after recent cardioversion, became symptomatic after going home - readmitted with symptomatic junctional brady - developed recurrent afib during this admission  PAF s/p cardioversion on 09/30/2023: on amiodarone  - recurrent afib during this admission.  - cardizem and metoprolol initially stopped, low dose metoprolol restarted to help with rate control on amiodarone - HR 110-120s overnight, will increase metoprolol to 50mg  BID. Discuss with the patient that she remain in afib RVR, may need to consider repeat cardioversion once amiodarone is loaded some more, she is not enthusiastic about the idea of repeat cardioversion  Hyponatremia: resolved  Hypertension  HFpEF  For questions or updates, please contact [facility] Please consult [email or web address] for contact info under        Signed, Azalee Course, PA  10/03/2023, 8:43 AM

## 2023-10-03 NOTE — Progress Notes (Signed)
PHARMACY - ANTICOAGULATION CONSULT NOTE  Pharmacy Consult for Warfarin Indication: atrial fibrillation  Allergies  Allergen Reactions   Thiazide-Type Diuretics Other (See Comments)    Hyponatremia    Zestril [Lisinopril] Cough    Patient Measurements: Height: 4\' 11"  (149.9 cm) Weight: 55 kg (121 lb 4.8 oz) IBW/kg (Calculated) : 43.2  Vital Signs: Temp: 98.4 F (36.9 C) (01/20 0359) Temp Source: Oral (01/20 0359) BP: 132/92 (01/20 0359) Pulse Rate: 104 (01/20 0359)  Labs: Recent Labs    09/30/23 1318 09/30/23 1719 10/01/23 0233 10/02/23 0246 10/03/23 0255  HGB 12.6  --  12.1 12.4 12.9  HCT 37.7  --  35.6* 36.6 37.9  PLT 271  --  237 229 259  LABPROT 26.2*  --  26.9* 29.3* 28.9*  INR 2.4*  --  2.5* 2.7* 2.7*  CREATININE 1.26*  --  0.90 0.68 0.75  TROPONINIHS 9 9  --   --   --     Estimated Creatinine Clearance: 44.5 mL/min (by C-G formula based on SCr of 0.75 mg/dL).   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   neurologist--- dr Pearlean Brownie;   11-06-2021  left frontoparietal cortical embolic infarct secondary AFib whilie off anticoagulant for emergent surgery then  post surgery 11-12-2022 on eliquis , acute right cerebellar PICA territory infarct ;  pt now on chronic coumadin, managed by clinic   Hyperlipidemia    Hypertension    Lumbar degenerative disc disease    Osteopenia    PAF (paroxysmal atrial fibrillation) (HCC) 2021   cardiologist-- dr Melburn Popper;  evaluted event monitor 03-26-2020 showed 1% burden  Afib   Presence of pessary    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   Seasonal allergies    Varicose vein    right leg   Vitamin D deficiency    Wears glasses    Assessment: 77YOF with h/o  atrial fibrillation on warfarin who presents with SOB and chest pressure s/p cardioversion 1/17. PTA warfarin dose 2.5mg  daily. Confirmed with patient that she has not taken warfarin today. Pharmacy has been consulted to dose warfarin.  INR 2.7 (therapeutic) following PTA dose 2.5 mg warfarin yesterday. Hgb 12.4, plt 229 stable. Eating 100% of meals. No signs/symptoms of bleeding noted.  Back in Afib with RVR s/p cardioversion. Increased PTA amiodarone dose to 200 mg twice daily from once daily. Amiodarone can inhibit the metabolism of warfarin and subsequently increase the INR/risk of bleeding. With dose increase planned for the next two weeks, anticipate will need to decrease daily dose.   1/20 AM update: INR 2.7 Hgb / PLT wnl  Goal of Therapy:  INR 2-3 Monitor platelets by anticoagulation protocol: Yes   Plan:  Warfarin 1.5 mg x1 Monitor CBC and s/sx of bleeding Monitor for DDIs and changes in appetite F/u daily INR     Brycin Kille BS, PharmD, BCPS Clinical Pharmacist 10/03/2023 7:34 AM  Contact: [phone] after 3 PM  "Be curious, not judgmental..." -Debbora Dus

## 2023-10-03 NOTE — Plan of Care (Signed)
  Problem: Pain Managment: Goal: General experience of comfort will improve and/or be controlled Outcome: Progressing   Problem: Safety: Goal: Ability to remain free from injury will improve Outcome: Progressing   Problem: Skin Integrity: Goal: Risk for impaired skin integrity will decrease Outcome: Progressing

## 2023-10-04 DIAGNOSIS — R001 Bradycardia, unspecified: Secondary | ICD-10-CM | POA: Diagnosis not present

## 2023-10-04 DIAGNOSIS — I5032 Chronic diastolic (congestive) heart failure: Secondary | ICD-10-CM | POA: Diagnosis not present

## 2023-10-04 DIAGNOSIS — I10 Essential (primary) hypertension: Secondary | ICD-10-CM | POA: Diagnosis not present

## 2023-10-04 DIAGNOSIS — I4891 Unspecified atrial fibrillation: Secondary | ICD-10-CM | POA: Diagnosis not present

## 2023-10-04 LAB — CBC
HCT: 37.3 % (ref 36.0–46.0)
Hemoglobin: 12.6 g/dL (ref 12.0–15.0)
MCH: 30.5 pg (ref 26.0–34.0)
MCHC: 33.8 g/dL (ref 30.0–36.0)
MCV: 90.3 fL (ref 80.0–100.0)
Platelets: 260 10*3/uL (ref 150–400)
RBC: 4.13 MIL/uL (ref 3.87–5.11)
RDW: 13.8 % (ref 11.5–15.5)
WBC: 7.4 10*3/uL (ref 4.0–10.5)
nRBC: 0 % (ref 0.0–0.2)

## 2023-10-04 LAB — PROTIME-INR
INR: 2.4 — ABNORMAL HIGH (ref 0.8–1.2)
Prothrombin Time: 26.6 s — ABNORMAL HIGH (ref 11.4–15.2)

## 2023-10-04 MED ORDER — WARFARIN SODIUM 2.5 MG PO TABS
2.5000 mg | ORAL_TABLET | Freq: Once | ORAL | Status: AC
  Administered 2023-10-04: 2.5 mg via ORAL
  Filled 2023-10-04: qty 1

## 2023-10-04 NOTE — Plan of Care (Signed)
  Problem: Education: Goal: Knowledge of General Education information will improve Description Including pain rating scale, medication(s)/side effects and non-pharmacologic comfort measures Outcome: Progressing   Problem: Health Behavior/Discharge Planning: Goal: Ability to manage health-related needs will improve Outcome: Progressing   

## 2023-10-04 NOTE — TOC Initial Note (Addendum)
Transition of Care [facility]) - Initial/Assessment Note    Patient Details  Name: Hannah Downs MRN: [id number] Date of Birth: Jan 14, 1946  [facility] CM/SW Contact:    Leone Haven, RN Phone Number: 10/04/2023, 12:35 PM  Clinical Narrative:                 From home with spouse and daughter, has PCP  [name] , the doctors office will call patient to make follow up apt per patient and daughter and insurance on file, states has no HH services in place at this time , has cane and walker at home.  States daughter will transport her home at [employer] and family is support system, states gets medications from [facility] on  [address].  She pays out of pocket for her meds,she does not have medication coverage.  Pta self ambulatory with walker.   Expected Discharge Plan: Home/Self Care Barriers to Discharge: Continued Medical Work up   Patient Goals and CMS Choice Patient states their goals for this hospitalization and ongoing recovery are:: return home   Choice offered to / list presented to : NA      Expected Discharge Plan and Services In-house Referral: NA Discharge Planning Services: CM Consult Post Acute Care Choice: NA Living arrangements for the past 2 months: Single Family Home                 DME Arranged: N/A DME Agency: NA       HH Arranged: NA          Prior Living Arrangements/Services Living arrangements for the past 2 months: Single Family Home Lives with:: Adult Children, Spouse Patient language and need for interpreter reviewed:: Yes Do you feel safe going back to the place where you live?: Yes      Need for Family Participation in Patient Care: Yes (Comment) Care giver support system in place?: Yes (comment) Current home services: DME (walker and a cane) Criminal Activity/Legal Involvement Pertinent to Current Situation/Hospitalization: No - Comment as needed  Activities of Daily Living   ADL Screening (condition at time of  admission) Independently performs ADLs?: Yes (appropriate for developmental age) Is the patient deaf or have difficulty hearing?: No Does the patient have difficulty seeing, even when wearing glasses/contacts?: No Does the patient have difficulty concentrating, remembering, or making decisions?: No  Permission Sought/Granted Permission sought to share information with : Case Manager Permission granted to share information with : Yes, Verbal Permission Granted              Emotional Assessment Appearance:: Appears stated age Attitude/Demeanor/Rapport: Engaged Affect (typically observed): Appropriate Orientation: : Oriented to Self, Oriented to Place, Oriented to  Time, Oriented to Situation Alcohol / Substance Use: Not Applicable Psych Involvement: No (comment)  Admission diagnosis:  Junctional bradycardia [R00.1] Symptomatic bradycardia [R00.1] Patient Active Problem List   Diagnosis Date Noted   Symptomatic bradycardia 10/01/2023   Junctional bradycardia 09/30/2023   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   Nonrheumatic mitral valve regurgitation 07/19/2023   Atrial fibrillation with RVR (HCC) 06/22/2023   (HFpEF) heart failure with preserved ejection fraction (HCC) 06/22/2023   Atrial fib/flutter, transient (HCC) 06/20/2023   Atrial fibrillation (HCC) 06/20/2023   Hypotension 06/20/2023   Hx of stroke without residual deficits 06/20/2023   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 organs 06/20/2023   Postoperative seroma involving digestive system after digestive system procedure 11/12/2021   CVA (cerebral vascular accident) (HCC) 11/11/2021   Stroke (HCC) 11/07/2021  Asymptomatic bacteriuria 11/06/2021   High bilirubin 11/06/2021   COVID-19 virus detected 11/05/2021   Hyponatremia 11/05/2021   Incarcerated femoral hernia 11/05/2021   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 11/05/2021 11/05/2021   Femoral hernia of left side 11/05/2021    Hypomagnesemia 11/05/2021   Leukocytosis 11/05/2021   SBO (small bowel obstruction) (HCC) 11/04/2021   Chronic anticoagulation 11/04/2021   Lumbar degenerative disc disease 11/04/2021   Seasonal allergies 11/04/2021   Adjustment disorder 10/30/2021   Acquired thrombophilia (HCC) 10/30/2021   Aortic atherosclerosis (HCC) 08/04/2021   Essential hypertension 08/04/2021   Hyperlipidemia 08/04/2021   Gastroesophageal reflux disease without esophagitis 08/04/2021   Vitamin D deficiency 08/04/2021   Paroxysmal atrial fibrillation (HCC) 04/02/2020   OA (osteoarthritis) of hip 06/26/2014   PCP:  [name], MD Pharmacy:   [facility] [address] ([address]), [place] - [address] [place] ([place]) [place] [address] Phone: [phone] Fax: [phone]     Social Drivers of Health (SDOH) Social History: SDOH Screenings   Food Insecurity: No Food Insecurity (09/30/2023)  Housing: Low Risk  (09/30/2023)  Transportation Needs: No Transportation Needs (09/30/2023)  Utilities: Not At Risk (09/30/2023)  Social Connections: Moderately Isolated (09/30/2023)  Tobacco Use: Low Risk  (09/30/2023)   SDOH Interventions:     Readmission Risk Interventions    06/21/2023   10:50 AM  Readmission Risk Prevention Plan  Post Dischage Appt Complete  Medication Screening Complete  Transportation Screening Complete

## 2023-10-04 NOTE — Care Management Important Message (Signed)
Important Message  Patient Details  Name: Hannah Downs MRN: [id number] Date of Birth: October 05, 1945   Important Message Given:  Yes - Medicare IM     Renie Ora 10/04/2023, 12:36 PM

## 2023-10-04 NOTE — Progress Notes (Addendum)
Rounding Note    Patient Name: Hannah Downs Date of Encounter: 10/04/2023  [facility] Cardiologist: Christell Constant, MD   Subjective   Denies any CP or SOB.  No awareness of being in afib.  EP consult yesterday appreciated and will pursue rate control.  Amio stopped and started on Lopressor with HR in the 80-90's  Inpatient Medications    Scheduled Meds:  atorvastatin  20 mg Oral Daily   cholecalciferol  2,000 Units Oral q AM   furosemide  20 mg Oral Daily   metoprolol tartrate  50 mg Oral BID   pantoprazole  40 mg Oral Daily   sodium chloride flush  3 mL Intravenous Q12H   trospium  20 mg Oral BID   Warfarin - Pharmacist Dosing Inpatient   Does not apply q1600   Continuous Infusions:  PRN Meds: acetaminophen, sodium chloride flush   Vital Signs    Vitals:   10/03/23 1617 10/03/23 1948 10/04/23 0039 10/04/23 0510  BP: (!) 117/95 (!) 131/101 106/78 122/87  Pulse: 96 99 95 91  Resp: 17 18 18 18   Temp: 98.5 F (36.9 C) 97.9 F (36.6 C) 98.2 F (36.8 C) 97.8 F (36.6 C)  TempSrc: Oral Oral Oral Oral  SpO2: 94% 97% 95% 96%  Weight:    54.4 kg  Height:        Intake/Output Summary (Last 24 hours) at 10/04/2023 0732 Last data filed at 10/03/2023 1812 Gross per 24 hour  Intake 343 ml  Output --  Net 343 ml      10/04/2023    5:10 AM 10/03/2023    3:59 AM 10/02/2023    4:28 AM  Last 3 Weights  Weight (lbs) 119 lb 14.4 oz 121 lb 4.8 oz 122 lb  Weight (kg) 54.386 kg 55.021 kg 55.339 kg      Telemetry    Afib in the 80-90's - Personally Reviewed  ECG    No new EKG to review - Personally Reviewed  Physical Exam   GEN: Well nourished, well developed in no acute distress HEENT: Normal NECK: No JVD; No carotid bruits LYMPHATICS: No lymphadenopathy CARDIAC:irregularly irregular, no murmurs, rubs, gallops RESPIRATORY:  Clear to auscultation without rales, wheezing or rhonchi  ABDOMEN: Soft, non-tender,  non-distended MUSCULOSKELETAL:  No edema; No deformity  SKIN: Warm and dry NEUROLOGIC:  Alert and oriented x 3 PSYCHIATRIC:  Normal affect   Labs    High Sensitivity Troponin:   Recent Labs  Lab 09/30/23 1318 09/30/23 1719  TROPONINIHS 9 9     Chemistry Recent Labs  Lab 10/01/23 0233 10/02/23 0246 10/03/23 0255  NA 132* 136 135  K 3.7 3.1* 4.0  CL 100 103 100  CO2 23 27 26   GLUCOSE 103* 98 104*  BUN 16 7* 8  CREATININE 0.90 0.68 0.75  CALCIUM 9.1 8.8* 9.1  PROT 6.2*  --   --   ALBUMIN 3.4*  --   --   AST 43*  --   --   ALT 33  --   --   ALKPHOS 66  --   --   BILITOT 1.8*  --   --   GFRNONAA >60 >60 >60  ANIONGAP 9 6 9     Lipids No results for input(s): "CHOL", "TRIG", "HDL", "LABVLDL", "LDLCALC", "CHOLHDL" in the last 168 hours.  Hematology Recent Labs  Lab 10/02/23 0246 10/03/23 0255 10/04/23 0250  WBC 6.6 7.6 7.4  RBC 4.08 4.20 4.13  HGB 12.4  12.9 12.6  HCT 36.6 37.9 37.3  MCV 89.7 90.2 90.3  MCH 30.4 30.7 30.5  MCHC 33.9 34.0 33.8  RDW 13.9 13.8 13.8  PLT 229 259 260   Thyroid  Recent Labs  Lab 09/30/23 1719  TSH 4.856*    BNP Recent Labs  Lab 09/30/23 1316  BNP 606.7*    DDimer No results for input(s): "DDIMER" in the last 168 hours.   Radiology    No results found.  Cardiac Studies   Echo 06/23/2023  1. Left ventricular ejection fraction, by estimation, is 60 to 65%. The  left ventricle has normal function. The left ventricle has no regional  wall motion abnormalities. Left ventricular diastolic parameters were  normal.   2. Right ventricular systolic function is normal. The right ventricular  size is normal. There is moderately elevated pulmonary artery systolic  pressure. The estimated right ventricular systolic pressure is 45.0 mmHg.   3. Left atrial size was upper limit of normal.   4. Right atrial size was dilated.   5. The mitral valve is normal in structure. Moderate mitral valve  regurgitation. No evidence of mitral  stenosis.   6. Tricuspid valve regurgitation is moderate to severe.   7. The aortic valve is normal in structure. Aortic valve regurgitation is  not visualized. No aortic stenosis is present.   8. The inferior vena cava is dilated in size with <50% respiratory  variability, suggesting right atrial pressure of 15 mmHg.   Comparison(s): A prior study was performed on 11/08/2021. Grade 2  diastolic dysfunction is now normal. Mitral / tricuspid valve  regurgitation and estimated RAP of are new findings compared to  prior study.   Patient Profile     78 y.o. female with PMH of persistent atrial fibrillation, HFpEF, moderate MR, HTN, HLD, GERD and h/o CVA who recently underwent outpatient DCCV on 09/29/2023. Post cardioversion EKG showed junctional rhythm with HR 53 bpm. Patient instructed to decrease metoprolol by half to 50mg  BID. Cardizem and amiodarone continued. She returned on 1/17 with dizziness and feeling of passing out in the setting of junctional bradycardia. Metoprolol and cardizem discontinued.     Assessment & Plan    Junctional bradycardia - first noted after recent cardioversion, became symptomatic after going home - readmitted with symptomatic junctional brady and BB stopped - developed recurrent afib during this admission  PAF s/p cardioversion on 09/30/2023: on amiodarone - recurrent afib during this admission despite Amio load - cardizem and metoprolol initially stopped, low dose metoprolol restarted yesterday to help with rate control in addition to amiodarone - appreciated EP consult for tachybrady syndrome>>recommended stopping Amio and pursing rate control with Lopressor since she has no cardiac awareness of her afib -HR improved today on Lopressor 50mg  BID>>still at times goes to low 100's -reassess in am and may need to increase Lopressor to 75mg  BID -continue warfarin>>INR 2.4  Hyponatremia:  -resolved>>Na 135 today  Hypertension -PTA Cardizem stopped and  Lopressor initially decreased from 100mg  BID to 25mg  BID -increased Lopressor to 50mg  BID yesterday for HR control with stable BP today  HFpEF -appears euvolemic on exam today -Continue Lasix 20mg  daily, Lopressor 50mg  BID -consider addition of SGLT2i as outpt  For questions or updates, please contact [facility] Please consult [email or web address] for contact info under        Signed, Armanda Magic, MD  10/04/2023, 7:32 AM

## 2023-10-04 NOTE — Plan of Care (Signed)
  Problem: Clinical Measurements: Goal: Ability to maintain clinical measurements within normal limits will improve Outcome: Progressing Goal: Will remain free from infection Outcome: Progressing Goal: Cardiovascular complication will be avoided Outcome: Progressing   Problem: Activity: Goal: Risk for activity intolerance will decrease Outcome: Progressing

## 2023-10-04 NOTE — Progress Notes (Signed)
PHARMACY - ANTICOAGULATION CONSULT NOTE  Pharmacy Consult for Warfarin Indication: atrial fibrillation  Allergies  Allergen Reactions   Thiazide-Type Diuretics Other (See Comments)    Hyponatremia    Zestril [Lisinopril] Cough    Patient Measurements: Height: 4\' 11"  (149.9 cm) Weight: 54.4 kg (119 lb 14.4 oz) IBW/kg (Calculated) : 43.2  Vital Signs: Temp: 97.8 F (36.6 C) (01/21 0725) Temp Source: Oral (01/21 0725) BP: 123/105 (01/21 0725) Pulse Rate: 120 (01/21 0725)  Labs: Recent Labs    10/02/23 0246 10/03/23 0255 10/04/23 0250  HGB 12.4 12.9 12.6  HCT 36.6 37.9 37.3  PLT 229 259 260  LABPROT 29.3* 28.9* 26.6*  INR 2.7* 2.7* 2.4*  CREATININE 0.68 0.75  --     Estimated Creatinine Clearance: 44.3 mL/min (by C-G formula based on SCr of 0.75 mg/dL).   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   neurologist--- dr Pearlean Brownie;   11-06-2021  left frontoparietal cortical embolic infarct secondary AFib whilie off anticoagulant for emergent surgery then  post surgery 11-12-2022 on eliquis , acute right cerebellar PICA territory infarct ;  pt now on chronic coumadin, managed by clinic   Hyperlipidemia    Hypertension    Lumbar degenerative disc disease    Osteopenia    PAF (paroxysmal atrial fibrillation) (HCC) 2021   cardiologist-- dr Melburn Popper;  evaluted event monitor 03-26-2020 showed 1% burden  Afib   Presence of pessary    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 PTA warfarin dose 2.5mg   daily. Pharmacy has been consulted to manage warfarin.  Pt back in Afib with RVR s/p cardioversion. PTA amiodarone was increased, but now has been discontinued. Amiodarone can inhibit the metabolism of warfarin and subsequently increase the INR/risk of bleeding.   INR remains therapeutic today at 2.4, Hgb/Plt stable WNL PO intake documented anywhere between 15% to 100%  Goal of Therapy:  INR 2-3 Monitor platelets by anticoagulation protocol: Yes   Plan:  Warfarin 2.5 mg PO x1 Monitor daily INR, CBC, clinical course, s/sx of bleed, PO intake/diet, Drug-Drug Interactions   Thank you for allowing pharmacy to be a part of this patient's care.   Signe Colt, PharmD 10/04/2023 7:45 AM  **Pharmacist phone directory can be found on [email or web address] listed under [facility]**

## 2023-10-05 ENCOUNTER — Other Ambulatory Visit: Payer: Self-pay | Admitting: Cardiology

## 2023-10-05 ENCOUNTER — Inpatient Hospital Stay (INDEPENDENT_AMBULATORY_CARE_PROVIDER_SITE_OTHER): Payer: Medicare Other

## 2023-10-05 ENCOUNTER — Other Ambulatory Visit (HOSPITAL_COMMUNITY): Payer: Self-pay

## 2023-10-05 DIAGNOSIS — I5032 Chronic diastolic (congestive) heart failure: Secondary | ICD-10-CM | POA: Diagnosis not present

## 2023-10-05 DIAGNOSIS — I480 Paroxysmal atrial fibrillation: Secondary | ICD-10-CM

## 2023-10-05 DIAGNOSIS — R001 Bradycardia, unspecified: Secondary | ICD-10-CM

## 2023-10-05 DIAGNOSIS — I4891 Unspecified atrial fibrillation: Secondary | ICD-10-CM | POA: Diagnosis not present

## 2023-10-05 DIAGNOSIS — I10 Essential (primary) hypertension: Secondary | ICD-10-CM | POA: Diagnosis not present

## 2023-10-05 LAB — CBC
HCT: 37.2 % (ref 36.0–46.0)
Hemoglobin: 12.5 g/dL (ref 12.0–15.0)
MCH: 30.3 pg (ref 26.0–34.0)
MCHC: 33.6 g/dL (ref 30.0–36.0)
MCV: 90.3 fL (ref 80.0–100.0)
Platelets: 215 10*3/uL (ref 150–400)
RBC: 4.12 MIL/uL (ref 3.87–5.11)
RDW: 13.7 % (ref 11.5–15.5)
WBC: 6.5 10*3/uL (ref 4.0–10.5)
nRBC: 0 % (ref 0.0–0.2)

## 2023-10-05 LAB — PROTIME-INR
INR: 2.2 — ABNORMAL HIGH (ref 0.8–1.2)
Prothrombin Time: 24.5 s — ABNORMAL HIGH (ref 11.4–15.2)

## 2023-10-05 MED ORDER — METOPROLOL TARTRATE 50 MG PO TABS
50.0000 mg | ORAL_TABLET | Freq: Two times a day (BID) | ORAL | 3 refills | Status: DC
  Filled 2023-10-05: qty 60, 30d supply, fill #0

## 2023-10-05 MED ORDER — WARFARIN SODIUM 5 MG PO TABS: 2.5000 mg | ORAL_TABLET | Freq: Every day | ORAL | Status: DC

## 2023-10-05 MED ORDER — WARFARIN SODIUM 2.5 MG PO TABS: 2.5000 mg | ORAL_TABLET | Freq: Every day | ORAL | Status: DC

## 2023-10-05 MED ORDER — FUROSEMIDE 20 MG PO TABS
20.0000 mg | ORAL_TABLET | Freq: Every day | ORAL | 3 refills | Status: DC
  Filled 2023-10-05: qty 30, 30d supply, fill #0

## 2023-10-05 NOTE — Progress Notes (Signed)
Discharge instructions reviewed with pt and her daughter.   Copy of instructions given to pt. [facility] spoke with pt on the phone pt states, her meds are not due for a refill that MD sent scripts in for, therefore pt will not need to stop by the pharmacy.  Pt will be d/c'd via wheelchair with belongings, with daughter and escorted by hospital volunteer.   Bardia Wangerin,RN SWOT

## 2023-10-05 NOTE — Progress Notes (Signed)
PHARMACY - ANTICOAGULATION CONSULT NOTE  Pharmacy Consult for Warfarin Indication: atrial fibrillation  Allergies  Allergen Reactions   Thiazide-Type Diuretics Other (See Comments)    Hyponatremia    Zestril [Lisinopril] Cough    Patient Measurements: Height: 4\' 11"  (149.9 cm) Weight: 54.9 kg (121 lb 1.6 oz) IBW/kg (Calculated) : 43.2  Vital Signs: Temp: 97.6 F (36.4 C) (01/22 0432) Temp Source: Oral (01/22 0432) BP: 131/102 (01/22 0432) Pulse Rate: 87 (01/22 0432)  Labs: Recent Labs    10/03/23 0255 10/04/23 0250 10/05/23 0232  HGB 12.9 12.6 12.5  HCT 37.9 37.3 37.2  PLT 259 260 215  LABPROT 28.9* 26.6* 24.5*  INR 2.7* 2.4* 2.2*  CREATININE 0.75  --   --     Estimated Creatinine Clearance: 44.5 mL/min (by C-G formula based on SCr of 0.75 mg/dL).   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   neurologist--- dr Pearlean Brownie;   11-06-2021  left frontoparietal cortical embolic infarct secondary AFib whilie off anticoagulant for emergent surgery then  post surgery 11-12-2022 on eliquis , acute right cerebellar PICA territory infarct ;  pt now on chronic coumadin, managed by clinic   Hyperlipidemia    Hypertension    Lumbar degenerative disc disease    Osteopenia    PAF (paroxysmal atrial fibrillation) (HCC) 2021   cardiologist-- dr Melburn Popper;  evaluted event monitor 03-26-2020 showed 1% burden  Afib   Presence of pessary    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 PTA warfarin dose 2.5mg   daily. Pharmacy has been consulted to manage warfarin.  Pt back in Afib with RVR s/p cardioversion. PTA amiodarone was increased, but now has been discontinued. Amiodarone can inhibit the metabolism of warfarin and subsequently increase the INR/risk of bleeding.   INR remains therapeutic today at 2.2, Hgb/Plt stable WNL PO intake documented anywhere between 15% to 100% with 1/21 at 50%  Goal of Therapy:  INR 2-3 Monitor platelets by anticoagulation protocol: Yes   Plan:  Warfarin 2.5 mg PO daily Monitor daily INR, CBC, clinical course, s/sx of bleed, PO intake/diet, Drug-Drug Interactions   Thank you for allowing pharmacy to be a part of this patient's care.   Greta Doom BS, PharmD, BCPS Clinical Pharmacist 10/05/2023 7:20 AM  Contact: [phone] after 3 PM  "Be curious, not judgmental..." -Debbora Dus

## 2023-10-05 NOTE — Progress Notes (Signed)
RN returned patient home med trospium to patient at bedside. Medication log signed and placed in chart.

## 2023-10-05 NOTE — Plan of Care (Signed)
  Problem: Clinical Measurements: Goal: Ability to maintain clinical measurements within normal limits will improve Outcome: Progressing   Problem: Clinical Measurements: Goal: Will remain free from infection Outcome: Progressing   

## 2023-10-05 NOTE — TOC Transition Note (Signed)
Transition of Care [facility]) - Discharge Note   Patient Details  Name: PLUMA MONTESDEOCA MRN: [id number] Date of Birth: [birth year]  Transition of Care [facility]) CM/SW Contact:  Leone Haven, RN Phone Number: 10/05/2023, 2:08 PM   Clinical Narrative:    For dc today, has no needs. Daughter is at bedside to transport home.     Barriers to Discharge: Continued Medical Work up   Patient Goals and CMS Choice Patient states their goals for this hospitalization and ongoing recovery are:: return home   Choice offered to / list presented to : NA      Discharge Placement                       Discharge Plan and Services Additional resources added to the After Visit Summary for   In-house Referral: NA Discharge Planning Services: CM Consult Post Acute Care Choice: NA          DME Arranged: N/A DME Agency: NA       HH Arranged: NA          Social Drivers of Health (SDOH) Interventions SDOH Screenings   Food Insecurity: No Food Insecurity (09/30/2023)  Housing: Low Risk  (09/30/2023)  Transportation Needs: No Transportation Needs (09/30/2023)  Utilities: Not At Risk (09/30/2023)  Social Connections: Moderately Isolated (09/30/2023)  Tobacco Use: Low Risk  (09/30/2023)     Readmission Risk Interventions    06/21/2023   10:50 AM  Readmission Risk Prevention Plan  Post Dischage Appt Complete  Medication Screening Complete  Transportation Screening Complete

## 2023-10-05 NOTE — Progress Notes (Addendum)
Rounding Note    Patient Name: Hannah Downs Date of Encounter: 10/05/2023  [facility] Cardiologist: Christell Constant, MD   Subjective   No CP, SOB or palpitations.  VERY anxious to go home  Inpatient Medications    Scheduled Meds:  atorvastatin  20 mg Oral Daily   cholecalciferol  2,000 Units Oral q AM   furosemide  20 mg Oral Daily   metoprolol tartrate  50 mg Oral BID   pantoprazole  40 mg Oral Daily   sodium chloride flush  3 mL Intravenous Q12H   trospium  20 mg Oral BID   warfarin  2.5 mg Oral q1600   Warfarin - Pharmacist Dosing Inpatient   Does not apply q1600   Continuous Infusions:  PRN Meds: acetaminophen, sodium chloride flush   Vital Signs    Vitals:   10/05/23 0432 10/05/23 0745 10/05/23 0747 10/05/23 0810  BP: (!) 131/102 (!) 140/109 (!) 137/100 123/86  Pulse: 87 100 100 (!) 105  Resp: 18 18  16   Temp: 97.6 F (36.4 C)     TempSrc: Oral     SpO2: 98% 96% 98%   Weight:      Height:        Intake/Output Summary (Last 24 hours) at 10/05/2023 0934 Last data filed at 10/05/2023 0805 Gross per 24 hour  Intake 280 ml  Output --  Net 280 ml      10/05/2023   12:19 AM 10/04/2023    5:10 AM 10/03/2023    3:59 AM  Last 3 Weights  Weight (lbs) 121 lb 1.6 oz 119 lb 14.4 oz 121 lb 4.8 oz  Weight (kg) 54.931 kg 54.386 kg 55.021 kg      Telemetry    Atrial fibrillation with CVR - Personally Reviewed  ECG    No new EKG to review - Personally Reviewed  Physical Exam   GEN: Well nourished, well developed in no acute distress HEENT: Normal NECK: No JVD; No carotid bruits LYMPHATICS: No lymphadenopathy CARDIAC:irregularly irregular, no murmurs, rubs, gallops RESPIRATORY:  Clear to auscultation without rales, wheezing or rhonchi  ABDOMEN: Soft, non-tender, non-distended MUSCULOSKELETAL:  No edema; No deformity  SKIN: Warm and dry NEUROLOGIC:  Alert and oriented x 3 PSYCHIATRIC:  Normal affect  Labs    High Sensitivity  Troponin:   Recent Labs  Lab 09/30/23 1318 09/30/23 1719  TROPONINIHS 9 9     Chemistry Recent Labs  Lab 10/01/23 0233 10/02/23 0246 10/03/23 0255  NA 132* 136 135  K 3.7 3.1* 4.0  CL 100 103 100  CO2 23 27 26   GLUCOSE 103* 98 104*  BUN 16 7* 8  CREATININE 0.90 0.68 0.75  CALCIUM 9.1 8.8* 9.1  PROT 6.2*  --   --   ALBUMIN 3.4*  --   --   AST 43*  --   --   ALT 33  --   --   ALKPHOS 66  --   --   BILITOT 1.8*  --   --   GFRNONAA >60 >60 >60  ANIONGAP 9 6 9     Lipids No results for input(s): "CHOL", "TRIG", "HDL", "LABVLDL", "LDLCALC", "CHOLHDL" in the last 168 hours.  Hematology Recent Labs  Lab 10/03/23 0255 10/04/23 0250 10/05/23 0232  WBC 7.6 7.4 6.5  RBC 4.20 4.13 4.12  HGB 12.9 12.6 12.5  HCT 37.9 37.3 37.2  MCV 90.2 90.3 90.3  MCH 30.7 30.5 30.3  MCHC 34.0 33.8 33.6  RDW 13.8 13.8 13.7  PLT 259 260 215   Thyroid  Recent Labs  Lab 09/30/23 1719  TSH 4.856*    BNP Recent Labs  Lab 09/30/23 1316  BNP 606.7*    DDimer No results for input(s): "DDIMER" in the last 168 hours.   Radiology    No results found.  Cardiac Studies   Echo 06/23/2023  1. Left ventricular ejection fraction, by estimation, is 60 to 65%. The  left ventricle has normal function. The left ventricle has no regional  wall motion abnormalities. Left ventricular diastolic parameters were  normal.   2. Right ventricular systolic function is normal. The right ventricular  size is normal. There is moderately elevated pulmonary artery systolic  pressure. The estimated right ventricular systolic pressure is 45.0 mmHg.   3. Left atrial size was upper limit of normal.   4. Right atrial size was dilated.   5. The mitral valve is normal in structure. Moderate mitral valve  regurgitation. No evidence of mitral stenosis.   6. Tricuspid valve regurgitation is moderate to severe.   7. The aortic valve is normal in structure. Aortic valve regurgitation is  not visualized. No aortic  stenosis is present.   8. The inferior vena cava is dilated in size with <50% respiratory  variability, suggesting right atrial pressure of 15 mmHg.   Comparison(s): A prior study was performed on 11/08/2021. Grade 2  diastolic dysfunction is now normal. Mitral / tricuspid valve  regurgitation and estimated RAP of are new findings compared to  prior study.   Patient Profile     78 y.o. female with PMH of persistent atrial fibrillation, HFpEF, moderate MR, HTN, HLD, GERD and h/o CVA who recently underwent outpatient DCCV on 09/29/2023. Post cardioversion EKG showed junctional rhythm with HR 53 bpm. Patient instructed to decrease metoprolol by half to 50mg  BID. Cardizem and amiodarone continued. She returned on 1/17 with dizziness and feeling of passing out in the setting of junctional bradycardia. Metoprolol and cardizem discontinued.     Assessment & Plan    Junctional bradycardia - first noted after recent cardioversion, became symptomatic after going home - readmitted with symptomatic junctional brady and BB stopped - developed recurrent afib during this admission  PAF s/p cardioversion on 09/30/2023: on amiodarone - recurrent afib during this admission despite Amio load - cardizem and metoprolol initially stopped, low dose metoprolol restarted  to help with rate control in addition to amiodarone - appreciated EP consult for tachybrady syndrome>>recommended stopping Amio and pursing rate control with Lopressor since she has no cardiac awareness of her afib -HR fairly well controlled -continue Lopressor 50mg  BID -continue warfarin>>INR 2.2 -I will get a 3 day ziopatch to assess HR control outpt  Hyponatremia:  -resolved>>Na 135 today  Hypertension -DBP borderline controlled -PTA Cardizem stopped and Lopressor initially decreased from 100mg  BID to 25mg  BID -increased Lopressor to 50mg  BID yesterday for HR and BP control a -continue Lopressor 50mg  BID for now and may need to  increase as outpt   HFpEF -appears euvolemic on exam today -Continue Lasix 20mg  daily and Lopressor 50mg  BID -would avoid SGLT2i due to increased risk of UTI  She is stable for discharge.  She wants to followup with Dr. Izora Ribas so will try to get in with him or extender in 7-10 days.  Will need 3 day outpt Ziopatch to assess adequacy of HR control current meds  Discharge meds to include: Lopressor 50mg  BID Warfarin per pharmacy Lasix 20mg  daily Atorvastatin  20mg  daily Protonix 40mg  daily Trospium 20mg  BID Vit D3 2000IU  daily Estradiol vaginal cream Klor-con daily -recommend BMET in 1 week   I spent 30 minutes caring for this patient today face to face, ordering and reviewing labs, reviewing records from 2D echo, hospital notes , seeing the patient, documenting in the record and preparing discharge  For questions or updates, please contact [facility] Please consult [email or web address] for contact info under        Signed, Armanda Magic, MD  10/05/2023, 9:34 AM

## 2023-10-05 NOTE — Progress Notes (Unsigned)
Enrolled for [employer] to mail a ZIO XT long term holter monitor to the patients address on file.   Dr. Chandrasekhar to read. 

## 2023-10-05 NOTE — Progress Notes (Signed)
Mobility Specialist Progress Note:   10/05/23 1125  Mobility  Activity Ambulated independently in hallway  Level of Assistance Modified independent, requires aide device or extra time  Assistive Device Front wheel walker  Distance Ambulated (ft) 400 ft  Activity Response Tolerated well  Mobility Referral Yes  Mobility visit 1 Mobility  Mobility Specialist Start Time (ACUTE ONLY) 1125  Mobility Specialist Stop Time (ACUTE ONLY) 1140  Mobility Specialist Time Calculation (min) (ACUTE ONLY) 15 min   Pt agreeable to mobility session. Required no physical assistance throughout ambulation with RW in hallway. HR 101-116bpm. No c/o throughout, pt eager to go home.  Addison Lank Mobility Specialist Please contact via SecureChat or  Rehab office at [phone]

## 2023-10-05 NOTE — Discharge Summary (Signed)
Discharge Summary    Patient ID: Hannah Downs MRN: [id number]; DOB: 1946-08-08  Admit date: 09/30/2023 Discharge date: 10/05/2023  PCP:  [name], MD    [facility] Providers Cardiologist:  Christell Constant, MD  Cardiology APP:  Kennon Rounds  }     Discharge Diagnoses    Principal Problem:   Junctional bradycardia Active Problems:   Primary hypertension   Symptomatic bradycardia    Diagnostic Studies/Procedures   _____________   History of Present Illness     Hannah Downs is a 78 y.o. female with persistent atrial fibrillation, chronic HFpEF, moderate MR, moderate to severe tricuspid regurgitation, hypertension, hyperlipidemia, aortic atherosclerosis, GERD, history of CVA.  Patient is followed by Dr. Elease Hashimoto with plans to transition care to Dr. Izora Ribas  given Dr. Harvie Bridge impending retirement (Dr. Izora Ribas saw her in 06/2023 and also cares for her daughter). She had an acute CVA after Eliquis was held for an emergent abdominal surgery in 10/2021. Echo at that time reassuring. She was restarted on Eliquis but presented with another CVA in 11/2021. She was switched to Coumadin at that time with plans for Lovenox bridge for any additional surgical procedures in the future. She developed recurrent AF this year and underwent DCCV 07/2023 but did not hold sinus so was started on amiodarone with plan to re-trial DCCV. OP PFTs are pending as well. TSH 06/2023 was wnl. F/u echo 06/2023 EF 60-65%, moderately elevated PASP, ULN LA size, moderate MR.   She was seen in the office 09/23/23 and was still in atrial fib despite amiodarone. She was felt to be adequately loaded with amio so dose was reduced to 200mg  daily. She was continued on metoprolol 100mg  BID and diltiazem 120mg  daily and sent for DCCV which was done 1/16. Post-DCCV EKG showed junctional rhythm 53bpm. Patient reports they were verbally told to decrease metoprolol to 1/2 tablet  twice a day (50mg  BID) at discharge. She and her family called the office later that evening regarding low HR 48bpm and BP 84/55. She was instructed to hold diltiazem and metoprolol then restart 50mg  BID in the morning and to continue amiodarone.    On 1/17, she took the metoprolol and amiodarone and excluded the diltiazem as instructed. However, she continued to feel like she might pass out as well as some SOB and generalized chest tightness with exertion. No overt chest pain. She has also noticed some dry mouth, increased thirst, and generalized fatigue. She has had poor appetite for some time now. Labs show new hyponatremia of 128, Cr 1.26, BNP 606, hsTroponin neg x1, CBC wnl, INR 2.4. She took her Lasix, potassium, atorvastatin, Sanctura (for bladder) this AM. She takes her Coumadin QPM and is due for dose. CXR shows trace residual right pleural effusion versus pleural thickening, stable enlarged cardiac silhouette.  Patient was admitted to the cardiology service for further evaluation and care.   Hospital Course     Consultants: EP    Patient was admitted on 1/17 for treatment of junctional bradycardia.  Her metoprolol and Cardizem were discontinued, but she remained on amiodarone.  She also remained on Coumadin, pharmacy assisting with Coumadin dosing to maintain INR 2-3.  Patient was hyponatremic on arrival, Lasix briefly held.  By 1/18, patient remained in junctional rhythm but her symptoms had were proved.  Remained on amiodarone and Coumadin.  Her hyponatremia had resolved so she was started back on Lasix 20 mg daily.  Regards to her chest pressure, electrocardiogram  showed no ST changes and troponins were normal.  Suspected that symptoms were due to junctional bradycardia.  On 1/19, patient developed recurrent atrial fibrillation with RVR.  Patient reported that she would like to avoid repeat cardioversion but would potentially consider in the future.  Remained on p.o. amiodarone.  Also added  metoprolol 25 mg daily for improved heart rate control.  Heart rate remained elevated, EP was consulted on 1/20.  EP recommended stopping amiodarone and uptitrating beta-blocker to achieve adequate rate control.  Metoprolol was increased to 50 mg twice daily.   Patient was seen by Dr. Mayford Knife on 1/22.  Determined that heart rate was overall well-controlled.  Recommended 3-day Zio patch to assess heart rate control as an outpatient.  Patient was deemed stable for discharge  Persistent atrial fibrillation Tachybrady syndrome -Patient underwent cardioversion on 1/16 with conversion to junctional bradycardia.  Was admitted on 1/17 with symptomatic junctional bradycardia.  While admitted, she had recurrence of atrial fibrillation with RVR on 1/19 -EP was consulted.  Patient reported clear desire for rate control.  Was not interested at pacemaker for now  - amiodarone and diltiazem stopped this admission  -Continue Lopressor 50 mg twice daily. HR well controlled prior to DC  -Continue Coumadin.  INR 2.2 at discharge. Discussed with pharmacy who recommended discharging her on 2.5 mg daily.  Patient has Coumadin clinic appointment on 1/27 -Ordered 30-day Zio patch to assess heart rate control as an outpatient and messaged office to arrange   Junctional Bradycardia  -Patient had cardioversion on 1/16, EKG post cardioversion showed junctional rhythm.  She was discharged after cardioversion on 1/16, became symptomatic after going home. Diltiazem and metoprolol were discontinued but amiodarone continued - Patient developed afib with RVR while admitted.  Now back on beta-blocker, off diltiazem and Amio -Patient seen by EP this admission for tachybrady  Hyponatremia -Na was 128 on admission on 1/17.  Improved to 135 prior to discharge - Recommend BMP at follow up appointment   Hypertension -Continue metoprolol tartrate 50 mg twice daily, Lasix 20 mg daily  HFpEF Moderate MR Moderate-severe TR -Most  recent echo from 06/2023 showed EF 60-65%, no regional wall motion abnormalities, normal RV function, moderate MR, moderate-severe TR -Continue Lasix 20 mg daily  Patient has appointment with the coumadin clinic on 1/27. Has appointment with general cardiology on 2/4.  Did the patient have an acute coronary syndrome (MI, NSTEMI, STEMI, etc) this admission?:  No                               Did the patient have a percutaneous coronary intervention (stent / angioplasty)?:  No.     _____________  Discharge Vitals Blood pressure (!) 133/107, pulse 91, temperature 97.7 F (36.5 C), temperature source Oral, resp. rate 18, height 4\' 11"  (1.499 m), weight 54.9 kg, SpO2 99%.  Filed Weights   10/03/23 0359 10/04/23 0510 10/05/23 0019  Weight: 55 kg 54.4 kg 54.9 kg    Labs & Radiologic Studies    CBC Recent Labs    10/04/23 0250 10/05/23 0232  WBC 7.4 6.5  HGB 12.6 12.5  HCT 37.3 37.2  MCV 90.3 90.3  PLT 260 215   Basic Metabolic Panel Recent Labs    40/98/11 0255  NA 135  K 4.0  CL 100  CO2 26  GLUCOSE 104*  BUN 8  CREATININE 0.75  CALCIUM 9.1   Liver Function Tests No results  for input(s): "AST", "ALT", "ALKPHOS", "BILITOT", "PROT", "ALBUMIN" in the last 72 hours. No results for input(s): "LIPASE", "AMYLASE" in the last 72 hours. High Sensitivity Troponin:   Recent Labs  Lab 09/30/23 1318 09/30/23 1719  TROPONINIHS 9 9    BNP Invalid input(s): "POCBNP" D-Dimer No results for input(s): "DDIMER" in the last 72 hours. Hemoglobin A1C No results for input(s): "HGBA1C" in the last 72 hours. Fasting Lipid Panel No results for input(s): "CHOL", "HDL", "LDLCALC", "TRIG", "CHOLHDL", "LDLDIRECT" in the last 72 hours. Thyroid Function Tests No results for input(s): "TSH", "T4TOTAL", "T3FREE", "THYROIDAB" in the last 72 hours.  Invalid input(s): "FREET3" _____________  DG Chest 2 View Result Date: 09/30/2023 CLINICAL DATA:  Short of breath, chest pressure, history of  atrial fibrillation with cardioversion yesterday EXAM: CHEST - 2 VIEW COMPARISON:  06/23/2023 FINDINGS: Frontal and lateral views of the chest demonstrates stable enlargement of the cardiac silhouette. Minimal residual blunting of the right costophrenic angle may reflect pleural thickening or trace effusion. No airspace disease or pneumothorax. No acute bony abnormalities. IMPRESSION: 1. Trace residual right pleural effusion versus pleural thickening. 2. Stable enlarged cardiac silhouette. Electronically Signed   By: Sharlet Salina M.D.   On: 09/30/2023 14:59   EP STUDY Result Date: 09/30/2023 See surgical note for result.  Disposition   Pt is being discharged home today in good condition.  Follow-up Plans & Appointments     Discharge Instructions     Diet - low sodium heart healthy   Complete by: As directed    Increase activity slowly   Complete by: As directed         Discharge Medications   Allergies as of 10/05/2023       Reactions   Thiazide-type Diuretics Other (See Comments)   Hyponatremia    Zestril [lisinopril] Cough        Medication List     STOP taking these medications    amiodarone 200 MG tablet Commonly known as: PACERONE   diltiazem 120 MG 24 hr capsule Commonly known as: CARDIZEM CD       TAKE these medications    acetaminophen 500 MG tablet Commonly known as: TYLENOL Take 1,000 mg by mouth at bedtime.   atorvastatin 20 MG tablet Commonly known as: LIPITOR Take 1 tablet by mouth once daily   estradiol 0.1 MG/GM vaginal cream Commonly known as: ESTRACE PLACE 0.5 GRAMS VAGINALLY TWICE A WEEK   furosemide 20 MG tablet Commonly known as: LASIX Take 1 tablet (20 mg total) by mouth daily. Start taking on: October 06, 2023 What changed:  medication strength how much to take   metoprolol tartrate 50 MG tablet Commonly known as: LOPRESSOR Take 1 tablet (50 mg total) by mouth 2 (two) times daily. What changed:  medication  strength how much to take   omeprazole 20 MG tablet Commonly known as: PRILOSEC OTC Take 20 mg by mouth daily before breakfast.   potassium chloride 10 MEQ tablet Commonly known as: KLOR-CON Take 1 tablet by mouth once daily   trospium 20 MG tablet Commonly known as: SANCTURA Take 1 tablet (20 mg total) by mouth 2 (two) times daily.   Vitamin D3 50 MCG (2000 UT) Tabs Take 2,000 Units by mouth in the morning.   warfarin 5 MG tablet Commonly known as: COUMADIN Take as directed. If you are unsure how to take this medication, talk to your nurse or doctor. Original instructions: Take 0.5 tablets (2.5 mg total) by mouth daily. What changed:  See the new instructions.           Outstanding Labs/Studies    Duration of Discharge Encounter: APP Time: 25 minutes   Signed, Jonita Albee, PA-C 10/05/2023, 2:05 PM

## 2023-10-10 ENCOUNTER — Ambulatory Visit: Payer: Medicare Other | Attending: Cardiovascular Disease

## 2023-10-10 DIAGNOSIS — Z5181 Encounter for therapeutic drug level monitoring: Secondary | ICD-10-CM | POA: Insufficient documentation

## 2023-10-10 DIAGNOSIS — I4891 Unspecified atrial fibrillation: Secondary | ICD-10-CM | POA: Diagnosis not present

## 2023-10-10 LAB — POCT INR: INR: 2.3 (ref 2.0–3.0)

## 2023-10-10 NOTE — Patient Instructions (Signed)
Description   Continue taking warfarin 0.5 tablet daily. Stay consistent with greens (2x per week)  Recheck INR in 10 days at coumadin clinic  Coumadin Clinic [phone]

## 2023-10-11 DIAGNOSIS — I480 Paroxysmal atrial fibrillation: Secondary | ICD-10-CM | POA: Diagnosis not present

## 2023-10-11 DIAGNOSIS — I4891 Unspecified atrial fibrillation: Secondary | ICD-10-CM | POA: Diagnosis not present

## 2023-10-11 DIAGNOSIS — R001 Bradycardia, unspecified: Secondary | ICD-10-CM

## 2023-10-17 NOTE — Progress Notes (Signed)
 Cardiology Office Note:    Date:  10/18/2023  ID:  Hannah Downs, DOB March 05, 1946, MRN [id number] PCP: Teresa Channel, MD  [facility] Providers Cardiologist:  Stanly DELENA Leavens, MD Cardiology APP:  Lelon Glendia DASEN, PA-C       Patient Profile:      Persistent atrial fibrillation Echo 7/21: EF 55-60, Gr 2 DD, RVSP 38.3, mild BAE Monitor 03/2020: <1% A-fib burden Echo 11/08/2021: EF 60-65, Gr 2 DD, normal RVSF, mild BAE, trivial MR TTE 06/23/2023: EF 60-65, no RWMA, normal RVSF, moderate pulmonary hypertension, RVSP 45, RAE, moderate MR, moderate to severe TR, RAP 15 (HFpEF) heart failure with preserved ejection fraction  Pt w vol overload during admit for gyn surgery in 06/2023  Mitral regurgitation Moderate on TTE in 06/2023 Hypertension Hyperlipidemia Aortic Atherosclerosis GERD Hx of CVA (Feb 2023, March 2023) S/p urgent hernia repair Feb 2023 (Incarcerated L Fem) C/b AF w RVR and periop stroke         Hannah Downs is a 78 y.o. female who returns for post hospital follow up.  She was last seen 09/23/2023 and set up for cardioversion after adequate loaded with amiodarone .  Cardioversion was performed on 09/29/2023 with restoration of NSR.  She was then admitted 1/17-1/22 with symptomatic junctional bradycardia.  Her metoprolol  and diltiazem  were stopped and she developed recurrent atrial fibrillation.  She was seen by EP for evaluation of tachybradycardia syndrome.  She wanted to avoid pacemaker implantation.  Amiodarone  was stopped and rate control was recommended.  Discussed the use of AI scribe software for clinical note transcription with the patient, who gave verbal consent to proceed.  History of Present Illness   She is here with her daughter. Her pulse rate at home has been between 90 and 95 bpm, with blood pressure readings around 90/60 mmHg. No dizziness or syncope. She finished wearing the heart monitor, which was returned recently. She has not had  chest pain, pressure, or tightness. There is no significant swelling, which she attributes to wearing compression hose and taking Lasix . She occasionally experiences mild dyspnea on exertion, such as walking down the hallway, but notes it is much improved compared to previous episodes. No issues with orthopnea or paroxysmal nocturnal dyspnea.      ROS-See HPI     Studies Reviewed:   EKG Interpretation Date/Time:  Tuesday October 18 2023 08:38:32 EST Ventricular Rate:  103 PR Interval:    QRS Duration:  92 QT Interval:  354 QTC Calculation: 463 R Axis:   97  Text Interpretation: Atrial fibrillation with rapid ventricular response Rightward axis Confirmed by Lelon Glendia [phone]) on 10/18/2023 8:45:38 AM          Risk Assessment/Calculations:    CHA2DS2-VASc Score = 7   This indicates a 11.2% annual risk of stroke. The patient's score is based upon: CHF History: 0 HTN History: 1 Diabetes History: 0 Stroke History: 2 Vascular Disease History: 1 Age Score: 2 Gender Score: 1    HYPERTENSION CONTROL Vitals:   10/18/23 0833 10/18/23 0907  BP: (!) 140/80 (!) 142/96    The patient's blood pressure is elevated above target today.  In order to address the patient's elevated BP:           Physical Exam:   VS:  BP (!) 142/96   Pulse (!) 103   Ht 5' (1.524 m)   Wt 119 lb 12.8 oz (54.3 kg)   SpO2 97%   BMI 23.40 kg/m  Wt Readings from Last 3 Encounters:  10/18/23 119 lb 12.8 oz (54.3 kg)  10/05/23 121 lb 1.6 oz (54.9 kg)  09/29/23 123 lb 6.4 oz (56 kg)    Constitutional:      Appearance: Healthy appearance. Not in distress.  Neck:     Vascular: JVD normal.  Pulmonary:     Breath sounds: Normal breath sounds. No wheezing. No rales.  Cardiovascular:     Normal rate. Irregularly irregular rhythm.     Murmurs: There is no murmur.  Edema:    Peripheral edema absent.  Abdominal:     Palpations: Abdomen is soft.        Assessment and Plan:   Assessment &  Plan Persistent atrial fibrillation (HCC) Rate control strategy.  As noted, she developed junctional bradycardia and sinus rhythm on amiodarone , diltiazem  and metoprolol .  She wanted to avoid pacemaker implantation.  She remains in atrial fibrillation.  Heart rate today is 103.  She just completed her 3-day monitor to assess heart rate control.  Blood pressure is uncontrolled. -Increase metoprolol  to tartrate to 100 mg twice daily -Continue Coumadin  as directed -Follow-up 3 months Chronic heart failure with preserved ejection fraction (HCC) NYHA II-IIb.  Volume status stable. -Continue furosemide  20 mg daily, potassium 10 mEq daily -Consider spironolactone if blood pressure remains uncontrolled or volume difficult to manage -Could consider SGLT2 inhibitor if volume difficult to manage Nonrheumatic mitral valve regurgitation Moderate MR on echo in October 2024.  Continue annual surveillance. Essential hypertension Blood pressure uncontrolled.  She notes lower blood pressure readings at home. -Increase metoprolol  tartrate to 100 mg twice daily, which she had previously tolerated -I have asked her to bring her BP machine to her Coumadin  clinic visit to compare with a Hg cuff.      Dispo:  Return in about 3 months (around 01/15/2024) for Routine Follow Up, w/ Dr. Santo, or Glendia Ferrier, PA-C.  Signed, Glendia Ferrier, PA-C

## 2023-10-18 ENCOUNTER — Encounter: Payer: Self-pay | Admitting: Physician Assistant

## 2023-10-18 ENCOUNTER — Ambulatory Visit: Payer: Medicare Other | Attending: Physician Assistant | Admitting: Physician Assistant

## 2023-10-18 VITALS — BP 142/96 | HR 103 | Ht 60.0 in | Wt 119.8 lb

## 2023-10-18 DIAGNOSIS — I5032 Chronic diastolic (congestive) heart failure: Secondary | ICD-10-CM | POA: Diagnosis not present

## 2023-10-18 DIAGNOSIS — I10 Essential (primary) hypertension: Secondary | ICD-10-CM

## 2023-10-18 DIAGNOSIS — I340 Nonrheumatic mitral (valve) insufficiency: Secondary | ICD-10-CM

## 2023-10-18 DIAGNOSIS — I4819 Other persistent atrial fibrillation: Secondary | ICD-10-CM | POA: Diagnosis not present

## 2023-10-18 MED ORDER — METOPROLOL TARTRATE 100 MG PO TABS: 100.0000 mg | ORAL_TABLET | Freq: Two times a day (BID) | ORAL | 3 refills | Status: DC

## 2023-10-18 NOTE — Assessment & Plan Note (Signed)
NYHA II-IIb.  Volume status stable. -Continue furosemide 20 mg daily, potassium 10 mEq daily -Consider spironolactone if blood pressure remains uncontrolled or volume difficult to manage -Could consider SGLT2 inhibitor if volume difficult to manage

## 2023-10-18 NOTE — Patient Instructions (Signed)
 Medication Instructions:  Your physician has recommended you make the following change in your medication:   INCREASE the Metoprolol  to 100 mg taking  1 twice a day  Bring your machine with you to the next appointment  *If you need a refill on your cardiac medications before your next appointment, please call your pharmacy*   Lab Work: None ordered  If you have labs (blood work) drawn today and your tests are completely normal, you will receive your results only by: MyChart Message (if you have MyChart) OR A paper copy in the mail If you have any lab test that is abnormal or we need to change your treatment, we will call you to review the results.   Testing/Procedures: None ordered   Follow-Up: At [facility], you and your health needs are our priority.  As part of our continuing mission to provide you with exceptional heart care, we have created designated Provider Care Teams.  These Care Teams include your primary Cardiologist (physician) and Advanced Practice Providers (APPs -  Physician Assistants and Nurse Practitioners) who all work together to provide you with the care you need, when you need it.  We recommend signing up for the patient portal called MyChart.  Sign up information is provided on this After Visit Summary.  MyChart is used to connect with patients for Virtual Visits (Telemedicine).  Patients are able to view lab/test results, encounter notes, upcoming appointments, etc.  Non-urgent messages can be sent to your provider as well.   To learn more about what you can do with MyChart, go to [email or web address].    Your next appointment:   3 month(s)  Provider:   Stanly DELENA Leavens, MD     Other Instructions   1st Floor: - Lobby - Registration  - Pharmacy  - Lab - Cafe  2nd Floor: - PV Lab - Diagnostic Testing (echo, CT, nuclear med)  3rd Floor: - Vacant  4th Floor: - TCTS (cardiothoracic surgery) - AFib Clinic - Structural Heart  Clinic - Vascular Surgery  - Vascular Ultrasound  5th Floor: - HeartCare Cardiology (general and EP) - Clinical Pharmacy for coumadin , hypertension, lipid, weight-loss medications, and med management appointments    Valet parking services will be available as well.

## 2023-10-18 NOTE — Assessment & Plan Note (Signed)
Moderate MR on echo in October 2024.  Continue annual surveillance.

## 2023-10-18 NOTE — Assessment & Plan Note (Signed)
 Rate control strategy.  As noted, she developed junctional bradycardia and sinus rhythm on amiodarone , diltiazem  and metoprolol .  She wanted to avoid pacemaker implantation.  She remains in atrial fibrillation.  Heart rate today is 103.  She just completed her 3-day monitor to assess heart rate control.  Blood pressure is uncontrolled. -Increase metoprolol  to tartrate to 100 mg twice daily -Continue Coumadin  as directed -Follow-up 3 months

## 2023-10-19 DIAGNOSIS — R001 Bradycardia, unspecified: Secondary | ICD-10-CM | POA: Diagnosis not present

## 2023-10-19 DIAGNOSIS — I480 Paroxysmal atrial fibrillation: Secondary | ICD-10-CM | POA: Diagnosis not present

## 2023-10-20 ENCOUNTER — Ambulatory Visit: Payer: Medicare Other | Attending: Cardiovascular Disease | Admitting: *Deleted

## 2023-10-20 DIAGNOSIS — I4891 Unspecified atrial fibrillation: Secondary | ICD-10-CM | POA: Diagnosis not present

## 2023-10-20 DIAGNOSIS — Z5181 Encounter for therapeutic drug level monitoring: Secondary | ICD-10-CM | POA: Diagnosis not present

## 2023-10-20 LAB — POCT INR: POC INR: 2.3

## 2023-10-20 NOTE — Patient Instructions (Addendum)
  Description   Continue taking warfarin 0.5 tablet daily. Stay consistent with greens (2x per week)  Coumadin  Clinic [phone]

## 2023-10-21 DIAGNOSIS — I4891 Unspecified atrial fibrillation: Secondary | ICD-10-CM | POA: Diagnosis not present

## 2023-10-21 DIAGNOSIS — H1033 Unspecified acute conjunctivitis, bilateral: Secondary | ICD-10-CM | POA: Diagnosis not present

## 2023-10-21 DIAGNOSIS — I10 Essential (primary) hypertension: Secondary | ICD-10-CM | POA: Diagnosis not present

## 2023-10-21 DIAGNOSIS — I480 Paroxysmal atrial fibrillation: Secondary | ICD-10-CM | POA: Diagnosis not present

## 2023-10-21 DIAGNOSIS — E871 Hypo-osmolality and hyponatremia: Secondary | ICD-10-CM | POA: Diagnosis not present

## 2023-10-21 DIAGNOSIS — R001 Bradycardia, unspecified: Secondary | ICD-10-CM | POA: Diagnosis not present

## 2023-10-24 ENCOUNTER — Telehealth: Payer: Self-pay

## 2023-10-24 NOTE — Telephone Encounter (Signed)
-----   Message from Debria Fang sent at 10/24/2023  6:52 AM EST ----- Please tell patient that her recent cardiac monitor showed that she is always in atrial fibrillation. Overall, HR is well controlled. She should continue metoprolol  tartrate 100 mg BID for HR and BP control (as discussed at her appointment with Geralyn Knee on 2/4)   Thanks KJ

## 2023-10-24 NOTE — Telephone Encounter (Signed)
 Called patient advised of below they verbalized understanding.

## 2023-11-03 ENCOUNTER — Ambulatory Visit: Payer: Medicare Other

## 2023-11-10 ENCOUNTER — Ambulatory Visit: Payer: Medicare Other | Attending: Cardiology

## 2023-11-10 DIAGNOSIS — I4891 Unspecified atrial fibrillation: Secondary | ICD-10-CM | POA: Diagnosis not present

## 2023-11-10 DIAGNOSIS — Z5181 Encounter for therapeutic drug level monitoring: Secondary | ICD-10-CM | POA: Diagnosis not present

## 2023-11-10 LAB — POCT INR: INR: 2.4 (ref 2.0–3.0)

## 2023-11-10 NOTE — Patient Instructions (Signed)
 Description   Continue taking warfarin 0.5 tablet daily. Stay consistent with greens (2x per week)  Recheck INR in 3 weeks.  Coumadin Clinic [phone]

## 2023-11-15 ENCOUNTER — Other Ambulatory Visit: Payer: Self-pay | Admitting: Cardiovascular Disease

## 2023-12-01 ENCOUNTER — Ambulatory Visit: Payer: Medicare Other | Attending: Cardiovascular Disease

## 2023-12-01 DIAGNOSIS — I4891 Unspecified atrial fibrillation: Secondary | ICD-10-CM

## 2023-12-01 DIAGNOSIS — I639 Cerebral infarction, unspecified: Secondary | ICD-10-CM | POA: Diagnosis not present

## 2023-12-01 DIAGNOSIS — Z5181 Encounter for therapeutic drug level monitoring: Secondary | ICD-10-CM | POA: Diagnosis not present

## 2023-12-01 DIAGNOSIS — I480 Paroxysmal atrial fibrillation: Secondary | ICD-10-CM | POA: Diagnosis not present

## 2023-12-01 LAB — POCT INR: INR: 1.7 — AB (ref 2.0–3.0)

## 2023-12-01 NOTE — Patient Instructions (Signed)
 Description   Start taking Warfarin 0.5 tablet daily except 1 tablet on Thursdays. Stay consistent with greens (2x per week)  Recheck INR in 3 weeks.  Coumadin Clinic [phone]

## 2023-12-21 ENCOUNTER — Telehealth: Payer: Self-pay | Admitting: *Deleted

## 2023-12-21 NOTE — Telephone Encounter (Signed)
 Pt left voicemail to get her reschedule due to stomach bug. She requested appt for next week, which was set up.

## 2023-12-22 ENCOUNTER — Ambulatory Visit

## 2023-12-28 ENCOUNTER — Ambulatory Visit: Attending: Cardiovascular Disease

## 2023-12-28 DIAGNOSIS — Z5181 Encounter for therapeutic drug level monitoring: Secondary | ICD-10-CM | POA: Diagnosis not present

## 2023-12-28 DIAGNOSIS — I4891 Unspecified atrial fibrillation: Secondary | ICD-10-CM | POA: Diagnosis not present

## 2023-12-28 DIAGNOSIS — I480 Paroxysmal atrial fibrillation: Secondary | ICD-10-CM | POA: Diagnosis not present

## 2023-12-28 LAB — POCT INR: INR: 2.7 (ref 2.0–3.0)

## 2023-12-28 NOTE — Patient Instructions (Signed)
 Description   Continue taking Warfarin 0.5 tablet daily except 1 tablet on Thursdays. Stay consistent with greens (2x per week)  Recheck INR in 4 weeks.  Coumadin Clinic [phone]

## 2024-01-09 DIAGNOSIS — I480 Paroxysmal atrial fibrillation: Secondary | ICD-10-CM | POA: Diagnosis not present

## 2024-01-09 DIAGNOSIS — R7301 Impaired fasting glucose: Secondary | ICD-10-CM | POA: Diagnosis not present

## 2024-01-09 DIAGNOSIS — K219 Gastro-esophageal reflux disease without esophagitis: Secondary | ICD-10-CM | POA: Diagnosis not present

## 2024-01-09 DIAGNOSIS — Z1331 Encounter for screening for depression: Secondary | ICD-10-CM | POA: Diagnosis not present

## 2024-01-09 DIAGNOSIS — E785 Hyperlipidemia, unspecified: Secondary | ICD-10-CM | POA: Diagnosis not present

## 2024-01-09 DIAGNOSIS — Z8673 Personal history of transient ischemic attack (TIA), and cerebral infarction without residual deficits: Secondary | ICD-10-CM | POA: Diagnosis not present

## 2024-01-09 DIAGNOSIS — Z79899 Other long term (current) drug therapy: Secondary | ICD-10-CM | POA: Diagnosis not present

## 2024-01-09 DIAGNOSIS — Z23 Encounter for immunization: Secondary | ICD-10-CM | POA: Diagnosis not present

## 2024-01-09 DIAGNOSIS — M8588 Other specified disorders of bone density and structure, other site: Secondary | ICD-10-CM | POA: Diagnosis not present

## 2024-01-09 DIAGNOSIS — I5032 Chronic diastolic (congestive) heart failure: Secondary | ICD-10-CM | POA: Diagnosis not present

## 2024-01-09 DIAGNOSIS — E559 Vitamin D deficiency, unspecified: Secondary | ICD-10-CM | POA: Diagnosis not present

## 2024-01-09 DIAGNOSIS — Z1159 Encounter for screening for other viral diseases: Secondary | ICD-10-CM | POA: Diagnosis not present

## 2024-01-09 DIAGNOSIS — I2721 Secondary pulmonary arterial hypertension: Secondary | ICD-10-CM | POA: Diagnosis not present

## 2024-01-09 DIAGNOSIS — I10 Essential (primary) hypertension: Secondary | ICD-10-CM | POA: Diagnosis not present

## 2024-01-09 DIAGNOSIS — Z0000 Encounter for general adult medical examination without abnormal findings: Secondary | ICD-10-CM | POA: Diagnosis not present

## 2024-01-09 DIAGNOSIS — M419 Scoliosis, unspecified: Secondary | ICD-10-CM | POA: Diagnosis not present

## 2024-01-18 DIAGNOSIS — E2839 Other primary ovarian failure: Secondary | ICD-10-CM | POA: Diagnosis not present

## 2024-01-18 DIAGNOSIS — M8588 Other specified disorders of bone density and structure, other site: Secondary | ICD-10-CM | POA: Diagnosis not present

## 2024-01-18 DIAGNOSIS — N958 Other specified menopausal and perimenopausal disorders: Secondary | ICD-10-CM | POA: Diagnosis not present

## 2024-01-18 DIAGNOSIS — Z1231 Encounter for screening mammogram for malignant neoplasm of breast: Secondary | ICD-10-CM | POA: Diagnosis not present

## 2024-01-22 ENCOUNTER — Other Ambulatory Visit: Payer: Self-pay | Admitting: Obstetrics and Gynecology

## 2024-01-23 NOTE — Telephone Encounter (Signed)
 Pt has visit 01/30/24

## 2024-01-26 ENCOUNTER — Ambulatory Visit: Payer: Medicare Other | Admitting: Internal Medicine

## 2024-01-26 ENCOUNTER — Encounter

## 2024-01-26 ENCOUNTER — Ambulatory Visit: Attending: Cardiovascular Disease

## 2024-01-26 DIAGNOSIS — I4891 Unspecified atrial fibrillation: Secondary | ICD-10-CM | POA: Insufficient documentation

## 2024-01-26 DIAGNOSIS — Z5181 Encounter for therapeutic drug level monitoring: Secondary | ICD-10-CM | POA: Diagnosis not present

## 2024-01-26 LAB — POCT INR: INR: 1.7 — AB (ref 2.0–3.0)

## 2024-01-26 NOTE — Patient Instructions (Signed)
 Description   Take 1.5 tablets today and then continue taking Warfarin 0.5 tablet daily except 1 tablet on Thursdays. Stay consistent with greens (2x per week)  Recheck INR in 3 weeks.  Coumadin  Clinic [phone]

## 2024-01-27 DIAGNOSIS — Z7901 Long term (current) use of anticoagulants: Secondary | ICD-10-CM | POA: Diagnosis not present

## 2024-01-27 DIAGNOSIS — M810 Age-related osteoporosis without current pathological fracture: Secondary | ICD-10-CM | POA: Diagnosis not present

## 2024-01-27 DIAGNOSIS — K219 Gastro-esophageal reflux disease without esophagitis: Secondary | ICD-10-CM | POA: Diagnosis not present

## 2024-01-27 DIAGNOSIS — I480 Paroxysmal atrial fibrillation: Secondary | ICD-10-CM | POA: Diagnosis not present

## 2024-01-30 ENCOUNTER — Encounter: Payer: Self-pay | Admitting: Obstetrics and Gynecology

## 2024-01-30 ENCOUNTER — Ambulatory Visit: Admitting: Obstetrics and Gynecology

## 2024-01-30 VITALS — BP 122/86 | HR 99

## 2024-01-30 DIAGNOSIS — N3941 Urge incontinence: Secondary | ICD-10-CM | POA: Diagnosis not present

## 2024-01-30 DIAGNOSIS — N952 Postmenopausal atrophic vaginitis: Secondary | ICD-10-CM | POA: Diagnosis not present

## 2024-01-30 MED ORDER — ESTRADIOL 0.1 MG/GM VA CREA: 0.5000 g | TOPICAL_CREAM | VAGINAL | 11 refills | Status: AC

## 2024-01-30 NOTE — Progress Notes (Signed)
 [facility] Return Visit  SUBJECTIVE  History of Present Illness: Hannah Downs is a 78 y.o. female seen in follow-up for OAB. Plan at last visit was continue Trospium  after surgery with potential plans to discontinue.   Patient has had x2 cardioversion procedures and their therapeutic properties did not maintain.   Patient has also been diagnosed with osteoporosis.   She reports that since she is on lasix , she does not notice much utility with the Trospium .   Past Medical History: Patient  has a past medical history of Anticoagulated on Coumadin , Arthritis, Dyspnea, GERD (gastroesophageal reflux disease), History of CVA (cerebrovascular accident) without residual deficits (10/2021), Hyperlipidemia, Hypertension, Lumbar degenerative disc disease, Osteopenia, PAF (paroxysmal atrial fibrillation) (HCC) (2021), Presence of pessary, 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 organs, Seasonal allergies, Varicose vein, Vitamin D  deficiency, and Wears glasses.   Past Surgical History: She  has a past surgical history that includes Dilation and curettage of uterus (1998); Inguinal hernia repair (Left, 02/03/2007); Total hip arthroplasty (Right, 06/26/2014); Femoral hernia repair (Left, 11/02/2006); Robotic assisted diagnostic laparoscopy (09/18/2021); Inguinal hernia repair (N/A, 11/04/2021); Colpocleisis (N/A, 06/20/2023); Cystoscopy (N/A, 06/20/2023); CARDIOVERSION (N/A, 08/12/2023); and CARDIOVERSION (N/A, 09/29/2023).   Medications: She has a current medication list which includes the following prescription(s): acetaminophen , atorvastatin , vitamin d3, furosemide , omeprazole , potassium chloride , warfarin, estradiol , and metoprolol  tartrate.   Allergies: Patient is allergic to thiazide-type diuretics and zestril [lisinopril].   Social History: Patient  reports that she has never smoked. She has never used smokeless tobacco. She reports that she does not drink alcohol and does not use drugs.      OBJECTIVE     Physical Exam: Vitals:   01/30/24 1407  BP: 122/86  Pulse: 99   Gen: No apparent distress, A&O x 3.  Detailed Urogynecologic Evaluation:  Deferred.   ASSESSMENT AND PLAN    Ms. Canaday is a 78 y.o. with:  1. Urge incontinence   2. Vaginal atrophy    Patient is on Lasix  and can discontinue Trospium . We discussed that if she has significant overactive episodes or urgency that is debilitating we can consider going back on the Trospium  or going to another medication, but patient reports she is used to the morning time frequency related to her Lasix .  Patient to continue on vaginal estrogen x2 weekly for vaginal atrophy and UTI prevention. We discussed her PCP can refill this for her if needed.   Patient to follow up as needed   Lu Paradise G Lynwood Kubisiak, NP

## 2024-02-16 ENCOUNTER — Ambulatory Visit

## 2024-02-19 ENCOUNTER — Other Ambulatory Visit: Payer: Self-pay | Admitting: Obstetrics and Gynecology

## 2024-02-27 ENCOUNTER — Ambulatory Visit: Attending: Cardiovascular Disease

## 2024-02-27 DIAGNOSIS — Z5181 Encounter for therapeutic drug level monitoring: Secondary | ICD-10-CM | POA: Insufficient documentation

## 2024-02-27 DIAGNOSIS — I4891 Unspecified atrial fibrillation: Secondary | ICD-10-CM | POA: Diagnosis not present

## 2024-02-27 LAB — POCT INR: INR: 1.5 — AB (ref 2.0–3.0)

## 2024-02-27 NOTE — Patient Instructions (Signed)
 Description   Take 1 tablet today and then START taking Warfarin 0.5 tablet daily except 1 tablet on Tuesdays and Thursdays. Stay consistent with greens (2x per week)  Recheck INR in 2 weeks.  Coumadin  Clinic [phone]

## 2024-03-12 ENCOUNTER — Ambulatory Visit: Attending: Cardiovascular Disease

## 2024-03-12 DIAGNOSIS — Z5181 Encounter for therapeutic drug level monitoring: Secondary | ICD-10-CM | POA: Diagnosis not present

## 2024-03-12 DIAGNOSIS — I480 Paroxysmal atrial fibrillation: Secondary | ICD-10-CM | POA: Diagnosis not present

## 2024-03-12 DIAGNOSIS — I4891 Unspecified atrial fibrillation: Secondary | ICD-10-CM | POA: Insufficient documentation

## 2024-03-12 LAB — POCT INR: INR: 1.8 — AB (ref 2.0–3.0)

## 2024-03-12 NOTE — Patient Instructions (Signed)
 Take 1 tablet today only then continue taking Warfarin 0.5 tablet daily except 1 tablet on Tuesdays and Thursdays. Stay consistent with greens (2x per week)  Recheck INR in 3 weeks.  Coumadin  Clinic [phone]

## 2024-03-12 NOTE — Progress Notes (Signed)
Please see anticoagulation encounter.

## 2024-03-27 ENCOUNTER — Ambulatory Visit: Attending: Cardiology | Admitting: Internal Medicine

## 2024-03-27 VITALS — BP 140/90 | HR 103 | Ht 60.0 in | Wt 123.0 lb

## 2024-03-27 DIAGNOSIS — I340 Nonrheumatic mitral (valve) insufficiency: Secondary | ICD-10-CM | POA: Diagnosis not present

## 2024-03-27 DIAGNOSIS — R0602 Shortness of breath: Secondary | ICD-10-CM | POA: Insufficient documentation

## 2024-03-27 DIAGNOSIS — I639 Cerebral infarction, unspecified: Secondary | ICD-10-CM | POA: Insufficient documentation

## 2024-03-27 DIAGNOSIS — I4891 Unspecified atrial fibrillation: Secondary | ICD-10-CM | POA: Insufficient documentation

## 2024-03-27 DIAGNOSIS — I10 Essential (primary) hypertension: Secondary | ICD-10-CM | POA: Insufficient documentation

## 2024-03-27 DIAGNOSIS — I480 Paroxysmal atrial fibrillation: Secondary | ICD-10-CM | POA: Insufficient documentation

## 2024-03-27 DIAGNOSIS — I5032 Chronic diastolic (congestive) heart failure: Secondary | ICD-10-CM | POA: Diagnosis not present

## 2024-03-27 DIAGNOSIS — I4819 Other persistent atrial fibrillation: Secondary | ICD-10-CM | POA: Insufficient documentation

## 2024-03-27 MED ORDER — FUROSEMIDE 20 MG PO TABS: 20.0000 mg | ORAL_TABLET | Freq: Every day | ORAL | 3 refills | Status: DC

## 2024-03-27 NOTE — Patient Instructions (Signed)
 Medication Instructions:  The current medical regimen is effective;  continue present plan and medications.  *If you need a refill on your cardiac medications before your next appointment, please call your pharmacy*  Lab Work: Please have blood work today (BNP and BMP)  If you have labs (blood work) drawn today and your tests are completely normal, you will receive your results only by: MyChart Message (if you have MyChart) OR A paper copy in the mail If you have any lab test that is abnormal or we need to change your treatment, we will call you to review the results.  Testing/Procedures: Your physician has requested that you have an echocardiogram in October. Echocardiography is a painless test that uses sound waves to create images of your heart. It provides your doctor with information about the size and shape of your heart and how well your heart's chambers and valves are working. This procedure takes approximately one hour. There are no restrictions for this procedure. Please do NOT wear cologne, perfume, aftershave, or lotions (deodorant is allowed). Please arrive 15 minutes prior to your appointment time.  Please note: We ask at that you not bring children with you during ultrasound (echo/ vascular) testing. Due to room size and safety concerns, children are not allowed in the ultrasound rooms during exams. Our front office staff cannot provide observation of children in our lobby area while testing is being conducted. An adult accompanying a patient to their appointment will only be allowed in the ultrasound room at the discretion of the ultrasound technician under special circumstances. We apologize for any inconvenience.   Follow-Up: At [facility], you and your health needs are our priority.  As part of our continuing mission to provide you with exceptional heart care, our providers are all part of one team.  This team includes your primary Cardiologist (physician) and  Advanced Practice Providers or APPs (Physician Assistants and Nurse Practitioners) who all work together to provide you with the care you need, when you need it.  Your next appointment:   6 month(s)  Provider:   Glendia Ferrier or Dr Emilee.   We recommend signing up for the patient portal called MyChart.  Sign up information is provided on this After Visit Summary.  MyChart is used to connect with patients for Virtual Visits (Telemedicine).  Patients are able to view lab/test results, encounter notes, upcoming appointments, etc.  Non-urgent messages can be sent to your provider as well.   To learn more about what you can do with MyChart, go to [email or web address].

## 2024-03-27 NOTE — Progress Notes (Signed)
 Cardiology Office Note:  .    Date:  03/27/2024  ID:  Hannah Downs, DOB 1946/04/21, MRN [id number] PCP: Teresa Channel, MD  [facility] Providers Cardiologist:  Stanly DELENA Leavens, MD Cardiology APP:  Lelon Glendia DASEN, PA-C     CC: MR follow up  History of Present Illness: .    Hannah Downs is a 78 y.o. female with permanent atrial fibrillation who presents for cardiovascular evaluation.  She has a history of atrial fibrillation, initially persistent, treated with cardioversion, but developed a symptomatic junctional rhythm, leading to the cessation of AV nodal therapy. She reverted to atrial fibrillation and has been evaluated by electrophysiology, discussing options such as pacemaker placement and rhythm or rate control strategies, including possible AV junction ablation. She is currently on Coumadin  for anticoagulation.  She experiences shortness of breath, particularly when walking short distances, such as from her front door to the car, which resolves upon sitting. She feels winded and tires easily during activities. She has been diagnosed with osteoporosis and wonders if it contributes to her symptoms.  She was told she has moderate mitral regurgitation based on her last echocardiogram from 2024. She experiences elevated blood pressure readings during medical visits, although she reports normal readings at home. She is currently taking Lasix  20 mg, which she recently had refilled.  Discussed the use of AI scribe software for clinical note transcription with the patient, who gave verbal consent to proceed.   Relevant histories: .  Social  - I see her husband (HCM) and daughter (HLD) ROS: As per HPI.   Studies Reviewed: .     Cardiac Studies & Procedures   ______________________________________________________________________________________________     ECHOCARDIOGRAM  ECHOCARDIOGRAM COMPLETE 06/23/2023  Narrative ECHOCARDIOGRAM  REPORT    Patient Name:   Hannah Downs Date of Exam: 06/23/2023 Medical Rec #:  [id number]           Height:       61.0 in Accession #:    [id number]          Weight:       125.1 lb Date of Birth:  1945-10-31           BSA:          1.547 m Patient Age:    51 years            BP:           106/87 mmHg Patient Gender: F                   HR:           87 bpm. Exam Location:  Inpatient  Procedure: 2D Echo, Cardiac Doppler and Color Doppler  Indications:    CHF-Acute Diastolic I50.31  History:        Patient has prior history of Echocardiogram examinations, most recent 11/08/2021. CHF, Stroke, Arrythmias:Atrial Fibrillation, Signs/Symptoms:Hypotension and Dyspnea; Risk Factors:Hypertension and Dyslipidemia.  Sonographer:    Thea Norlander Referring Phys: [id number] RIPUDEEP K RAI  IMPRESSIONS   1. Left ventricular ejection fraction, by estimation, is 60 to 65%. The left ventricle has normal function. The left ventricle has no regional wall motion abnormalities. Left ventricular diastolic parameters were normal. 2. Right ventricular systolic function is normal. The right ventricular size is normal. There is moderately elevated pulmonary artery systolic pressure. The estimated right ventricular systolic pressure is 45.0 mmHg. 3. Left atrial size was upper limit of normal. 4. Right atrial size was dilated.  5. The mitral valve is normal in structure. Moderate mitral valve regurgitation. No evidence of mitral stenosis. 6. Tricuspid valve regurgitation is moderate to severe. 7. The aortic valve is normal in structure. Aortic valve regurgitation is not visualized. No aortic stenosis is present. 8. The inferior vena cava is dilated in size with <50% respiratory variability, suggesting right atrial pressure of 15 mmHg.  Comparison(s): A prior study was performed on 11/08/2021. Grade 2 diastolic dysfunction is now normal. Mitral / tricuspid valve regurgitation and estimated RAP of are  new findings compared to prior study.  FINDINGS Left Ventricle: Left ventricular ejection fraction, by estimation, is 60 to 65%. The left ventricle has normal function. The left ventricle has no regional wall motion abnormalities. The left ventricular internal cavity size was normal in size. There is no left ventricular hypertrophy. Left ventricular diastolic parameters were normal.  Right Ventricle: The right ventricular size is normal. No increase in right ventricular wall thickness. Right ventricular systolic function is normal. There is moderately elevated pulmonary artery systolic pressure. The tricuspid regurgitant velocity is 2.74 m/s, and with an assumed right atrial pressure of 15 mmHg, the estimated right ventricular systolic pressure is 45.0 mmHg.  Left Atrium: Left atrial size was upper limit of normal.  Right Atrium: Right atrial size was dilated.  Pericardium: There is no evidence of pericardial effusion.  Mitral Valve: The mitral valve is normal in structure. Moderate mitral valve regurgitation. No evidence of mitral valve stenosis.  Tricuspid Valve: The tricuspid valve is normal in structure. Tricuspid valve regurgitation is moderate to severe. No evidence of tricuspid stenosis.  Aortic Valve: The aortic valve is normal in structure. Aortic valve regurgitation is not visualized. No aortic stenosis is present. Aortic valve peak gradient measures 4.5 mmHg.  Pulmonic Valve: The pulmonic valve was normal in structure. Pulmonic valve regurgitation is trivial. No evidence of pulmonic stenosis.  Aorta: The aortic root and ascending aorta are structurally normal, with no evidence of dilitation.  Venous: The inferior vena cava is dilated in size with less than 50% respiratory variability, suggesting right atrial pressure of 15 mmHg.  IAS/Shunts: The atrial septum is grossly normal.   LEFT VENTRICLE PLAX 2D LVIDd:         4.00 cm   Diastology LVIDs:         2.70 cm   LV e'  medial:    10.40 cm/s LV PW:         1.10 cm   LV E/e' medial:  7.7 LV IVS:        1.00 cm   LV e' lateral:   11.00 cm/s LVOT diam:     2.00 cm   LV E/e' lateral: 7.3 LV SV:         29 LV SV Index:   19 LVOT Area:     3.14 cm   RIGHT VENTRICLE             IVC RV S prime:     12.30 cm/s  IVC diam: 2.60 cm TAPSE (M-mode): 1.5 cm  LEFT ATRIUM             Index        RIGHT ATRIUM           Index LA diam:        4.10 cm 2.65 cm/m   RA Area:     23.50 cm LA Vol (A2C):   54.6 ml 35.29 ml/m  RA Volume:   64.10 ml  41.43 ml/m LA Vol (A4C):   50.8 ml 32.83 ml/m LA Biplane Vol: 54.6 ml 35.29 ml/m AORTIC VALVE AV Area (Vmax): 2.00 cm AV Vmax:        106.00 cm/s AV Peak Grad:   4.5 mmHg LVOT Vmax:      67.57 cm/s LVOT Vmean:     42.433 cm/s LVOT VTI:       0.091 m  AORTA Ao Root diam: 3.10 cm Ao Asc diam:  3.30 cm  MITRAL VALVE               TRICUSPID VALVE MV Area (PHT): 4.80 cm    TR Peak grad:   30.0 mmHg MV Decel Time: 158 msec    TR Vmax:        274.00 cm/s MR Peak grad: 96.0 mmHg MR Vmax:      490.00 cm/s  SHUNTS MV E velocity: 80.10 cm/s  Systemic VTI:  0.09 m Systemic Diam: 2.00 cm  Sunit Tolia Electronically signed by Madonna Large Signature Date/Time: 06/24/2023/2:08:58 PM    Final    MONITORS  LONG TERM MONITOR (3-14 DAYS) 10/20/2023  Narrative   100% atrial fibrillation burden, rate controlled on average (84 bpm).  No symptoms.  Persistent atrial fibrillation.       ______________________________________________________________________________________________       Physical Exam:    VS:  BP (!) 140/90 (BP Location: Right Arm)   Pulse (!) 103   Ht 5' (1.524 m)   Wt 123 lb (55.8 kg)   SpO2 95%   BMI 24.02 kg/m    Wt Readings from Last 3 Encounters:  03/27/24 123 lb (55.8 kg)  10/18/23 119 lb 12.8 oz (54.3 kg)  10/05/23 121 lb 1.6 oz (54.9 kg)    Gen: no distress,  Neck: No JVD, Cardiac: No Rubs or Gallops, holosystolic Murmur, IRIR +2  radial pulses Respiratory: Clear to auscultation bilaterally, normal effort, normal  respiratory rate GI: Soft, nontender, non-distended  MS: trace bilateral  edema;  moves all extremities Integument: Skin feels warm Neuro:  At time of evaluation, alert and oriented to person/place/time/situation  Psych: Normal affect, patient feels ok   ASSESSMENT AND PLAN: .    An EKG was ordered for AF and shows AF  Permanent atrial fibrillation Permanent atrial fibrillation with previous attempts at cardioversion and medication management. Current heart rate is 103 bpm, attributed to nervousness. Previous rhythm control strategies led to symptomatic junctional rhythm. She prefers to remain in atrial fibrillation without pacemaker intervention, similar to her husband's long-term management of atrial fibrillation. - Continue current management without pacemaker intervention. - Discuss potential future interventions if symptoms worsen (AVJ) - tolerate current metoprolol )  Moderate mitral regurgitation Moderate mitral regurgitation associated with atrial dilation due to atrial fibrillation. Discussed potential future need for valve repair if condition worsens, but currently no open heart surgery planned due to age and potential risks. Explained transcatheter approach for valve repair if necessary in the future. - Order echocardiogram in October to monitor mitral regurgitation. - Consider transcatheter valve repair if condition becomes severe.  Shortness of breath Shortness of breath on exertion, relieved by rest. Possible causes include deconditioning, fluid overload, or mitral regurgitation. Shortness of breath not consistent with heart failure as it improves with rest and use of a walker. Deconditioning suspected due to recent hospitalization and age. - BMP and BNP testing to evaluate for fluid overload. - Discuss potential for physical therapy to improve conditioning.   Stanly Leavens, MD FASE  FACC  Cardiologist [facility]  [address] [place], [address] [place], [place] [address] [phone]  5:59 PM

## 2024-03-28 ENCOUNTER — Ambulatory Visit: Payer: Self-pay | Admitting: Emergency Medicine

## 2024-03-28 DIAGNOSIS — I5032 Chronic diastolic (congestive) heart failure: Secondary | ICD-10-CM

## 2024-03-28 DIAGNOSIS — R7989 Other specified abnormal findings of blood chemistry: Secondary | ICD-10-CM

## 2024-03-28 DIAGNOSIS — E875 Hyperkalemia: Secondary | ICD-10-CM

## 2024-03-28 LAB — PRO B NATRIURETIC PEPTIDE: NT-Pro BNP: 3174 pg/mL — ABNORMAL HIGH (ref 0–738)

## 2024-03-28 LAB — BASIC METABOLIC PANEL WITH GFR
BUN/Creatinine Ratio: 19 (ref 12–28)
BUN: 14 mg/dL (ref 8–27)
CO2: 20 mmol/L (ref 20–29)
Calcium: 9.8 mg/dL (ref 8.7–10.3)
Chloride: 101 mmol/L (ref 96–106)
Creatinine, Ser: 0.73 mg/dL (ref 0.57–1.00)
Glucose: 98 mg/dL (ref 70–99)
Potassium: 5.3 mmol/L — ABNORMAL HIGH (ref 3.5–5.2)
Sodium: 141 mmol/L (ref 134–144)
eGFR: 84 mL/min/1.73 (ref >59)

## 2024-03-28 MED ORDER — FUROSEMIDE 40 MG PO TABS: 40.0000 mg | ORAL_TABLET | Freq: Every day | ORAL | 3 refills | Status: DC

## 2024-03-28 NOTE — Telephone Encounter (Addendum)
 Santo Stanly LABOR, MD to [facility] Triage  (Selected Message) [facility]    03/28/24 12:48 PM BNP is very elevated K is elevated She is in the setting of her cutting back her diuretic dose to 20 mg.  Would increase to 40 mg and repeat labs in 2 weeks.   Stanly Santo, MD FASE [facility] Cardiologist [facility]  [address] [place], [place] [address] [phone]  12:48 PM  Left message with the information above; All information sent over MyChart- RX sent to pharmacy, lab orders placed

## 2024-03-29 NOTE — Telephone Encounter (Signed)
 Patient returned RN's call regarding results.

## 2024-04-02 ENCOUNTER — Ambulatory Visit: Attending: Cardiovascular Disease

## 2024-04-02 DIAGNOSIS — Z5181 Encounter for therapeutic drug level monitoring: Secondary | ICD-10-CM | POA: Insufficient documentation

## 2024-04-02 DIAGNOSIS — I480 Paroxysmal atrial fibrillation: Secondary | ICD-10-CM | POA: Diagnosis not present

## 2024-04-02 DIAGNOSIS — I4891 Unspecified atrial fibrillation: Secondary | ICD-10-CM | POA: Insufficient documentation

## 2024-04-02 LAB — POCT INR: INR: 1.5 — AB (ref 2.0–3.0)

## 2024-04-02 NOTE — Progress Notes (Signed)
 INR 1.5  Please see anticoagulation encounter

## 2024-04-02 NOTE — Patient Instructions (Signed)
 Take 1 tablet today only then Increase to 1 tablet daily except 0.5 tablet on Mondays, Wednesdays and Fridays Stay consistent with greens (2x per week)  Recheck INR in 3 weeks.  Coumadin  Clinic [phone]

## 2024-04-12 DIAGNOSIS — E875 Hyperkalemia: Secondary | ICD-10-CM | POA: Diagnosis not present

## 2024-04-12 DIAGNOSIS — I5032 Chronic diastolic (congestive) heart failure: Secondary | ICD-10-CM | POA: Diagnosis not present

## 2024-04-12 DIAGNOSIS — R7989 Other specified abnormal findings of blood chemistry: Secondary | ICD-10-CM | POA: Diagnosis not present

## 2024-04-13 LAB — BASIC METABOLIC PANEL WITH GFR
BUN/Creatinine Ratio: 25 (ref 12–28)
BUN: 18 mg/dL (ref 8–27)
CO2: 24 mmol/L (ref 20–29)
Calcium: 9.6 mg/dL (ref 8.7–10.3)
Chloride: 101 mmol/L (ref 96–106)
Creatinine, Ser: 0.72 mg/dL (ref 0.57–1.00)
Glucose: 74 mg/dL (ref 70–99)
Potassium: 4.5 mmol/L (ref 3.5–5.2)
Sodium: 141 mmol/L (ref 134–144)
eGFR: 86 mL/min/1.73 (ref >59)

## 2024-04-13 LAB — PRO B NATRIURETIC PEPTIDE: NT-Pro BNP: 2965 pg/mL — ABNORMAL HIGH (ref 0–738)

## 2024-04-16 MED ORDER — POTASSIUM CHLORIDE ER 10 MEQ PO TBCR: 20.0000 meq | EXTENDED_RELEASE_TABLET | Freq: Every day | ORAL | 3 refills | Status: DC

## 2024-04-16 MED ORDER — FUROSEMIDE 40 MG PO TABS: 60.0000 mg | ORAL_TABLET | Freq: Every day | ORAL | 3 refills | Status: DC

## 2024-04-16 NOTE — Telephone Encounter (Signed)
 Called pt reviewed MD recommendations.  Pt expresses understanding had no questions or concerns.

## 2024-04-23 ENCOUNTER — Ambulatory Visit: Attending: Internal Medicine

## 2024-04-23 DIAGNOSIS — R7989 Other specified abnormal findings of blood chemistry: Secondary | ICD-10-CM | POA: Diagnosis not present

## 2024-04-23 DIAGNOSIS — I4891 Unspecified atrial fibrillation: Secondary | ICD-10-CM | POA: Diagnosis not present

## 2024-04-23 DIAGNOSIS — I5032 Chronic diastolic (congestive) heart failure: Secondary | ICD-10-CM | POA: Diagnosis not present

## 2024-04-23 DIAGNOSIS — Z5181 Encounter for therapeutic drug level monitoring: Secondary | ICD-10-CM | POA: Insufficient documentation

## 2024-04-23 DIAGNOSIS — I480 Paroxysmal atrial fibrillation: Secondary | ICD-10-CM | POA: Insufficient documentation

## 2024-04-23 DIAGNOSIS — E875 Hyperkalemia: Secondary | ICD-10-CM | POA: Diagnosis not present

## 2024-04-23 LAB — POCT INR: INR: 3.2 — AB (ref 2.0–3.0)

## 2024-04-23 NOTE — Patient Instructions (Signed)
 Continue 1 tablet daily except 0.5 tablet on Mondays, Wednesdays and Fridays.  Eat greens tonight or tomorrow. Stay consistent with greens (2x per week)  Recheck INR in 4 weeks.  Coumadin  Clinic [phone]

## 2024-04-23 NOTE — Progress Notes (Signed)
 INR 3.2 Please see anticoagulation encounter

## 2024-04-24 LAB — BASIC METABOLIC PANEL WITH GFR
BUN/Creatinine Ratio: 20 (ref 12–28)
BUN: 17 mg/dL (ref 8–27)
CO2: 24 mmol/L (ref 20–29)
Calcium: 10.3 mg/dL (ref 8.7–10.3)
Chloride: 98 mmol/L (ref 96–106)
Creatinine, Ser: 0.86 mg/dL (ref 0.57–1.00)
Glucose: 90 mg/dL (ref 70–99)
Potassium: 4.9 mmol/L (ref 3.5–5.2)
Sodium: 140 mmol/L (ref 134–144)
eGFR: 69 mL/min/1.73 (ref >59)

## 2024-05-18 ENCOUNTER — Ambulatory Visit

## 2024-05-21 ENCOUNTER — Ambulatory Visit: Attending: Cardiovascular Disease

## 2024-05-21 DIAGNOSIS — I480 Paroxysmal atrial fibrillation: Secondary | ICD-10-CM | POA: Diagnosis not present

## 2024-05-21 DIAGNOSIS — I4891 Unspecified atrial fibrillation: Secondary | ICD-10-CM | POA: Insufficient documentation

## 2024-05-21 DIAGNOSIS — Z5181 Encounter for therapeutic drug level monitoring: Secondary | ICD-10-CM | POA: Insufficient documentation

## 2024-05-21 LAB — POCT INR: INR: 2.9 (ref 2.0–3.0)

## 2024-05-21 NOTE — Progress Notes (Signed)
 INR 2.9 Please see anticoagulation encounter Continue 1 tablet daily except 0.5 tablet on Mondays, Wednesdays and Fridays.  Stay consistent with greens (2x per week)  Recheck INR in 6 weeks.  Coumadin  Clinic [phone]

## 2024-05-21 NOTE — Patient Instructions (Signed)
 Continue 1 tablet daily except 0.5 tablet on Mondays, Wednesdays and Fridays.  Stay consistent with greens (2x per week)  Recheck INR in 6 weeks.  Coumadin  Clinic [phone]

## 2024-06-14 ENCOUNTER — Other Ambulatory Visit: Payer: Self-pay | Admitting: Internal Medicine

## 2024-06-14 DIAGNOSIS — I480 Paroxysmal atrial fibrillation: Secondary | ICD-10-CM

## 2024-06-14 MED ORDER — WARFARIN SODIUM 5 MG PO TABS: 2.5000 mg | ORAL_TABLET | Freq: Every day | ORAL | Status: DC

## 2024-06-14 MED ORDER — POTASSIUM CHLORIDE ER 10 MEQ PO TBCR: 20.0000 meq | EXTENDED_RELEASE_TABLET | Freq: Every day | ORAL | 3 refills | Status: AC

## 2024-06-14 MED ORDER — WARFARIN SODIUM 5 MG PO TABS: 2.5000 mg | ORAL_TABLET | Freq: Every day | ORAL | 1 refills | Status: DC

## 2024-06-27 ENCOUNTER — Ambulatory Visit (HOSPITAL_COMMUNITY)
Admission: RE | Admit: 2024-06-27 | Discharge: 2024-06-27 | Disposition: A | Discharge status: Home / Self Care | Source: Ambulatory Visit | Attending: Cardiovascular Disease | Admitting: Cardiovascular Disease

## 2024-06-27 DIAGNOSIS — I340 Nonrheumatic mitral (valve) insufficiency: Secondary | ICD-10-CM | POA: Diagnosis not present

## 2024-06-27 DIAGNOSIS — I4819 Other persistent atrial fibrillation: Secondary | ICD-10-CM | POA: Insufficient documentation

## 2024-06-27 LAB — ECHOCARDIOGRAM COMPLETE
MV M vel: 5.37 m/s
MV Peak grad: 115.3 mmHg
S' Lateral: 2.6 cm

## 2024-06-29 MED ORDER — FUROSEMIDE 80 MG PO TABS
80.0000 mg | ORAL_TABLET | Freq: Every day | ORAL | 1 refills | Status: DC
Start: 2024-06-29 — End: 2024-07-06

## 2024-06-29 NOTE — Addendum Note (Signed)
 Addended by: RANDY HAMP SAILOR on: 06/29/2024 07:49 AM   Modules accepted: Orders

## 2024-07-02 ENCOUNTER — Ambulatory Visit: Attending: Cardiovascular Disease

## 2024-07-02 DIAGNOSIS — Z5181 Encounter for therapeutic drug level monitoring: Secondary | ICD-10-CM | POA: Diagnosis not present

## 2024-07-02 DIAGNOSIS — I4891 Unspecified atrial fibrillation: Secondary | ICD-10-CM | POA: Insufficient documentation

## 2024-07-02 DIAGNOSIS — I480 Paroxysmal atrial fibrillation: Secondary | ICD-10-CM | POA: Insufficient documentation

## 2024-07-02 LAB — POCT INR: INR: 2.6 (ref 2.0–3.0)

## 2024-07-02 NOTE — Progress Notes (Signed)
 INR 2.6 Please see anticoagulation encounter Continue 1 tablet daily except 0.5 tablet on Mondays, Wednesdays and Fridays.  Stay consistent with greens (2x per week)  Recheck INR in 6 weeks.  Coumadin  Clinic [phone]

## 2024-07-02 NOTE — Patient Instructions (Signed)
 Continue 1 tablet daily except 0.5 tablet on Mondays, Wednesdays and Fridays.  Stay consistent with greens (2x per week)  Recheck INR in 6 weeks.  Coumadin  Clinic [phone]

## 2024-07-06 ENCOUNTER — Ambulatory Visit: Attending: Internal Medicine | Admitting: Internal Medicine

## 2024-07-06 VITALS — BP 130/90 | HR 94 | Ht 60.0 in | Wt 122.0 lb

## 2024-07-06 DIAGNOSIS — I361 Nonrheumatic tricuspid (valve) insufficiency: Secondary | ICD-10-CM | POA: Diagnosis not present

## 2024-07-06 DIAGNOSIS — I5032 Chronic diastolic (congestive) heart failure: Secondary | ICD-10-CM | POA: Diagnosis not present

## 2024-07-06 DIAGNOSIS — I480 Paroxysmal atrial fibrillation: Secondary | ICD-10-CM | POA: Diagnosis not present

## 2024-07-06 MED ORDER — TORSEMIDE 40 MG PO TABS: ORAL_TABLET | ORAL | 3 refills | Status: AC

## 2024-07-06 NOTE — Patient Instructions (Addendum)
 Medication Instructions:  Stop Lasix  Start Torsemide 40 mg daily Continue all other medications *If you need a refill on your cardiac medications before your next appointment, please call your pharmacy*  Lab Work: None ordered  Testing/Procedures: Schedule Echo in 12/2024  Follow-Up: At [facility], you and your health needs are our priority.  As part of our continuing mission to provide you with exceptional heart care, our providers are all part of one team.  This team includes your primary Cardiologist (physician) and Advanced Practice Providers or APPs (Physician Assistants and Nurse Practitioners) who all work together to provide you with the care you need, when you need it.  Your next appointment:  After Echo in 7 months  Call in Feb to schedule appointment in May    Provider:  Dr. Emilee   We recommend signing up for the patient portal called MyChart.  Sign up information is provided on this After Visit Summary.  MyChart is used to connect with patients for Virtual Visits (Telemedicine).  Patients are able to view lab/test results, encounter notes, upcoming appointments, etc.  Non-urgent messages can be sent to your provider as well.   To learn more about what you can do with MyChart, go to [email or web address].

## 2024-07-06 NOTE — Progress Notes (Signed)
 Cardiology Office Note:  .    Date:  07/06/2024  ID:  Hannah Downs, DOB December 07, 1945, MRN [id number] PCP: Teresa Channel, MD  [facility] Providers Cardiologist:  Stanly DELENA Leavens, MD Cardiology APP:  Lelon Glendia DASEN, PA-C     CC: TR follow up  History of Present Illness: .    Hannah Downs is a 78 y.o. female with permanent atrial fibrillation who presents for echo f/u.  Hannah Downs is a 78 year old female with permanent atrial fibrillation and valvular heart disease who presents with shortness of breath and medication side effects. She is accompanied by her husband and daughter.  She has a history of permanent atrial fibrillation, tricuspid regurgitation, and mitral regurgitation. Multiple attempts at rhythm control, including cardioversion and medication management, have been unsuccessful. She is not interested in pursuing a pacemaker for AV nodal junctional ablation.  She experiences shortness of breath during activities such as walking long distances or vacuuming. This symptom has remained unchanged over time, and she continues to tire easily.  She has been taking furosemide  (Lasix ) for her condition, but it causes incontinence or diarrhea, which is dose-dependent. The symptoms worsened when her dose was increased to 80 mg.  Discussed the use of AI scribe software for clinical note transcription with the patient, who gave verbal consent to proceed.   Relevant histories: .  Social  - I see her husband (HCM) and daughter (HLD) ROS: As per HPI.   Studies Reviewed: .     Cardiac Studies & Procedures   ______________________________________________________________________________________________     ECHOCARDIOGRAM  ECHOCARDIOGRAM COMPLETE 06/27/2024  Narrative ECHOCARDIOGRAM REPORT    Patient Name:   Hannah Downs Date of Exam: 06/27/2024 Medical Rec #:  [id number]           Height:       60.0 in Accession #:     [id number]          Weight:       123.0 lb Date of Birth:  07-Nov-1945           BSA:          1.518 m Patient Age:    89 years            BP:           140/90 mmHg Patient Gender: F                   HR:           113 bpm. Exam Location:  [address]  Procedure: 2D Echo, Cardiac Doppler and Color Doppler (Both Spectral and Color Flow Doppler were utilized during procedure).  Indications:    I05.9 Mitral Valve Disorder  History:        Patient has prior history of Echocardiogram examinations, most recent 06/23/2023. CVA, Arrythmias:Atrial Fibrillation, Signs/Symptoms:Dyspnea; Risk Factors:Hypertension and Dyslipidemia.  Sonographer:    Carl Coma RDCS Referring Phys: [id number] [facility] A Seif Teichert  IMPRESSIONS   1. Left ventricular ejection fraction, by estimation, is 55 to 60%. The left ventricle has normal function. The left ventricle has no regional wall motion abnormalities. Left ventricular diastolic function could not be evaluated. 2. Right ventricular systolic function is normal. The right ventricular size is normal. There is mildly elevated pulmonary artery systolic pressure. 3. Left atrial size was mild to moderately dilated. 4. Right atrial size was severely dilated. 5. The mitral valve is normal in structure. Mild to moderate mitral valve  regurgitation. No evidence of mitral stenosis. 6. The tricuspid valve is abnormal. Tricuspid valve regurgitation is severe. 7. The aortic valve is tricuspid. Aortic valve regurgitation is not visualized. Aortic valve sclerosis/calcification is present, without any evidence of aortic stenosis. 8. The inferior vena cava is dilated in size with >50% respiratory variability, suggesting right atrial pressure of 8 mmHg.  Comparison(s): A prior study was performed on 04/01/2020. There was mild LVH with grade II diastolic dysfunction with increased left atrial pressure. There was mild biatrial enlargement. There was mild to moderate  tricuspid regurgitation.  FINDINGS Left Ventricle: Left ventricular ejection fraction, by estimation, is 55 to 60%. The left ventricle has normal function. The left ventricle has no regional wall motion abnormalities. The left ventricular internal cavity size was normal in size. There is no left ventricular hypertrophy. Left ventricular diastolic function could not be evaluated due to atrial fibrillation. Left ventricular diastolic function could not be evaluated.  Right Ventricle: The right ventricular size is normal. No increase in right ventricular wall thickness. Right ventricular systolic function is normal. There is mildly elevated pulmonary artery systolic pressure. The tricuspid regurgitant velocity is 2.69 m/s, and with an assumed right atrial pressure of 8 mmHg, the estimated right ventricular systolic pressure is 36.9 mmHg.  Left At[facility]: Left atrial size was mild to moderately dilated.  Right [facility]: Right atrial size was severely dilated.  Pericardium: There is no evidence of pericardial effusion.  Mitral Valve: The mitral valve is normal in structure. Mild to moderate mitral valve regurgitation, with posteriorly-directed jet. No evidence of mitral valve stenosis.  Tricuspid Valve: Poor coaptation of leaflets. The tricuspid valve is abnormal. Tricuspid valve regurgitation is severe. No evidence of tricuspid stenosis.  Aortic Valve: The aortic valve is tricuspid. Aortic valve regurgitation is not visualized. Aortic valve sclerosis/calcification is present, without any evidence of aortic stenosis.  Pulmonic Valve: The pulmonic valve was normal in structure. Pulmonic valve regurgitation is trivial. No evidence of pulmonic stenosis.  Aorta: The aortic root and ascending aorta are structurally normal, with no evidence of dilitation.  Venous: The inferior vena cava is dilated in size with greater than 50% respiratory variability, suggesting right atrial pressure of 8  mmHg.  IAS/Shunts: No atrial level shunt detected by color flow Doppler.   LEFT VENTRICLE PLAX 2D LVIDd:         3.70 cm LVIDs:         2.60 cm LV PW:         0.80 cm LV IVS:        0.90 cm LVOT diam:     1.80 cm LV SV:         28 LV SV Index:   18 LVOT Area:     2.54 cm   RIGHT VENTRICLE             IVC RV Basal diam:  3.70 cm     IVC diam: 2.40 cm RV S prime:     11.75 cm/s TAPSE (M-mode): 1.8 cm  LEFT ATRIUM             Index        RIGHT ATRIUM           Index LA diam:        4.70 cm 3.10 cm/m   RA Area:     25.90 cm LA Vol (A2C):   55.1 ml 36.29 ml/m  RA Volume:   85.70 ml  56.45 ml/m LA Vol (A4C):  48.9 ml 32.21 ml/m LA Biplane Vol: 52.5 ml 34.58 ml/m AORTIC VALVE LVOT Vmax:   68.83 cm/s LVOT Vmean:  45.233 cm/s LVOT VTI:    0.109 m  AORTA Ao Root diam: 3.20 cm Ao Asc diam:  3.30 cm  MR Peak grad: 115.3 mmHg   TRICUSPID VALVE MR Vmax:      537.00 cm/s  TR Peak grad:   28.9 mmHg MV E velocity: 94.18 cm/s  TR Vmax:        269.00 cm/s  SHUNTS Systemic VTI:  0.11 m Systemic Diam: 1.80 cm  Emeline Calender Electronically signed by Emeline Calender Signature Date/Time: 06/27/2024/3:57:13 PM    Final    MONITORS  LONG TERM MONITOR (3-14 DAYS) 10/20/2023  Narrative   100% atrial fibrillation burden, rate controlled on average (84 bpm).  No symptoms.  Persistent atrial fibrillation.       ______________________________________________________________________________________________       Physical Exam:    VS:  BP (!) 130/90 (BP Location: Left Arm, Patient Position: Sitting)   Pulse 94   Ht 5' (1.524 m)   Wt 122 lb (55.3 kg)   SpO2 95%   BMI 23.83 kg/m    Wt Readings from Last 3 Encounters:  07/06/24 122 lb (55.3 kg)  03/27/24 123 lb (55.8 kg)  10/18/23 119 lb 12.8 oz (54.3 kg)    Gen: no distress Neck: + JVD Cardiac: No Rubs or Gallops, Holosystolic Murmur, IRIR +2 radial pulses Respiratory: Clear to auscultation bilaterally, normal  effort, normal  respiratory rate GI: Soft, nontender, non-distended  MS: trace bilateral  edema;  moves all extremities Integument: Skin feels warm Neuro:  At time of evaluation, alert and oriented to person/place/time/situation  Psych: Normal affect, patient feels ok   ASSESSMENT AND PLAN: .    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 Symptoms do not appear to be atrial fibrillation related at this time, but long-term prognosis may worsen valve disease. Discussed tricuspid edge-to-edge repair, but she prefers to reevaluate after echocardiogram in six months.  We discussed the risks and benefits at lenght - Plan for transthoracic echocardiogram in six months per patient's preference - Reevaluate treatment options after echocardiogram - Continue current metoprolol  regimen  Diuretic-induced diarrhea and incontinence Diarrhea and incontinence associated with furosemide  use, dose-dependent. - Switch from furosemide  to an equivalent dose of torsemide - Pending BMP in one week to monitor response to diuretic change  Time Spent Directly with Patient:   I have spent a total of 40 minutes with the patient reviewing notes, imaging, EKGs, labs, and examining the patient as well as establishing an assessment and plan that was discussed personally with the patient. Discussed disease state education , using shared decision making tools and cardiac modeling centered around tTEER (either [facility] or [facility]). Reviewed care and plan in collaboration with husband and daughter.  Longitudinal care: The evaluation and management services provided today reflect the complexity inherent in caring for this patient, including the ongoing longitudinal relationship and management of multiple chronic conditions and/or the need for care coordination. The visit required a  comprehensive assessment and management plan tailored to the patient's unique needs Time was spent addressing not only the acute concerns but also the broader context of the patient's health, including preventive care, chronic disease management, and care coordination as appropriate.  Complex longitudinal is necessary for conditions including: patient with conservative plans for evaluation  with sx of both diuretics and tricuspid regurgitation.   Stanly Leavens, MD [facility] Cardiologist [facility]  [address] [place], [address] [place], [place] [address] [phone]  3:29 PM

## 2024-07-13 DIAGNOSIS — I5032 Chronic diastolic (congestive) heart failure: Secondary | ICD-10-CM | POA: Diagnosis not present

## 2024-07-14 LAB — BASIC METABOLIC PANEL WITH GFR
BUN/Creatinine Ratio: 22 (ref 12–28)
BUN: 21 mg/dL (ref 8–27)
CO2: 26 mmol/L (ref 20–29)
Calcium: 10.1 mg/dL (ref 8.7–10.3)
Chloride: 97 mmol/L (ref 96–106)
Creatinine, Ser: 0.95 mg/dL (ref 0.57–1.00)
Glucose: 86 mg/dL (ref 70–99)
Potassium: 4.8 mmol/L (ref 3.5–5.2)
Sodium: 142 mmol/L (ref 134–144)
eGFR: 61 mL/min/1.73 (ref >59)

## 2024-08-13 ENCOUNTER — Ambulatory Visit: Attending: Cardiology

## 2024-08-13 ENCOUNTER — Encounter: Payer: Self-pay | Admitting: Internal Medicine

## 2024-08-13 DIAGNOSIS — I480 Paroxysmal atrial fibrillation: Secondary | ICD-10-CM | POA: Insufficient documentation

## 2024-08-13 DIAGNOSIS — I4891 Unspecified atrial fibrillation: Secondary | ICD-10-CM | POA: Insufficient documentation

## 2024-08-13 DIAGNOSIS — Z5181 Encounter for therapeutic drug level monitoring: Secondary | ICD-10-CM | POA: Insufficient documentation

## 2024-08-13 LAB — POCT INR: INR: 3.1 — AB (ref 2.0–3.0)

## 2024-08-13 NOTE — Patient Instructions (Signed)
 Continue 1 tablet daily except 0.5 tablet on Mondays, Wednesdays and Fridays.  Stay consistent with greens (2x per week)  Recheck INR in 6 weeks.  Coumadin  Clinic [phone]

## 2024-08-13 NOTE — Progress Notes (Signed)
 INR 3.1 Please see anticoagulation encounter Continue 1 tablet daily except 0.5 tablet on Mondays, Wednesdays and Fridays.  Stay consistent with greens (2x per week)  Recheck INR in 6 weeks.  Coumadin  Clinic [phone]

## 2024-08-14 NOTE — Telephone Encounter (Signed)
 Left message to call back.

## 2024-08-14 NOTE — Telephone Encounter (Signed)
 Spoke with the patient at length.  Discussed option to send her to [facility] for evaluation and possible treatment of TR. She reported she does not want to go to [facility] and declined referral. At this point, she reports she feels mostly fine, just tired. She is tolerating her torsemide  and no longer having diarrhea.  Gave her [facility] direct phone number.  She will call if she changes her mind and wishes to go to [facility] for eval.  She will call if symptoms worsen as well. Otherwise, she understood I will call her when the April 2026 schedule is out to arrange a visit with Dr. Santo after her echo on 4/15. She was grateful for assistance and time spent.

## 2024-08-20 NOTE — Telephone Encounter (Signed)
 The patient called and reported she spoke with several family members since our last discussion and now wishes to be referred to [facility].   She understood her packet will be sent and she will hear from them soon to schedule an appointment.  Will route to Dr. Santo as RICK.

## 2024-09-24 ENCOUNTER — Encounter: Payer: Self-pay | Admitting: *Deleted

## 2024-09-24 ENCOUNTER — Ambulatory Visit: Attending: Cardiology

## 2024-09-24 DIAGNOSIS — I4891 Unspecified atrial fibrillation: Secondary | ICD-10-CM | POA: Diagnosis present

## 2024-09-24 DIAGNOSIS — Z5181 Encounter for therapeutic drug level monitoring: Secondary | ICD-10-CM | POA: Diagnosis present

## 2024-09-24 DIAGNOSIS — I480 Paroxysmal atrial fibrillation: Secondary | ICD-10-CM | POA: Insufficient documentation

## 2024-09-24 LAB — POCT INR: INR: 2.7 (ref 2.0–3.0)

## 2024-09-24 NOTE — Patient Instructions (Signed)
 Continue 1 tablet daily except 0.5 tablet on Mondays, Wednesdays and Fridays.  Stay consistent with greens (2x per week)  Recheck INR in 6 weeks.  Coumadin  Clinic [phone]

## 2024-09-24 NOTE — Progress Notes (Signed)
"   INR 2.7 Please see anticoagulation encounter Continue 1 tablet daily except 0.5 tablet on Mondays, Wednesdays and Fridays.  Stay consistent with greens (2x per week)  Recheck INR in 6 weeks.  Coumadin  Clinic [phone] "

## 2024-10-07 ENCOUNTER — Other Ambulatory Visit: Payer: Self-pay | Admitting: Physician Assistant

## 2024-10-12 ENCOUNTER — Other Ambulatory Visit: Payer: Self-pay | Admitting: *Deleted

## 2024-10-12 DIAGNOSIS — I480 Paroxysmal atrial fibrillation: Secondary | ICD-10-CM

## 2024-10-12 MED ORDER — WARFARIN SODIUM 5 MG PO TABS: ORAL_TABLET | ORAL | 1 refills | Status: AC

## 2024-10-12 NOTE — Telephone Encounter (Signed)
 Warfarin 5mg  Dx-Afib Last INR Check-09/24/24 Last OV- 07/06/24   [facility] called about the pt's dose and the amount as the 06/2024 rx that had refills had 180 tabs and to take 1/2 tablet daily. Started to give verbal but electronically sent at this time.

## 2024-11-05 ENCOUNTER — Ambulatory Visit

## 2024-12-26 ENCOUNTER — Ambulatory Visit (HOSPITAL_COMMUNITY)
